# Patient Record
Sex: Female | Born: 1950 | Race: White | Hispanic: No | Smoking: Current every day smoker
Health system: Southern US, Community
[De-identification: ages and names within clinical notes are randomized; demographics above are authoritative.]

## PROBLEM LIST (undated history)

## (undated) DIAGNOSIS — M1990 Unspecified osteoarthritis, unspecified site: Secondary | ICD-10-CM

## (undated) DIAGNOSIS — F988 Other specified behavioral and emotional disorders with onset usually occurring in childhood and adolescence: Secondary | ICD-10-CM

## (undated) DIAGNOSIS — N183 Chronic kidney disease, stage 3 unspecified: Secondary | ICD-10-CM

## (undated) DIAGNOSIS — I10 Essential (primary) hypertension: Secondary | ICD-10-CM

## (undated) DIAGNOSIS — F32A Depression, unspecified: Secondary | ICD-10-CM

## (undated) DIAGNOSIS — R0600 Dyspnea, unspecified: Secondary | ICD-10-CM

## (undated) DIAGNOSIS — M810 Age-related osteoporosis without current pathological fracture: Secondary | ICD-10-CM

## (undated) DIAGNOSIS — IMO0002 Reserved for concepts with insufficient information to code with codable children: Secondary | ICD-10-CM

## (undated) DIAGNOSIS — F329 Major depressive disorder, single episode, unspecified: Secondary | ICD-10-CM

## (undated) DIAGNOSIS — R7303 Prediabetes: Secondary | ICD-10-CM

## (undated) DIAGNOSIS — T7840XA Allergy, unspecified, initial encounter: Secondary | ICD-10-CM

## (undated) DIAGNOSIS — J449 Chronic obstructive pulmonary disease, unspecified: Secondary | ICD-10-CM

## (undated) DIAGNOSIS — E785 Hyperlipidemia, unspecified: Secondary | ICD-10-CM

## (undated) DIAGNOSIS — Z974 Presence of external hearing-aid: Secondary | ICD-10-CM

## (undated) HISTORY — DX: Depression, unspecified: F32.A

## (undated) HISTORY — DX: Chronic obstructive pulmonary disease, unspecified: J44.9

## (undated) HISTORY — DX: Major depressive disorder, single episode, unspecified: F32.9

## (undated) HISTORY — PX: JOINT REPLACEMENT: SHX530

## (undated) HISTORY — DX: Allergy, unspecified, initial encounter: T78.40XA

## (undated) HISTORY — DX: Unspecified osteoarthritis, unspecified site: M19.90

## (undated) HISTORY — DX: Age-related osteoporosis without current pathological fracture: M81.0

## (undated) HISTORY — DX: Hyperlipidemia, unspecified: E78.5

## (undated) HISTORY — PX: MULTIPLE TOOTH EXTRACTIONS: SHX2053

## (undated) HISTORY — DX: Reserved for concepts with insufficient information to code with codable children: IMO0002

---

## 2001-03-10 ENCOUNTER — Emergency Department (HOSPITAL_COMMUNITY): Admission: EM | Admit: 2001-03-10 | Discharge: 2001-03-11 | Payer: Self-pay

## 2001-03-10 ENCOUNTER — Encounter: Payer: Self-pay | Admitting: Emergency Medicine

## 2002-10-11 ENCOUNTER — Encounter: Payer: Self-pay | Admitting: Emergency Medicine

## 2002-10-11 ENCOUNTER — Emergency Department (HOSPITAL_COMMUNITY): Admission: EM | Admit: 2002-10-11 | Discharge: 2002-10-12 | Payer: Self-pay | Admitting: Emergency Medicine

## 2003-11-04 ENCOUNTER — Emergency Department (HOSPITAL_COMMUNITY): Admission: EM | Admit: 2003-11-04 | Discharge: 2003-11-04 | Payer: Self-pay | Admitting: Emergency Medicine

## 2004-09-30 ENCOUNTER — Emergency Department (HOSPITAL_COMMUNITY): Admission: EM | Admit: 2004-09-30 | Discharge: 2004-09-30 | Payer: Self-pay | Admitting: Emergency Medicine

## 2006-04-26 ENCOUNTER — Ambulatory Visit (HOSPITAL_COMMUNITY): Admission: RE | Admit: 2006-04-26 | Discharge: 2006-04-26 | Payer: Self-pay | Admitting: Gastroenterology

## 2011-02-19 ENCOUNTER — Encounter (HOSPITAL_COMMUNITY)
Admission: RE | Admit: 2011-02-19 | Discharge: 2011-02-19 | Disposition: A | Discharge status: Home / Self Care | Payer: BC Managed Care – PPO | Source: Ambulatory Visit | Attending: Orthopedic Surgery | Admitting: Orthopedic Surgery

## 2011-02-19 ENCOUNTER — Ambulatory Visit (HOSPITAL_COMMUNITY)
Admission: RE | Admit: 2011-02-19 | Discharge: 2011-02-19 | Disposition: A | Discharge status: Home / Self Care | Payer: BC Managed Care – PPO | Source: Ambulatory Visit | Attending: Orthopedic Surgery | Admitting: Orthopedic Surgery

## 2011-02-19 ENCOUNTER — Other Ambulatory Visit (HOSPITAL_COMMUNITY): Payer: Self-pay | Admitting: Orthopedic Surgery

## 2011-02-19 DIAGNOSIS — M25569 Pain in unspecified knee: Secondary | ICD-10-CM | POA: Insufficient documentation

## 2011-02-19 DIAGNOSIS — Z01812 Encounter for preprocedural laboratory examination: Secondary | ICD-10-CM | POA: Insufficient documentation

## 2011-02-19 DIAGNOSIS — Z01818 Encounter for other preprocedural examination: Secondary | ICD-10-CM | POA: Insufficient documentation

## 2011-02-19 DIAGNOSIS — M25561 Pain in right knee: Secondary | ICD-10-CM

## 2011-02-19 DIAGNOSIS — Z01810 Encounter for preprocedural cardiovascular examination: Secondary | ICD-10-CM | POA: Insufficient documentation

## 2011-02-19 LAB — CBC
HCT: 40 % (ref 36.0–46.0)
MCH: 31.8 pg (ref 26.0–34.0)
MCV: 90.9 fL (ref 78.0–100.0)
RBC: 4.4 MIL/uL (ref 3.87–5.11)
WBC: 10.6 10*3/uL — ABNORMAL HIGH (ref 4.0–10.5)

## 2011-02-19 LAB — URINALYSIS, ROUTINE W REFLEX MICROSCOPIC
Bilirubin Urine: NEGATIVE
Glucose, UA: NEGATIVE mg/dL
Hgb urine dipstick: NEGATIVE
Ketones, ur: NEGATIVE mg/dL
Nitrite: NEGATIVE
Protein, ur: NEGATIVE mg/dL
Specific Gravity, Urine: 1.026 (ref 1.005–1.030)
Urobilinogen, UA: 0.2 mg/dL (ref 0.0–1.0)
pH: 5.5 (ref 5.0–8.0)

## 2011-02-19 LAB — BASIC METABOLIC PANEL
CO2: 25 mEq/L (ref 19–32)
Chloride: 102 mEq/L (ref 96–112)
Glucose, Bld: 81 mg/dL (ref 70–99)
Potassium: 4.1 mEq/L (ref 3.5–5.1)
Sodium: 139 mEq/L (ref 135–145)

## 2011-02-19 LAB — URINE MICROSCOPIC-ADD ON

## 2011-02-19 LAB — DIFFERENTIAL
Lymphocytes Relative: 24 % (ref 12–46)
Lymphs Abs: 2.6 10*3/uL (ref 0.7–4.0)
Monocytes Relative: 6 % (ref 3–12)
Neutrophils Relative %: 61 % (ref 43–77)

## 2011-02-19 LAB — SURGICAL PCR SCREEN
MRSA, PCR: NEGATIVE
Staphylococcus aureus: POSITIVE — AB

## 2011-02-19 LAB — APTT: aPTT: 32 seconds (ref 24–37)

## 2011-02-20 LAB — TYPE AND SCREEN
ABO/RH(D): O POS
Antibody Screen: NEGATIVE
Unit division: 0

## 2011-02-23 ENCOUNTER — Inpatient Hospital Stay (HOSPITAL_COMMUNITY)
Admission: RE | Admit: 2011-02-23 | Discharge: 2011-02-27 | DRG: 209 | Disposition: A | Discharge status: Home Health | Payer: BC Managed Care – PPO | Source: Ambulatory Visit | Attending: Orthopedic Surgery | Admitting: Orthopedic Surgery

## 2011-02-23 ENCOUNTER — Inpatient Hospital Stay (HOSPITAL_COMMUNITY): Payer: BC Managed Care – PPO

## 2011-02-23 DIAGNOSIS — Z01818 Encounter for other preprocedural examination: Secondary | ICD-10-CM

## 2011-02-23 DIAGNOSIS — E785 Hyperlipidemia, unspecified: Secondary | ICD-10-CM | POA: Diagnosis present

## 2011-02-23 DIAGNOSIS — I10 Essential (primary) hypertension: Secondary | ICD-10-CM | POA: Diagnosis present

## 2011-02-23 DIAGNOSIS — M1710 Unilateral primary osteoarthritis, unspecified knee: Principal | ICD-10-CM | POA: Diagnosis present

## 2011-02-23 DIAGNOSIS — R112 Nausea with vomiting, unspecified: Secondary | ICD-10-CM | POA: Diagnosis not present

## 2011-02-23 DIAGNOSIS — Z01812 Encounter for preprocedural laboratory examination: Secondary | ICD-10-CM

## 2011-02-23 DIAGNOSIS — F17200 Nicotine dependence, unspecified, uncomplicated: Secondary | ICD-10-CM | POA: Diagnosis present

## 2011-02-23 DIAGNOSIS — Z01810 Encounter for preprocedural cardiovascular examination: Secondary | ICD-10-CM

## 2011-02-23 DIAGNOSIS — E876 Hypokalemia: Secondary | ICD-10-CM | POA: Diagnosis not present

## 2011-02-24 LAB — CBC
HCT: 28.3 % — ABNORMAL LOW (ref 36.0–46.0)
Hemoglobin: 9.7 g/dL — ABNORMAL LOW (ref 12.0–15.0)
MCH: 31.1 pg (ref 26.0–34.0)
MCHC: 34.3 g/dL (ref 30.0–36.0)
MCV: 90.7 fL (ref 78.0–100.0)
Platelets: 187 K/uL (ref 150–400)
RBC: 3.12 MIL/uL — ABNORMAL LOW (ref 3.87–5.11)
RDW: 13.4 % (ref 11.5–15.5)
WBC: 7.1 K/uL (ref 4.0–10.5)

## 2011-02-24 LAB — BASIC METABOLIC PANEL WITH GFR
BUN: 15 mg/dL (ref 6–23)
CO2: 30 meq/L (ref 19–32)
Calcium: 7.7 mg/dL — ABNORMAL LOW (ref 8.4–10.5)
Chloride: 101 meq/L (ref 96–112)
Creatinine, Ser: 0.82 mg/dL (ref 0.50–1.10)
GFR calc Af Amer: >60 mL/min
GFR calc non Af Amer: >60 mL/min
Glucose, Bld: 127 mg/dL — ABNORMAL HIGH (ref 70–99)
Potassium: 3.3 meq/L — ABNORMAL LOW (ref 3.5–5.1)
Sodium: 139 meq/L (ref 135–145)

## 2011-02-24 LAB — PROTIME-INR
INR: 1.11 (ref 0.00–1.49)
Prothrombin Time: 14.5 s (ref 11.6–15.2)

## 2011-02-25 LAB — BASIC METABOLIC PANEL
BUN: 9 mg/dL (ref 6–23)
CO2: 32 mEq/L (ref 19–32)
Chloride: 98 mEq/L (ref 96–112)
Creatinine, Ser: 0.67 mg/dL (ref 0.50–1.10)

## 2011-02-25 LAB — CBC
HCT: 27.4 % — ABNORMAL LOW (ref 36.0–46.0)
Hemoglobin: 9.5 g/dL — ABNORMAL LOW (ref 12.0–15.0)
MCH: 31 pg (ref 26.0–34.0)
MCHC: 34.7 g/dL (ref 30.0–36.0)

## 2011-02-27 LAB — PROTIME-INR: Prothrombin Time: 23.3 seconds — ABNORMAL HIGH (ref 11.6–15.2)

## 2011-02-27 LAB — BASIC METABOLIC PANEL
GFR calc Af Amer: >60 mL/min (ref >60)
GFR calc non Af Amer: >60 mL/min (ref >60)
Potassium: 4.6 mEq/L (ref 3.5–5.1)
Sodium: 140 mEq/L (ref 135–145)

## 2011-02-27 LAB — HEMOGLOBIN AND HEMATOCRIT, BLOOD: HCT: 26.5 % — ABNORMAL LOW (ref 36.0–46.0)

## 2011-03-09 NOTE — Discharge Summary (Signed)
  NAMECAYLYN, TEDESCHI NO.:  [id number]  MEDICAL RECORD NO.:  [id number]  LOCATION:  5036                         FACILITY:  [facility]  PHYSICIAN:  Almedia Balls. Ranell Patrick, M.D. DATE OF BIRTH:  Jun 27, 1951  DATE OF ADMISSION:  02/23/2011 DATE OF DISCHARGE:  02/27/2011                              DISCHARGE SUMMARY   ADMISSION DIAGNOSIS:  Right knee end-stage osteoarthritis.  DISCHARGE DIAGNOSIS:  Right knee end-stage osteoarthritis.  BRIEF HISTORY:  The patient is a 60 year old female with worsening right knee pain secondary to end-stage osteoarthritis.  The patient has elected to have a total knee arthroplasty, decreased pain and increased function of that right knee.  PROCEDURE:  The patient had a right total knee arthroplasty by Dr. Malon Kindle on February 23, 2011.  Assistant was [employer], PA-C. General anesthesia was used.  No complications.  HOSPITAL COURSE:  The patient was admitted on February 23, 2011, for the above-stated procedure which she tolerated well.  After adequate time in postanesthesia care unit, she was transferred up to 5000 on postop day 1.  The patient complained of only minimal pain in that right knee, but she did have some moderate nausea.  She did have nausea that lasted for about 2 days, questionable reaction to some of the narcotics versus anesthesia, but on postop day 3 and 4, she was able to work quite well with physical therapy, was moving about quite easily with only minimal- to-no pain in that right knee.  Overall, she was doing great, labs were within acceptable limits, and her wound was healing well.  Overall, her hospital stay was uneventful.  DISCHARGE PLAN:  The patient will be discharged home on February 27, 2011.  CONDITION:  Stable.  DIET:  Regular.  The patient does have allergy to CODEINE.  DISCHARGE MEDICATIONS: 1. Celebrex 200 mg daily. 2. Prozac 20 mg daily. 3. Hydrochlorothiazide 12.5 mg daily. 4. Robaxin 500 mg p.o.  q.6 h. 5. Crestor 20 mg at bedtime. 6. Tramadol 50 mg p.o. b.i.d. p.r.n. 7. Coumadin per pharmacy protocol. 8. Percocet 5/325 one to two tablets q.4-6 h. p.r.n. pain. 9. Trazodone 50 mg at bedtime.  FOLLOWUP:  The patient will follow back up with Dr. Malon Kindle in 2 weeks.     Thomas B. Dixon, P.A.   ______________________________ Almedia Balls. Ranell Patrick, M.D.    TBD/MEDQ  D:  02/27/2011  T:  02/27/2011  Job:  [id number]  Electronically Signed by Standley Dakins P.A. on 03/08/2011 09:03:19 AM Electronically Signed by Malon Kindle  on 03/09/2011 12:08:37 AM

## 2011-03-09 NOTE — Op Note (Signed)
NAMEMARIEL, [name] NO.:  [id number]  MEDICAL RECORD NO.:  [id number]  LOCATION:  5036                         FACILITY:  [facility]  PHYSICIAN:  Almedia Balls. Ranell Patrick, M.D. DATE OF BIRTH:  August 17, 1951  DATE OF PROCEDURE:  02/23/2011 DATE OF DISCHARGE:                              OPERATIVE REPORT   PREOPERATIVE DIAGNOSIS:  Right knee end-stage osteoarthritis.  POSTOPERATIVE DIAGNOSIS:  Right knee end-stage osteoarthritis.  PROCEDURE PERFORMED:  Right total knee replacement using DePuy sigma rotating platform prosthesis.  SURGEON:  Almedia Balls. Ranell Patrick, MD  ASSISTANT:  Donnie Coffin. Dixon, PA-C  ANESTHESIA:  General anesthesia was used plus femoral block.  ESTIMATED BLOOD LOSS:  Minimal.  FLUID REPLACEMENT:  1200 mL crystalloid.  URINE OUTPUT:  50 mL.  INSTRUMENT COUNT:  Correct.  COMPLICATIONS:  None.  Preoperative antibiotics were given.  INDICATIONS:  The patient is a 60 year old female with worsening rightknee pain secondary to knee arthritis.  The patient has had progressive osteoarthritis including medial joint space loss.  The patient has failed conservative management and desires operative treatment to restore function and eliminate pain.  Informed consent obtained.  DESCRIPTION OF PROCEDURE:  After an adequate level of anesthesia was achieved, the patient positioned in the supine position.  The right leg was correctly identified and a nonsterile tourniquet placed in the right proximal thigh.  Right leg was then sterilely prepped and draped in the usual sterile manner.  We exsanguinated the limb using Esmarch bandage and elevated the tourniquet to 300 mmHg.  The midline incision was created with the knee in flexion.  Medial parapatellar arthrotomy was developed and applied with fresh knife.  Patella everted.  The lateral patellofemoral ligaments were divided.  We then entered the distal femur using a step-cut drill.  We then placed a intramedullary  distal femoral resection guide set on 5 degrees right with 10-mm resection.  We resected with an oscillating saw and then sized the femur to size 2 anterior down and then placed our formalin cutting block for anterior- posterior and chamfer cuts.  Once those were done, we resected ACL, PCL and meniscal tissue and this subluxed the tibia anteriorly.  We then cut the tibia 90 degrees perpendicular long axis of tibia with minimal posterior slope and this posterior cruciate substituting prosthesis.  We resected 2-mm off the affected medial side.  Next, we went and checked our gaps which was symmetric at 10-mm.  Posterior bone was resected with 1-inch curved osteotomes.  We next went ahead and finished our tibial preparation with a modular drill and keel punch for a 2.5 tibia and then the femur was prepared with the box cutting guide resecting the box for the posterior cruciate substituting prosthesis.  We then inserted the 2 femur right impacting that position and placed a 12.5 trial and reduced the knee.  We were happy with our soft tissue balance.  We then resurfaced the patella going from a size 22 down to a size 15 patella and then used the 32 patellar button and drilled our lug holes for that and I then placed the 32 patellar trial.  We then arranged the knee and had nice full range of  motion with full extension and normal patellar tracking with no-touch technique.  We then removed all trial components, pulse irrigated the entire knee and then cemented the components into place with DePuy high viscosity cement.  The knee was placed in extension with a trial 12.5 polyethylene while the cement hardened and once the cement was hard, we removed the excess cement with Cornish curved osteotome thoroughly pulse irrigated the knee and then placed a real 12.5 polyethylene insert into position.  This was a size 2 matching the femoral size to reduce the knee and again happy with balanced  soft tissue to get full extension, stable in flexion and extension.  We then irrigated the knee one last time and closed the knee with the knee in slight flexion with #1 Vicryl suture for the parapatellar arthrotomy followed by 2-0 Vicryl subcutaneous closure and 4-0 Monocryl for skin. Steri-Strips applied followed by sterile dressing.  The patient tolerated the surgery well.     Almedia Balls. Ranell Patrick, M.D.     SRN/MEDQ  D:  02/23/2011  T:  02/24/2011  Job:  [id number]  Electronically Signed by Malon Kindle  on 03/09/2011 12:08:34 AM

## 2011-03-09 NOTE — H&P (Signed)
  NAME:  Sheryl [name], Sheryl [name] NO.:  [id number]  MEDICAL RECORD NO.:  [id number]  LOCATION:                                 FACILITY:  PHYSICIAN:  Almedia Balls. Ranell Patrick, M.D. DATE OF BIRTH:  07-05-1951  DATE OF ADMISSION: DATE OF DISCHARGE:                             HISTORY & PHYSICAL   CHIEF COMPLAINT:  Right knee pain.  HISTORY OF PRESENT ILLNESS:  The patient is a 59 year old female with worsening right knee pain secondary to end-stage osteoarthritis.  The patient is elected to have right total knee arthroplasty to decrease pain and increase function.  PAST MEDICAL HISTORY:  Hypertension, bronchitis, and hyperlipidemia.  FAMILY MEDICAL HISTORY:  Negative.  SOCIAL HISTORY:  The patient does not smoke or use alcohol.  The patient is of Dr. Merla Riches.  DRUG ALLERGIES:  None.  CURRENT MEDICATIONS:  Simvastatin, Ultram, and Prozac.  REVIEW OF SYSTEMS:  Pain on range of motion, ambulation with the right knee.  PHYSICAL EXAMINATION:  VITAL SIGNS:  Pulse 70, respirations 16, blood pressure 132/80. GENERAL:  The patient is healthy-appearing 60 year old female, in no acute distress.  Pleasant mood and affect.  Oriented x3. HEAD AND NECK:  Shows cranial nerves II-XII grossly intact.  She has full range of motion in the cervical spine without any tenderness or problems. CHEST:  Active breath sounds bilaterally.  No wheezes, rhonchi, or rales. HEART:  Shows regular rate and rhythm.  No murmur. ABDOMEN:  Nontender, nondistended with active bowel sounds. EXTREMITIES:  Shows moderate tenderness of the right knee, especially medial joint line with mild effusion.  Mildly antalgic gait. Neurovascularly, she is intact.  Distally, no pedal edema or rashes.  X-rays show end-stage osteoarthritis of the right knee.  IMPRESSION:  End-stage osteoarthritis, right knee.  PLAN OF ACTION:  Right total knee arthroplasty by Dr. Malon Kindle.     Thomas B. Dixon,  P.A.   ______________________________ Almedia Balls. Ranell Patrick, M.D.    TBD/MEDQ  D:  02/01/2011  T:  02/02/2011  Job:  [id number]  Electronically Signed by Standley Dakins P.A. on 02/13/2011 08:32:38 AM Electronically Signed by Malon Kindle  on 03/09/2011 12:08:30 AM

## 2011-06-07 ENCOUNTER — Emergency Department (HOSPITAL_COMMUNITY)
Admission: EM | Admit: 2011-06-07 | Discharge: 2011-06-08 | Disposition: A | Discharge status: Home / Self Care | Payer: BC Managed Care – PPO | Attending: Emergency Medicine | Admitting: Emergency Medicine

## 2011-06-07 DIAGNOSIS — T1500XA Foreign body in cornea, unspecified eye, initial encounter: Secondary | ICD-10-CM | POA: Insufficient documentation

## 2011-06-07 DIAGNOSIS — H5710 Ocular pain, unspecified eye: Secondary | ICD-10-CM | POA: Insufficient documentation

## 2011-06-07 DIAGNOSIS — T1590XA Foreign body on external eye, part unspecified, unspecified eye, initial encounter: Secondary | ICD-10-CM | POA: Insufficient documentation

## 2011-08-21 HISTORY — PX: REPLACEMENT TOTAL KNEE: SUR1224

## 2011-08-23 ENCOUNTER — Ambulatory Visit (INDEPENDENT_AMBULATORY_CARE_PROVIDER_SITE_OTHER): Payer: BC Managed Care – PPO

## 2011-08-23 DIAGNOSIS — I10 Essential (primary) hypertension: Secondary | ICD-10-CM

## 2011-08-23 DIAGNOSIS — E785 Hyperlipidemia, unspecified: Secondary | ICD-10-CM

## 2011-08-23 DIAGNOSIS — N390 Urinary tract infection, site not specified: Secondary | ICD-10-CM

## 2011-08-28 ENCOUNTER — Ambulatory Visit (INDEPENDENT_AMBULATORY_CARE_PROVIDER_SITE_OTHER): Payer: BC Managed Care – PPO

## 2011-08-28 DIAGNOSIS — R7401 Elevation of levels of liver transaminase levels: Secondary | ICD-10-CM

## 2011-08-28 DIAGNOSIS — Z139 Encounter for screening, unspecified: Secondary | ICD-10-CM

## 2011-08-28 DIAGNOSIS — R300 Dysuria: Secondary | ICD-10-CM

## 2011-10-31 ENCOUNTER — Other Ambulatory Visit: Payer: Self-pay | Admitting: Internal Medicine

## 2011-11-29 ENCOUNTER — Other Ambulatory Visit: Payer: Self-pay | Admitting: Physician Assistant

## 2012-01-16 ENCOUNTER — Other Ambulatory Visit: Payer: Self-pay | Admitting: Internal Medicine

## 2012-03-07 ENCOUNTER — Other Ambulatory Visit: Payer: Self-pay | Admitting: Family Medicine

## 2012-04-24 ENCOUNTER — Other Ambulatory Visit: Payer: Self-pay | Admitting: Internal Medicine

## 2012-04-24 NOTE — Telephone Encounter (Signed)
Chart pulled to PA pool at nurse station [phone]

## 2012-04-24 NOTE — Telephone Encounter (Signed)
Rx authorized.  Please advise patient that she needs OV for additional refills.

## 2012-04-24 NOTE — Telephone Encounter (Signed)
Please pull chart.

## 2012-04-25 NOTE — Telephone Encounter (Signed)
[facility] notifying patient rx sent in and to RTC for more.

## 2012-04-29 NOTE — Progress Notes (Signed)
Received notice from [employer] that pt's prior auth for pt's Strattera Rx was approved through 04/28/13. Faxed approval notice to [facility]

## 2012-06-04 ENCOUNTER — Ambulatory Visit: Payer: BC Managed Care – PPO

## 2012-06-04 ENCOUNTER — Ambulatory Visit (INDEPENDENT_AMBULATORY_CARE_PROVIDER_SITE_OTHER): Payer: BC Managed Care – PPO | Admitting: Internal Medicine

## 2012-06-04 ENCOUNTER — Other Ambulatory Visit: Payer: Self-pay | Admitting: Family Medicine

## 2012-06-04 ENCOUNTER — Encounter: Payer: Self-pay | Admitting: Internal Medicine

## 2012-06-04 VITALS — BP 110/78 | HR 78 | Temp 98.0°F | Resp 16 | Ht 61.0 in | Wt 139.0 lb

## 2012-06-04 DIAGNOSIS — M25511 Pain in right shoulder: Secondary | ICD-10-CM

## 2012-06-04 DIAGNOSIS — G4700 Insomnia, unspecified: Secondary | ICD-10-CM

## 2012-06-04 DIAGNOSIS — I10 Essential (primary) hypertension: Secondary | ICD-10-CM

## 2012-06-04 DIAGNOSIS — J309 Allergic rhinitis, unspecified: Secondary | ICD-10-CM | POA: Insufficient documentation

## 2012-06-04 DIAGNOSIS — R05 Cough: Secondary | ICD-10-CM

## 2012-06-04 DIAGNOSIS — F32A Depression, unspecified: Secondary | ICD-10-CM | POA: Insufficient documentation

## 2012-06-04 DIAGNOSIS — M79601 Pain in right arm: Secondary | ICD-10-CM

## 2012-06-04 DIAGNOSIS — R059 Cough, unspecified: Secondary | ICD-10-CM

## 2012-06-04 DIAGNOSIS — L259 Unspecified contact dermatitis, unspecified cause: Secondary | ICD-10-CM

## 2012-06-04 DIAGNOSIS — Z202 Contact with and (suspected) exposure to infections with a predominantly sexual mode of transmission: Secondary | ICD-10-CM

## 2012-06-04 DIAGNOSIS — E785 Hyperlipidemia, unspecified: Secondary | ICD-10-CM

## 2012-06-04 DIAGNOSIS — F329 Major depressive disorder, single episode, unspecified: Secondary | ICD-10-CM | POA: Insufficient documentation

## 2012-06-04 DIAGNOSIS — F17200 Nicotine dependence, unspecified, uncomplicated: Secondary | ICD-10-CM

## 2012-06-04 DIAGNOSIS — M79609 Pain in unspecified limb: Secondary | ICD-10-CM

## 2012-06-04 DIAGNOSIS — L309 Dermatitis, unspecified: Secondary | ICD-10-CM

## 2012-06-04 DIAGNOSIS — F988 Other specified behavioral and emotional disorders with onset usually occurring in childhood and adolescence: Secondary | ICD-10-CM | POA: Insufficient documentation

## 2012-06-04 DIAGNOSIS — Z23 Encounter for immunization: Secondary | ICD-10-CM

## 2012-06-04 DIAGNOSIS — F3289 Other specified depressive episodes: Secondary | ICD-10-CM

## 2012-06-04 LAB — CBC WITH DIFFERENTIAL/PLATELET
Basophils Relative: 1 % (ref 0–1)
Eosinophils Absolute: 0.6 10*3/uL (ref 0.0–0.7)
Eosinophils Relative: 11 % — ABNORMAL HIGH (ref 0–5)
Lymphs Abs: 1.7 10*3/uL (ref 0.7–4.0)
MCH: 30.9 pg (ref 26.0–34.0)
MCHC: 33.3 g/dL (ref 30.0–36.0)
MCV: 92.6 fL (ref 78.0–100.0)
Monocytes Relative: 4 % (ref 3–12)
Neutrophils Relative %: 54 % (ref 43–77)
Platelets: 323 10*3/uL (ref 150–400)
RBC: 4.18 MIL/uL (ref 3.87–5.11)

## 2012-06-04 LAB — LIPID PANEL
Cholesterol: 166 mg/dL (ref 0–200)
HDL: 44 mg/dL (ref >39)
LDL Cholesterol: 100 mg/dL — ABNORMAL HIGH (ref 0–99)
Total CHOL/HDL Ratio: 3.8 Ratio
Triglycerides: 108 mg/dL (ref <150)
VLDL: 22 mg/dL (ref 0–40)

## 2012-06-04 LAB — COMPREHENSIVE METABOLIC PANEL
Alkaline Phosphatase: 135 U/L — ABNORMAL HIGH (ref 39–117)
CO2: 28 mEq/L (ref 19–32)
Creat: 1.02 mg/dL (ref 0.50–1.10)
Glucose, Bld: 92 mg/dL (ref 70–99)
Sodium: 141 mEq/L (ref 135–145)
Total Bilirubin: 0.4 mg/dL (ref 0.3–1.2)

## 2012-06-04 MED ORDER — CLOBETASOL PROPIONATE 0.05 % EX CREA: TOPICAL_CREAM | Freq: Two times a day (BID) | CUTANEOUS | Status: DC

## 2012-06-04 MED ORDER — LOSARTAN POTASSIUM-HCTZ 100-12.5 MG PO TABS: 1.0000 | ORAL_TABLET | Freq: Every day | ORAL | Status: DC

## 2012-06-04 MED ORDER — MELOXICAM 15 MG PO TABS: 15.0000 mg | ORAL_TABLET | Freq: Every day | ORAL | Status: DC

## 2012-06-04 MED ORDER — TRAZODONE HCL 50 MG PO TABS
50.0000 mg | ORAL_TABLET | Freq: Every day | ORAL | Status: DC
Start: 2012-06-04 — End: 2013-07-19

## 2012-06-04 MED ORDER — ATOMOXETINE HCL 100 MG PO CAPS: 100.0000 mg | ORAL_CAPSULE | Freq: Every day | ORAL | Status: DC

## 2012-06-04 MED ORDER — ATORVASTATIN CALCIUM 40 MG PO TABS: 40.0000 mg | ORAL_TABLET | Freq: Every day | ORAL | Status: DC

## 2012-06-04 MED ORDER — FLUOXETINE HCL 40 MG PO CAPS: 40.0000 mg | ORAL_CAPSULE | Freq: Every day | ORAL | Status: DC

## 2012-06-04 NOTE — Patient Instructions (Addendum)
[facility]---google for glucosamine Valerian root for sleep Exercises for shoulder Naphcon or vasocon for eyes

## 2012-06-04 NOTE — Progress Notes (Deleted)
  Subjective:    Patient ID: Sheryl [name], female    DOB: 22-Apr-1951, 61 y.o.   MRN: [id number]  HPI    Review of Systems     Objective:   Physical Exam        Assessment & Plan:

## 2012-06-04 NOTE — Progress Notes (Signed)
Subjective:    Patient ID: Sheryl [name], female    DOB: 03/21/1951, 61 y.o.   MRN: [id number]  CC: 61 yo WF presents to office for refills on meds.  HPI The pt. Has several complaints today in addition to needing refills.   She would like to quit smoking and has a chronic cough.  She has cut down from 1 ppd to 1/2 ppd and purchased an electronic cigarette.  She has family members who quit smoking and are encouraging her to do the same.  She says that she "would like to be done with it."  In the past she tried Chantix.  It caused her to have nausea and weird dreams.  She says the dreams are tolerable, but the nausea was because she was not drinking enough water.  She requests a pap smear and STI testing.  She was here in the spring and tested positive for chlamydia.  She finished treatment, but since has had new sexual partners and does not use protection.  She does not have any symptoms that are concerning, but would like testing to confirm. Her last pap smear was in 2008.  She has never had abnormal pap smears.  She is postmenopausal.  She c/o chronic R arm pain that has not resolved over the past 1 yr.  She describes the pain as "sore," worse with movement (especially when stretching her R arm across her chest,) and when laying on her R side.  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    Pt. Stated that overall she is doing well.  She has had a few crying spells over the past couple of days since running out of her Prozac last week.  However, she expects to return to her normal mood after restarting the medicine.  She is enjoying her new partner, he is fun and treats her well.    PMHX:   Patient Active Problem List  Diagnosis  . HTN (hypertension)  . Hyperlipidemia  . Depression  . ADD (attention deficit disorder)  . AR (allergic rhinitis)   SxHx: R knee sx w/in past yr.  Review of Systems Positives: General: Cold feet//trouble staying  asleep HEENT: Dry mouth, dry eyes, seasonal allergies w/ congestion  Respiratory: Chronic cough MSK: Arthritis GI: Occasional stress incontinence     Objective:   Physical Exam General: 61 yo WF oriented and cooperative Vitals:  Filed Vitals:   06/04/12 1046  BP: 110/78  Pulse: 78  Temp: 98 F (36.7 C)  Resp: 16  HEENT: nontraumatic, EOMIT, erythema in ear canal, nasal turbinates pink, anterior chain LN, thyroid without nodules, trachea midline Heart: RRR/no m Lungs: Hyperresonant lung sounds, no wheezing Abdomen: full contour, BS present, nontender/soft to palpation MSK: normal bulk and tone.  R UE: Full ROM throughout w/pain at insertion of SITS's m. on shoulder ADDuction and end point during passive ABduction, tender to palpation at insertion of SITS  M., strength 5/5  Neuro: alert, oriented, CN II-XII grossly IT Skin: several 2x2cm areas of excoriated eczema on R LE Breast:No masses Pelvic: Ext. Genitalia-normal to inspection w/o lesions, Vagina-pink, moist, Cervix: pink, no lesions, OS friable,  Bimanual Exam-uterus small/NT, no adnexal masses or tenderness   IMAGES: CXR R Shoulder XR  [facility] reading (PRIMARY) by  Dr. 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 w/Chronic Cough  10) Exposure to STD   Plan:  She will schedule mammogram and consider DEXA scan 1)*D/C Celebrex and started Mobic due to expense  Meds ordered this encounter  Medications  . DISCONTD: FLUoxetine (PROZAC) 40 MG capsule    Sig: Take 40 mg by mouth daily.  . celecoxib (CELEBREX) 200 MG capsule    Sig: Take 200 mg by mouth 2 (two) times daily.  Marland Kitchen DISCONTD: clobetasol cream (TEMOVATE) 0.05 %    Sig: Apply  topically 2 (two) times daily.    Dispense:  30 g    Refill:  0  . traZODone (DESYREL) 50 MG tablet    Sig: Take 1 tablet (50 mg total) by mouth at bedtime.    Dispense:  90 tablet    Refill:  3  . losartan-hydrochlorothiazide (HYZAAR) 100-12.5 MG per tablet    Sig: Take 1 tablet by mouth daily.    Dispense:  90 tablet    Refill:  3  . FLUoxetine (PROZAC) 40 MG capsule    Sig: Take 1 capsule (40 mg total) by mouth daily.    Dispense:  90 capsule    Refill:  3  . clobetasol cream (TEMOVATE) 0.05 %    Sig: Apply topically 2 (two) times daily.    Dispense:  30 g    Refill:  5  . atorvastatin (LIPITOR) 40 MG tablet    Sig: Take 1 tablet (40 mg total) by mouth daily.    Dispense:  90 tablet    Refill:  1  . atomoxetine (STRATTERA) 100 MG capsule    Sig: Take 1 capsule (100 mg total) by mouth daily.    Dispense:  30 capsule    Refill:  11    PT IS OUT OF REFILLS  . meloxicam (MOBIC) 15 MG tablet    Sig: Take 1 tablet (15 mg total) by mouth daily.    Dispense:  90 tablet    Refill:  3   2) Labs: CBC, CMP, Lipid panel, TSH 3) Pap smear w/ HPV, Chlamydia, and GC testing 4) CXR 5) R shoulder XR 6) R shoulder exercises and counseling regarding herbal supplementation for jt.Discussed tumeric and cherry juice extract 7) Flu vaccine 8) DISCUSSED SMOKING CESSATION OPTIONS -will reinforce with letter Office visit 50 minutes  Addendum 10/18-labs Results for orders placed in visit on 06/04/12  CBC WITH DIFFERENTIAL      Component Value Range   WBC 5.8  4.0 - 10.5 K/uL   RBC 4.18  3.87 - 5.11 MIL/uL   Hemoglobin 12.9  12.0 - 15.0 g/dL   HCT 78.2  95.6 - 21.3 %   MCV 92.6  78.0 - 100.0 fL   MCH 30.9  26.0 - 34.0 pg   MCHC 33.3  30.0 - 36.0 g/dL   RDW 08.6  57.8 - 46.9 %   Platelets 323  150 - 400 K/uL   Neutrophils Relative 54  43 - 77 %   Neutro Abs 3.2  1.7 - 7.7 K/uL   Lymphocytes Relative 30  12 - 46 %   Lymphs Abs 1.7  0.7 - 4.0 K/uL   Monocytes Relative 4  3 - 12 %    Monocytes Absolute 0.3  0.1 - 1.0 K/uL   Eosinophils Relative 11 (*) 0 - 5 %   Eosinophils Absolute 0.6  0.0 - 0.7 K/uL   Basophils Relative 1  0 - 1 %   Basophils Absolute 0.1  0.0 - 0.1 K/uL   Smear Review Criteria for review not met    COMPREHENSIVE METABOLIC PANEL      Component Value Range   Sodium 141  135 - 145 mEq/L   Potassium 4.6  3.5 - 5.3 mEq/L   Chloride 108  96 - 112 mEq/L   CO2 28  19 - 32 mEq/L   Glucose, Bld 92  70 - 99 mg/dL   BUN 17  6 - 23 mg/dL   Creat 1.61  0.96 - 0.45 mg/dL   Total Bilirubin 0.4  0.3 - 1.2 mg/dL   Alkaline Phosphatase 135 (*) 39 - 117 U/L   AST 26  0 - 37 U/L   ALT 31  0 - 35 U/L   Total Protein 6.9  6.0 - 8.3 g/dL   Albumin 4.0  3.5 - 5.2 g/dL   Calcium 9.4  8.4 - 40.9 mg/dL  LIPID PANEL      Component Value Range   Cholesterol 166  0 - 200 mg/dL   Triglycerides 811  <914 mg/dL   HDL 44  >78 mg/dL   Total CHOL/HDL Ratio 3.8     VLDL 22  0 - 40 mg/dL   LDL Cholesterol 295 (*) 0 - 99 mg/dL  TSH      Component Value Range   TSH 0.731  0.350 - 4.500 uIU/mL  PAP IG, CT-NG NAA, HPV HIGH-RISK      Component Value Range   HPV DNA High Risk       Specimen adequacy:       FINAL DIAGNOSIS:       COMMENTS:       RECOMMENDATIONS:       Cytotechnologist:       QC Cytotechnologist:       Pathologist:       Chlamydia Probe Amp NEGATIVE     GC Probe Amp NEGATIVE     Awaiting rest of PAP/She needs Pap smear with HPV testing every 5 years in her age group

## 2012-06-06 ENCOUNTER — Encounter: Payer: Self-pay | Admitting: Internal Medicine

## 2012-06-06 DIAGNOSIS — F17200 Nicotine dependence, unspecified, uncomplicated: Secondary | ICD-10-CM | POA: Insufficient documentation

## 2012-06-06 LAB — PAP IG, CT-NG NAA, HPV HIGH-RISK
Chlamydia Probe Amp: NEGATIVE
GC Probe Amp: NEGATIVE

## 2012-06-16 ENCOUNTER — Encounter: Payer: Self-pay | Admitting: Internal Medicine

## 2012-12-31 ENCOUNTER — Ambulatory Visit (INDEPENDENT_AMBULATORY_CARE_PROVIDER_SITE_OTHER): Payer: BC Managed Care – PPO | Admitting: Family Medicine

## 2012-12-31 ENCOUNTER — Ambulatory Visit: Payer: BC Managed Care – PPO

## 2012-12-31 DIAGNOSIS — R031 Nonspecific low blood-pressure reading: Secondary | ICD-10-CM

## 2012-12-31 DIAGNOSIS — M1990 Unspecified osteoarthritis, unspecified site: Secondary | ICD-10-CM

## 2012-12-31 DIAGNOSIS — IMO0002 Reserved for concepts with insufficient information to code with codable children: Secondary | ICD-10-CM

## 2012-12-31 DIAGNOSIS — I10 Essential (primary) hypertension: Secondary | ICD-10-CM

## 2012-12-31 DIAGNOSIS — M79609 Pain in unspecified limb: Secondary | ICD-10-CM

## 2012-12-31 MED ORDER — TRAMADOL HCL 50 MG PO TABS: 50.0000 mg | ORAL_TABLET | Freq: Three times a day (TID) | ORAL | Status: DC | PRN

## 2012-12-31 NOTE — Progress Notes (Signed)
Subjective: Patient slipped or tripped in her yard last night and fell landing on her left shoulder somehow. She does not seem to know exactly how she hit, but she had acute pain in the left upper arm just below the shoulder. She continues to have pain especially in the posterior aspect of the arm in the deltoid region.  Patient is concerned about her low blood pressure today. We discussed the medication she is on.  She also complains of generalized arthritis. She says the meloxicam   Objective: Arm is tender below the shoulder. Shoulder has good range of motion when moved passively, but when she actively tries to move it hurts. Grip is good. Pulses good. Neurovascular intact.  [facility] reading (PRIMARY) by  Dr. Alwyn Ren Normal upper arm and shoulder.    Assessment: Strain left shoulder Low blood pressure and osteoarthritis  Plan: Anti-inflammatory medication, ice, time Take Tylenol for the pain in addition to the meloxicam Cut her blood pressure pill in half.

## 2012-12-31 NOTE — Patient Instructions (Signed)
Continue the meloxicam. You are on the maximum dose of that.  Take Tylenol 500 mg 2 tablets twice daily for your arthritis pain in addition to the meloxicam  Use ice on the shoulder.  Cut blood pressure pills in half and take one half daily  Drink plenty of fluids  Monitor your blood pressure, if it continues to stay low or if it starts going to high come back in for recheck.

## 2013-02-07 ENCOUNTER — Other Ambulatory Visit: Payer: Self-pay | Admitting: Internal Medicine

## 2013-05-06 NOTE — Progress Notes (Signed)
PA approved for Straterra through 05/06/14. Notified pharmacy.

## 2013-05-24 ENCOUNTER — Other Ambulatory Visit: Payer: Self-pay | Admitting: Internal Medicine

## 2013-06-10 ENCOUNTER — Other Ambulatory Visit: Payer: Self-pay | Admitting: Internal Medicine

## 2013-07-04 ENCOUNTER — Other Ambulatory Visit: Payer: Self-pay | Admitting: Internal Medicine

## 2013-07-19 ENCOUNTER — Ambulatory Visit (INDEPENDENT_AMBULATORY_CARE_PROVIDER_SITE_OTHER): Payer: BC Managed Care – PPO | Admitting: Internal Medicine

## 2013-07-19 VITALS — BP 122/70 | HR 92 | Temp 98.3°F | Resp 18 | Ht 61.0 in | Wt 139.0 lb

## 2013-07-19 DIAGNOSIS — F329 Major depressive disorder, single episode, unspecified: Secondary | ICD-10-CM

## 2013-07-19 DIAGNOSIS — F988 Other specified behavioral and emotional disorders with onset usually occurring in childhood and adolescence: Secondary | ICD-10-CM

## 2013-07-19 DIAGNOSIS — M25562 Pain in left knee: Secondary | ICD-10-CM

## 2013-07-19 DIAGNOSIS — E785 Hyperlipidemia, unspecified: Secondary | ICD-10-CM

## 2013-07-19 DIAGNOSIS — G4700 Insomnia, unspecified: Secondary | ICD-10-CM

## 2013-07-19 DIAGNOSIS — I10 Essential (primary) hypertension: Secondary | ICD-10-CM

## 2013-07-19 DIAGNOSIS — F17200 Nicotine dependence, unspecified, uncomplicated: Secondary | ICD-10-CM

## 2013-07-19 DIAGNOSIS — Z23 Encounter for immunization: Secondary | ICD-10-CM

## 2013-07-19 DIAGNOSIS — F32A Depression, unspecified: Secondary | ICD-10-CM

## 2013-07-19 LAB — POCT CBC
Hemoglobin: 13.2 g/dL (ref 12.2–16.2)
MCH, POC: 30.5 pg (ref 27–31.2)
MCV: 98.2 fL — AB (ref 80–97)
MID (cbc): 1 — AB (ref 0–0.9)
Platelet Count, POC: 267 10*3/uL (ref 142–424)
RBC: 4.33 M/uL (ref 4.04–5.48)
WBC: 9.3 10*3/uL (ref 4.6–10.2)

## 2013-07-19 LAB — COMPREHENSIVE METABOLIC PANEL
BUN: 14 mg/dL (ref 6–23)
CO2: 27 mEq/L (ref 19–32)
Calcium: 9.6 mg/dL (ref 8.4–10.5)
Chloride: 103 mEq/L (ref 96–112)
Creat: 1.04 mg/dL (ref 0.50–1.10)
Glucose, Bld: 92 mg/dL (ref 70–99)

## 2013-07-19 LAB — LIPID PANEL
Cholesterol: 180 mg/dL (ref 0–200)
HDL: 56 mg/dL (ref >39)
Total CHOL/HDL Ratio: 3.2 Ratio
Triglycerides: 97 mg/dL (ref <150)

## 2013-07-19 MED ORDER — LOSARTAN POTASSIUM-HCTZ 100-12.5 MG PO TABS: 1.0000 | ORAL_TABLET | Freq: Every day | ORAL | Status: DC

## 2013-07-19 MED ORDER — ZOSTER VACCINE LIVE 19400 UNT/0.65ML ~~LOC~~ SOLR: 0.6500 mL | Freq: Once | SUBCUTANEOUS | Status: DC

## 2013-07-19 MED ORDER — ATOMOXETINE HCL 100 MG PO CAPS: ORAL_CAPSULE | ORAL | Status: DC

## 2013-07-19 MED ORDER — MELOXICAM 15 MG PO TABS: ORAL_TABLET | ORAL | Status: DC

## 2013-07-19 MED ORDER — ATORVASTATIN CALCIUM 40 MG PO TABS: ORAL_TABLET | ORAL | Status: DC

## 2013-07-19 MED ORDER — FLUOXETINE HCL 40 MG PO CAPS: 40.0000 mg | ORAL_CAPSULE | Freq: Every day | ORAL | Status: DC

## 2013-07-19 MED ORDER — TRAZODONE HCL 50 MG PO TABS: 50.0000 mg | ORAL_TABLET | Freq: Every day | ORAL | Status: DC

## 2013-07-19 NOTE — Progress Notes (Addendum)
Subjective:    Patient ID: Sheryl [name], female    DOB: 1950-09-12, 62 y.o.   MRN: [id number]  This chart was scribed for Ellamae Sia, MD by Greggory Stallion, Medical Scribe. This patient's care was started at 10:31 AM.  HPI HPI Comments: Sheryl [name] is a 62 y.o. female who presents to the office complaining of a fall that occurred yesterday. She states she tripped over concrete blocks and landed on her left knee. Pt has sudden onset, constant left knee pain and an abrasion over her left knee. Pain radiates into her left thigh. Bearing weight and straightening her knee worsen the pain.   Pt states she also needs refills on her medications and her cholesterol checked. She also needs the flu and pneumonia vaccinations. Pt states she is still smoking about 12 cigarettes daily.   Patient Active Problem List   Diagnosis Date Noted  . Nicotine addiction---at 12 cigs a day//has quit before /chantix no help//would like to quit 06/06/2012  . HTN (hypertension) 06/04/2012  . Hyperlipidemia---no wt loss 06/04/2012  . Depression---stable on meds 06/04/2012  . ADD (attention deficit disorder)---stable on strattera 06/04/2012  . AR (allergic rhinitis) 06/04/2012   Past Medical History  Diagnosis Date  . Allergy   . Arthritis   . Osteoporosis    Past Surgical History  Procedure Laterality Date  . Joint replacement R Knee     No Known Allergies Prior to Admission medications   Medication Sig Start Date End Date Taking? Authorizing Provider  atorvastatin (LIPITOR) 40 MG tablet TAKE 1 TABLET EVERY DAY 02/07/13  Yes Godfrey Pick, PA-C  celecoxib (CELEBREX) 200 MG capsule Take 200 mg by mouth 2 (two) times daily.   Yes Historical Provider, MD  clobetasol cream (TEMOVATE) 0.05 % Apply topically 2 (two) times daily. 06/04/12  Yes Tonye Pearson, MD  FLUoxetine (PROZAC) 40 MG capsule Take 1 capsule (40 mg total) by mouth daily. PATIENT NEEDS OFFICE VISIT FOR ADDITIONAL REFILLS  05/24/13  Yes Tonye Pearson, MD  losartan-hydrochlorothiazide [facility]) 100-12.5 MG per tablet Take 1 tablet by mouth daily. PATIENT NEEDS OFFICE VISIT FOR ADDITIONAL REFILLS 06/10/13  Yes Godfrey Pick, PA-C  meloxicam (MOBIC) 15 MG tablet TAKE 1 TABLET EVERY DAY 07/04/13  Yes Godfrey Pick, PA-C  STRATTERA 100 MG capsule TAKE ONE CAPSULE EVERY DAY 07/04/13  Yes Eleanore Delia Chimes, PA-C  traMADol (ULTRAM) 50 MG tablet Take 1 tablet (50 mg total) by mouth every 8 (eight) hours as needed for pain. 12/31/12  Yes Peyton Najjar, MD  traZODone (DESYREL) 50 MG tablet Take 1 tablet (50 mg total) by mouth at bedtime. 06/04/12  Yes Tonye Pearson, MD   History   Social History  . Marital Status: Divorced    Spouse Name: N/A    Number of Children: N/A  . Years of Education: N/A   Occupational History  . Not on file.   Social History Main Topics  . Smoking status: Current Every Day Smoker  . Smokeless tobacco: Not on file  . Alcohol Use: No  . Drug Use: No  . Sexual Activity: Yes   Other Topics Concern  . Not on file   Social History Narrative  . No narrative on file    Review of Systems  Constitutional: Negative for fever, activity change, appetite change, fatigue and unexpected weight change.  Eyes: Negative for visual disturbance.  Respiratory: Negative for shortness of breath.   Cardiovascular: Negative for chest pain,  palpitations and leg swelling.  Gastrointestinal: Negative for abdominal pain.  Musculoskeletal: Positive for arthralgias and myalgias.  Skin: Positive for wound.  Neurological: Negative for headaches.  Psychiatric/Behavioral: Negative for behavioral problems, sleep disturbance and dysphoric mood.       Objective:   Physical Exam  Vitals reviewed. Constitutional: She is oriented to person, place, and time. She appears well-developed and well-nourished. No distress.  HENT:  Head: Normocephalic and atraumatic.  Eyes: EOM are normal. Pupils are equal,  round, and reactive to light.  Neck: Neck supple. No thyromegaly present.  Cardiovascular: Normal rate, regular rhythm and normal heart sounds.   No murmur heard. Pulmonary/Chest: Effort normal and breath sounds normal. No respiratory distress. She has no wheezes. She has no rales.  Musculoskeletal: Normal range of motion. She exhibits no edema.  Left knee mildly swollen without effusion. Tender on medial collateral ligament with palpation and stress. Tender in upper gastroc without a defect. Tender in distal posterior thigh. Abrasion over lateral patella which moves freely without pain. Drawer negative.   Lymphadenopathy:    She has no cervical adenopathy.  Neurological: She is alert and oriented to person, place, and time.  Skin: Skin is warm and dry.  Psychiatric: She has a normal mood and affect. Her behavior is normal.    Filed Vitals:   07/19/13 1000  BP: 122/70  Pulse: 92  Temp: 98.3 F (36.8 C)  TempSrc: Oral  Resp: 18  Height: 5\' 1"  (1.549 m)  Weight: 139 lb (63.05 kg)  SpO2: 98%       Assessment & Plan:  10:43 AM-Discussed treatment plan which includes Tylenol and ice with pt at bedside and pt agreed to plan. Will also do blood work.    I have completed the patient encounter in its entirety as documented by the scribe, with editing by me where necessary. Kendall Arnell P. Merla Riches, M.D. Hyperlipidemia - Plan: POCT CBC, Comprehensive metabolic panel, Lipid panel  HTN (hypertension) - Plan: POCT CBC, Comprehensive metabolic panel, Lipid panel  Nicotine addiction--5 min disc grad cessation with subst gum  Depression-stable  ADD (attention deficit disorder)-stable  Insomnia - Plan: traZODone (DESYREL) 50 MG tablet  Knee pain abrasion/MCL startin/gastroc str   ice/stretch/slow Incr ambul  Meds ordered this encounter  Medications  . atorvastatin (LIPITOR) 40 MG tablet    Sig: TAKE 1 TABLET EVERY DAY    Dispense:  90 tablet    Refill:  3  . FLUoxetine (PROZAC) 40 MG  capsule    Sig: Take 1 capsule (40 mg total) by mouth daily.    Dispense:  90 capsule    Refill:  3  . losartan-hydrochlorothiazide (HYZAAR) 100-12.5 MG per tablet    Sig: Take 1 tablet by mouth daily.    Dispense:  90 tablet    Refill:  3  . meloxicam (MOBIC) 15 MG tablet    Sig: TAKE 1 TABLET EVERY DAY    Dispense:  90 tablet    Refill:  3  . atomoxetine (STRATTERA) 100 MG capsule    Sig: TAKE ONE CAPSULE EVERY DAY    Dispense:  90 capsule    Refill:  3  . traZODone (DESYREL) 50 MG tablet    Sig: Take 1 tablet (50 mg total) by mouth at bedtime.    Dispense:  90 tablet    Refill:  3  . zoster vaccine live, PF, (ZOSTAVAX) 78295 UNT/0.65ML injection    Sig: Inject 19,400 Units into the skin once. Administer at pharmacy  Dispense:  1 each    Refill:  0   Routine labs

## 2013-07-24 ENCOUNTER — Encounter: Payer: Self-pay | Admitting: Internal Medicine

## 2013-08-11 ENCOUNTER — Ambulatory Visit: Payer: BC Managed Care – PPO

## 2013-08-11 ENCOUNTER — Ambulatory Visit (INDEPENDENT_AMBULATORY_CARE_PROVIDER_SITE_OTHER): Payer: BC Managed Care – PPO | Admitting: Family Medicine

## 2013-08-11 VITALS — BP 110/66 | HR 94 | Temp 98.0°F | Resp 18 | Ht 61.0 in | Wt 138.0 lb

## 2013-08-11 DIAGNOSIS — J029 Acute pharyngitis, unspecified: Secondary | ICD-10-CM

## 2013-08-11 DIAGNOSIS — J019 Acute sinusitis, unspecified: Secondary | ICD-10-CM

## 2013-08-11 DIAGNOSIS — R059 Cough, unspecified: Secondary | ICD-10-CM

## 2013-08-11 DIAGNOSIS — R52 Pain, unspecified: Secondary | ICD-10-CM

## 2013-08-11 DIAGNOSIS — R05 Cough: Secondary | ICD-10-CM

## 2013-08-11 LAB — POCT RAPID STREP A (OFFICE): Rapid Strep A Screen: NEGATIVE

## 2013-08-11 LAB — POCT INFLUENZA A/B
Influenza A, POC: NEGATIVE
Influenza B, POC: NEGATIVE

## 2013-08-11 MED ORDER — HYDROCODONE-HOMATROPINE 5-1.5 MG/5ML PO SYRP: 5.0000 mL | ORAL_SOLUTION | Freq: Every evening | ORAL | Status: DC | PRN

## 2013-08-11 MED ORDER — BENZONATATE 100 MG PO CAPS: 200.0000 mg | ORAL_CAPSULE | Freq: Two times a day (BID) | ORAL | Status: DC | PRN

## 2013-08-11 MED ORDER — AMOXICILLIN-POT CLAVULANATE 875-125 MG PO TABS: 1.0000 | ORAL_TABLET | Freq: Two times a day (BID) | ORAL | Status: DC

## 2013-08-11 MED ORDER — FLUTICASONE PROPIONATE 50 MCG/ACT NA SUSP: 2.0000 | Freq: Every day | NASAL | Status: DC

## 2013-08-11 NOTE — Patient Instructions (Signed)

## 2013-08-11 NOTE — Progress Notes (Signed)
Chief Complaint:  Chief Complaint  Patient presents with  . Sore Throat    x2 days   . right eye was matted    x2 days   . Plantar Fasciitis    HPI: Sheryl [name] is a 62 y.o. female who is here for a sore throat that she has been having since yesterday. She is  having trouble with losing her voice. It hurts to swallow. Pt states that her right ear feels like it is stopped up. She woke up this morning and her right eye was closed shut until she washed her face. Nasal congestion and productive cough. She took Dayquil with some relief. She has had the flu vaccine She is a smoker  Past Medical History  Diagnosis Date  . Allergy   . Arthritis   . Osteoporosis    Past Surgical History  Procedure Laterality Date  . Joint replacement     History   Social History  . Marital Status: Divorced    Spouse Name: N/A    Number of Children: N/A  . Years of Education: N/A   Social History Main Topics  . Smoking status: Current Every Day Smoker  . Smokeless tobacco: None  . Alcohol Use: No  . Drug Use: No  . Sexual Activity: Yes   Other Topics Concern  . None   Social History Narrative  . None   History reviewed. No pertinent family history. No Known Allergies Prior to Admission medications   Medication Sig Start Date End Date Taking? Authorizing Provider  atomoxetine (STRATTERA) 100 MG capsule TAKE ONE CAPSULE EVERY DAY 07/19/13  Yes Tonye Pearson, MD  atorvastatin (LIPITOR) 40 MG tablet TAKE 1 TABLET EVERY DAY 07/19/13  Yes Tonye Pearson, MD  clobetasol cream (TEMOVATE) 0.05 % Apply topically 2 (two) times daily. 06/04/12  Yes Tonye Pearson, MD  FLUoxetine (PROZAC) 40 MG capsule Take 1 capsule (40 mg total) by mouth daily. 07/19/13  Yes Tonye Pearson, MD  losartan-hydrochlorothiazide (HYZAAR) 100-12.5 MG per tablet Take 1 tablet by mouth daily. 07/19/13  Yes Tonye Pearson, MD  meloxicam (MOBIC) 15 MG tablet TAKE 1 TABLET EVERY DAY 07/19/13   Yes Tonye Pearson, MD  traZODone (DESYREL) 50 MG tablet Take 1 tablet (50 mg total) by mouth at bedtime. 07/19/13  Yes Tonye Pearson, MD  zoster vaccine live, PF, (ZOSTAVAX) [id number] UNT/0.65ML injection Inject 19,400 Units into the skin once. Administer at pharmacy 07/19/13   Tonye Pearson, MD     ROS: The patient denies fevers, chills, night sweats, unintentional weight loss, chest pain, palpitations, wheezing, dyspnea on exertion, nausea, vomiting, abdominal pain, dysuria, hematuria, melena, numbness, weakness, or tingling.   All other systems have been reviewed and were otherwise negative with the exception of those mentioned in the HPI and as above.    PHYSICAL EXAM: Filed Vitals:   08/11/13 1659  BP: 110/66  Pulse: 94  Temp: 98 F (36.7 C)  Resp: 18   Filed Vitals:   08/11/13 1659  Height: 5\' 1"  (1.549 m)  Weight: 138 lb (62.596 kg)   Body mass index is 26.09 kg/(m^2).  General: Alert, no acute distress HEENT:  Normocephalic, atraumatic, oropharynx patent. EOMI, PERRLA, TM nl , + sinus tenderness, erythematous throat Cardiovascular:  Regular rate and rhythm, no rubs murmurs or gallops.  No Carotid bruits, radial pulse intact. No pedal edema.  Respiratory: Clear to auscultation bilaterally.  No wheezes, rales, or rhonchi.  No cyanosis, no use of accessory musculature GI: No organomegaly, abdomen is soft and non-tender, positive bowel sounds.  No masses. Skin: No rashes. Neurologic: Facial musculature symmetric. Psychiatric: Patient is appropriate throughout our interaction. Lymphatic: No cervical lymphadenopathy Musculoskeletal: Gait intact.   LABS: Results for orders placed in visit on 08/11/13  POCT INFLUENZA A/B      Result Value Range   Influenza A, POC Negative     Influenza B, POC Negative    POCT RAPID STREP A (OFFICE)      Result Value Range   Rapid Strep A Screen Negative  Negative     EKG/XRAY:   Primary read interpreted by Dr. Conley Rolls at  [facility]   ASSESSMENT/PLAN: Encounter Diagnoses  Name Primary?  . Acute pharyngitis Yes  . Body aches   . Cough   . Acute sinusitis     Rx Augmentin, flonase and also tessaolon perles. Try sxs treatment first and if no response then will  Gross sideeffects, risk and benefits, and alternatives of medications d/w patient. Patient is aware that all medications have potential sideeffects and we are unable to predict every sideeffect or drug-drug interaction that may occur.  Hamilton Capri PHUONG, DO 08/11/2013 6:59 PM

## 2013-08-14 ENCOUNTER — Other Ambulatory Visit: Payer: Self-pay | Admitting: Internal Medicine

## 2013-08-27 ENCOUNTER — Ambulatory Visit: Payer: BC Managed Care – PPO

## 2013-08-27 ENCOUNTER — Ambulatory Visit (INDEPENDENT_AMBULATORY_CARE_PROVIDER_SITE_OTHER): Payer: BC Managed Care – PPO | Admitting: Family Medicine

## 2013-08-27 VITALS — BP 120/62 | HR 89 | Temp 97.5°F | Resp 16 | Ht 61.0 in | Wt 139.4 lb

## 2013-08-27 DIAGNOSIS — R05 Cough: Secondary | ICD-10-CM

## 2013-08-27 DIAGNOSIS — R059 Cough, unspecified: Secondary | ICD-10-CM

## 2013-08-27 DIAGNOSIS — J019 Acute sinusitis, unspecified: Secondary | ICD-10-CM

## 2013-08-27 DIAGNOSIS — R062 Wheezing: Secondary | ICD-10-CM

## 2013-08-27 DIAGNOSIS — F17200 Nicotine dependence, unspecified, uncomplicated: Secondary | ICD-10-CM

## 2013-08-27 DIAGNOSIS — Z720 Tobacco use: Secondary | ICD-10-CM

## 2013-08-27 LAB — BASIC METABOLIC PANEL
BUN: 20 mg/dL (ref 6–23)
CHLORIDE: 101 meq/L (ref 96–112)
CO2: 25 mEq/L (ref 19–32)
Calcium: 9.1 mg/dL (ref 8.4–10.5)
Creat: 1.01 mg/dL (ref 0.50–1.10)
Glucose, Bld: 63 mg/dL — ABNORMAL LOW (ref 70–99)
Potassium: 4.5 mEq/L (ref 3.5–5.3)
Sodium: 139 mEq/L (ref 135–145)

## 2013-08-27 LAB — POCT CBC
Granulocyte percent: 68 %G (ref 37–80)
HCT, POC: 39.1 % (ref 37.7–47.9)
Hemoglobin: 12.1 g/dL — AB (ref 12.2–16.2)
Lymph, poc: 2.6 (ref 0.6–3.4)
MCH, POC: 30.3 pg (ref 27–31.2)
MCHC: 30.9 g/dL — AB (ref 31.8–35.4)
MCV: 98 fL — AB (ref 80–97)
MID (cbc): 0.8 (ref 0–0.9)
MPV: 8.2 fL (ref 0–99.8)
POC Granulocyte: 7.3 — AB (ref 2–6.9)
POC LYMPH PERCENT: 24.2 %L (ref 10–50)
POC MID %: 7.8 %M (ref 0–12)
Platelet Count, POC: 349 10*3/uL (ref 142–424)
RBC: 3.99 M/uL — AB (ref 4.04–5.48)
RDW, POC: 13.2 %
WBC: 10.8 10*3/uL — AB (ref 4.6–10.2)

## 2013-08-27 MED ORDER — DOXYCYCLINE HYCLATE 100 MG PO CAPS: 100.0000 mg | ORAL_CAPSULE | Freq: Two times a day (BID) | ORAL | Status: DC

## 2013-08-27 MED ORDER — FLUTICASONE PROPIONATE 50 MCG/ACT NA SUSP: 2.0000 | Freq: Every day | NASAL | Status: DC

## 2013-08-27 MED ORDER — ALBUTEROL SULFATE HFA 108 (90 BASE) MCG/ACT IN AERS: 2.0000 | INHALATION_SPRAY | Freq: Four times a day (QID) | RESPIRATORY_TRACT | Status: DC | PRN

## 2013-08-27 MED ORDER — HYDROCODONE-HOMATROPINE 5-1.5 MG/5ML PO SYRP: 5.0000 mL | ORAL_SOLUTION | Freq: Three times a day (TID) | ORAL | Status: DC | PRN

## 2013-08-27 MED ORDER — PREDNISONE 20 MG PO TABS: ORAL_TABLET | ORAL | Status: DC

## 2013-08-27 NOTE — Patient Instructions (Signed)
Use the prednisone and doxycycline as directed.   Use the albuterol inhaler as needed for cough and wheezing.  You can also use the cough syrup as needed; remember it will make you sleepy so do not use it when you need to drive.    Let me know if you are not getting better soon- Sooner if worse.

## 2013-08-27 NOTE — Progress Notes (Signed)
[facility] [address], [place] [address] [phone]  Date:  08/27/2013   Name:  Sheryl [name]   DOB:  02-09-1951   MRN:  [id number]  PCP:  Leandrew Koyanagi, MD    Chief Complaint: Cough   History of Present Illness:  Sheryl [name] is a 63 y.o. very pleasant female patient who presents with the following:  She is here today to follow-up recent illness She was seen on 12/23 with ST and cough- she was tx with agumentin,hycodan, flonase and tessalon.  She did get better for awhile but was never 100%.   A few days ago she started sneezing.  Then last night she noted return of her cough- she is coughing up green material.   She does have some ST- not as bad as before.  Ears feels congested.   She has laryngitis.  She notes sinus pressure.  She has not noted a fever.  No chills or aches.   No GI symptoms.    She is a smoker.  She is trying to cut back and is now down to < 1/2 PPD  Patient Active Problem List   Diagnosis Date Noted  . Nicotine addiction 06/06/2012  . HTN (hypertension) 06/04/2012  . Hyperlipidemia 06/04/2012  . Depression 06/04/2012  . ADD (attention deficit disorder) 06/04/2012  . AR (allergic rhinitis) 06/04/2012    Past Medical History  Diagnosis Date  . Allergy   . Arthritis   . Osteoporosis     Past Surgical History  Procedure Laterality Date  . Joint replacement      History  Substance Use Topics  . Smoking status: Current Every Day Smoker  . Smokeless tobacco: Not on file  . Alcohol Use: No    No family history on file.  No Known Allergies  Medication list has been reviewed and updated.  Current Outpatient Prescriptions on File Prior to Visit  Medication Sig Dispense Refill  . atomoxetine (STRATTERA) 100 MG capsule TAKE ONE CAPSULE EVERY DAY  90 capsule  3  . atorvastatin (LIPITOR) 40 MG tablet TAKE 1 TABLET EVERY DAY  90 tablet  3  . clobetasol cream (TEMOVATE) 0.05 % Apply topically 2 (two) times  daily.  30 g  5  . FLUoxetine (PROZAC) 40 MG capsule Take 1 capsule (40 mg total) by mouth daily.  90 capsule  3  . HYDROcodone-homatropine (HYCODAN) 5-1.5 MG/5ML syrup Take 5 mLs by mouth at bedtime as needed for cough.  120 mL  0  . losartan-hydrochlorothiazide (HYZAAR) 100-12.5 MG per tablet Take 1 tablet by mouth daily.  90 tablet  3  . meloxicam (MOBIC) 15 MG tablet TAKE 1 TABLET EVERY DAY  90 tablet  3  . traZODone (DESYREL) 50 MG tablet TAKE 1 TABLET AT BEDTIME  90 tablet  3  . zoster vaccine live, PF, (ZOSTAVAX) [id number] UNT/0.65ML injection Inject 19,400 Units into the skin once. Administer at pharmacy  1 each  0  . traZODone (DESYREL) 50 MG tablet Take 1 tablet (50 mg total) by mouth at bedtime.  90 tablet  3   No current facility-administered medications on file prior to visit.    Review of Systems:  As per HPI- otherwise negative.   Physical Examination: Filed Vitals:   08/27/13 1418  BP: 120/62  Pulse: 89  Temp: 97.5 F (36.4 C)  Resp: 16   Filed Vitals:   08/27/13 1418  Height: 5\' 1"  (1.549 m)  Weight: 139  lb 6.4 oz (63.231 kg)   Body mass index is 26.35 kg/(m^2). Ideal Body Weight: Weight in (lb) to have BMI = 25: 132  GEN: WDWN, NAD, Non-toxic, A & O x 3 HEENT: Atraumatic, Normocephalic. Neck supple. No masses, No LAD.  Bilateral TM wnl, oropharynx normal.  PEERL,EOMI.   Ears and Nose: No external deformity. CV: RRR, No M/G/R. No JVD. No thrill. No extra heart sounds. PULM: CTA B, no crackles, rhonchi. No retractions. No resp. distress. No accessory muscle use. Mild expiratory wheezing.  ABD: S, NT, ND, +BS. No rebound. No HSM. EXTR: No c/c/e NEURO Normal gait.  PSYCH: Normally interactive. Conversant. Not depressed or anxious appearing.  Calm demeanor.   [facility] reading (PRIMARY) by  Dr. Lorelei Pont. CXR: negative  CHEST 2 VIEW  COMPARISON: 06/04/2012  FINDINGS: The heart size and mediastinal contours are within normal limits. Both lungs are clear. The  visualized skeletal structures are unremarkable.  IMPRESSION: No active cardiopulmonary disease.   Results for orders placed in visit on 08/27/13  POCT CBC      Result Value Range   WBC 10.8 (*) 4.6 - 10.2 K/uL   Lymph, poc 2.6  0.6 - 3.4   POC LYMPH PERCENT 24.2  10 - 50 %L   MID (cbc) 0.8  0 - 0.9   POC MID % 7.8  0 - 12 %M   POC Granulocyte 7.3 (*) 2 - 6.9   Granulocyte percent 68.0  37 - 80 %G   RBC 3.99 (*) 4.04 - 5.48 M/uL   Hemoglobin 12.1 (*) 12.2 - 16.2 g/dL   HCT, POC 39.1  37.7 - 47.9 %   MCV 98.0 (*) 80 - 97 fL   MCH, POC 30.3  27 - 31.2 pg   MCHC 30.9 (*) 31.8 - 35.4 g/dL   RDW, POC 13.2     Platelet Count, POC 349  142 - 424 K/uL   MPV 8.2  0 - 99.8 fL    Assessment and Plan: Cough - Plan: POCT CBC, DG Chest 2 View, predniSONE (DELTASONE) 20 MG tablet, Basic metabolic panel, HYDROcodone-homatropine (HYCODAN) 5-1.5 MG/5ML syrup  Tobacco abuse  Wheezing - Plan: albuterol (PROVENTIL HFA;VENTOLIN HFA) 108 (90 BASE) MCG/ACT inhaler, doxycycline (VIBRAMYCIN) 100 MG capsule  Acute sinusitis - Plan: fluticasone (FLONASE) 50 MCG/ACT nasal spray  encouraged her to stop smoking.  Treat current cough and bronchospasm with prednisone, albuterol, flonase, doxycyline.  Refilled her hycodan syrup as well  Signed Lamar Blinks, MD

## 2013-08-28 ENCOUNTER — Telehealth: Payer: Self-pay | Admitting: Family Medicine

## 2013-08-28 ENCOUNTER — Encounter: Payer: Self-pay | Admitting: Family Medicine

## 2013-08-28 NOTE — Telephone Encounter (Signed)
Called to check on her today- she is doing well.  Reminded her to avoid all NSAID products including her mobic and OTCs while she is on prednisone

## 2013-10-21 ENCOUNTER — Ambulatory Visit (INDEPENDENT_AMBULATORY_CARE_PROVIDER_SITE_OTHER): Payer: BC Managed Care – PPO | Admitting: Family Medicine

## 2013-10-21 VITALS — BP 100/70 | HR 105 | Temp 98.1°F | Resp 16 | Ht 60.5 in | Wt 139.0 lb

## 2013-10-21 DIAGNOSIS — Z720 Tobacco use: Secondary | ICD-10-CM

## 2013-10-21 DIAGNOSIS — R05 Cough: Secondary | ICD-10-CM

## 2013-10-21 DIAGNOSIS — R062 Wheezing: Secondary | ICD-10-CM

## 2013-10-21 DIAGNOSIS — J9801 Acute bronchospasm: Secondary | ICD-10-CM

## 2013-10-21 DIAGNOSIS — R059 Cough, unspecified: Secondary | ICD-10-CM

## 2013-10-21 DIAGNOSIS — F17200 Nicotine dependence, unspecified, uncomplicated: Secondary | ICD-10-CM

## 2013-10-21 DIAGNOSIS — J0100 Acute maxillary sinusitis, unspecified: Secondary | ICD-10-CM

## 2013-10-21 MED ORDER — ALBUTEROL SULFATE HFA 108 (90 BASE) MCG/ACT IN AERS: 1.0000 | INHALATION_SPRAY | Freq: Four times a day (QID) | RESPIRATORY_TRACT | Status: DC | PRN

## 2013-10-21 MED ORDER — AMOXICILLIN-POT CLAVULANATE 875-125 MG PO TABS: 1.0000 | ORAL_TABLET | Freq: Two times a day (BID) | ORAL | Status: DC

## 2013-10-21 NOTE — Progress Notes (Signed)
Subjective:    Patient ID: Sheryl [name], female    DOB: [birth year], 63 y.o.   MRN: [id number]  HPI Sheryl [name] is a 63 y.o. female PCP: Leandrew Koyanagi, MD  Last seen 08/27/13 by Dr. Lorelei Pont - Treated sinusitis, cough, bronchospasm with prednisone, albuterol, flonase, doxycyline.  Refilled her hycodan syrup as well that visit.   Today - c/o - feels like ears never unstopped since last ov.  Tried netipot - on R yesterday - more pain than usual, and drew some tears.  Yellow and green d/c then from R side. Ears still feels stopped up more on R than L.  No fever.  Some cough, but more nasal d/c for past 2-3 weeks. Not using flonase ns recently - once last week.  Used more initially - not a lot - didn't know what it was for. Has been albuterol up to twice per day, but not everyday. Ran out last week. Feels pnd triggering cough.  Breathing ok today- baseline. Sore to cough. Short of breath with mvmt - no recent change. Teeth hurt on upper R side. Blowing clear d/c for a few weeks until yesterday.   Smoker - 5-6 per day recently, prior 1ppd since 63 yo - about 35 pack year hx.   Feels like prednisone caused sores on face.    Going to [employer] this weekend.    Hx of tinnitus for years - no recent changes.     Patient Active Problem List   Diagnosis Date Noted  . Nicotine addiction 06/06/2012  . HTN (hypertension) 06/04/2012  . Hyperlipidemia 06/04/2012  . Depression 06/04/2012  . ADD (attention deficit disorder) 06/04/2012  . AR (allergic rhinitis) 06/04/2012   Past Medical History  Diagnosis Date  . Allergy   . Arthritis   . Osteoporosis    Past Surgical History  Procedure Laterality Date  . Joint replacement     No Known Allergies Prior to Admission medications   Medication Sig Start Date End Date Taking? Authorizing Provider  albuterol (PROVENTIL HFA;VENTOLIN HFA) 108 (90 BASE) MCG/ACT inhaler Inhale 2 puffs into the lungs every 6 (six) hours as needed for  wheezing or shortness of breath. 08/27/13  Yes Gay Filler Copland, MD  atomoxetine (STRATTERA) 100 MG capsule TAKE ONE CAPSULE EVERY DAY 07/19/13  Yes Leandrew Koyanagi, MD  atorvastatin (LIPITOR) 40 MG tablet TAKE 1 TABLET EVERY DAY 07/19/13  Yes Leandrew Koyanagi, MD  clobetasol cream (TEMOVATE) 0.05 % Apply topically 2 (two) times daily. 06/04/12  Yes Leandrew Koyanagi, MD  FLUoxetine (PROZAC) 40 MG capsule Take 1 capsule (40 mg total) by mouth daily. 07/19/13  Yes Leandrew Koyanagi, MD  fluticasone (FLONASE) 50 MCG/ACT nasal spray Place 2 sprays into both nostrils daily. 08/27/13  Yes Gay Filler Copland, MD  HYDROcodone-homatropine (HYCODAN) 5-1.5 MG/5ML syrup Take 5 mLs by mouth every 8 (eight) hours as needed for cough. 08/27/13  Yes Gay Filler Copland, MD  losartan-hydrochlorothiazide (HYZAAR) 100-12.5 MG per tablet Take 1 tablet by mouth daily. 07/19/13  Yes Leandrew Koyanagi, MD  meloxicam (MOBIC) 15 MG tablet TAKE 1 TABLET EVERY DAY 07/19/13  Yes Leandrew Koyanagi, MD  traZODone (DESYREL) 50 MG tablet TAKE 1 TABLET AT BEDTIME 08/14/13  Yes Chelle S Jeffery, PA-C  doxycycline (VIBRAMYCIN) 100 MG capsule Take 1 capsule (100 mg total) by mouth 2 (two) times daily. 08/27/13   Gay Filler Copland, MD  predniSONE (DELTASONE) 20 MG tablet Take 2 pills a  day for 3 days, then 1 pill a day for 3 days 08/27/13   Darreld Mclean, MD   History   Social History  . Marital Status: Divorced    Spouse Name: N/A    Number of Children: N/A  . Years of Education: N/A   Occupational History  . Not on file.   Social History Main Topics  . Smoking status: Current Every Day Smoker  . Smokeless tobacco: Not on file  . Alcohol Use: No  . Drug Use: No  . Sexual Activity: Yes   Other Topics Concern  . Not on file   Social History Narrative  . No narrative on file       Review of Systems  Constitutional: Negative for fever and chills.  HENT: Positive for sinus pressure. Negative for nosebleeds and  sore throat.   Respiratory: Positive for cough and shortness of breath (as above.).   Neurological: Positive for headaches.  other as above.      Objective:   Physical Exam  Vitals reviewed. Constitutional: She is oriented to person, place, and time. She appears well-developed and well-nourished. No distress.  HENT:  Head: Normocephalic and atraumatic.  Right Ear: Hearing, external ear and ear canal normal. Tympanic membrane is not erythematous and not retracted. A middle ear effusion (min clear fluid behind L tm., min cerumen in R canal o/w clear. ) is present.  Left Ear: Hearing, tympanic membrane, external ear and ear canal normal. Tympanic membrane is not erythematous and not retracted.  Nose: Right sinus exhibits maxillary sinus tenderness. Right sinus exhibits no frontal sinus tenderness. Left sinus exhibits no maxillary sinus tenderness and no frontal sinus tenderness.  Mouth/Throat: Oropharynx is clear and moist. No oropharyngeal exudate.  Eyes: Conjunctivae and EOM are normal. Pupils are equal, round, and reactive to light.  Cardiovascular: Normal rate, regular rhythm, normal heart sounds and intact distal pulses.   No murmur heard. Pulmonary/Chest: Effort normal and breath sounds normal. No respiratory distress. She has no wheezes. She has no rhonchi.  Lungs clear.   Neurological: She is alert and oriented to person, place, and time.  Skin: Skin is warm and dry. No rash noted.  Psychiatric: She has a normal mood and affect. Her behavior is normal.   Filed Vitals:   10/21/13 1246  BP: 100/70  Pulse: 105  Temp: 98.1 F (36.7 C)  Resp: 16      Assessment & Plan:   [name] is a 63 y.o. female Tobacco abuse, Cough,  Hx of Bronchospasm - significant tobacco history, suspect possible COPD. Phone number given to smoking cessation clinic and advised quitting which she is amenable to. Refilled albuterol if need but if frequent use to return for recheck. Also recommended  follow up with PCP in next month to discuss further.   Sinusitis, acute maxillary - Plan: amoxicillin-clavulanate (AUGMENTIN) 875-125 MG per tablet. Suspect component of chronic sinusitis with secondary acute right maxillary as secondary sickening and tooth pain. Start antibiotic, continue saline nasal spray and recommended restart Flonase. rtc precautions given.    Meds ordered this encounter  Medications  . amoxicillin-clavulanate (AUGMENTIN) 875-125 MG per tablet    Sig: Take 1 tablet by mouth 2 (two) times daily.    Dispense:  20 tablet    Refill:  0  . albuterol (PROVENTIL HFA;VENTOLIN HFA) 108 (90 BASE) MCG/ACT inhaler    Sig: Inhale 1-2 puffs into the lungs every 6 (six) hours as needed for wheezing or shortness of  breath.    Dispense:  1 Inhaler    Refill:  0   Patient Instructions  Start the antibiotic as discussed, saline nasal spray throughout the day and restart flonase to help with pressure in ear. albuterol if needed for wheezing, but return to discuss this further as you may have some signs of COPD.  If ears not improved in next few weeks, or worsening symptoms sooner - return here or emergency room.  Return to the clinic or go to the nearest emergency room if any of your symptoms worsen or new symptoms occur. [facility] offers smoking cessation clinics. Registration is required. To register call [phone] or register online at [email or web address]  Sinusitis Sinusitis is redness, soreness, and swelling (inflammation) of the paranasal sinuses. Paranasal sinuses are air pockets within the bones of your face (beneath the eyes, the middle of the forehead, or above the eyes). In healthy paranasal sinuses, mucus is able to drain out, and air is able to circulate through them by way of your nose. However, when your paranasal sinuses are inflamed, mucus and air can become trapped. This can allow bacteria and other germs to grow and cause infection. Sinusitis can develop quickly and  last only a short time (acute) or continue over a long period (chronic). Sinusitis that lasts for more than 12 weeks is considered chronic.  CAUSES  Causes of sinusitis include:  Allergies.  Structural abnormalities, such as displacement of the cartilage that separates your nostrils (deviated septum), which can decrease the air flow through your nose and sinuses and affect sinus drainage.  Functional abnormalities, such as when the small hairs (cilia) that line your sinuses and help remove mucus do not work properly or are not present. SYMPTOMS  Symptoms of acute and chronic sinusitis are the same. The primary symptoms are pain and pressure around the affected sinuses. Other symptoms include:  Upper toothache.  Earache.  Headache.  Bad breath.  Decreased sense of smell and taste.  A cough, which worsens when you are lying flat.  Fatigue.  Fever.  Thick drainage from your nose, which often is green and may contain pus (purulent).  Swelling and warmth over the affected sinuses. DIAGNOSIS  Your caregiver will perform a physical exam. During the exam, your caregiver may:  Look in your nose for signs of abnormal growths in your nostrils (nasal polyps).  Tap over the affected sinus to check for signs of infection.  View the inside of your sinuses (endoscopy) with a special imaging device with a light attached (endoscope), which is inserted into your sinuses. If your caregiver suspects that you have chronic sinusitis, one or more of the following tests may be recommended:  Allergy tests.  Nasal culture A sample of mucus is taken from your nose and sent to a lab and screened for bacteria.  Nasal cytology A sample of mucus is taken from your nose and examined by your caregiver to determine if your sinusitis is related to an allergy. TREATMENT  Most cases of acute sinusitis are related to a viral infection and will resolve on their own within 10 days. Sometimes medicines are  prescribed to help relieve symptoms (pain medicine, decongestants, nasal steroid sprays, or saline sprays).  However, for sinusitis related to a bacterial infection, your caregiver will prescribe antibiotic medicines. These are medicines that will help kill the bacteria causing the infection.  Rarely, sinusitis is caused by a fungal infection. In theses cases, your caregiver will prescribe antifungal medicine. For some cases of  chronic sinusitis, surgery is needed. Generally, these are cases in which sinusitis recurs more than 3 times per year, despite other treatments. HOME CARE INSTRUCTIONS   Drink plenty of water. Water helps thin the mucus so your sinuses can drain more easily.  Use a humidifier.  Inhale steam 3 to 4 times a day (for example, sit in the bathroom with the shower running).  Apply a warm, moist washcloth to your face 3 to 4 times a day, or as directed by your caregiver.  Use saline nasal sprays to help moisten and clean your sinuses.  Take over-the-counter or prescription medicines for pain, discomfort, or fever only as directed by your caregiver. SEEK IMMEDIATE MEDICAL CARE IF:  You have increasing pain or severe headaches.  You have nausea, vomiting, or drowsiness.  You have swelling around your face.  You have vision problems.  You have a stiff neck.  You have difficulty breathing. MAKE SURE YOU:   Understand these instructions.  Will watch your condition.  Will get help right away if you are not doing well or get worse. Document Released: 08/06/2005 Document Revised: 10/29/2011 Document Reviewed: 08/21/2011 [facility] Patient Information 2014 [place], [place]. Cough, Adult  A cough is a reflex that helps clear your throat and airways. It can help heal the body or may be a reaction to an irritated airway. A cough may only last 2 or 3 weeks (acute) or may last more than 8 weeks (chronic).  CAUSES Acute cough:  Viral or bacterial infections. Chronic  cough:  Infections.  Allergies.  Asthma.  Post-nasal drip.  Smoking.  Heartburn or acid reflux.  Some medicines.  Chronic lung problems (COPD).  Cancer. SYMPTOMS   Cough.  Fever.  Chest pain.  Increased breathing rate.  High-pitched whistling sound when breathing (wheezing).  Colored mucus that you cough up (sputum). TREATMENT   A bacterial cough may be treated with antibiotic medicine.  A viral cough must run its course and will not respond to antibiotics.  Your caregiver may recommend other treatments if you have a chronic cough. HOME CARE INSTRUCTIONS   Only take over-the-counter or prescription medicines for pain, discomfort, or fever as directed by your caregiver. Use cough suppressants only as directed by your caregiver.  Use a cold steam vaporizer or humidifier in your bedroom or home to help loosen secretions.  Sleep in a semi-upright position if your cough is worse at night.  Rest as needed.  Stop smoking if you smoke. SEEK IMMEDIATE MEDICAL CARE IF:   You have pus in your sputum.  Your cough starts to worsen.  You cannot control your cough with suppressants and are losing sleep.  You begin coughing up blood.  You have difficulty breathing.  You develop pain which is getting worse or is uncontrolled with medicine.  You have a fever. MAKE SURE YOU:   Understand these instructions.  Will watch your condition.  Will get help right away if you are not doing well or get worse. Document Released: 02/02/2011 Document Revised: 10/29/2011 Document Reviewed: 02/02/2011 [facility] Patient Information 2014 [place].

## 2013-10-21 NOTE — Patient Instructions (Addendum)
Start the antibiotic as discussed, saline nasal spray throughout the day and restart flonase to help with pressure in ear. albuterol if needed for wheezing, but return to discuss this further as you may have some signs of COPD.  If ears not improved in next few weeks, or worsening symptoms sooner - return here or emergency room.  Return to the clinic or go to the nearest emergency room if any of your symptoms worsen or new symptoms occur. [facility] offers smoking cessation clinics. Registration is required. To register call [phone] or register online at [email or web address]  Sinusitis Sinusitis is redness, soreness, and swelling (inflammation) of the paranasal sinuses. Paranasal sinuses are air pockets within the bones of your face (beneath the eyes, the middle of the forehead, or above the eyes). In healthy paranasal sinuses, mucus is able to drain out, and air is able to circulate through them by way of your nose. However, when your paranasal sinuses are inflamed, mucus and air can become trapped. This can allow bacteria and other germs to grow and cause infection. Sinusitis can develop quickly and last only a short time (acute) or continue over a long period (chronic). Sinusitis that lasts for more than 12 weeks is considered chronic.  CAUSES  Causes of sinusitis include:  Allergies.  Structural abnormalities, such as displacement of the cartilage that separates your nostrils (deviated septum), which can decrease the air flow through your nose and sinuses and affect sinus drainage.  Functional abnormalities, such as when the small hairs (cilia) that line your sinuses and help remove mucus do not work properly or are not present. SYMPTOMS  Symptoms of acute and chronic sinusitis are the same. The primary symptoms are pain and pressure around the affected sinuses. Other symptoms include:  Upper toothache.  Earache.  Headache.  Bad breath.  Decreased sense of smell and taste.  A cough,  which worsens when you are lying flat.  Fatigue.  Fever.  Thick drainage from your nose, which often is green and may contain pus (purulent).  Swelling and warmth over the affected sinuses. DIAGNOSIS  Your caregiver will perform a physical exam. During the exam, your caregiver may:  Look in your nose for signs of abnormal growths in your nostrils (nasal polyps).  Tap over the affected sinus to check for signs of infection.  View the inside of your sinuses (endoscopy) with a special imaging device with a light attached (endoscope), which is inserted into your sinuses. If your caregiver suspects that you have chronic sinusitis, one or more of the following tests may be recommended:  Allergy tests.  Nasal culture A sample of mucus is taken from your nose and sent to a lab and screened for bacteria.  Nasal cytology A sample of mucus is taken from your nose and examined by your caregiver to determine if your sinusitis is related to an allergy. TREATMENT  Most cases of acute sinusitis are related to a viral infection and will resolve on their own within 10 days. Sometimes medicines are prescribed to help relieve symptoms (pain medicine, decongestants, nasal steroid sprays, or saline sprays).  However, for sinusitis related to a bacterial infection, your caregiver will prescribe antibiotic medicines. These are medicines that will help kill the bacteria causing the infection.  Rarely, sinusitis is caused by a fungal infection. In theses cases, your caregiver will prescribe antifungal medicine. For some cases of chronic sinusitis, surgery is needed. Generally, these are cases in which sinusitis recurs more than 3 times per year, despite  other treatments. HOME CARE INSTRUCTIONS   Drink plenty of water. Water helps thin the mucus so your sinuses can drain more easily.  Use a humidifier.  Inhale steam 3 to 4 times a day (for example, sit in the bathroom with the shower running).  Apply a  warm, moist washcloth to your face 3 to 4 times a day, or as directed by your caregiver.  Use saline nasal sprays to help moisten and clean your sinuses.  Take over-the-counter or prescription medicines for pain, discomfort, or fever only as directed by your caregiver. SEEK IMMEDIATE MEDICAL CARE IF:  You have increasing pain or severe headaches.  You have nausea, vomiting, or drowsiness.  You have swelling around your face.  You have vision problems.  You have a stiff neck.  You have difficulty breathing. MAKE SURE YOU:   Understand these instructions.  Will watch your condition.  Will get help right away if you are not doing well or get worse. Document Released: 08/06/2005 Document Revised: 10/29/2011 Document Reviewed: 08/21/2011 [facility] Patient Information 2014 [place], [place]. Cough, Adult  A cough is a reflex that helps clear your throat and airways. It can help heal the body or may be a reaction to an irritated airway. A cough may only last 2 or 3 weeks (acute) or may last more than 8 weeks (chronic).  CAUSES Acute cough:  Viral or bacterial infections. Chronic cough:  Infections.  Allergies.  Asthma.  Post-nasal drip.  Smoking.  Heartburn or acid reflux.  Some medicines.  Chronic lung problems (COPD).  Cancer. SYMPTOMS   Cough.  Fever.  Chest pain.  Increased breathing rate.  High-pitched whistling sound when breathing (wheezing).  Colored mucus that you cough up (sputum). TREATMENT   A bacterial cough may be treated with antibiotic medicine.  A viral cough must run its course and will not respond to antibiotics.  Your caregiver may recommend other treatments if you have a chronic cough. HOME CARE INSTRUCTIONS   Only take over-the-counter or prescription medicines for pain, discomfort, or fever as directed by your caregiver. Use cough suppressants only as directed by your caregiver.  Use a cold steam vaporizer or humidifier in your  bedroom or home to help loosen secretions.  Sleep in a semi-upright position if your cough is worse at night.  Rest as needed.  Stop smoking if you smoke. SEEK IMMEDIATE MEDICAL CARE IF:   You have pus in your sputum.  Your cough starts to worsen.  You cannot control your cough with suppressants and are losing sleep.  You begin coughing up blood.  You have difficulty breathing.  You develop pain which is getting worse or is uncontrolled with medicine.  You have a fever. MAKE SURE YOU:   Understand these instructions.  Will watch your condition.  Will get help right away if you are not doing well or get worse. Document Released: 02/02/2011 Document Revised: 10/29/2011 Document Reviewed: 02/02/2011 [facility] Patient Information 2014 [place].

## 2013-10-25 ENCOUNTER — Other Ambulatory Visit: Payer: Self-pay | Admitting: Physician Assistant

## 2013-11-11 ENCOUNTER — Other Ambulatory Visit: Payer: Self-pay | Admitting: Physician Assistant

## 2013-12-25 ENCOUNTER — Ambulatory Visit (INDEPENDENT_AMBULATORY_CARE_PROVIDER_SITE_OTHER): Payer: BC Managed Care – PPO | Admitting: Family Medicine

## 2013-12-25 VITALS — BP 122/80 | HR 82 | Temp 97.3°F | Resp 18 | Ht 60.0 in | Wt 137.8 lb

## 2013-12-25 DIAGNOSIS — H6980 Other specified disorders of Eustachian tube, unspecified ear: Secondary | ICD-10-CM

## 2013-12-25 DIAGNOSIS — J309 Allergic rhinitis, unspecified: Secondary | ICD-10-CM

## 2013-12-25 DIAGNOSIS — J0100 Acute maxillary sinusitis, unspecified: Secondary | ICD-10-CM

## 2013-12-25 DIAGNOSIS — R42 Dizziness and giddiness: Secondary | ICD-10-CM

## 2013-12-25 DIAGNOSIS — R062 Wheezing: Secondary | ICD-10-CM

## 2013-12-25 LAB — COMPREHENSIVE METABOLIC PANEL
ALT: 39 U/L — ABNORMAL HIGH (ref 0–35)
AST: 31 U/L (ref 0–37)
Albumin: 4.1 g/dL (ref 3.5–5.2)
Alkaline Phosphatase: 148 U/L — ABNORMAL HIGH (ref 39–117)
BUN: 24 mg/dL — ABNORMAL HIGH (ref 6–23)
CALCIUM: 9.6 mg/dL (ref 8.4–10.5)
CO2: 26 mEq/L (ref 19–32)
Chloride: 101 mEq/L (ref 96–112)
Creat: 1.26 mg/dL — ABNORMAL HIGH (ref 0.50–1.10)
GLUCOSE: 76 mg/dL (ref 70–99)
POTASSIUM: 4.4 meq/L (ref 3.5–5.3)
SODIUM: 136 meq/L (ref 135–145)
TOTAL PROTEIN: 7.4 g/dL (ref 6.0–8.3)
Total Bilirubin: 0.4 mg/dL (ref 0.2–1.2)

## 2013-12-25 LAB — POCT CBC
Granulocyte percent: 60.8 %G (ref 37–80)
HCT, POC: 39.5 % (ref 37.7–47.9)
HEMOGLOBIN: 12.6 g/dL (ref 12.2–16.2)
LYMPH, POC: 2.5 (ref 0.6–3.4)
MCH, POC: 30.7 pg (ref 27–31.2)
MCHC: 31.9 g/dL (ref 31.8–35.4)
MCV: 96 fL (ref 80–97)
MID (cbc): 0.7 (ref 0–0.9)
MPV: 8.1 fL (ref 0–99.8)
POC GRANULOCYTE: 4.9 (ref 2–6.9)
POC LYMPH PERCENT: 31 %L (ref 10–50)
POC MID %: 8.2 %M (ref 0–12)
Platelet Count, POC: 349 10*3/uL (ref 142–424)
RBC: 4.11 M/uL (ref 4.04–5.48)
RDW, POC: 13.2 %
WBC: 8 10*3/uL (ref 4.6–10.2)

## 2013-12-25 MED ORDER — MECLIZINE HCL 25 MG PO TABS: 12.5000 mg | ORAL_TABLET | Freq: Three times a day (TID) | ORAL | Status: DC | PRN

## 2013-12-25 MED ORDER — AMOXICILLIN-POT CLAVULANATE 875-125 MG PO TABS: 1.0000 | ORAL_TABLET | Freq: Two times a day (BID) | ORAL | Status: DC

## 2013-12-25 MED ORDER — ALBUTEROL SULFATE HFA 108 (90 BASE) MCG/ACT IN AERS: 1.0000 | INHALATION_SPRAY | Freq: Four times a day (QID) | RESPIRATORY_TRACT | Status: DC | PRN

## 2013-12-25 NOTE — Progress Notes (Addendum)
Patient ID: Sheryl [name], female   DOB: [birth year], 63 y.o.   MRN: [id number]   This chart was scribed for Sheryl Honour, MD by Marcha Dutton, ED Scribe. This patient was seen in room 5 and the patient's care was started at 12:54 PM.  Subjective:    Patient ID: Sheryl [name], female    DOB: [birth year], 63 y.o.   MRN: [id number]  12/25/2013  Dizziness and Medication Refill   HPI Pt seen 10/21/13 by Dr. Carlota Raspberry for sinus infection and was prescribed Augmentin and an albuterol inhaler.  HPI Comments: Sheryl [name] is a 63 y.o. female who presents to the [facility] complaining of vertigo that began yesterday. Pt reports a h/o vertigo but years ago. She states yesterday she felt like she was "floating" while she walked. She states she took a pill for heart burn akin to zantac or Pepcid and initially thought that might be the source of her symptoms. She reports some HA, a ringing in her ears, hearing loss (though prior to onset), mild nausea with turning her head and rolling in bed, sinus pressure, and wheezing. Pt denies falling, syncope, diarrhea, CP, palpitations, rhinorrhea,   Pt reports her tinnitus is severe. She describes it as a constant "roaring." She also reports fluid behind her ears since December. She states it feels like something is in her right ear. She states she is a current smoker. Pt reports the inhaler has helped her with her wheezing since beginning to use it after her last visit.    Review of Systems  Constitutional: Negative for fever and chills.  HENT: Positive for congestion, ear pain, hearing loss, sinus pressure and tinnitus. Negative for postnasal drip, rhinorrhea and sore throat.   Eyes: Negative for photophobia and visual disturbance.  Respiratory: Positive for wheezing. Negative for apnea, cough and shortness of breath.   Cardiovascular: Negative for chest pain, palpitations and leg swelling.  Gastrointestinal: Positive for nausea. Negative  for vomiting, abdominal pain and diarrhea.  Endocrine: Negative for cold intolerance, heat intolerance, polydipsia, polyphagia and polyuria.  Genitourinary: Negative for dysuria and hematuria.  Musculoskeletal: Negative for arthralgias, back pain and myalgias.  Skin: Negative for rash.  Neurological: Positive for dizziness and headaches. Negative for tremors, seizures, syncope, facial asymmetry, speech difficulty, weakness, light-headedness and numbness.     Past Medical History  Diagnosis Date  . Allergy   . Arthritis   . Osteoporosis     No Known Allergies Current Outpatient Prescriptions  Medication Sig Dispense Refill  . albuterol (PROVENTIL HFA;VENTOLIN HFA) 108 (90 BASE) MCG/ACT inhaler Inhale 1-2 puffs into the lungs every 6 (six) hours as needed for wheezing or shortness of breath. 1 Inhaler 1  . clobetasol cream (TEMOVATE) 0.05 % Apply topically 2 (two) times daily. 30 g 5  . fluticasone (FLONASE) 50 MCG/ACT nasal spray Place 2 sprays into both nostrils daily. 16 g 6  . meloxicam (MOBIC) 15 MG tablet TAKE 1 TABLET EVERY DAY 90 tablet 3  . atomoxetine (STRATTERA) 100 MG capsule TAKE ONE CAPSULE EVERY DAY 90 capsule 3  . FLUoxetine (PROZAC) 40 MG capsule Take 1 capsule (40 mg total) by mouth daily. 90 capsule 3  . FLUoxetine (PROZAC) 40 MG capsule TAKE ONE CAPSULE EVERY DAY 90 capsule 2  . losartan-hydrochlorothiazide (HYZAAR) 100-12.5 MG per tablet Take 1 tablet by mouth daily. 90 tablet 3  . meclizine (ANTIVERT) 25 MG tablet Take 0.5-1 tablets (12.5-25 mg total) by mouth 3 (three) times daily as  needed for dizziness or nausea. 30 tablet 0  . methylPREDNIsolone (MEDROL DOSPACK) 4 MG tablet follow package directions 21 tablet 0  . traZODone (DESYREL) 50 MG tablet TAKE 1 TABLET AT BEDTIME 90 tablet 3   No current facility-administered medications for this visit.       Objective:     Triage Vitals: BP 122/80  Pulse 82  Temp(Src) 97.3 F (36.3 C) (Oral)  Resp 18  Ht 5'  (1.524 m)  Wt 137 lb 12.8 oz (62.506 kg)  BMI 26.91 kg/m2  SpO2 100%   Physical Exam  Constitutional: She is oriented to person, place, and time. She appears well-developed and well-nourished. No distress.  HENT:  Head: Normocephalic and atraumatic.  Right Ear: External ear normal. Tympanic membrane is retracted.  Left Ear: External ear normal. Tympanic membrane is retracted.  Nose: Nose normal.  Mouth/Throat: Oropharynx is clear and moist. Mucous membranes are dry. No oropharyngeal exudate.  Eyes: Conjunctivae and EOM are normal. Pupils are equal, round, and reactive to light. Right eye exhibits no discharge. Left eye exhibits no discharge. No scleral icterus.  Neck: Normal range of motion. Neck supple. No tracheal deviation present. No thyromegaly present.  Cardiovascular: Normal rate, regular rhythm and intact distal pulses.  Exam reveals no gallop and no friction rub.   No murmur heard. Pulmonary/Chest: Effort normal and breath sounds normal. No stridor. No respiratory distress. She has no wheezes. She has no rales.  Abdominal: Soft. Bowel sounds are normal. She exhibits no distension. There is no tenderness. There is no rebound and no guarding.  Musculoskeletal: Normal range of motion. She exhibits no edema or tenderness.  Lymphadenopathy:    She has no cervical adenopathy.  Neurological: She is alert and oriented to person, place, and time. She has normal strength and normal reflexes. No cranial nerve deficit (no facial droop, extraocular movements intact, no slurred speech) or sensory deficit. She exhibits normal muscle tone. She displays a negative Romberg sign. She displays no seizure activity. Coordination normal.  Skin: Skin is warm and dry. No rash noted. She is not diaphoretic.  Psychiatric: She has a normal mood and affect. Her behavior is normal. Judgment and thought content normal.  Nursing note and vitals reviewed.   Results for orders placed or performed in visit on  12/25/13  Comprehensive metabolic panel  Result Value Ref Range   Sodium 136 135 - 145 mEq/L   Potassium 4.4 3.5 - 5.3 mEq/L   Chloride 101 96 - 112 mEq/L   CO2 26 19 - 32 mEq/L   Glucose, Bld 76 70 - 99 mg/dL   BUN 24 (H) 6 - 23 mg/dL   Creat 1.26 (H) 0.50 - 1.10 mg/dL   Total Bilirubin 0.4 0.2 - 1.2 mg/dL   Alkaline Phosphatase 148 (H) 39 - 117 U/L   AST 31 0 - 37 U/L   ALT 39 (H) 0 - 35 U/L   Total Protein 7.4 6.0 - 8.3 g/dL   Albumin 4.1 3.5 - 5.2 g/dL   Calcium 9.6 8.4 - 10.5 mg/dL  POCT CBC  Result Value Ref Range   WBC 8.0 4.6 - 10.2 K/uL   Lymph, poc 2.5 0.6 - 3.4   POC LYMPH PERCENT 31.0 10 - 50 %L   MID (cbc) 0.7 0 - 0.9   POC MID % 8.2 0 - 12 %M   POC Granulocyte 4.9 2 - 6.9   Granulocyte percent 60.8 37 - 80 %G   RBC 4.11 4.04 -  5.48 M/uL   Hemoglobin 12.6 12.2 - 16.2 g/dL   HCT, POC 39.5 37.7 - 47.9 %   MCV 96.0 80 - 97 fL   MCH, POC 30.7 27 - 31.2 pg   MCHC 31.9 31.8 - 35.4 g/dL   RDW, POC 13.2 %   Platelet Count, POC 349 142 - 424 K/uL   MPV 8.1 0 - 99.8 fL       Assessment & Plan:   1. Vertigo   2. Eustachian tube dysfunction   3. Allergic rhinitis   4. Acute maxillary sinusitis   5. Wheezing     1. Vertigo/labrynthitis: :New. Rx for Meclizine provided; RTC for acute worsening or lack of improvement. 2.  Acute maxillary sinusitis: New. Rx for Augmentin provided.  Recommend Claritin daily and Flonase. 3.  Allergic Rhinitis: uncontrolled; start Claritin and flonase. 4.  Bronchospasm: persistent; refill of albuterol provided.   Meds ordered this encounter  Medications  . meclizine (ANTIVERT) 25 MG tablet    Sig: Take 0.5-1 tablets (12.5-25 mg total) by mouth 3 (three) times daily as needed for dizziness or nausea.    Dispense:  30 tablet    Refill:  0  . DISCONTD: amoxicillin-clavulanate (AUGMENTIN) 875-125 MG per tablet    Sig: Take 1 tablet by mouth 2 (two) times daily.    Dispense:  20 tablet    Refill:  0  . albuterol (PROVENTIL  HFA;VENTOLIN HFA) 108 (90 BASE) MCG/ACT inhaler    Sig: Inhale 1-2 puffs into the lungs every 6 (six) hours as needed for wheezing or shortness of breath.    Dispense:  1 Inhaler    Refill:  1    No Follow-up on file.    I personally performed the services described in this documentation, which was scribed in my presence.  The recorded information has been reviewed and is accurate.  Reginia Forts, M.D.  [facility] [address] [place], [place]  [address] [phone] phone [phone] fax

## 2013-12-25 NOTE — Patient Instructions (Addendum)
1. START FLONASE 2 SPRAYS INTO EACH NOSTRIL DAILY. 2.  START NETTI POT ONCE DAILY. 3.  CONTINUE ZYRTEC 10MG  DAILY.  Vertigo Vertigo means you feel like you or your surroundings are moving when they are not. Vertigo can be dangerous if it occurs when you are at work, driving, or performing difficult activities.  CAUSES  Vertigo occurs when there is a conflict of signals sent to your brain from the visual and sensory systems in your body. There are many different causes of vertigo, including:  Infections, especially in the inner ear.  A bad reaction to a drug or misuse of alcohol and medicines.  Withdrawal from drugs or alcohol.  Rapidly changing positions, such as lying down or rolling over in bed.  A migraine headache.  Decreased blood flow to the brain.  Increased pressure in the brain from a head injury, infection, tumor, or bleeding. SYMPTOMS  You may feel as though the world is spinning around or you are falling to the ground. Because your balance is upset, vertigo can cause nausea and vomiting. You may have involuntary eye movements (nystagmus). DIAGNOSIS  Vertigo is usually diagnosed by physical exam. If the cause of your vertigo is unknown, your caregiver may perform imaging tests, such as an MRI scan (magnetic resonance imaging). TREATMENT  Most cases of vertigo resolve on their own, without treatment. Depending on the cause, your caregiver may prescribe certain medicines. If your vertigo is related to body position issues, your caregiver may recommend movements or procedures to correct the problem. In rare cases, if your vertigo is caused by certain inner ear problems, you may need surgery. HOME CARE INSTRUCTIONS   Follow your caregiver's instructions.  Avoid driving.  Avoid operating heavy machinery.  Avoid performing any tasks that would be dangerous to you or others during a vertigo episode.  Tell your caregiver if you notice that certain medicines seem to be causing  your vertigo. Some of the medicines used to treat vertigo episodes can actually make them worse in some people. SEEK IMMEDIATE MEDICAL CARE IF:   Your medicines do not relieve your vertigo or are making it worse.  You develop problems with talking, walking, weakness, or using your arms, hands, or legs.  You develop severe headaches.  Your nausea or vomiting continues or gets worse.  You develop visual changes.  A family member notices behavioral changes.  Your condition gets worse. MAKE SURE YOU:  Understand these instructions.  Will watch your condition.  Will get help right away if you are not doing well or get worse. Document Released: 05/16/2005 Document Revised: 10/29/2011 Document Reviewed: 02/22/2011 [facility] Patient Information 2014 [place].

## 2013-12-30 ENCOUNTER — Other Ambulatory Visit: Payer: Self-pay | Admitting: Internal Medicine

## 2014-01-03 ENCOUNTER — Ambulatory Visit (INDEPENDENT_AMBULATORY_CARE_PROVIDER_SITE_OTHER): Payer: BC Managed Care – PPO | Admitting: Internal Medicine

## 2014-01-03 VITALS — BP 118/76 | HR 99 | Temp 98.0°F | Resp 16 | Ht 60.0 in | Wt 137.0 lb

## 2014-01-03 DIAGNOSIS — M1990 Unspecified osteoarthritis, unspecified site: Secondary | ICD-10-CM

## 2014-01-03 DIAGNOSIS — I10 Essential (primary) hypertension: Secondary | ICD-10-CM

## 2014-01-03 DIAGNOSIS — F32A Depression, unspecified: Secondary | ICD-10-CM

## 2014-01-03 DIAGNOSIS — R799 Abnormal finding of blood chemistry, unspecified: Secondary | ICD-10-CM

## 2014-01-03 DIAGNOSIS — R945 Abnormal results of liver function studies: Secondary | ICD-10-CM

## 2014-01-03 DIAGNOSIS — R7989 Other specified abnormal findings of blood chemistry: Secondary | ICD-10-CM

## 2014-01-03 DIAGNOSIS — E785 Hyperlipidemia, unspecified: Secondary | ICD-10-CM

## 2014-01-03 DIAGNOSIS — J309 Allergic rhinitis, unspecified: Secondary | ICD-10-CM

## 2014-01-03 DIAGNOSIS — F17200 Nicotine dependence, unspecified, uncomplicated: Secondary | ICD-10-CM

## 2014-01-03 DIAGNOSIS — M129 Arthropathy, unspecified: Secondary | ICD-10-CM

## 2014-01-03 DIAGNOSIS — F988 Other specified behavioral and emotional disorders with onset usually occurring in childhood and adolescence: Secondary | ICD-10-CM

## 2014-01-03 DIAGNOSIS — F329 Major depressive disorder, single episode, unspecified: Secondary | ICD-10-CM

## 2014-01-03 LAB — RHEUMATOID FACTOR: Rhuematoid fact SerPl-aCnc: <10 IU/mL (ref <=14)

## 2014-01-03 LAB — COMPREHENSIVE METABOLIC PANEL
ALT: 78 U/L — ABNORMAL HIGH (ref 0–35)
AST: 52 U/L — ABNORMAL HIGH (ref 0–37)
Albumin: 4.2 g/dL (ref 3.5–5.2)
Alkaline Phosphatase: 157 U/L — ABNORMAL HIGH (ref 39–117)
BUN: 25 mg/dL — AB (ref 6–23)
CO2: 26 mEq/L (ref 19–32)
Calcium: 9.6 mg/dL (ref 8.4–10.5)
Chloride: 103 mEq/L (ref 96–112)
Creat: 1.05 mg/dL (ref 0.50–1.10)
GLUCOSE: 100 mg/dL — AB (ref 70–99)
Potassium: 4 mEq/L (ref 3.5–5.3)
Sodium: 138 mEq/L (ref 135–145)
Total Bilirubin: 0.3 mg/dL (ref 0.2–1.2)
Total Protein: 7.6 g/dL (ref 6.0–8.3)

## 2014-01-03 LAB — POCT URINALYSIS DIPSTICK
Bilirubin, UA: NEGATIVE
Blood, UA: NEGATIVE
Glucose, UA: NEGATIVE
Ketones, UA: NEGATIVE
LEUKOCYTES UA: NEGATIVE
NITRITE UA: NEGATIVE
PROTEIN UA: NEGATIVE
Spec Grav, UA: 1.015
UROBILINOGEN UA: 0.2
pH, UA: 6

## 2014-01-03 LAB — HEPATITIS B SURFACE ANTIGEN: Hepatitis B Surface Ag: NEGATIVE

## 2014-01-03 LAB — POCT UA - MICROSCOPIC ONLY
Bacteria, U Microscopic: NEGATIVE
Casts, Ur, LPF, POC: NEGATIVE
Crystals, Ur, HPF, POC: NEGATIVE
Mucus, UA: NEGATIVE
Yeast, UA: NEGATIVE

## 2014-01-03 LAB — HEPATITIS C ANTIBODY: HCV AB: NEGATIVE

## 2014-01-03 LAB — HEPATITIS B SURFACE ANTIBODY, QUANTITATIVE: Hepatitis B-Post: 0.5 m[IU]/mL

## 2014-01-03 LAB — POCT SEDIMENTATION RATE: POCT SED RATE: 99 mm/h — AB (ref 0–22)

## 2014-01-03 MED ORDER — ATORVASTATIN CALCIUM 40 MG PO TABS: ORAL_TABLET | ORAL | Status: DC

## 2014-01-03 MED ORDER — TRAZODONE HCL 50 MG PO TABS: ORAL_TABLET | ORAL | Status: DC

## 2014-01-03 MED ORDER — FLUOXETINE HCL 40 MG PO CAPS: 40.0000 mg | ORAL_CAPSULE | Freq: Every day | ORAL | Status: DC

## 2014-01-03 MED ORDER — ATOMOXETINE HCL 100 MG PO CAPS: ORAL_CAPSULE | ORAL | Status: DC

## 2014-01-03 MED ORDER — LOSARTAN POTASSIUM-HCTZ 100-12.5 MG PO TABS: 1.0000 | ORAL_TABLET | Freq: Every day | ORAL | Status: DC

## 2014-01-03 NOTE — Progress Notes (Addendum)
Subjective:   This chart was scribed for Tami Lin, MD by Forrestine Him, [facility] Scribe. This patient was seen in room 4 and the patient's care was started 10:57 AM.    Patient ID: Sheryl [name], female    DOB: Dec 19, 1950, 63 y.o.   MRN: [id number]  HPI  HPI Comments: Sheryl [name] is a 63 y.o. female who presents to [facility] for a Strattera 100 mg medication refill today. She is currently taking Prozac, Strattera, Mobic, and Trazodone as prescribed and directed. No complaints or difficulties at this time. Patient Active Problem List   Diagnosis Date Noted  . Nicotine addiction 06/06/2012  . HTN (hypertension) 06/04/2012  . Hyperlipidemia 06/04/2012  . Depression 06/04/2012  . ADD (attention deficit disorder) 06/04/2012  . AR (allergic rhinitis) 06/04/2012     Pt was recently diagnosed with Vertigo last week and was treated with Antivert. During last visit labs were drawn and she was told her "kidneys were not doing right" and that she needed to drink more water. She attributes this to using an OTC weight loss product called Alli. She also admits to drinking a large amount of coffee lately and much less water.  States she suspected a bladder infection about 1 week ago which was resolved with OTC cranberry capsules.  She reports intermittent indigestion. She is not taking anything OTC for relief, and states it resolves on its own.  At this time she denies any abdominal pain or worsening increased joint swelling.     Past Surgical History  Procedure Laterality Date  . Joint replacement    . Replacement total knee  2013    Review of Systems  Constitutional: Negative for fever and chills.  HENT: Negative for congestion.   Eyes: Negative for redness.  Respiratory: Negative for cough.   Gastrointestinal: Negative for abdominal pain.  Musculoskeletal: Positive for joint swelling.  Skin: Negative for rash.    Psychiatric/Behavioral: Negative for confusion.    Objective:  Physical Exam  Nursing note and vitals reviewed. Constitutional: She is oriented to person, place, and time. She appears well-developed and well-nourished. No distress.  HENT:  Head: Normocephalic and atraumatic.  Eyes:  Normal appearance  Neck: Normal range of motion.  Pulmonary/Chest: Effort normal.  Musculoskeletal: Normal range of motion.  Neurological: She is alert and oriented to person, place, and time.  Psychiatric: She has a normal mood and affect. Her behavior is normal.   Reviewed outside labs from May 8-creatinine 1.26 BUN 24 Alkaline phosphatase 148/AST 39   Triage Vitals: BP 118/76  Pulse 99  Temp(Src) 98 F (36.7 C) (Oral)  Resp 16  Ht 5' (1.524 m)  Wt 137 lb (62.143 kg)  BMI 26.76 kg/m2  SpO2 96%   Assessment & Plan:  Creatinine elevation - Plan: POCT UA - Microscopic Only, POCT urinalysis dipstick  Abnormal LFTs - Plan: Hepatitis C antibody, Hepatitis B surface antigen, Hepatitis B surface antibody, Comprehensive metabolic panel  Arthritis - Plan: Rheumatoid factor, POCT SEDIMENTATION RATE  Nicotine addiction  Hyperlipidemia  HTN (hypertension)  Depression  AR (allergic rhinitis)  ADD (attention deficit disorder)  Meds ordered this encounter  Medications  . traZODone (DESYREL) 50 MG tablet    Sig: TAKE 1 TABLET AT BEDTIME    Dispense:  90 tablet    Refill:  3  . losartan-hydrochlorothiazide (HYZAAR) 100-12.5 MG per tablet    Sig: Take 1 tablet by mouth daily.    Dispense:  90  tablet    Refill:  3  . FLUoxetine (PROZAC) 40 MG capsule    Sig: Take 1 capsule (40 mg total) by mouth daily.    Dispense:  90 capsule    Refill:  3  . atorvastatin (LIPITOR) 40 MG tablet    Sig: TAKE 1 TABLET EVERY DAY    Dispense:  90 tablet    Refill:  3  . atomoxetine (STRATTERA) 100 MG capsule    Sig: TAKE ONE CAPSULE EVERY DAY    Dispense:  90 capsule    Refill:  3  stop meloxicam for  now  I personally performed the services described in this documentation, which was scribed in my presence. The recorded information has been reviewed and is accurate.   Addendum: Lab results Creatinine normal at 1.05 but both AST and ALT are slightly increased along with alkaline phosphatase 157 S. body negative. Hep B surface antigen negative Sedimentation rate 99  Rheumatoid factor normal  Will schedule right upper quadrant ultrasound next

## 2014-01-07 ENCOUNTER — Ambulatory Visit (INDEPENDENT_AMBULATORY_CARE_PROVIDER_SITE_OTHER): Payer: BC Managed Care – PPO | Admitting: Family Medicine

## 2014-01-07 VITALS — BP 120/64 | HR 100 | Temp 97.6°F | Resp 20 | Ht 60.25 in | Wt 138.4 lb

## 2014-01-07 DIAGNOSIS — R700 Elevated erythrocyte sedimentation rate: Secondary | ICD-10-CM

## 2014-01-07 DIAGNOSIS — R350 Frequency of micturition: Secondary | ICD-10-CM

## 2014-01-07 DIAGNOSIS — M791 Myalgia, unspecified site: Secondary | ICD-10-CM

## 2014-01-07 DIAGNOSIS — IMO0001 Reserved for inherently not codable concepts without codable children: Secondary | ICD-10-CM

## 2014-01-07 LAB — POCT URINALYSIS DIPSTICK
Bilirubin, UA: NEGATIVE
Glucose, UA: NEGATIVE
Ketones, UA: NEGATIVE
Leukocytes, UA: NEGATIVE
NITRITE UA: NEGATIVE
PH UA: 6
PROTEIN UA: NEGATIVE
RBC UA: NEGATIVE
UROBILINOGEN UA: 0.2

## 2014-01-07 LAB — POCT UA - MICROSCOPIC ONLY
Bacteria, U Microscopic: NEGATIVE
Casts, Ur, LPF, POC: NEGATIVE
Crystals, Ur, HPF, POC: NEGATIVE
Mucus, UA: NEGATIVE
RBC, urine, microscopic: NEGATIVE
WBC, Ur, HPF, POC: NEGATIVE
Yeast, UA: NEGATIVE

## 2014-01-07 MED ORDER — METHYLPREDNISOLONE (PAK) 4 MG PO TABS: ORAL_TABLET | ORAL | Status: DC

## 2014-01-07 NOTE — Progress Notes (Signed)
Subjective:    Patient ID: Sheryl [name], female    DOB: [birth year], 63 y.o.   MRN: [id number] This chart was scribed for Robyn Haber, MD by Anastasia Pall, ED Scribe. This patient was seen in room 13 and the patient's care was started at 8:55 PM.  Chief Complaint  Patient presents with   Urinary Frequency    x 2 weeks-also having pain in the lower abd-(comes and goes)   HPI Sheryl [name] is a 63 y.o. female Pt presents with urinary frequency and intermittent, lower abdominal pain, onset 2 weeks ago, with associated generalized body aches. She reports she doesn't feel well at all, has been missing work, but never misses work. She denies fever, and any other associated symptoms.   She reports being seen here 4 days ago by Dr. Laney Pastor. She had been on Augmentin for vertigo, Augmentin. She states at that visit Dr. Laney Pastor told her she no longer had a bladder infection. She states he took her off of Meloxicam and ordered an [place]. She made the appointment, but is unable to see them until next week.   PCP - Leandrew Koyanagi, MD  Patient Active Problem List   Diagnosis Date Noted   Nicotine addiction 06/06/2012   HTN (hypertension) 06/04/2012   Hyperlipidemia 06/04/2012   Depression 06/04/2012   ADD (attention deficit disorder) 06/04/2012   AR (allergic rhinitis) 06/04/2012   Prior to Admission medications   Medication Sig Start Date End Date Taking? Authorizing Provider  albuterol (PROVENTIL HFA;VENTOLIN HFA) 108 (90 BASE) MCG/ACT inhaler Inhale 1-2 puffs into the lungs every 6 (six) hours as needed for wheezing or shortness of breath. 12/25/13  Yes [name], MD  atomoxetine (STRATTERA) 100 MG capsule TAKE ONE CAPSULE EVERY DAY 01/03/14  Yes Leandrew Koyanagi, MD  atorvastatin (LIPITOR) 40 MG tablet TAKE 1 TABLET EVERY DAY 01/03/14  Yes Leandrew Koyanagi, MD  clobetasol cream (TEMOVATE) 0.05 % Apply topically 2 (two) times daily. 06/04/12  Yes Leandrew Koyanagi, MD    FLUoxetine (PROZAC) 40 MG capsule Take 1 capsule (40 mg total) by mouth daily. 01/03/14  Yes Leandrew Koyanagi, MD  fluticasone (FLONASE) 50 MCG/ACT nasal spray Place 2 sprays into both nostrils daily. 08/27/13  Yes Gay Filler Copland, MD  losartan-hydrochlorothiazide (HYZAAR) 100-12.5 MG per tablet Take 1 tablet by mouth daily. 01/03/14  Yes Leandrew Koyanagi, MD  meloxicam (MOBIC) 15 MG tablet TAKE 1 TABLET EVERY DAY 07/19/13  Yes Leandrew Koyanagi, MD  traZODone (DESYREL) 50 MG tablet TAKE 1 TABLET AT BEDTIME 01/03/14  Yes Leandrew Koyanagi, MD  amoxicillin-clavulanate (AUGMENTIN) 875-125 MG per tablet Take 1 tablet by mouth 2 (two) times daily. 12/25/13   [name], MD  meclizine (ANTIVERT) 25 MG tablet Take 0.5-1 tablets (12.5-25 mg total) by mouth 3 (three) times daily as needed for dizziness or nausea. 12/25/13   [name], MD   Review of Systems  Constitutional: Negative for fever.  Gastrointestinal: Positive for abdominal pain (lower).  Genitourinary: Positive for frequency.  Musculoskeletal: Positive for myalgias (generalized body aches).      Objective:   Physical Exam BP 120/64   Pulse 100   Temp(Src) 97.6 F (36.4 C) (Oral)   Resp 20   Ht 5' 0.25" (1.53 m)   Wt 138 lb 6.4 oz (62.778 kg)   BMI 26.82 kg/m2   SpO2 97%  Nursing note and vitals reviewed. Constitutional: He is oriented to person, place, and time. He  appears well-developed and well-nourished. No distress.  HENT:  Head: Normocephalic and atraumatic.  Eyes: EOM are normal.  Neck: Neck supple.  Cardiovascular: Normal rate.   Pulmonary/Chest: Effort normal. No respiratory distress.  Musculoskeletal: Normal range of motion.  Neurological: He is alert and oriented to person, place, and time.  Skin: Skin is warm and dry.  Psychiatric: He has a normal mood and affect. His behavior is normal.  Results for orders placed in visit on 01/07/14  POCT URINALYSIS DIPSTICK      Result Value Ref Range   Color, UA light  yellow     Clarity, UA clear     Glucose, UA neg     Bilirubin, UA neg     Ketones, UA neg     Spec Grav, UA <=1.005     Blood, UA neg     pH, UA 6.0     Protein, UA neg     Urobilinogen, UA 0.2     Nitrite, UA neg     Leukocytes, UA Negative    POCT UA - MICROSCOPIC ONLY      Result Value Ref Range   WBC, Ur, HPF, POC neg     RBC, urine, microscopic neg     Bacteria, U Microscopic neg     Mucus, UA neg     Epithelial cells, urine per micros 0-1     Crystals, Ur, HPF, POC neg     Casts, Ur, LPF, POC neg     Yeast, UA neg         Assessment & Plan:   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 She has a rather abrupt onset 3 weeks ago of diffuse muscle aches and fatigue. Although she has had a headache or visual disturbance, her joints are sore and she has a sedimentation rate of 99.  Have asked her to come back within the week. She also does have a gallbladder study done because of her elevated LFTs. I started her on Medrol.

## 2014-01-07 NOTE — Patient Instructions (Signed)
Polymyalgia Rheumatica Polymyalgia rheumatica (also called PMR or polymyalgia) is a rheumatologic (arthritic) condition that causes pain and morning stiffness in your neck, shoulders, and hips. It is an inflammatory condition. In some people, inflammation of certain structures in the shoulder, hips, or other joints can be seen on special testing. It does not cause joint destruction, as occurs in other arthritic conditions. It usually occurs after 64 years of age, and is more common as you age. It can be confused with several other diseases, but it is usually easily treated. People with PMR often have, or can develop, a more severe rheumatologic condition called giant cell arteritis (also called CGA or temporal arteritis).  CAUSES  The exact cause of PMR is not known.   There are genetic factors involved.  Viruses have been suspected in the cause of PMR. This has not been proven. SYMPTOMS   Aching, pain, and morning stiffness your neck, both shoulders, or both hips.  Symptoms usually start slowly and build gradually.  Morning stiffness usually lasts at least 30 minutes.  Swelling and tenderness in other joints of the arms, hands, legs, and feet may occur.  Swelling and inflammation in the wrists can cause nerve inflammation at the wrist (carpal tunnel syndrome).  You may also have low grade fever, fatigue, weakness, decreased appetite and weight loss. DIAGNOSIS   Your caregiver may suspect that you have PMR based on your description of your symptoms and on your exam.  Your caregiver will examine you to be sure you do not have diseases that can be confused with PMR. These diseases include rheumatoid arthritis, fibromyalgia, or thyroid disease.  Your caregiver should check for signs of giant cell arteritis. This can cause serious complications such as blindness.  Lab tests can help confirm that you have PMR and not other diseases, but are sometimes inconclusive.  X-rays cannot show PMR.  However, it can identify other diseases like rheumatoid arthritis. Your caregiver may have you see a specialist in arthritis and inflammatory diseases (rheumatologist). TREATMENT  The goal of treatment is relief of symptoms. Treatment does not shorten the course of the illness or prevent complications. With proper treatment, you usually feel better almost right away.   The initial treatment of PMR is usually a cortisone (steroid) medication. Your caregiver will help determine a starting dose. The dose is gradually reduced every few weeks to months. Treatment usually lasts one to three years.  Other stronger medications are rarely needed. They will only be prescribed if your symptoms do not get better on cortisone medication alone, or if they recur as the dose is reduced.  Cortisone medication can have different side effects. With the doses of cortisone needed for PMR, the side effects can affect bones and joints, blood sugar control in diabetes, and mood changes. Discuss this with your caregiver.  Your caregiver will evaluate you regularly during your treatment. They will do this in order to assess progress and to check for complications of the illness or treatment.  Physical therapy is sometimes useful. This is especially true if your joints are still stiff after other symptoms have improved. HOME CARE INSTRUCTIONS   Follow your caregiver's instructions. Do not change your dose of cortisone medication on your own.  Keep your appointments for follow-up lab tests and caregiver visits. Your lab tests need to be monitored. You must get checked periodically for giant cell arteritis.  Follow your caregiver's guidance regarding physical activity (usually no restrictions are needed) or physical therapy.  Your caregiver  may have instructions to prevent or check for side effects from cortisone medication (including bone density testing or treatment). Follow their instructions carefully. SEEK MEDICAL  CARE IF:   You develop any side effects from treatment. Side effects can include:  Elevated blood pressure.  High blood sugar (or worsening of diabetes, if you are diabetic).  Difficulty fighting off infections.  Weight gain.  Weakness of the bones (osteoporosis).  Your aches, pains, morning stiffness, or other symptoms get worse with time. This is especially true after your dose of cortisone is reduced.  You develop new joint symptoms (pain, swelling, etc.) SEEK IMMEDIATE MEDICAL CARE IF:   You develop a severe headache.  You start vomiting.  You have problems with your vision.  You have an oral temperature above 102 F (38.9 C), not controlled by medicine. Document Released: 09/13/2004 Document Revised: 07/23/2012 Document Reviewed: 12/27/2008 [facility] Patient Information 2014 [place], [place].

## 2014-01-12 ENCOUNTER — Ambulatory Visit
Admission: RE | Admit: 2014-01-12 | Discharge: 2014-01-12 | Disposition: A | Discharge status: Home / Self Care | Payer: BC Managed Care – PPO | Source: Ambulatory Visit | Attending: Internal Medicine | Admitting: Internal Medicine

## 2014-01-12 DIAGNOSIS — R945 Abnormal results of liver function studies: Secondary | ICD-10-CM

## 2014-01-12 DIAGNOSIS — R7989 Other specified abnormal findings of blood chemistry: Secondary | ICD-10-CM

## 2014-01-18 ENCOUNTER — Other Ambulatory Visit: Payer: BC Managed Care – PPO

## 2014-03-04 ENCOUNTER — Ambulatory Visit (INDEPENDENT_AMBULATORY_CARE_PROVIDER_SITE_OTHER): Payer: BC Managed Care – PPO | Admitting: Emergency Medicine

## 2014-03-04 VITALS — BP 106/78 | HR 93 | Temp 98.1°F | Resp 18 | Ht 60.5 in | Wt 135.6 lb

## 2014-03-04 DIAGNOSIS — M129 Arthropathy, unspecified: Secondary | ICD-10-CM

## 2014-03-04 DIAGNOSIS — M1990 Unspecified osteoarthritis, unspecified site: Secondary | ICD-10-CM

## 2014-03-04 NOTE — Progress Notes (Signed)
[facility] [address], [place] [address] [phone]  Date:  03/04/2014   Name:  Sheryl [name]   DOB:  1951-03-06   MRN:  [id number]  PCP:  Leandrew Koyanagi, MD    Chief Complaint: Hand Pain and Foot Pain   History of Present Illness:  Sheryl [name] is a 63 y.o. very pleasant female patient who presents with the following:  Taking lipitor for HLD and is concerned about her hands and feet and joint pain.  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  She has pain in her feet particularly in the morning.  She said she was here in April with "polyarthralgia rheumatica" although there is no mention of this in her previous visit records.  No improvement with over the counter medications or other home remedies. Denies other complaint or health concern today.   Patient Active Problem List   Diagnosis Date Noted  . Nicotine addiction 06/06/2012  . HTN (hypertension) 06/04/2012  . Hyperlipidemia 06/04/2012  . Depression 06/04/2012  . ADD (attention deficit disorder) 06/04/2012  . AR (allergic rhinitis) 06/04/2012    Past Medical History  Diagnosis Date  . Allergy   . Arthritis   . Osteoporosis     Past Surgical History  Procedure Laterality Date  . Joint replacement    . Replacement total knee  2013    History  Substance Use Topics  . Smoking status: Current Every Day Smoker  . Smokeless tobacco: Not on file  . Alcohol Use: No    History reviewed. No pertinent family history.  No Known Allergies  Medication list has been reviewed and updated.  Current Outpatient Prescriptions on File Prior to Visit  Medication Sig Dispense Refill  . albuterol (PROVENTIL HFA;VENTOLIN HFA) 108 (90 BASE) MCG/ACT inhaler Inhale 1-2 puffs into the lungs every 6 (six) hours as needed for wheezing or shortness of breath.  1 Inhaler  1  . atomoxetine (STRATTERA) 100 MG capsule TAKE ONE CAPSULE EVERY DAY  90 capsule  3  . atorvastatin  (LIPITOR) 40 MG tablet TAKE 1 TABLET EVERY DAY  90 tablet  3  . clobetasol cream (TEMOVATE) 0.05 % Apply topically 2 (two) times daily.  30 g  5  . FLUoxetine (PROZAC) 40 MG capsule Take 1 capsule (40 mg total) by mouth daily.  90 capsule  3  . fluticasone (FLONASE) 50 MCG/ACT nasal spray Place 2 sprays into both nostrils daily.  16 g  6  . losartan-hydrochlorothiazide (HYZAAR) 100-12.5 MG per tablet Take 1 tablet by mouth daily.  90 tablet  3  . meloxicam (MOBIC) 15 MG tablet TAKE 1 TABLET EVERY DAY  90 tablet  3  . traZODone (DESYREL) 50 MG tablet TAKE 1 TABLET AT BEDTIME  90 tablet  3  . meclizine (ANTIVERT) 25 MG tablet Take 0.5-1 tablets (12.5-25 mg total) by mouth 3 (three) times daily as needed for dizziness or nausea.  30 tablet  0  . methylPREDNIsolone (MEDROL DOSPACK) 4 MG tablet follow package directions  21 tablet  0   No current facility-administered medications on file prior to visit.    Review of Systems:  As per HPI, otherwise negative.    Physical Examination: Filed Vitals:   03/04/14 1638  BP: 106/78  Pulse: 93  Temp: 98.1 F (36.7 C)  Resp: 18   Filed Vitals:   03/04/14 1638  Height: 5' 0.5" (1.537 m)  Weight: 135 lb 9.6 oz (  61.508 kg)   Body mass index is 26.04 kg/(m^2). Ideal Body Weight: Weight in (lb) to have BMI = 25: 129.9   GEN: WDWN, NAD, Non-toxic, Alert & Oriented x 3 HEENT: Atraumatic, Normocephalic.  Ears and Nose: No external deformity. EXTR: No clubbing/cyanosis/edema.  No joint abnormalities hands. NEURO: Normal gait.  PSYCH: Normally interactive. Conversant. Not depressed or anxious appearing.  Calm demeanor.    Assessment and Plan: Potential adverse reaction to Lipitor Stop lipitor Follow up in 3 months for repeat evaluation   Signed,  Ellison Carwin, MD

## 2014-03-08 ENCOUNTER — Other Ambulatory Visit: Payer: Self-pay | Admitting: Internal Medicine

## 2014-05-25 ENCOUNTER — Telehealth: Payer: Self-pay

## 2014-05-25 NOTE — Telephone Encounter (Signed)
Patient wants to know if Dr. Laney Pastor will allow her to go back on ritalin- pt states she has taken this in the past but Dr. Laney Pastor changed her to strattera due to BP issues. Patient states prior Josem Kaufmann must be completed before pt can pick up her strattera medication. CB [phone]

## 2014-05-26 NOTE — Telephone Encounter (Signed)
HTN = no stimulants! So go ahead with prior Clinical biochemist

## 2014-05-27 NOTE — Telephone Encounter (Signed)
PA done on phone and received approval from 05/06/14 - 05/27/15, case #[id number]. Notified pharm and pt.

## 2014-07-16 ENCOUNTER — Ambulatory Visit (INDEPENDENT_AMBULATORY_CARE_PROVIDER_SITE_OTHER): Payer: BC Managed Care – PPO

## 2014-07-16 ENCOUNTER — Ambulatory Visit (INDEPENDENT_AMBULATORY_CARE_PROVIDER_SITE_OTHER): Payer: BC Managed Care – PPO | Admitting: Internal Medicine

## 2014-07-16 VITALS — BP 120/68 | HR 91 | Temp 97.5°F | Resp 16 | Ht 61.5 in | Wt 133.8 lb

## 2014-07-16 DIAGNOSIS — M545 Low back pain, unspecified: Secondary | ICD-10-CM | POA: Insufficient documentation

## 2014-07-16 DIAGNOSIS — J302 Other seasonal allergic rhinitis: Secondary | ICD-10-CM

## 2014-07-16 DIAGNOSIS — F329 Major depressive disorder, single episode, unspecified: Secondary | ICD-10-CM

## 2014-07-16 DIAGNOSIS — M79641 Pain in right hand: Secondary | ICD-10-CM | POA: Insufficient documentation

## 2014-07-16 DIAGNOSIS — R700 Elevated erythrocyte sedimentation rate: Secondary | ICD-10-CM

## 2014-07-16 DIAGNOSIS — F32A Depression, unspecified: Secondary | ICD-10-CM

## 2014-07-16 DIAGNOSIS — F17210 Nicotine dependence, cigarettes, uncomplicated: Secondary | ICD-10-CM

## 2014-07-16 DIAGNOSIS — M79642 Pain in left hand: Secondary | ICD-10-CM

## 2014-07-16 DIAGNOSIS — F988 Other specified behavioral and emotional disorders with onset usually occurring in childhood and adolescence: Secondary | ICD-10-CM

## 2014-07-16 DIAGNOSIS — R748 Abnormal levels of other serum enzymes: Secondary | ICD-10-CM

## 2014-07-16 DIAGNOSIS — F909 Attention-deficit hyperactivity disorder, unspecified type: Secondary | ICD-10-CM

## 2014-07-16 DIAGNOSIS — E785 Hyperlipidemia, unspecified: Secondary | ICD-10-CM

## 2014-07-16 DIAGNOSIS — I10 Essential (primary) hypertension: Secondary | ICD-10-CM

## 2014-07-16 DIAGNOSIS — M25519 Pain in unspecified shoulder: Secondary | ICD-10-CM

## 2014-07-16 DIAGNOSIS — R062 Wheezing: Secondary | ICD-10-CM

## 2014-07-16 LAB — LIPID PANEL
CHOL/HDL RATIO: 4.5 ratio
Cholesterol: 224 mg/dL — ABNORMAL HIGH (ref 0–200)
HDL: 50 mg/dL (ref >39)
LDL CALC: 121 mg/dL — AB (ref 0–99)
Triglycerides: 263 mg/dL — ABNORMAL HIGH (ref <150)
VLDL: 53 mg/dL — AB (ref 0–40)

## 2014-07-16 LAB — POCT CBC
Granulocyte percent: 54.7 %G (ref 37–80)
HCT, POC: 38.2 % (ref 37.7–47.9)
Hemoglobin: 12.5 g/dL (ref 12.2–16.2)
LYMPH, POC: 2.6 (ref 0.6–3.4)
MCH, POC: 31.2 pg (ref 27–31.2)
MCHC: 32.8 g/dL (ref 31.8–35.4)
MCV: 95 fL (ref 80–97)
MID (cbc): 0.6 (ref 0–0.9)
MPV: 7.2 fL (ref 0–99.8)
POC GRANULOCYTE: 3.9 (ref 2–6.9)
POC LYMPH %: 36.7 % (ref 10–50)
POC MID %: 8.6 %M (ref 0–12)
Platelet Count, POC: 325 10*3/uL (ref 142–424)
RBC: 4.02 M/uL — AB (ref 4.04–5.48)
RDW, POC: 12.8 %
WBC: 7.1 10*3/uL (ref 4.6–10.2)

## 2014-07-16 LAB — COMPREHENSIVE METABOLIC PANEL
ALK PHOS: 123 U/L — AB (ref 39–117)
ALT: 44 U/L — ABNORMAL HIGH (ref 0–35)
AST: 36 U/L (ref 0–37)
Albumin: 4 g/dL (ref 3.5–5.2)
BILIRUBIN TOTAL: 0.4 mg/dL (ref 0.2–1.2)
BUN: 23 mg/dL (ref 6–23)
CO2: 27 mEq/L (ref 19–32)
CREATININE: 1.23 mg/dL — AB (ref 0.50–1.10)
Calcium: 9.6 mg/dL (ref 8.4–10.5)
Chloride: 103 mEq/L (ref 96–112)
Glucose, Bld: 100 mg/dL — ABNORMAL HIGH (ref 70–99)
Potassium: 4.3 mEq/L (ref 3.5–5.3)
Sodium: 138 mEq/L (ref 135–145)
TOTAL PROTEIN: 7.1 g/dL (ref 6.0–8.3)

## 2014-07-16 LAB — TSH: TSH: 0.797 u[IU]/mL (ref 0.350–4.500)

## 2014-07-16 LAB — POCT SEDIMENTATION RATE: POCT SED RATE: 69 mm/hr — AB (ref 0–22)

## 2014-07-16 MED ORDER — LOSARTAN POTASSIUM-HCTZ 100-12.5 MG PO TABS: 1.0000 | ORAL_TABLET | Freq: Every day | ORAL | Status: DC

## 2014-07-16 MED ORDER — MELOXICAM 15 MG PO TABS: ORAL_TABLET | ORAL | Status: DC

## 2014-07-16 MED ORDER — ALBUTEROL SULFATE HFA 108 (90 BASE) MCG/ACT IN AERS: 1.0000 | INHALATION_SPRAY | Freq: Four times a day (QID) | RESPIRATORY_TRACT | Status: DC | PRN

## 2014-07-16 MED ORDER — PREDNISONE 20 MG PO TABS: ORAL_TABLET | ORAL | Status: DC

## 2014-07-16 MED ORDER — TRAZODONE HCL 50 MG PO TABS: ORAL_TABLET | ORAL | Status: DC

## 2014-07-16 MED ORDER — FLUOXETINE HCL 40 MG PO CAPS: 40.0000 mg | ORAL_CAPSULE | Freq: Every day | ORAL | Status: DC

## 2014-07-16 MED ORDER — FLUTICASONE PROPIONATE 50 MCG/ACT NA SUSP: 2.0000 | Freq: Every day | NASAL | Status: DC

## 2014-07-16 MED ORDER — MECLIZINE HCL 25 MG PO TABS: 12.5000 mg | ORAL_TABLET | Freq: Three times a day (TID) | ORAL | Status: DC | PRN

## 2014-07-16 MED ORDER — ATOMOXETINE HCL 100 MG PO CAPS: ORAL_CAPSULE | ORAL | Status: DC

## 2014-07-16 MED ORDER — CYCLOBENZAPRINE HCL 10 MG PO TABS: 10.0000 mg | ORAL_TABLET | Freq: Three times a day (TID) | ORAL | Status: DC | PRN

## 2014-07-16 NOTE — Addendum Note (Signed)
Addended by: Leandrew Koyanagi on: 07/16/2014 10:13 PM   Modules accepted: Level of Service

## 2014-07-16 NOTE — Progress Notes (Addendum)
Subjective:    Patient ID: Sheryl [name], female    DOB: 12-Jun-1951, 63 y.o.   MRN: [id number] This chart was scribed for Tami Lin, MD by Girtha Hake, ED Scribe. The patient was seen in Room 9. The patient's care was started at 11:53 AM.   HPI HPI Comments: Sheryl [name] is a 63 y.o. female who presents today complaining of chronic, constant lower back pain beginning one year ago. Intermittent without permanent neurological features. Intensification recently= Patient reports that the pain has recently begun radiating down her left leg. She states that the pain was recently exacerbated by a long car trip to [place].  For the past several days she has been limited almost no activity.  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 There is a prior remote history of osteoporosis.  She also is late in returning for follow-up of her routine medical problems and refilling of her medications Patient Active Problem List   Diagnosis Date Noted  . Lumbago 07/16/2014  . Bilateral hand pain 07/16/2014  . Nicotine addiction----she is not yet ready to quit and has made little progress with cessation efforts  06/06/2012  . HTN (hypertension) 06/04/2012  . Hyperlipidemia----stopped her statin several months ago because of her muscle or joint pain  06/04/2012  . Depression--- stable recently  06/04/2012  . ADD (attention deficit disorder)----continues with Strattera  06/04/2012  . AR (allergic rhinitis) 06/04/2012  Her abnormal liver functions in May 2015 were screened with no evidence of hepatitis//ultrasound of the right upper quadrant was ordered but never completed    Review of Systems  Constitutional: Negative for  fever and unexpected weight change.  Respiratory: Negative for shortness of breath.        No recent problems with wheezing or need for albuterol Continues to smoke  Cardiovascular: Negative for chest pain, palpitations and leg swelling.  Gastrointestinal: Negative for abdominal pain.  Genitourinary: Negative for urgency, frequency and difficulty urinating.  Musculoskeletal: Positive for myalgias, back pain, joint swelling, arthralgias, neck pain and neck stiffness.  Skin: Negative for rash.  Neurological: Negative for tremors, weakness and headaches.   her attention deficit problems are controlled by Strattera for the most part Status post knee replacement in 2013 and is stable    Objective:   Physical Exam  Constitutional: She is oriented to person, place, and time. She appears well-developed and well-nourished. No distress.  Eyes: EOM are normal. Pupils are equal, round, and reactive to light.  Neck: No thyromegaly present.  Cardiovascular: Normal rate, regular rhythm, normal heart sounds and intact distal pulses.   No murmur heard. Pulmonary/Chest: Effort normal and breath sounds normal.  Musculoskeletal: She exhibits no edema.  Tender all over the lumber area. Pain worse with flexion and rotation. Straight leg raise negative bilaterally. DTR'ss symmetrical. No distal sensory or motor losses.   Tender along the paraspinous muscles from neck to lower back generally. Tender in both trapezii. Neck has adequate ROM. Upper extremity reflexes are normal.  Swelling of the DIP's in both hands with painful grip bilaterally.   Neurological: She is alert and oriented to person, place, and time. No cranial nerve deficit.  Nursing note and vitals reviewed.   [facility] reading (PRIMARY) by  Dr.Anel Purohit=bony prolif and degen chges at L4-5 abd aortic calcification///demineraliz  Results for orders placed or performed in visit on 07/16/14  Comprehensive metabolic panel  Result Value Ref Range    Sodium 138 135 - 145 mEq/L   Potassium 4.3 3.5 - 5.3 mEq/L   Chloride 103 96 - 112 mEq/L   CO2 27 19 - 32 mEq/L   Glucose, Bld 100 (H) 70 - 99 mg/dL   BUN 23 6 - 23 mg/dL   Creat 1.23 (H) 0.50 - 1.10 mg/dL   Total Bilirubin 0.4 0.2 - 1.2 mg/dL   Alkaline Phosphatase 123 (H) 39 - 117 U/L   AST 36 0 - 37 U/L   ALT 44 (H) 0 - 35 U/L   Total Protein 7.1 6.0 - 8.3 g/dL   Albumin 4.0 3.5 - 5.2 g/dL   Calcium 9.6 8.4 - 10.5 mg/dL  Lipid panel  Result Value Ref Range   Cholesterol 224 (H) 0 - 200 mg/dL   Triglycerides 263 (H) <150 mg/dL   HDL 50 >39 mg/dL   Total CHOL/HDL Ratio 4.5 Ratio   VLDL 53 (H) 0 - 40 mg/dL   LDL Cholesterol 121 (H) 0 - 99 mg/dL  TSH  Result Value Ref Range   TSH 0.797 0.350 - 4.500 uIU/mL  POCT CBC  Result Value Ref Range   WBC 7.1 4.6 - 10.2 K/uL   Lymph, poc 2.6 0.6 - 3.4   POC LYMPH PERCENT 36.7 10 - 50 %L   MID (cbc) 0.6 0 - 0.9   POC MID % 8.6 0 - 12 %M   POC Granulocyte 3.9 2 - 6.9   Granulocyte percent 54.7 37 - 80 %G   RBC 4.02 (A) 4.04 - 5.48 M/uL   Hemoglobin 12.5 12.2 - 16.2 g/dL   HCT, POC 38.2 37.7 - 47.9 %   MCV 95.0 80 - 97 fL   MCH, POC 31.2 27 - 31.2 pg   MCHC 32.8 31.8 - 35.4 g/dL   RDW, POC 12.8 %   Platelet Count, POC 325 142 - 424 K/uL   MPV 7.2 0 - 99.8 fL  POCT SEDIMENTATION RATE  Result Value Ref Range   POCT SED RATE 69 (A) 0 - 22 mm/hr         Assessment & Plan:   I have completed the patient encounter in its entirety as documented by the scribe, with editing by me where necessary. Sheryl [name] P. Laney Pastor, M.D.  Low back pain -chronic with exacerbation/xrays with degen changes Pain in joint, shoulder region  Bilateral hand pain Elevated sed rate  Elevated alkaline phosphatase level   She needs full rheumatological evaluation-her rheumatoid factor was negative in the spring of 2015 when sedimentation rate  was 99//the elevated alkaline phosphatase could be a bony origin and possibly represent Paget's  disease  Hyperlipidemia -at some point we will restart medication but not until her joint problems are cleared  Essential hypertension -no change in medication  Depression - no change of medication  Other seasonal allergic rhinitis-no changes  ADD (attention deficit disorder)-continue Strattera  Wheezing -no recent episodes//related to smoking  Cigarette nicotine dependence -needs cessation  Meds ordered this encounter  Medications  . atomoxetine (STRATTERA) 100 MG capsule    Sig: TAKE ONE CAPSULE EVERY DAY    Dispense:  90 capsule    Refill:  3  . meloxicam (MOBIC) 15 MG tablet    Sig: TAKE 1 TABLET EVERY DAY    Dispense:  90  tablet    Refill:  3  . traZODone (DESYREL) 50 MG tablet    Sig: TAKE 1 TABLET AT BEDTIME    Dispense:  90 tablet    Refill:  3  . FLUoxetine (PROZAC) 40 MG capsule    Sig: Take 1 capsule (40 mg total) by mouth daily.    Dispense:  90 capsule    Refill:  3  . fluticasone (FLONASE) 50 MCG/ACT nasal spray    Sig: Place 2 sprays into both nostrils daily.    Dispense:  16 g    Refill:  6  . albuterol (PROVENTIL HFA;VENTOLIN HFA) 108 (90 BASE) MCG/ACT inhaler    Sig: Inhale 1-2 puffs into the lungs every 6 (six) hours as needed for wheezing or shortness of breath.    Dispense:  1 Inhaler    Refill:  3  . losartan-hydrochlorothiazide (HYZAAR) 100-12.5 MG per tablet    Sig: Take 1 tablet by mouth daily.    Dispense:  90 tablet    Refill:  3  . meclizine (ANTIVERT) 25 MG tablet    Sig: Take 0.5-1 tablets (12.5-25 mg total) by mouth 3 (three) times daily as needed for dizziness or nausea.    Dispense:  30 tablet    Refill:  2  . predniSONE (DELTASONE) 20 MG tablet    Sig: 3/3/3/3/2/2/2/2/1/1/1/1 single daily dose for 12 days    Dispense:  24 tablet    Refill:  0  . cyclobenzaprine (FLEXERIL) 10 MG tablet    Sig: Take 1 tablet (10 mg total) by mouth 3 (three) times daily as needed for muscle spasms. Especially at bedtime    Dispense:  30 tablet     Refill:  0   Will need DEXA scan Will need mammogram Old chart/paper chart-needs reviewing for last colonoscopy, tetanus  ultrasound of the right upper quadrant 5/15 was wnl--abn lfts continue

## 2014-07-21 ENCOUNTER — Other Ambulatory Visit: Payer: Self-pay | Admitting: *Deleted

## 2014-07-21 DIAGNOSIS — L309 Dermatitis, unspecified: Secondary | ICD-10-CM

## 2014-07-21 MED ORDER — CLOBETASOL PROPIONATE 0.05 % EX CREA: TOPICAL_CREAM | Freq: Two times a day (BID) | CUTANEOUS | Status: DC

## 2014-07-24 ENCOUNTER — Encounter: Payer: Self-pay | Admitting: Internal Medicine

## 2014-08-01 ENCOUNTER — Other Ambulatory Visit: Payer: Self-pay | Admitting: Internal Medicine

## 2014-08-05 ENCOUNTER — Ambulatory Visit (INDEPENDENT_AMBULATORY_CARE_PROVIDER_SITE_OTHER): Payer: BC Managed Care – PPO | Admitting: Physician Assistant

## 2014-08-05 VITALS — BP 112/68 | HR 96 | Temp 97.8°F | Resp 18 | Ht 60.0 in | Wt 135.6 lb

## 2014-08-05 DIAGNOSIS — M5442 Lumbago with sciatica, left side: Secondary | ICD-10-CM

## 2014-08-05 MED ORDER — PREDNISONE 20 MG PO TABS: ORAL_TABLET | ORAL | Status: DC

## 2014-08-05 MED ORDER — CYCLOBENZAPRINE HCL 10 MG PO TABS: 10.0000 mg | ORAL_TABLET | Freq: Three times a day (TID) | ORAL | Status: DC | PRN

## 2014-08-05 NOTE — Patient Instructions (Signed)
Sciatica Sciatica is pain, weakness, numbness, or tingling along the path of the sciatic nerve. The nerve starts in the lower back and runs down the back of each leg. The nerve controls the muscles in the lower leg and in the back of the knee, while also providing sensation to the back of the thigh, lower leg, and the sole of your foot. Sciatica is a symptom of another medical condition. For instance, nerve damage or certain conditions, such as a herniated disk or bone spur on the spine, pinch or put pressure on the sciatic nerve. This causes the pain, weakness, or other sensations normally associated with sciatica. Generally, sciatica only affects one side of the body. CAUSES   Herniated or slipped disc.  Degenerative disk disease.  A pain disorder involving the narrow muscle in the buttocks (piriformis syndrome).  Pelvic injury or fracture.  Pregnancy.  Tumor (rare). SYMPTOMS  Symptoms can vary from mild to very severe. The symptoms usually travel from the low back to the buttocks and down the back of the leg. Symptoms can include:  Mild tingling or dull aches in the lower back, leg, or hip.  Numbness in the back of the calf or sole of the foot.  Burning sensations in the lower back, leg, or hip.  Sharp pains in the lower back, leg, or hip.  Leg weakness.  Severe back pain inhibiting movement. These symptoms may get worse with coughing, sneezing, laughing, or prolonged sitting or standing. Also, being overweight may worsen symptoms. DIAGNOSIS  Your caregiver will perform a physical exam to look for common symptoms of sciatica. He or she may ask you to do certain movements or activities that would trigger sciatic nerve pain. Other tests may be performed to find the cause of the sciatica. These may include:  Blood tests.  X-rays.  Imaging tests, such as an MRI or CT scan. TREATMENT  Treatment is directed at the cause of the sciatic pain. Sometimes, treatment is not necessary  and the pain and discomfort goes away on its own. If treatment is needed, your caregiver may suggest:  Over-the-counter medicines to relieve pain.  Prescription medicines, such as anti-inflammatory medicine, muscle relaxants, or narcotics.  Applying heat or ice to the painful area.  Steroid injections to lessen pain, irritation, and inflammation around the nerve.  Reducing activity during periods of pain.  Exercising and stretching to strengthen your abdomen and improve flexibility of your spine. Your caregiver may suggest losing weight if the extra weight makes the back pain worse.  Physical therapy.  Surgery to eliminate what is pressing or pinching the nerve, such as a bone spur or part of a herniated disk. HOME CARE INSTRUCTIONS   Only take over-the-counter or prescription medicines for pain or discomfort as directed by your caregiver.  Apply ice to the affected area for 20 minutes, 3-4 times a day for the first 48-72 hours. Then try heat in the same way.  Exercise, stretch, or perform your usual activities if these do not aggravate your pain.  Attend physical therapy sessions as directed by your caregiver.  Keep all follow-up appointments as directed by your caregiver.  Do not wear high heels or shoes that do not provide proper support.  Check your mattress to see if it is too soft. A firm mattress may lessen your pain and discomfort. SEEK IMMEDIATE MEDICAL CARE IF:   You lose control of your bowel or bladder (incontinence).  You have increasing weakness in the lower back, pelvis, buttocks,   or legs.  You have redness or swelling of your back.  You have a burning sensation when you urinate.  You have pain that gets worse when you lie down or awakens you at night.  Your pain is worse than you have experienced in the past.  Your pain is lasting longer than 4 weeks.  You are suddenly losing weight without reason. MAKE SURE YOU:  Understand these  instructions.  Will watch your condition.  Will get help right away if you are not doing well or get worse. Document Released: 07/31/2001 Document Revised: 02/05/2012 Document Reviewed: 12/16/2011 [employer] Patient Information 2015 [employer]. This information is not intended to replace advice given to you by your health care provider. Make sure you discuss any questions you have with your health care provider.  

## 2014-08-05 NOTE — Progress Notes (Signed)
IDENTIFYING INFORMATION  Puja Caffey / DOB: Jun 23, 1951 / MRN: [id number]  The patient has HTN (hypertension); Hyperlipidemia; Depression; ADD (attention deficit disorder); AR (allergic rhinitis); Nicotine addiction; Lumbago; and Bilateral hand pain on her problem list.  SUBJECTIVE  CC: Sciatica   HPI: Ms. Sheryl [name] is a 63 y.o. y.o. female presenting for continuing back pain originally diagnosed on 11/27/25015 by Dr. Sonia Baller.  She was given a steroid dose pack and muscle relaxor.  This helped her symptoms greatly to the point that she had almost no symptoms.  She was also evaluated by orthro roughly a week or so later and was given no therapy at that time, as her symptoms had seemed to resolve at that time. She then decided to dig holes in her yard to plant some trees and her symptoms reemerged.  Her symptoms have come back now and are so bad that she "wants to cut her leg off."  She has follow up with ortho on January 6th.    She  has a past medical history of Allergy; Arthritis; and Osteoporosis.    SHe has a current medication list which includes the following prescription(s): albuterol, atomoxetine, clobetasol cream, fluoxetine, fluoxetine, fluticasone, losartan-hydrochlorothiazide, meclizine, meloxicam, prednisone, trazodone, and cyclobenzaprine.  Ms. Medal has No Known Allergies. She  reports that she has been smoking.  She does not have any smokeless tobacco history on file. She reports that she does not drink alcohol or use illicit drugs. She  reports that she currently engages in sexual activity.  The patient  has past surgical history that includes Joint replacement and Replacement total knee (2013).  Her family history is not on file.  Review of Systems  Constitutional: Negative.   Musculoskeletal: Positive for myalgias and back pain. Negative for joint pain, falls and neck pain.  Skin: Negative for itching and rash.  Neurological: Negative for dizziness.    Endo/Heme/Allergies: Negative for polydipsia.    OBJECTIVE  Blood pressure 112/68, pulse 96, temperature 97.8 F (36.6 C), temperature source Oral, resp. rate 18, height 5' (1.524 m), weight 135 lb 9.6 oz (61.508 kg), SpO2 97 %. The patient's body mass index is 26.48 kg/(m^2).  Physical Exam  Constitutional: She is oriented to person, place, and time. She appears well-developed and well-nourished. No distress.  Neck: Normal range of motion.  Musculoskeletal:       Legs: Neurological: She is alert and oriented to person, place, and time. She has normal reflexes.  Reflex Scores:      Patellar reflexes are 2+ on the right side and 2+ on the left side.      Achilles reflexes are 2+ on the right side and 2+ on the left side. Skin: Skin is warm and dry. She is not diaphoretic.  Psychiatric: She has a normal mood and affect.    No results found for this or any previous visit (from the past 24 hour(s)).  ASSESSMENT & PLAN  Merly was seen today for sciatica.  Diagnoses and associated orders for this visit:  Left-sided low back pain with left-sided sciatica - predniSONE (DELTASONE) 20 MG tablet; Take 3 PO QAM x3days, 2 PO QAM x3days, 1 PO QAM x3days - cyclobenzaprine (FLEXERIL) 10 MG tablet; Take 1 tablet (10 mg total) by mouth 3 (three) times daily as needed for muscle spasms. Especially at bedtime -     Patient amenable to not doing strenuous activity until her back fully recovers.     The patient was instructed to to call or  comeback to clinic as needed, or should symptoms warrant.  Philis Fendt, MHS, PA-C [facility] 08/05/2014 6:20 PM

## 2014-09-22 ENCOUNTER — Ambulatory Visit (INDEPENDENT_AMBULATORY_CARE_PROVIDER_SITE_OTHER): Payer: BC Managed Care – PPO

## 2014-09-22 ENCOUNTER — Ambulatory Visit (INDEPENDENT_AMBULATORY_CARE_PROVIDER_SITE_OTHER): Payer: BC Managed Care – PPO | Admitting: Family Medicine

## 2014-09-22 VITALS — BP 118/72 | HR 87 | Temp 98.3°F | Resp 16 | Ht 61.0 in | Wt 135.2 lb

## 2014-09-22 DIAGNOSIS — L03032 Cellulitis of left toe: Secondary | ICD-10-CM

## 2014-09-22 DIAGNOSIS — Z23 Encounter for immunization: Secondary | ICD-10-CM

## 2014-09-22 DIAGNOSIS — S91302A Unspecified open wound, left foot, initial encounter: Secondary | ICD-10-CM

## 2014-09-22 LAB — POCT CBC
Granulocyte percent: 55.2 %G (ref 37–80)
HCT, POC: 41.1 % (ref 37.7–47.9)
Hemoglobin: 13.1 g/dL (ref 12.2–16.2)
Lymph, poc: 3.1 (ref 0.6–3.4)
MCH, POC: 31.2 pg (ref 27–31.2)
MCHC: 31.9 g/dL (ref 31.8–35.4)
MCV: 97.7 fL — AB (ref 80–97)
MID (cbc): 0.8 (ref 0–0.9)
MPV: 6.9 fL (ref 0–99.8)
POC Granulocyte: 4.7 (ref 2–6.9)
POC LYMPH PERCENT: 35.9 %L (ref 10–50)
POC MID %: 8.9 %M (ref 0–12)
Platelet Count, POC: 353 10*3/uL (ref 142–424)
RBC: 4.21 M/uL (ref 4.04–5.48)
RDW, POC: 14.6 %
WBC: 8.5 10*3/uL (ref 4.6–10.2)

## 2014-09-22 LAB — POCT GLYCOSYLATED HEMOGLOBIN (HGB A1C): Hemoglobin A1C: 5.1

## 2014-09-22 MED ORDER — MUPIROCIN 2 % EX OINT: TOPICAL_OINTMENT | CUTANEOUS | Status: DC

## 2014-09-22 MED ORDER — DOXYCYCLINE HYCLATE 100 MG PO TABS: 100.0000 mg | ORAL_TABLET | Freq: Two times a day (BID) | ORAL | Status: DC

## 2014-09-22 NOTE — Patient Instructions (Signed)

## 2014-09-22 NOTE — Progress Notes (Signed)
Chief Complaint:  Chief Complaint  Patient presents with  . Toe Pain    left 4th toe x 2 months   . Hand Pain    Has been having trouble with wound healing on both thumbs, index finger and right middle     HPI: Sheryl [name] is a 64 y.o. female who is here for  Right foot pain, pain on her 4th toe,, she has had it for 2 months , there was  scab x 2 months then she peeled the scab off, she is afraid it may be infected No fevers or chills. She is a smoker. No diabetes that she knows of.   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   Past Medical History  Diagnosis Date  . Allergy   . Arthritis   . Osteoporosis    Past Surgical History  Procedure Laterality Date  . Joint replacement    . Replacement total knee  2013   History   Social History  . Marital Status: Divorced    Spouse Name: N/A    Number of Children: N/A  . Years of Education: N/A   Social History Main Topics  . Smoking status: Current Every Day Smoker  . Smokeless tobacco: None  . Alcohol Use: No  . Drug Use: No  . Sexual Activity: Yes   Other Topics Concern  . None   Social History Narrative   History reviewed. No pertinent family history. No Known Allergies Prior to Admission medications   Medication Sig Start Date End Date Taking? Authorizing Provider  albuterol (PROVENTIL HFA;VENTOLIN HFA) 108 (90 BASE) MCG/ACT inhaler Inhale 1-2 puffs into the lungs every 6 (six) hours as needed for wheezing or shortness of breath. 07/16/14  Yes Leandrew Koyanagi, MD  atomoxetine (STRATTERA) 100 MG capsule TAKE ONE CAPSULE EVERY DAY 07/16/14  Yes Leandrew Koyanagi, MD  clobetasol cream (TEMOVATE) 0.05 % Apply topically 2 (two) times daily. 07/21/14  Yes Chelle S Jeffery, PA-C  FLUoxetine (PROZAC) 40 MG capsule TAKE ONE CAPSULE EVERY DAY 03/08/14  Yes Robyn Haber, MD  FLUoxetine (PROZAC) 40 MG  capsule Take 1 capsule (40 mg total) by mouth daily. 07/16/14  Yes Leandrew Koyanagi, MD  fluticasone (FLONASE) 50 MCG/ACT nasal spray Place 2 sprays into both nostrils daily. 07/16/14  Yes Leandrew Koyanagi, MD  losartan-hydrochlorothiazide (HYZAAR) 100-12.5 MG per tablet Take 1 tablet by mouth daily. 07/16/14  Yes Leandrew Koyanagi, MD  meclizine (ANTIVERT) 25 MG tablet Take 0.5-1 tablets (12.5-25 mg total) by mouth 3 (three) times daily as needed for dizziness or nausea. 07/16/14  Yes Leandrew Koyanagi, MD  meloxicam (MOBIC) 15 MG tablet TAKE 1 TABLET EVERY DAY 07/16/14  Yes Leandrew Koyanagi, MD  traZODone (DESYREL) 50 MG tablet TAKE 1 TABLET AT BEDTIME 07/16/14  Yes Leandrew Koyanagi, MD  cyclobenzaprine (FLEXERIL) 10 MG tablet Take 1 tablet (10 mg total) by mouth 3 (three) times daily as needed for muscle spasms. Especially at bedtime Patient not taking: Reported on 09/22/2014 08/05/14   Kathlen Brunswick, PA-C  predniSONE (DELTASONE) 20 MG tablet Take 3 PO QAM x3days, 2 PO QAM x3days, 1 PO QAM x3days Patient not taking: Reported on 09/22/2014 08/05/14   Kathlen Brunswick, PA-C     ROS: The patient denies fevers, chills, night sweats, unintentional weight loss, chest pain,  palpitations, wheezing, dyspnea on exertion, nausea, vomiting, abdominal pain, dysuria, hematuria, melena, numbness, weakness, or tingling.   All other systems have been reviewed and were otherwise negative with the exception of those mentioned in the HPI and as above.    PHYSICAL EXAM: Filed Vitals:   09/22/14 1842  BP: 118/72  Pulse: 87  Temp: 98.3 F (36.8 C)  Resp: 16   Filed Vitals:   09/22/14 1842  Height: 5\' 1"  (1.549 m)  Weight: 135 lb 3.2 oz (61.326 kg)   Body mass index is 25.56 kg/(m^2).  General: Alert, no acute distress HEENT:  Normocephalic, atraumatic, oropharynx patent. EOMI, PERRLA Cardiovascular:  Regular rate and rhythm, no rubs murmurs or gallops.  No Carotid bruits, radial pulse  intact. No pedal edema.  Respiratory: Clear to auscultation bilaterally.  No wheezes, rales, or rhonchi.  No cyanosis, no use of accessory musculature GI: No organomegaly, abdomen is soft and non-tender, positive bowel sounds.  No masses. Skin:+ wound 4th toe right foot, + erythema of forefoot Neurologic: Facial musculature symmetric. Psychiatric: Patient is appropriate throughout our interaction. Lymphatic: No cervical lymphadenopathy Musculoskeletal: Gait intact. Good fott cap refill, good DP + wound + erythema of skin up to mid foot 2/2 DTRS ankle, full ROM, 5/5 strength  LABS: Results for orders placed or performed in visit on 09/22/14  POCT CBC  Result Value Ref Range   WBC 8.5 4.6 - 10.2 K/uL   Lymph, poc 3.1 0.6 - 3.4   POC LYMPH PERCENT 35.9 10 - 50 %L   MID (cbc) 0.8 0 - 0.9   POC MID % 8.9 0 - 12 %M   POC Granulocyte 4.7 2 - 6.9   Granulocyte percent 55.2 37 - 80 %G   RBC 4.21 4.04 - 5.48 M/uL   Hemoglobin 13.1 12.2 - 16.2 g/dL   HCT, POC 41.1 37.7 - 47.9 %   MCV 97.7 (A) 80 - 97 fL   MCH, POC 31.2 27 - 31.2 pg   MCHC 31.9 31.8 - 35.4 g/dL   RDW, POC 14.6 %   Platelet Count, POC 353 142 - 424 K/uL   MPV 6.9 0 - 99.8 fL  POCT glycosylated hemoglobin (Hb A1C)  Result Value Ref Range   Hemoglobin A1C 5.1      EKG/XRAY:   Primary read interpreted by Dr. Marin Comment at [facility]. Neg for fx or dislocation, no osteomyelitis   ASSESSMENT/PLAN: Encounter Diagnoses  Name Primary?  . Cellulitis of toe of left foot Yes  . Wound, open, foot with complication, left, initial encounter    RX bactroban and also doxycyline Wound care as directed, dry nonadhesive dressing F/u prn   Gross sideeffects, risk and benefits, and alternatives of medications d/w patient. Patient is aware that all medications have potential sideeffects and we are unable to predict every sideeffect or drug-drug interaction that may occur.  Kartier Bennison, [place], DO 09/22/2014 8:04 PM

## 2014-09-23 LAB — COMPREHENSIVE METABOLIC PANEL
AST: 30 U/L (ref 0–37)
Albumin: 4 g/dL (ref 3.5–5.2)
Alkaline Phosphatase: 109 U/L (ref 39–117)
BUN: 26 mg/dL — ABNORMAL HIGH (ref 6–23)
CO2: 27 mEq/L (ref 19–32)
Creat: 1.08 mg/dL (ref 0.50–1.10)
Potassium: 4 mEq/L (ref 3.5–5.3)
Sodium: 137 mEq/L (ref 135–145)
Total Bilirubin: 0.4 mg/dL (ref 0.2–1.2)
Total Protein: 7 g/dL (ref 6.0–8.3)

## 2014-09-23 LAB — COMPREHENSIVE METABOLIC PANEL WITH GFR
ALT: 33 U/L (ref 0–35)
Calcium: 9.6 mg/dL (ref 8.4–10.5)
Chloride: 102 meq/L (ref 96–112)
Glucose, Bld: 95 mg/dL (ref 70–99)

## 2014-10-14 ENCOUNTER — Ambulatory Visit (INDEPENDENT_AMBULATORY_CARE_PROVIDER_SITE_OTHER): Payer: BC Managed Care – PPO | Admitting: Family Medicine

## 2014-10-14 VITALS — BP 100/68 | HR 89 | Temp 97.4°F | Resp 16 | Ht 61.0 in | Wt 133.0 lb

## 2014-10-14 DIAGNOSIS — Z720 Tobacco use: Secondary | ICD-10-CM

## 2014-10-14 DIAGNOSIS — L97522 Non-pressure chronic ulcer of other part of left foot with fat layer exposed: Secondary | ICD-10-CM

## 2014-10-14 DIAGNOSIS — M5442 Lumbago with sciatica, left side: Secondary | ICD-10-CM

## 2014-10-14 DIAGNOSIS — F17200 Nicotine dependence, unspecified, uncomplicated: Secondary | ICD-10-CM

## 2014-10-14 DIAGNOSIS — T8130XD Disruption of wound, unspecified, subsequent encounter: Secondary | ICD-10-CM

## 2014-10-14 MED ORDER — HYDROCODONE-ACETAMINOPHEN 5-325 MG PO TABS: 1.0000 | ORAL_TABLET | Freq: Three times a day (TID) | ORAL | Status: DC | PRN

## 2014-10-14 MED ORDER — CYCLOBENZAPRINE HCL 10 MG PO TABS: 10.0000 mg | ORAL_TABLET | Freq: Every evening | ORAL | Status: DC | PRN

## 2014-10-14 NOTE — Progress Notes (Signed)
Chief Complaint:  Chief Complaint  Patient presents with  . Follow-up    Left 3rd toe not better/ feels worse  . Dry cracked hands    Not healing    HPI: Sheryl [name] is a 64 y.o. female who is here for  Left 3rd toe pain since 3 days after pulling off some skin around the ulcer , she is here because she is in moderate to sever pain and cannot sleep. She was trying to clean it and when felt something and pulled a piece of "skin string" from the wound. She started having pain then. She has a corn that  She has also been clipping off on her own, it is on the same toe as her ulcer. She was rpeviously treated with abx and has been using the bactroban.   Denies any fevers chill or worsening toe pain prior to pulling off skin. Toe ulcer was actually healing well before she pulled off the skin Labs and xrays from last visit were reassuring that she did not have osteomyelitis.    Past Medical History  Diagnosis Date  . Allergy   . Arthritis   . Osteoporosis    Past Surgical History  Procedure Laterality Date  . Joint replacement    . Replacement total knee  2013   History   Social History  . Marital Status: Divorced    Spouse Name: N/A  . Number of Children: N/A  . Years of Education: N/A   Social History Main Topics  . Smoking status: Current Every Day Smoker  . Smokeless tobacco: Not on file  . Alcohol Use: No  . Drug Use: No  . Sexual Activity: Yes   Other Topics Concern  . None   Social History Narrative   No family history on file. No Known Allergies Prior to Admission medications   Medication Sig Start Date End Date Taking? Authorizing Provider  albuterol (PROVENTIL HFA;VENTOLIN HFA) 108 (90 BASE) MCG/ACT inhaler Inhale 1-2 puffs into the lungs every 6 (six) hours as needed for wheezing or shortness of breath. 07/16/14  Yes Leandrew Koyanagi, MD  atomoxetine (STRATTERA) 100 MG capsule TAKE ONE CAPSULE EVERY DAY 07/16/14  Yes Leandrew Koyanagi, MD    clobetasol cream (TEMOVATE) 0.05 % Apply topically 2 (two) times daily. 07/21/14  Yes Chelle S Jeffery, PA-C  doxycycline (VIBRA-TABS) 100 MG tablet Take 1 tablet (100 mg total) by mouth 2 (two) times daily. 09/22/14  Yes Elaura Calix P Shannel Zahm, DO  FLUoxetine (PROZAC) 40 MG capsule TAKE ONE CAPSULE EVERY DAY 03/08/14  Yes Robyn Haber, MD  FLUoxetine (PROZAC) 40 MG capsule Take 1 capsule (40 mg total) by mouth daily. 07/16/14  Yes Leandrew Koyanagi, MD  fluticasone (FLONASE) 50 MCG/ACT nasal spray Place 2 sprays into both nostrils daily. 07/16/14  Yes Leandrew Koyanagi, MD  losartan-hydrochlorothiazide (HYZAAR) 100-12.5 MG per tablet Take 1 tablet by mouth daily. 07/16/14  Yes Leandrew Koyanagi, MD  meclizine (ANTIVERT) 25 MG tablet Take 0.5-1 tablets (12.5-25 mg total) by mouth 3 (three) times daily as needed for dizziness or nausea. 07/16/14  Yes Leandrew Koyanagi, MD  meloxicam (MOBIC) 15 MG tablet TAKE 1 TABLET EVERY DAY 07/16/14  Yes Leandrew Koyanagi, MD  mupirocin ointment [facility]) 2 % Use as directed twice daily 09/22/14  Yes Timiya Howells P Alysson Geist, DO  traZODone (DESYREL) 50 MG tablet TAKE 1 TABLET AT BEDTIME 07/16/14  Yes Leandrew Koyanagi, MD  cyclobenzaprine (FLEXERIL) 10 MG  tablet Take 1 tablet (10 mg total) by mouth at bedtime as needed for muscle spasms. Especially at bedtime 10/14/14   Kelena Garrow P Troyce Gieske, DO  HYDROcodone-acetaminophen (NORCO) 5-325 MG per tablet Take 1 tablet by mouth every 8 (eight) hours as needed for moderate pain. 10/14/14   Hendrick Pavich P Hani Patnode, DO     ROS: The patient denies fevers, chills, night sweats, unintentional weight loss, chest pain, palpitations, wheezing, dyspnea on exertion, nausea, vomiting, abdominal pain, dysuria, hematuria, melena, numbness, weakness, or tingling.  All other systems have been reviewed and were otherwise negative with the exception of those mentioned in the HPI and as above.    PHYSICAL EXAM: Filed Vitals:   10/14/14 1128  BP: 100/68  Pulse: 89  Temp: 97.4 F  (36.3 C)  Resp: 16   Filed Vitals:   10/14/14 1128  Height: 5\' 1"  (1.549 m)  Weight: 133 lb (60.328 kg)   Body mass index is 25.14 kg/(m^2).  General: Alert, no acute distress HEENT:  Normocephalic, atraumatic, oropharynx patent. EOMI, PERRLA Cardiovascular:  Regular rate and rhythm, no rubs murmurs or gallops.  Radial pulse intact. No pedal edema.  Respiratory: Clear to auscultation bilaterally.  No wheezes, rales, or rhonchi.  No cyanosis, no use of accessory musculature GI: No organomegaly, abdomen is soft and non-tender, positive bowel sounds.  No masses. Skin: + 1.5 by 1 cm ulcer on dorsum of left 3rd toe, healing well, she has some yellow slough/necrosis about 15% but no e/o infection. Looks like she pulled the hyperkeratotic  Tissue and skin off the wound. There is a small corn in the posterior aspect of the toe. Good granulation tissue underneath slough Neurologic: Facial musculature symmetric. Psychiatric: Patient is appropriate throughout our interaction. Lymphatic: No cervical lymphadenopathy Musculoskeletal: Gait intact. + good DP, good cap refill  LABS: Results for orders placed or performed in visit on 09/22/14  Comprehensive metabolic panel  Result Value Ref Range   Sodium 137 135 - 145 mEq/L   Potassium 4.0 3.5 - 5.3 mEq/L   Chloride 102 96 - 112 mEq/L   CO2 27 19 - 32 mEq/L   Glucose, Bld 95 70 - 99 mg/dL   BUN 26 (H) 6 - 23 mg/dL   Creat 1.08 0.50 - 1.10 mg/dL   Total Bilirubin 0.4 0.2 - 1.2 mg/dL   Alkaline Phosphatase 109 39 - 117 U/L   AST 30 0 - 37 U/L   ALT 33 0 - 35 U/L   Total Protein 7.0 6.0 - 8.3 g/dL   Albumin 4.0 3.5 - 5.2 g/dL   Calcium 9.6 8.4 - 10.5 mg/dL  POCT CBC  Result Value Ref Range   WBC 8.5 4.6 - 10.2 K/uL   Lymph, poc 3.1 0.6 - 3.4   POC LYMPH PERCENT 35.9 10 - 50 %L   MID (cbc) 0.8 0 - 0.9   POC MID % 8.9 0 - 12 %M   POC Granulocyte 4.7 2 - 6.9   Granulocyte percent 55.2 37 - 80 %G   RBC 4.21 4.04 - 5.48 M/uL   Hemoglobin  13.1 12.2 - 16.2 g/dL   HCT, POC 41.1 37.7 - 47.9 %   MCV 97.7 (A) 80 - 97 fL   MCH, POC 31.2 27 - 31.2 pg   MCHC 31.9 31.8 - 35.4 g/dL   RDW, POC 14.6 %   Platelet Count, POC 353 142 - 424 K/uL   MPV 6.9 0 - 99.8 fL  POCT glycosylated hemoglobin (Hb A1C)  Result Value Ref Range   Hemoglobin A1C 5.1      EKG/XRAY:   Primary read interpreted by Dr. Marin Comment at [facility].   ASSESSMENT/PLAN: Encounter Diagnoses  Name Primary?  . Toe ulcer, left, with fat layer exposed Yes  . Disruption of wound, subsequent encounter   . Left-sided low back pain with left-sided sciatica   . Tobacco use disorder    64 y/o female with PMH of osteoporosis and also left 3rd toe wound/ulcer  x 3 weeks, here for woresning pain of toe after she peeled off some loose hypertrophic skin along borders of wound.  She was told to stop bactroban Her wound was cleaned and was debrided with a small currette Thin layer of silvidene was applied and dry nonadhesive dressing given Corn was thinned down and trimmed  with 11 scalpel. Wound care as directed Rx norco for pain prn Fu if worsenign sxs.  D/w patient poor wound healing due to tobacco abuse but she is not ready to quit.  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 Patient is aware that all medications have potential sideeffects and we are unable to predict every sideeffect or drug-drug interaction that may occur.  Bryen Hinderman, [place], DO 10/15/2014 1:42 PM

## 2014-10-18 ENCOUNTER — Telehealth: Payer: Self-pay

## 2014-10-18 NOTE — Telephone Encounter (Signed)
Pt dropped off handicap form to be completed   Best phone [phone]

## 2014-10-19 ENCOUNTER — Encounter: Payer: Self-pay | Admitting: Internal Medicine

## 2014-10-19 NOTE — Telephone Encounter (Signed)
Form placed in Dr. Ninfa Meeker box.

## 2014-10-31 ENCOUNTER — Other Ambulatory Visit: Payer: Self-pay | Admitting: Family Medicine

## 2014-11-02 ENCOUNTER — Ambulatory Visit (INDEPENDENT_AMBULATORY_CARE_PROVIDER_SITE_OTHER): Payer: BC Managed Care – PPO

## 2014-11-02 ENCOUNTER — Ambulatory Visit (INDEPENDENT_AMBULATORY_CARE_PROVIDER_SITE_OTHER): Payer: BC Managed Care – PPO | Admitting: Physician Assistant

## 2014-11-02 VITALS — BP 124/76 | HR 101 | Temp 97.7°F | Resp 20 | Ht 60.5 in | Wt 137.0 lb

## 2014-11-02 DIAGNOSIS — R0981 Nasal congestion: Secondary | ICD-10-CM

## 2014-11-02 DIAGNOSIS — J441 Chronic obstructive pulmonary disease with (acute) exacerbation: Secondary | ICD-10-CM | POA: Diagnosis not present

## 2014-11-02 DIAGNOSIS — Z720 Tobacco use: Secondary | ICD-10-CM | POA: Diagnosis not present

## 2014-11-02 DIAGNOSIS — L97529 Non-pressure chronic ulcer of other part of left foot with unspecified severity: Secondary | ICD-10-CM

## 2014-11-02 DIAGNOSIS — D649 Anemia, unspecified: Secondary | ICD-10-CM

## 2014-11-02 DIAGNOSIS — R05 Cough: Secondary | ICD-10-CM

## 2014-11-02 DIAGNOSIS — R058 Other specified cough: Secondary | ICD-10-CM

## 2014-11-02 DIAGNOSIS — R42 Dizziness and giddiness: Secondary | ICD-10-CM

## 2014-11-02 LAB — POCT CBC
Granulocyte percent: 71.6 %G (ref 37–80)
HEMATOCRIT: 36 % — AB (ref 37.7–47.9)
Hemoglobin: 11.2 g/dL — AB (ref 12.2–16.2)
Lymph, poc: 2.5 (ref 0.6–3.4)
MCH, POC: 30.3 pg (ref 27–31.2)
MCHC: 31.1 g/dL — AB (ref 31.8–35.4)
MCV: 97.3 fL — AB (ref 80–97)
MID (cbc): 1.2 — AB (ref 0–0.9)
MPV: 6.5 fL (ref 0–99.8)
PLATELET COUNT, POC: 366 10*3/uL (ref 142–424)
POC GRANULOCYTE: 9.2 — AB (ref 2–6.9)
POC LYMPH PERCENT: 19.4 %L (ref 10–50)
POC MID %: 9 %M (ref 0–12)
RBC: 3.69 M/uL — AB (ref 4.04–5.48)
RDW, POC: 14.2 %
WBC: 12.8 10*3/uL — AB (ref 4.6–10.2)

## 2014-11-02 LAB — GLUCOSE, POCT (MANUAL RESULT ENTRY): POC Glucose: 92 mg/dl (ref 70–99)

## 2014-11-02 MED ORDER — ALBUTEROL SULFATE (2.5 MG/3ML) 0.083% IN NEBU
2.5000 mg | INHALATION_SOLUTION | Freq: Once | RESPIRATORY_TRACT | Status: AC
Start: 2014-11-02 — End: 2014-11-02
  Administered 2014-11-02: 2.5 mg via RESPIRATORY_TRACT

## 2014-11-02 MED ORDER — NICOTINE 7 MG/24HR TD PT24: 7.0000 mg | MEDICATED_PATCH | Freq: Every day | TRANSDERMAL | Status: DC

## 2014-11-02 MED ORDER — NICOTINE 14 MG/24HR TD PT24: 14.0000 mg | MEDICATED_PATCH | Freq: Every day | TRANSDERMAL | Status: DC

## 2014-11-02 MED ORDER — DOXYCYCLINE HYCLATE 100 MG PO CAPS: 100.0000 mg | ORAL_CAPSULE | Freq: Two times a day (BID) | ORAL | Status: AC

## 2014-11-02 MED ORDER — PREDNISONE 20 MG PO TABS: ORAL_TABLET | ORAL | Status: DC

## 2014-11-02 MED ORDER — IPRATROPIUM BROMIDE 0.02 % IN SOLN
0.5000 mg | Freq: Once | RESPIRATORY_TRACT | Status: AC
  Administered 2014-11-02: 0.5 mg via RESPIRATORY_TRACT

## 2014-11-02 MED ORDER — IPRATROPIUM BROMIDE 0.03 % NA SOLN: 2.0000 | Freq: Two times a day (BID) | NASAL | Status: DC

## 2014-11-02 MED ORDER — HYDROCOD POLST-CHLORPHEN POLST 10-8 MG/5ML PO LQCR
5.0000 mL | Freq: Two times a day (BID) | ORAL | Status: DC | PRN
Start: 2014-11-02 — End: 2014-11-16

## 2014-11-02 NOTE — Progress Notes (Addendum)
Subjective:    Patient ID: Sheryl [name], female    DOB: [birth year], 64 y.o.   MRN: [id number]  HPI  This is a 64 year old female with PMH tobacco abuse who is presenting with 7 days of cough and sore throat. Cough is productive of green mucous. She has increased SOB. States right now walking 30 feet would cause SOB - prior she could walk a mile without getting SOB. States she has a cough at baseline that mostly bothers her in the morning. She has albuterol at that home that she does not use as baseline. She has been using 1-2 times a day since being sick. she is not on a maintenance inhaler. States she has not gotten good sleep the past 4 nights d/t coughing. States she is coughing "all night nonstop". Has tried delsym and tessalon with some help. She has amoxicillin 500 mg at home that she had left over from a dental procedure. She has been taking it BID x 6 days and not helping. States when she got to clinic today she started to "feel weird" and like "she was floating". She has a history of vertigo and she states this is what it feels like for her. She has had 8 oz water since that symptom started and she is feeling better. She denies lightheadedness, palpitations or CP. Pt has cut down from 1 ppd of cigarettes to 0.5 ppd over the past 1 year. She desires to quit completely. She does not like the diagnosis of COPD and the amount of coughing over the past 4 days has scared her. She is going to try to quit with nicoderm patches - she has on one right now.  Pt has had 2 months of left 3rd toe ulcer. It was slow to heal but has started to scab. She is no longer having pain.  Review of Systems  Constitutional: Negative for fever and chills.  HENT: Positive for congestion and sore throat. Negative for ear pain and sinus pressure.   Eyes: Negative for redness.  Respiratory: Positive for cough and shortness of breath. Negative for wheezing.   Cardiovascular: Negative for chest pain and palpitations.    Gastrointestinal: Negative for nausea, vomiting, abdominal pain and diarrhea.  Skin: Negative for rash.  Allergic/Immunologic: Positive for environmental allergies.  Neurological: Positive for dizziness. Negative for light-headedness.  Hematological: Negative for adenopathy.  Psychiatric/Behavioral: Positive for sleep disturbance and agitation.   Patient Active Problem List   Diagnosis Date Noted  . COPD exacerbation 11/02/2014  . Lumbago 07/16/2014  . Bilateral hand pain 07/16/2014  . Nicotine addiction 06/06/2012  . HTN (hypertension) 06/04/2012  . Hyperlipidemia 06/04/2012  . Depression 06/04/2012  . ADD (attention deficit disorder) 06/04/2012  . AR (allergic rhinitis) 06/04/2012   Prior to Admission medications   Medication Sig Start Date End Date Taking? Authorizing Provider  albuterol (PROVENTIL HFA;VENTOLIN HFA) 108 (90 BASE) MCG/ACT inhaler Inhale 1-2 puffs into the lungs every 6 (six) hours as needed for wheezing or shortness of breath. 07/16/14  Yes Leandrew Koyanagi, MD  atomoxetine (STRATTERA) 100 MG capsule TAKE ONE CAPSULE EVERY DAY 07/16/14  Yes Leandrew Koyanagi, MD  clobetasol cream (TEMOVATE) 0.05 % Apply topically 2 (two) times daily. 07/21/14  Yes Chelle S Jeffery, PA-C  cyclobenzaprine (FLEXERIL) 10 MG tablet Take 1 tablet (10 mg total) by mouth at bedtime as needed for muscle spasms. Especially at bedtime 10/14/14  Yes Thao P Le, DO  FLUoxetine (PROZAC) 40 MG capsule Take 1  capsule (40 mg total) by mouth daily. 07/16/14  Yes Leandrew Koyanagi, MD  fluticasone (FLONASE) 50 MCG/ACT nasal spray Place 2 sprays into both nostrils daily. 07/16/14  Yes Leandrew Koyanagi, MD  losartan-hydrochlorothiazide (HYZAAR) 100-12.5 MG per tablet Take 1 tablet by mouth daily. 07/16/14  Yes Leandrew Koyanagi, MD  meclizine (ANTIVERT) 25 MG tablet Take 0.5-1 tablets (12.5-25 mg total) by mouth 3 (three) times daily as needed for dizziness or nausea. 07/16/14  Yes Leandrew Koyanagi,  MD  meloxicam (MOBIC) 15 MG tablet TAKE 1 TABLET EVERY DAY 07/16/14  Yes Leandrew Koyanagi, MD  traZODone (DESYREL) 50 MG tablet TAKE 1 TABLET AT BEDTIME 07/16/14  Yes Leandrew Koyanagi, MD                                                    No Known Allergies  Patient's social and family history were reviewed.     Objective:   Physical Exam  Constitutional: She is oriented to person, place, and time. She appears well-developed and well-nourished. No distress.  HENT:  Head: Normocephalic and atraumatic.  Right Ear: Hearing, external ear and ear canal normal. Tympanic membrane is erythematous. Tympanic membrane is not retracted and not bulging.  Left Ear: Hearing and external ear normal. Tympanic membrane is erythematous. Tympanic membrane is not retracted and not bulging.  Nose: Nose normal. Right sinus exhibits no maxillary sinus tenderness and no frontal sinus tenderness. Left sinus exhibits no maxillary sinus tenderness and no frontal sinus tenderness.  Mouth/Throat: Uvula is midline and mucous membranes are normal. Posterior oropharyngeal erythema present. No oropharyngeal exudate or posterior oropharyngeal edema.  Eyes: Conjunctivae and lids are normal. Right eye exhibits no discharge. Left eye exhibits no discharge. No scleral icterus.  Cardiovascular: Normal rate, regular rhythm, normal heart sounds, intact distal pulses and normal pulses.   No murmur heard. Pulmonary/Chest: Effort normal. No respiratory distress.  Scattered wheezes and rhonchi throughout. No rales. Breath sounds and symptoms improved after duoneb treatment  Musculoskeletal: Normal range of motion.  Lymphadenopathy:       Head (right side): No submental, no submandibular and no tonsillar adenopathy present.       Head (left side): No submental, no submandibular and no tonsillar adenopathy present.    She has no cervical adenopathy.  Neurological: She is alert and oriented to person, place, and time.  Skin:  Skin is warm, dry and intact. No lesion and no rash noted.  Psychiatric: She has a normal mood and affect. Her speech is normal and behavior is normal. Thought content normal.  BP 124/76 mmHg  Pulse 101  Temp(Src) 97.7 F (36.5 C) (Oral)  Resp 20  Ht 5' 0.5" (1.537 m)  Wt 137 lb (62.143 kg)  BMI 26.31 kg/m2  SpO2 96%  [facility] reading (PRIMARY) by  Dr. Brigitte Pulse: blunting of left costovertebral angle and right lung base. Coarsening of interstitium.   EKG: NSR, prominent R in V1, suggestive of pulmonary disease  Results for orders placed or performed in visit on 11/02/14  POCT CBC  Result Value Ref Range   WBC 12.8 (A) 4.6 - 10.2 K/uL   Lymph, poc 2.5 0.6 - 3.4   POC LYMPH PERCENT 19.4 10 - 50 %L   MID (cbc) 1.2 (A) 0 - 0.9   POC MID % 9.0 0 -  12 %M   POC Granulocyte 9.2 (A) 2 - 6.9   Granulocyte percent 71.6 37 - 80 %G   RBC 3.69 (A) 4.04 - 5.48 M/uL   Hemoglobin 11.2 (A) 12.2 - 16.2 g/dL   HCT, POC 36.0 (A) 37.7 - 47.9 %   MCV 97.3 (A) 80 - 97 fL   MCH, POC 30.3 27 - 31.2 pg   MCHC 31.1 (A) 31.8 - 35.4 g/dL   RDW, POC 14.2 %   Platelet Count, POC 366 142 - 424 K/uL   MPV 6.5 0 - 99.8 fL  POCT glucose (manual entry)  Result Value Ref Range   POC Glucose 92 70 - 99 mg/dl       Assessment & Plan:  1. Dizziness 2. COPD exacerbation 3. Nasal congestion 4. Tobacco abuse 5. Toe ulcer WBC slightly elevated with left shift. Pt appears well in office today. Feeling of "floating outside body" resolved with 8 oz water. Will treat COPD exacerbation with prednisone and doxy. tussionex and atrovent for symptom control. Discussed at length the need for her to quit smoking. She very much wants to quit. She will taper down on nicoderm patches - 14mg /day for 1-2 weeks, then 7 mg/day for 1-2 weeks then stop. She will return in 1-2 weeks to see me or Dr. Laney Pastor to follow up on smoking cessation. Follow up on anemia at that time. Toe ulcer is healing very nicely. She understands that prior poor  healing was likely d/t tobacco use.  - POCT glucose (manual entry) - EKG 12-Lead - DG Chest 2 View; Future - POCT CBC - albuterol (PROVENTIL) (2.5 MG/3ML) 0.083% nebulizer solution 2.5 mg; Take 3 mLs (2.5 mg total) by nebulization once. - ipratropium (ATROVENT) nebulizer solution 0.5 mg; Take 2.5 mLs (0.5 mg total) by nebulization once. - doxycycline (VIBRAMYCIN) 100 MG capsule; Take 1 capsule (100 mg total) by mouth 2 (two) times daily.  Dispense: 14 capsule; Refill: 0 - chlorpheniramine-HYDROcodone (TUSSIONEX PENNKINETIC ER) 10-8 MG/5ML LQCR; Take 5 mLs by mouth every 12 (twelve) hours as needed for cough (cough).  Dispense: 80 mL; Refill: 0 - predniSONE (DELTASONE) 20 MG tablet; Take 2 tabs (40 mg) po x 5 days then stop  Dispense: 10 tablet; Refill: 0 - ipratropium (ATROVENT) 0.03 % nasal spray; Place 2 sprays into both nostrils 2 (two) times daily.  Dispense: 30 mL; Refill: 0 - nicotine (NICODERM CQ - DOSED IN MG/24 HOURS) 14 mg/24hr patch; Place 1 patch (14 mg total) onto the skin daily.  Dispense: 28 patch; Refill: 0 - nicotine (NICODERM CQ - DOSED IN MG/24 HR) 7 mg/24hr patch; Place 1 patch (7 mg total) onto the skin daily.  Dispense: 28 patch; Refill: 0   Benjaman Pott. Drenda Freeze, MHS [facility]  11/02/2014

## 2014-11-02 NOTE — Patient Instructions (Signed)
Take antibiotic until finished. Take the cough syrup at night for sleep. Use the nasal spray twice a day. Take prednisone until finished. Drink plenty of water and get plenty of rest. Take meclizine for dizziness.  Chronic Obstructive Pulmonary Disease Chronic obstructive pulmonary disease (COPD) is a common lung condition in which airflow from the lungs is limited. COPD is a general term that can be used to describe many different lung problems that limit airflow, including both chronic bronchitis and emphysema. If you have COPD, your lung function will probably never return to normal, but there are measures you can take to improve lung function and make yourself feel better.  CAUSES   Smoking (common).   Exposure to secondhand smoke.   Genetic problems.  Chronic inflammatory lung diseases or recurrent infections. SYMPTOMS   Shortness of breath, especially with physical activity.   Deep, persistent (chronic) cough with a large amount of thick mucus.   Wheezing.   Rapid breaths (tachypnea).   Gray or bluish discoloration (cyanosis) of the skin, especially in fingers, toes, or lips.   Fatigue.   Weight loss.   Frequent infections or episodes when breathing symptoms become much worse (exacerbations).   Chest tightness. DIAGNOSIS  Your health care provider will take a medical history and perform a physical examination to make the initial diagnosis. Additional tests for COPD may include:   Lung (pulmonary) function tests.  Chest X-ray.  CT scan.  Blood tests. TREATMENT  Treatment available to help you feel better when you have COPD includes:   Inhaler and nebulizer medicines. These help manage the symptoms of COPD and make your breathing more comfortable.  Supplemental oxygen. Supplemental oxygen is only helpful if you have a low oxygen level in your blood.   Exercise and physical activity. These are beneficial for nearly all people with COPD. Some  people may also benefit from a pulmonary rehabilitation program. HOME CARE INSTRUCTIONS   Take all medicines (inhaled or pills) as directed by your health care provider.  Avoid over-the-counter medicines or cough syrups that dry up your airway (such as antihistamines) and slow down the elimination of secretions unless instructed otherwise by your health care provider.   If you are a smoker, the most important thing that you can do is stop smoking. Continuing to smoke will cause further lung damage and breathing trouble. Ask your health care provider for help with quitting smoking. He or she can direct you to community resources or hospitals that provide support.  Avoid exposure to irritants such as smoke, chemicals, and fumes that aggravate your breathing.  Use oxygen therapy and pulmonary rehabilitation if directed by your health care provider. If you require home oxygen therapy, ask your health care provider whether you should purchase a pulse oximeter to measure your oxygen level at home.   Avoid contact with individuals who have a contagious illness.  Avoid extreme temperature and humidity changes.  Eat healthy foods. Eating smaller, more frequent meals and resting before meals may help you maintain your strength.  Stay active, but balance activity with periods of rest. Exercise and physical activity will help you maintain your ability to do things you want to do.  Preventing infection and hospitalization is very important when you have COPD. Make sure to receive all the vaccines your health care provider recommends, especially the pneumococcal and influenza vaccines. Ask your health care provider whether you need a pneumonia vaccine.  Learn and use relaxation techniques to manage stress.  Learn and use controlled  breathing techniques as directed by your health care provider. Controlled breathing techniques include:   Pursed lip breathing. Start by breathing in (inhaling) through  your nose for 1 second. Then, purse your lips as if you were going to whistle and breathe out (exhale) through the pursed lips for 2 seconds.   Diaphragmatic breathing. Start by putting one hand on your abdomen just above your waist. Inhale slowly through your nose. The hand on your abdomen should move out. Then purse your lips and exhale slowly. You should be able to feel the hand on your abdomen moving in as you exhale.   Learn and use controlled coughing to clear mucus from your lungs. Controlled coughing is a series of short, progressive coughs. The steps of controlled coughing are:  1. Lean your head slightly forward.  2. Breathe in deeply using diaphragmatic breathing.  3. Try to hold your breath for 3 seconds.  4. Keep your mouth slightly open while coughing twice.  5. Spit any mucus out into a tissue.  6. Rest and repeat the steps once or twice as needed. SEEK MEDICAL CARE IF:   You are coughing up more mucus than usual.   There is a change in the color or thickness of your mucus.   Your breathing is more labored than usual.   Your breathing is faster than usual.  SEEK IMMEDIATE MEDICAL CARE IF:   You have shortness of breath while you are resting.   You have shortness of breath that prevents you from:  Being able to talk.   Performing your usual physical activities.   You have chest pain lasting longer than 5 minutes.   Your skin color is more cyanotic than usual.  You measure low oxygen saturations for longer than 5 minutes with a pulse oximeter. MAKE SURE YOU:   Understand these instructions.  Will watch your condition.  Will get help right away if you are not doing well or get worse. Document Released: 05/16/2005 Document Revised: 12/21/2013 Document Reviewed: 04/02/2013 [employer] Patient Information 2015 [place], [place]. This information is not intended to replace advice given to you by your health care provider. Make sure you discuss any  questions you have with your health care provider.

## 2014-11-16 ENCOUNTER — Ambulatory Visit (INDEPENDENT_AMBULATORY_CARE_PROVIDER_SITE_OTHER): Payer: BC Managed Care – PPO | Admitting: Physician Assistant

## 2014-11-16 VITALS — BP 132/78 | HR 103 | Temp 97.8°F | Resp 17 | Ht 60.25 in | Wt 136.2 lb

## 2014-11-16 DIAGNOSIS — J449 Chronic obstructive pulmonary disease, unspecified: Secondary | ICD-10-CM

## 2014-11-16 DIAGNOSIS — D649 Anemia, unspecified: Secondary | ICD-10-CM

## 2014-11-16 DIAGNOSIS — J441 Chronic obstructive pulmonary disease with (acute) exacerbation: Secondary | ICD-10-CM | POA: Diagnosis not present

## 2014-11-16 DIAGNOSIS — R062 Wheezing: Secondary | ICD-10-CM | POA: Diagnosis not present

## 2014-11-16 DIAGNOSIS — Z09 Encounter for follow-up examination after completed treatment for conditions other than malignant neoplasm: Secondary | ICD-10-CM

## 2014-11-16 LAB — POCT CBC
GRANULOCYTE PERCENT: 57.2 % (ref 37–80)
HEMATOCRIT: 34.9 % — AB (ref 37.7–47.9)
HEMOGLOBIN: 11.4 g/dL — AB (ref 12.2–16.2)
Lymph, poc: 2.3 (ref 0.6–3.4)
MCH, POC: 31.2 pg (ref 27–31.2)
MCHC: 32.5 g/dL (ref 31.8–35.4)
MCV: 96.1 fL (ref 80–97)
MID (CBC): 1.1 — AB (ref 0–0.9)
MPV: 6.6 fL (ref 0–99.8)
PLATELET COUNT, POC: 364 10*3/uL (ref 142–424)
POC Granulocyte: 4.5 (ref 2–6.9)
POC LYMPH %: 29.2 % (ref 10–50)
POC MID %: 13.6 % — AB (ref 0–12)
RBC: 3.64 M/uL — AB (ref 4.04–5.48)
RDW, POC: 13.7 %
WBC: 7.9 10*3/uL (ref 4.6–10.2)

## 2014-11-16 LAB — FOLATE

## 2014-11-16 LAB — IRON AND TIBC
%SAT: 19 % — ABNORMAL LOW (ref 20–55)
IRON: 59 ug/dL (ref 42–145)
TIBC: 313 ug/dL (ref 250–470)
UIBC: 254 ug/dL (ref 125–400)

## 2014-11-16 LAB — FERRITIN: Ferritin: 74 ng/mL (ref 10–291)

## 2014-11-16 LAB — VITAMIN B12: Vitamin B-12: 1176 pg/mL — ABNORMAL HIGH (ref 211–911)

## 2014-11-16 MED ORDER — ALBUTEROL SULFATE HFA 108 (90 BASE) MCG/ACT IN AERS: 1.0000 | INHALATION_SPRAY | Freq: Four times a day (QID) | RESPIRATORY_TRACT | Status: DC | PRN

## 2014-11-16 NOTE — Progress Notes (Signed)
11/16/2014 at 3:30 PM  Sheryl [name] / DOB: 25-Jan-1951 / MRN: [id number]  The patient has HTN (hypertension); Hyperlipidemia; Depression; ADD (attention deficit disorder); AR (allergic rhinitis); Nicotine addiction; Lumbago; Bilateral hand pain; and COPD exacerbation on her problem list.  SUBJECTIVE  Chief complaint: Follow-up and Medication Refill   History of present illness: Sheryl [name] is 64 y.o. well appearing female presenting for a follow up of a COPD exacerbation that occurred roughly two weeks ago.  She was also diagnosed with COPD at that time.  She was given a course of Doxy and prednisone.  Roughly two days after starting her medicine her SOB, dizziness, chills, DOE improved and now she is symptoms free.  She has also stopped smoking, and has not needed the patches, but will use them if she gets a particularly difficult craving.  Her third left toe is improving, and says that this started as a blister 2/2 a pair of boots.  She has since stopped wearing these shoes and is now "90%" improved.    She  has a past medical history of Allergy; Arthritis; and Osteoporosis.    She has a current medication list which includes the following prescription(s): albuterol, atomoxetine, clobetasol cream, fluoxetine, fluticasone, ipratropium, losartan-hydrochlorothiazide, meclizine, meloxicam, and trazodone.  Sheryl [name] has No Known Allergies. She  reports that she has been smoking Cigarettes.  She has a 24 pack-year smoking history. She does not have any smokeless tobacco history on file. She reports that she does not drink alcohol or use illicit drugs. She  reports that she currently engages in sexual activity. The patient  has past surgical history that includes Joint replacement and Replacement total knee (2013).  Her family history is not on file.  Review of Systems  Constitutional: Negative for fever and chills.  Respiratory: Negative for cough, shortness of breath and wheezing.     Cardiovascular: Negative for chest pain and palpitations.  Gastrointestinal: Negative.   Genitourinary: Negative.   Musculoskeletal: Negative.   Neurological: Negative for dizziness and headaches.  Psychiatric/Behavioral: Negative for hallucinations.    OBJECTIVE  Her  height is 5' 0.25" (1.53 m) and weight is 136 lb 3.2 oz (61.78 kg). Her oral temperature is 97.8 F (36.6 C). Her blood pressure is 132/78 and her pulse is 103. Her respiration is 17 and oxygen saturation is 99%.  The patient's body mass index is 26.39 kg/(m^2).  Physical Exam  Constitutional: She is oriented to person, place, and time. She appears well-developed and well-nourished. No distress.  Cardiovascular: Normal rate and regular rhythm.   Respiratory: Effort normal and breath sounds normal. No respiratory distress. She has no wheezes. She has no rales. She exhibits no tenderness.  GI: Soft. Bowel sounds are normal.  Musculoskeletal: Normal range of motion.  Neurological: She is alert and oriented to person, place, and time. No cranial nerve deficit.  Skin: Skin is warm and dry. She is not diaphoretic.  Psychiatric: She has a normal mood and affect.    Results for orders placed or performed in visit on 11/16/14 (from the past 24 hour(s))  POCT CBC     Status: Abnormal   Collection Time: 11/16/14  2:46 PM  Result Value Ref Range   WBC 7.9 4.6 - 10.2 K/uL   Lymph, poc 2.3 0.6 - 3.4   POC LYMPH PERCENT 29.2 10 - 50 %L   MID (cbc) 1.1 (A) 0 - 0.9   POC MID % 13.6 (A) 0 - 12 %M   POC  Granulocyte 4.5 2 - 6.9   Granulocyte percent 57.2 37 - 80 %G   RBC 3.64 (A) 4.04 - 5.48 M/uL   Hemoglobin 11.4 (A) 12.2 - 16.2 g/dL   HCT, POC 34.9 (A) 37.7 - 47.9 %   MCV 96.1 80 - 97 fL   MCH, POC 31.2 27 - 31.2 pg   MCHC 32.5 31.8 - 35.4 g/dL   RDW, POC 13.7 %   Platelet Count, POC 364 142 - 424 K/uL   MPV 6.6 0 - 99.8 fL   Office Spirometry Results: FEV1: 1.65 liters FVC: 2.32 liters FEV1/FVC: 71.1 % FVC  %  Predicted: 3.34 liters FEV % Predicted: 2.49 liters FeF 25-75: 1.04 liters FeF 25-75 % Predicted: 2.05   ASSESSMENT & PLAN  Skyelar was seen today for follow-up and medication refill.  Diagnoses and all orders for this visit:  Follow up  COPD exacerbation:  This episode started roughly two weeks ago and has seemed to resolve.  CBC and PE reassuring.  Patient has quit smoking and has a plan per HPI for cravings. Orders: -     POCT CBC  COPD, mild: Prescribed rescue inhaler.   Orders: -     Spirometry with Graph; Standing -     Spirometry with Graph  Anemia, unspecified anemia type: Will devise a plan as directed by labs.   Orders: -     Vitamin B12 -     Folate -     Ferritin -     Iron and TIBC    The patient was advised to call or come back to clinic if she does not see an improvement in symptoms, or worsens with the above plan.   Philis Fendt, MHS, PA-C [facility] 11/16/2014 3:30 PM

## 2014-12-03 ENCOUNTER — Ambulatory Visit (INDEPENDENT_AMBULATORY_CARE_PROVIDER_SITE_OTHER): Payer: BC Managed Care – PPO | Admitting: Internal Medicine

## 2014-12-03 VITALS — BP 118/78 | HR 102 | Temp 97.3°F | Resp 22

## 2014-12-03 DIAGNOSIS — S0190XA Unspecified open wound of unspecified part of head, initial encounter: Secondary | ICD-10-CM | POA: Diagnosis not present

## 2014-12-03 NOTE — Progress Notes (Signed)
Procedure:  Consent obtained.  Local anesthesia with 1% lido with epi - wound was cleaned and hair was debrided out of the wound.  The wound was 4cm long and involved the galea.  The area was draped in a strile fashion. Attempt was made to repair the galea but it was brittle and did not hold the #5 attempts made.  Multiple simple subcutaneous sutures with 7-0 Vicryl placed for wound closure and steri strips were placed for more strength of the wound.  Wound care d/w pt.  How to decrease chances of scarring was discussed with patient.

## 2014-12-03 NOTE — Progress Notes (Signed)
   Subjective:    Patient ID: Sheryl [name], female    DOB: 1951-02-13, 64 y.o.   MRN: [id number]  HPI she was walking her dog who is blind and tripped over him falling and striking the door handle on her right temple. There is no loss of consciousness. This was just a few minutes ago. She has no vision difficulty, headache, nausea, dizziness. There was a lot of bleeding.  Regular patient of ours Patient Active Problem List   Diagnosis Date Noted  . COPD exacerbation 11/02/2014  . Lumbago 07/16/2014  . Bilateral hand pain 07/16/2014  . Nicotine addiction 06/06/2012  . HTN (hypertension) 06/04/2012  . Hyperlipidemia 06/04/2012  . Depression 06/04/2012  . ADD (attention deficit disorder) 06/04/2012  . AR (allergic rhinitis) 06/04/2012   Continues to smoke and unwilling to quit  Prior to Admission medications   Medication Sig Start Date End Date Taking? Authorizing Provider  albuterol (PROVENTIL HFA;VENTOLIN HFA) 108 (90 BASE) MCG/ACT inhaler Inhale 1-2 puffs into the lungs every 6 (six) hours as needed for wheezing or shortness of breath. 11/16/14   Tereasa Coop, PA-C  atomoxetine (STRATTERA) 100 MG capsule TAKE ONE CAPSULE EVERY DAY 07/16/14   Leandrew Koyanagi, MD  clobetasol cream (TEMOVATE) 0.05 % Apply topically 2 (two) times daily. 07/21/14   Chelle S Jeffery, PA-C  FLUoxetine (PROZAC) 40 MG capsule Take 1 capsule (40 mg total) by mouth daily. 07/16/14   Leandrew Koyanagi, MD  fluticasone (FLONASE) 50 MCG/ACT nasal spray Place 2 sprays into both nostrils daily. 07/16/14   Leandrew Koyanagi, MD  ipratropium (ATROVENT) 0.03 % nasal spray Place 2 sprays into both nostrils 2 (two) times daily. 11/02/14   Ezekiel Slocumb, PA-C  losartan-hydrochlorothiazide (HYZAAR) 100-12.5 MG per tablet Take 1 tablet by mouth daily. 07/16/14   Leandrew Koyanagi, MD  meclizine (ANTIVERT) 25 MG tablet Take 0.5-1 tablets (12.5-25 mg total) by mouth 3 (three) times daily as needed for dizziness or  nausea. 07/16/14   Leandrew Koyanagi, MD  meloxicam (MOBIC) 15 MG tablet TAKE 1 TABLET EVERY DAY 07/16/14   Leandrew Koyanagi, MD  traZODone (DESYREL) 50 MG tablet TAKE 1 TABLET AT BEDTIME 07/16/14   Leandrew Koyanagi, MD       Review of Systems Noncontributory    Objective:   Physical Exam BP 118/78 mmHg  Pulse 102  Temp(Src) 97.3 F (36.3 C)  Resp 22  SpO2 99% No acute distress/ alert and oriented/ PERRLA and EOMs conjugate Nares and oropharynx clear Neck supple Heart regular Cranial nerves II through XII intact No sensory or motor losses Gait normal Romberg negative  The wound is a 4-5 cm laceration over the right temple that is diagonal with an open area of the galea about 1.5 cm diameter No skull defect palpable      Assessment & Plan:  Wound, open, head, initial encounter  intact neurological  Procedure by PA-C Weber for wound closure and she will follow-up in one week for wound  recheck if necessary

## 2014-12-09 ENCOUNTER — Ambulatory Visit (INDEPENDENT_AMBULATORY_CARE_PROVIDER_SITE_OTHER): Payer: BC Managed Care – PPO | Admitting: Family Medicine

## 2014-12-09 VITALS — BP 120/68 | HR 96 | Temp 98.1°F | Resp 18 | Ht 60.5 in | Wt 137.0 lb

## 2014-12-09 DIAGNOSIS — R062 Wheezing: Secondary | ICD-10-CM | POA: Diagnosis not present

## 2014-12-09 DIAGNOSIS — R059 Cough, unspecified: Secondary | ICD-10-CM

## 2014-12-09 DIAGNOSIS — R05 Cough: Secondary | ICD-10-CM

## 2014-12-09 DIAGNOSIS — J449 Chronic obstructive pulmonary disease, unspecified: Secondary | ICD-10-CM

## 2014-12-09 DIAGNOSIS — J441 Chronic obstructive pulmonary disease with (acute) exacerbation: Secondary | ICD-10-CM

## 2014-12-09 HISTORY — DX: Chronic obstructive pulmonary disease, unspecified: J44.9

## 2014-12-09 MED ORDER — DOXYCYCLINE HYCLATE 100 MG PO CAPS: 100.0000 mg | ORAL_CAPSULE | Freq: Two times a day (BID) | ORAL | Status: DC

## 2014-12-09 MED ORDER — TIOTROPIUM BROMIDE MONOHYDRATE 18 MCG IN CAPS: 18.0000 ug | ORAL_CAPSULE | Freq: Every day | RESPIRATORY_TRACT | Status: DC

## 2014-12-09 MED ORDER — IPRATROPIUM BROMIDE 0.02 % IN SOLN
0.5000 mg | Freq: Once | RESPIRATORY_TRACT | Status: AC
Start: 2014-12-09 — End: 2014-12-09
  Administered 2014-12-09: 0.5 mg via RESPIRATORY_TRACT

## 2014-12-09 MED ORDER — PREDNISONE 20 MG PO TABS: ORAL_TABLET | ORAL | Status: DC

## 2014-12-09 MED ORDER — ALBUTEROL SULFATE (2.5 MG/3ML) 0.083% IN NEBU
2.5000 mg | INHALATION_SOLUTION | Freq: Once | RESPIRATORY_TRACT | Status: AC
  Administered 2014-12-09: 2.5 mg via RESPIRATORY_TRACT

## 2014-12-09 NOTE — Patient Instructions (Addendum)
You likely have COPD- this is a chronic lung disease caused by smoking. Quitting smoking is the best thing you can do to stop your COPD from worsening and hopefully improve your lungs  Start on the spiriva once a day- this will improve your breathing.  As your lungs continue to recover we can hopefully stop using this.   We are going to treat you for this illness with  Prednisone (steroid) Doxycycline (antibiotic) spiriva inhaler (special COPD inhaler) Use the spirivia once a day for your COPD, and the albuterol as needed

## 2014-12-09 NOTE — Progress Notes (Signed)
[facility] [address], [place] [address] [phone]  Date:  12/09/2014   Name:  Anetha Slagel   DOB:  December 16, 1950   MRN:  [id number]  PCP:  Leandrew Koyanagi, MD    Chief Complaint: Shortness of Breath; Nasal Congestion; and Cough   History of Present Illness:  Coutney Wildermuth is a 64 y.o. very pleasant female patient who presents with the following:  History of COPD- she had smoked since her teens but quit a month ago after illness.  Here today with recurrent illness for about one week.  She notes cough and congestion, coughing up green mucus.  She is also blowing material out of her nose. She has not noted a fever.     She was seen on 3/15 with COPD exacerbation (pt states "I had pneumonia") and was treated with albuterol, doxycycline and predsnione.   She did get well in between, but her sx seem to have come back over the last week.  She also feels that the pollen makes her worse- she has more sx when outdoors.    She is not regularly using any inhaler at home  She is taking zyrtec and rhinocort for her allergies She does not have glaucoma  Patient Active Problem List   Diagnosis Date Noted  . COPD exacerbation 11/02/2014  . Lumbago 07/16/2014  . Bilateral hand pain 07/16/2014  . Nicotine addiction 06/06/2012  . HTN (hypertension) 06/04/2012  . Hyperlipidemia 06/04/2012  . Depression 06/04/2012  . ADD (attention deficit disorder) 06/04/2012  . AR (allergic rhinitis) 06/04/2012    Past Medical History  Diagnosis Date  . Allergy   . Arthritis   . Osteoporosis     Past Surgical History  Procedure Laterality Date  . Joint replacement    . Replacement total knee  2013    History  Substance Use Topics  . Smoking status: Former Smoker -- 0.50 packs/day for 48 years    Types: Cigarettes    Quit date: 11/04/2014  . Smokeless tobacco: Not on file  . Alcohol Use: No    No family history on file.  No Known Allergies  Medication  list has been reviewed and updated.  Current Outpatient Prescriptions on File Prior to Visit  Medication Sig Dispense Refill  . albuterol (PROVENTIL HFA;VENTOLIN HFA) 108 (90 BASE) MCG/ACT inhaler Inhale 1-2 puffs into the lungs every 6 (six) hours as needed for wheezing or shortness of breath. 1 Inhaler 3  . atomoxetine (STRATTERA) 100 MG capsule TAKE ONE CAPSULE EVERY DAY 90 capsule 3  . clobetasol cream (TEMOVATE) 0.05 % Apply topically 2 (two) times daily. 30 g 5  . FLUoxetine (PROZAC) 40 MG capsule Take 1 capsule (40 mg total) by mouth daily. 90 capsule 3  . fluticasone (FLONASE) 50 MCG/ACT nasal spray Place 2 sprays into both nostrils daily. 16 g 6  . ipratropium (ATROVENT) 0.03 % nasal spray Place 2 sprays into both nostrils 2 (two) times daily. 30 mL 0  . losartan-hydrochlorothiazide (HYZAAR) 100-12.5 MG per tablet Take 1 tablet by mouth daily. 90 tablet 3  . meclizine (ANTIVERT) 25 MG tablet Take 0.5-1 tablets (12.5-25 mg total) by mouth 3 (three) times daily as needed for dizziness or nausea. 30 tablet 2  . meloxicam (MOBIC) 15 MG tablet TAKE 1 TABLET EVERY DAY 90 tablet 3  . traZODone (DESYREL) 50 MG tablet TAKE 1 TABLET AT BEDTIME 90 tablet 3   No current facility-administered medications on file prior to  visit.    Review of Systems:  As per HPI- otherwise negative.   Physical Examination: Filed Vitals:   12/09/14 1219  BP: 120/68  Pulse: 100  Temp: 98.1 F (36.7 C)  Resp: 18   Filed Vitals:   12/09/14 1219  Height: 5' 0.5" (1.537 m)  Weight: 137 lb (62.143 kg)   Body mass index is 26.31 kg/(m^2). Ideal Body Weight: Weight in (lb) to have BMI = 25: 129.9  GEN: WDWN, NAD, Non-toxic, A & O x 3, mild overweight, looks well.  Some cough HEENT: Atraumatic, Normocephalic. Neck supple. No masses, No LAD.  Bilateral TM wnl, oropharynx normal.  PEERL,EOMI.   Ears and Nose: No external deformity. CV: RRR, No M/G/R. No JVD. No thrill. No extra heart sounds. PULM: CTA B,  no wheezes, crackles, rhonchi. No retractions. No resp. distress. No accessory muscle use. ABD: S, NT, ND, +BS. No rebound. No HSM. EXTR: No c/c/e NEURO Normal gait.  PSYCH: Normally interactive. Conversant. Not depressed or anxious appearing.  Calm demeanor.   Given duoneb and it did help with her wheezing and air movement Assessment and Plan: COPD exacerbation - Plan: albuterol (PROVENTIL) (2.5 MG/3ML) 0.083% nebulizer solution 2.5 mg, ipratropium (ATROVENT) nebulizer solution 0.5 mg, tiotropium (SPIRIVA HANDIHALER) 18 MCG inhalation capsule, doxycycline (VIBRAMYCIN) 100 MG capsule, predniSONE (DELTASONE) 20 MG tablet  Cough  Wheezing  Former long-term smoker, recently quit.  Here today with another COPD exacerbation,.  She felt better after duoneb Will treat with prednisone, doxy, and add spiriva.  Encouraged her to use her albuterol more regularly for a few days She will follow-up if not continuing to improve  Signed Lamar Blinks, MD

## 2015-01-21 ENCOUNTER — Encounter: Payer: Self-pay | Admitting: *Deleted

## 2015-04-02 ENCOUNTER — Ambulatory Visit (INDEPENDENT_AMBULATORY_CARE_PROVIDER_SITE_OTHER): Payer: BC Managed Care – PPO | Admitting: Family Medicine

## 2015-04-02 VITALS — BP 126/78 | HR 102 | Temp 97.7°F | Resp 16 | Ht 60.0 in | Wt 138.8 lb

## 2015-04-02 DIAGNOSIS — N3000 Acute cystitis without hematuria: Secondary | ICD-10-CM

## 2015-04-02 DIAGNOSIS — S46912A Strain of unspecified muscle, fascia and tendon at shoulder and upper arm level, left arm, initial encounter: Secondary | ICD-10-CM | POA: Diagnosis not present

## 2015-04-02 DIAGNOSIS — E785 Hyperlipidemia, unspecified: Secondary | ICD-10-CM

## 2015-04-02 DIAGNOSIS — R300 Dysuria: Secondary | ICD-10-CM

## 2015-04-02 LAB — POCT UA - MICROSCOPIC ONLY
Bacteria, U Microscopic: NEGATIVE
Casts, Ur, LPF, POC: NEGATIVE
Crystals, Ur, HPF, POC: NEGATIVE
Mucus, UA: NEGATIVE
RBC, urine, microscopic: NEGATIVE
Yeast, UA: NEGATIVE

## 2015-04-02 LAB — POCT URINALYSIS DIPSTICK
Bilirubin, UA: NEGATIVE
Blood, UA: NEGATIVE
GLUCOSE UA: NEGATIVE
Ketones, UA: NEGATIVE
NITRITE UA: NEGATIVE
PH UA: 6.5
PROTEIN UA: NEGATIVE
Spec Grav, UA: 1.015
Urobilinogen, UA: 0.2

## 2015-04-02 MED ORDER — DICLOFENAC SODIUM 75 MG PO TBEC: 75.0000 mg | DELAYED_RELEASE_TABLET | Freq: Two times a day (BID) | ORAL | Status: DC

## 2015-04-02 MED ORDER — ROSUVASTATIN CALCIUM 10 MG PO TABS: ORAL_TABLET | ORAL | Status: DC

## 2015-04-02 MED ORDER — NITROFURANTOIN MONOHYD MACRO 100 MG PO CAPS: 100.0000 mg | ORAL_CAPSULE | Freq: Two times a day (BID) | ORAL | Status: DC

## 2015-04-02 NOTE — Patient Instructions (Addendum)
Drink lots of fluids  Take the nitrofurantoin one pill twice daily for bladder infection  Take the diclofenac one twice daily for the upper arm pain. While you are on diclofenac do not take any of the locks a cam or any ibuprofen or Aleve. After you complete the course of diclofenac you can go back to the meloxicam.  In addition to the above you can take acetaminophen (Tylenol) 500 mg 2 tablets 3 times daily if needed for additional arthritis pain relief. Maximum daily dose of acetaminophen is 3000 mg.  Taking ibuprofen and meloxicam can harm your kidneys so I would discourage that.  Try taking the Crestor 5 mg 1 weekly for about a month, then 1 twice weekly. Although it is still brand name only I believe, is going to send come out in a generic understand.  After being on the Crestor for 4-6 months return for lab recheck

## 2015-04-02 NOTE — Progress Notes (Signed)
Subjective:  Patient ID: Sheryl [name], female    DOB: 1951-06-27  Age: 64 y.o. MRN: [id number]  Patient is here for several things. She has dysuria for the last week. It's getting worse. She has frequency. No fever. No flank pain. She has been hurting in her right upper arm for the last month or 2 since she had a fall. She bruised her knee and her shoulder at that time. She wonders why it is continued to hurt. The meloxicam that she has been on long-term helps her some but not real well. She doesn't like Tylenol. She's been taking some ibuprofen along with the meloxicam. She also is concerned because her cholesterol runs high. She asked about what other options were for that.   Objective:   Marland Kitchen Pleasant lady alert and oriented. Tender in her right upper arm muscles. Not really in the shoulder. Some pain on abduction of the shoulder. She has good range of motion of her neck. Good range of motion of the shoulder. Good grip her hand. Chest clear. Heart regular without murmurs. No CVA tenderness. Abdomen soft without mass or tenderness. Results for orders placed or performed in visit on 04/02/15  POCT UA - Microscopic Only  Result Value Ref Range   WBC, Ur, HPF, POC 8-10    RBC, urine, microscopic neg    Bacteria, U Microscopic neg    Mucus, UA neg    Epithelial cells, urine per micros 0-4    Crystals, Ur, HPF, POC neg    Casts, Ur, LPF, POC neg    Yeast, UA neg   POCT urinalysis dipstick  Result Value Ref Range   Color, UA yellow    Clarity, UA clear    Glucose, UA neg    Bilirubin, UA neg    Ketones, UA neg    Spec Grav, UA 1.015    Blood, UA neg    pH, UA 6.5    Protein, UA neg    Urobilinogen, UA 0.2    Nitrite, UA neg    Leukocytes, UA large (3+) (A) Negative    Assessment & Plan:   Assessment:  Right shoulder pain and strain Urinary tract infection/cystitis Hyperlipidemia   Plan:  Check urinalysis. It shows infection so we did a urine culture. Discussed with her  gradually taking more Crestor in seeing if she can tolerate it from 1 a week gradually upward. Talk about risk of NSAID agents. Advise acetaminophen in addition to an inside. We will try some diclofenac for couple of weeks and then back to the meloxicam.  Patient Instructions  Drink lots of fluids  Take the nitrofurantoin one pill twice daily for bladder infection  Take the diclofenac one twice daily for the upper arm pain. While you are on diclofenac do not take any of the locks a cam or any ibuprofen or Aleve. After you complete the course of diclofenac you can go back to the meloxicam.  In addition to the above you can take acetaminophen (Tylenol) 500 mg 2 tablets 3 times daily if needed for additional arthritis pain relief. Maximum daily dose of acetaminophen is 3000 mg.  Taking ibuprofen and meloxicam can harm your kidneys so I would discourage that.  Try taking the Crestor 5 mg 1 weekly for about a month, then 1 twice weekly. Although it is still brand name only I believe, is going to send come out in a generic understand.  After being on the Crestor for 4-6 months return for lab recheck  HOPPER,DAVID, MD 04/02/2015

## 2015-04-05 LAB — URINE CULTURE: Colony Count: 75000

## 2015-04-08 ENCOUNTER — Telehealth: Payer: Self-pay

## 2015-04-08 NOTE — Telephone Encounter (Signed)
Pt stated that this morning she had diarrhea. She is currently taking nitrofurantoin, macrocrystal-monohydrate, (MACROBID) and diclofenac (VOLTAREN). She doesn't like them and wants to be on different meds.  Please advise [phone]

## 2015-04-08 NOTE — Telephone Encounter (Signed)
Pt was seen by Dr. Linna Darner on 8/13 and given nitrofurantoin and macrobid and diclofenac.  She stated that she has finished the nitrofurantoin and macrobid.shes diclofenac and she stated that she has been having diarreha since starting on these medications.  She cant get the diarrhea to stop unless she takes the immodium and she is having stomach cramps.  She denies any blood in her stools.  She stated that she has been afraid to eat due to the diarrhea.  Patient requested that this be sent to Dr. Laney Pastor as he has been seeing her for 20 + years.  Please advise. Thank you.

## 2015-04-09 NOTE — Telephone Encounter (Signed)
If no fever then we will call in Bentyl 20 qid for 5 days Then if not well come in to test [facility] to eat soft food-small amounts Also should take probiotics for 2 weeks

## 2015-04-12 NOTE — Telephone Encounter (Signed)
Pt is starting to feel better. She does not need the Bentyl anymore. Advised pt to try a probiotic for a couple weeks to help with gas. It was the "big pill" that caused her diarrhea.

## 2015-04-29 ENCOUNTER — Ambulatory Visit (INDEPENDENT_AMBULATORY_CARE_PROVIDER_SITE_OTHER): Payer: BC Managed Care – PPO | Admitting: Family Medicine

## 2015-04-29 VITALS — BP 116/74 | HR 70 | Temp 98.3°F | Resp 16 | Ht 61.75 in | Wt 138.4 lb

## 2015-04-29 DIAGNOSIS — Z780 Asymptomatic menopausal state: Secondary | ICD-10-CM

## 2015-04-29 DIAGNOSIS — N3000 Acute cystitis without hematuria: Secondary | ICD-10-CM

## 2015-04-29 DIAGNOSIS — N898 Other specified noninflammatory disorders of vagina: Secondary | ICD-10-CM

## 2015-04-29 DIAGNOSIS — R300 Dysuria: Secondary | ICD-10-CM

## 2015-04-29 LAB — POCT URINALYSIS DIPSTICK
Bilirubin, UA: NEGATIVE
GLUCOSE UA: NEGATIVE
KETONES UA: 15
Nitrite, UA: NEGATIVE
PROTEIN UA: 100
Spec Grav, UA: 1.02
UROBILINOGEN UA: 0.2
pH, UA: 5.5

## 2015-04-29 LAB — POCT UA - MICROSCOPIC ONLY
Casts, Ur, LPF, POC: NEGATIVE
Crystals, Ur, HPF, POC: NEGATIVE
Mucus, UA: NEGATIVE
Yeast, UA: NEGATIVE

## 2015-04-29 MED ORDER — ESTRADIOL 10 MCG VA TABS: ORAL_TABLET | VAGINAL | Status: DC

## 2015-04-29 MED ORDER — CEPHALEXIN 500 MG PO CAPS: 500.0000 mg | ORAL_CAPSULE | Freq: Two times a day (BID) | ORAL | Status: DC

## 2015-04-29 NOTE — Progress Notes (Addendum)
[facility] [address], [place] [address] [phone]  Date:  04/29/2015   Name:  Sheryl [name]   DOB:  1951/04/26   MRN:  [id number]  PCP:  Leandrew Koyanagi, MD    Chief Complaint: Urinary Tract Infection   History of Present Illness:  Sheryl [name] is a 64 y.o. very pleasant female patient who presents with the following:  Here today with suspect UTI She has noted sx of urinary frequency, dysuria, no hematuria She has noted sx for 3 or 4 days She had sx of the same about 3 weeks ago- treated with macrobid but this caused GI symptoms No fever, back pain, belly pain or vomiting  She notes that she recently became sexually active again after a several year hiatus as she has a new partner.  They do not have intercourse but do otherwise practice sexual activity.  She notes vaginal dryness that can make this uncomfortable, and wonders if this may be contributing to her UTIs as well  She also thinks that she was told she had osteopenia or porosis in the past- last dexa several years ago.  Would like to update this study  No history of cancers or heart problems.  She does have an intact uterus  She also quit smoking (does "sneak" an occasional cig)  Patient Active Problem List   Diagnosis Date Noted  . COPD (chronic obstructive pulmonary disease) 12/09/2014  . COPD exacerbation 11/02/2014  . Lumbago 07/16/2014  . Bilateral hand pain 07/16/2014  . Nicotine addiction 06/06/2012  . HTN (hypertension) 06/04/2012  . Hyperlipidemia 06/04/2012  . Depression 06/04/2012  . ADD (attention deficit disorder) 06/04/2012  . AR (allergic rhinitis) 06/04/2012    Past Medical History  Diagnosis Date  . Allergy   . Arthritis   . Osteoporosis   . COPD (chronic obstructive pulmonary disease) 12/09/2014    Past Surgical History  Procedure Laterality Date  . Joint replacement    . Replacement total knee  2013    Social History  Substance Use Topics  .  Smoking status: Former Smoker -- 0.50 packs/day for 48 years    Types: Cigarettes    Quit date: 11/04/2014  . Smokeless tobacco: None  . Alcohol Use: No    History reviewed. No pertinent family history.  No Known Allergies  Medication list has been reviewed and updated.  Current Outpatient Prescriptions on File Prior to Visit  Medication Sig Dispense Refill  . albuterol (PROVENTIL HFA;VENTOLIN HFA) 108 (90 BASE) MCG/ACT inhaler Inhale 1-2 puffs into the lungs every 6 (six) hours as needed for wheezing or shortness of breath. 1 Inhaler 3  . atomoxetine (STRATTERA) 100 MG capsule TAKE ONE CAPSULE EVERY DAY 90 capsule 3  . clobetasol cream (TEMOVATE) 0.05 % Apply topically 2 (two) times daily. 30 g 5  . diclofenac (VOLTAREN) 75 MG EC tablet Take 1 tablet (75 mg total) by mouth 2 (two) times daily. 30 tablet 0  . FLUoxetine (PROZAC) 40 MG capsule Take 1 capsule (40 mg total) by mouth daily. 90 capsule 3  . fluticasone (FLONASE) 50 MCG/ACT nasal spray Place 2 sprays into both nostrils daily. 16 g 6  . ipratropium (ATROVENT) 0.03 % nasal spray Place 2 sprays into both nostrils 2 (two) times daily. 30 mL 0  . losartan-hydrochlorothiazide (HYZAAR) 100-12.5 MG per tablet Take 1 tablet by mouth daily. 90 tablet 3  . meclizine (ANTIVERT) 25 MG tablet Take 0.5-1 tablets (12.5-25 mg total) by mouth  3 (three) times daily as needed for dizziness or nausea. 30 tablet 2  . meloxicam (MOBIC) 15 MG tablet TAKE 1 TABLET EVERY DAY 90 tablet 3  . rosuvastatin (CRESTOR) 10 MG tablet Take as directed for cholesterol 30 tablet 3  . tiotropium (SPIRIVA HANDIHALER) 18 MCG inhalation capsule Place 1 capsule (18 mcg total) into inhaler and inhale daily. 30 capsule 12  . traZODone (DESYREL) 50 MG tablet TAKE 1 TABLET AT BEDTIME 90 tablet 3  . nitrofurantoin, macrocrystal-monohydrate, (MACROBID) 100 MG capsule Take 1 capsule (100 mg total) by mouth 2 (two) times daily. (Patient not taking: Reported on 04/29/2015) 10  capsule 0   No current facility-administered medications on file prior to visit.    Review of Systems:  As per HPI- otherwise negative.   Physical Examination: Filed Vitals:   04/29/15 1348  BP: 116/74  Pulse: 102  Temp: 98.3 F (36.8 C)  Resp: 16   Filed Vitals:   04/29/15 1348  Height: 5' 1.75" (1.568 m)  Weight: 138 lb 6.4 oz (62.778 kg)   Body mass index is 25.53 kg/(m^2). Ideal Body Weight: Weight in (lb) to have BMI = 25: 135.3  GEN: WDWN, NAD, Non-toxic, A & O x 3, looks well HEENT: Atraumatic, Normocephalic. Neck supple. No masses, No LAD. Ears and Nose: No external deformity. CV: RRR, No M/G/R. No JVD. No thrill. No extra heart sounds. PULM: CTA B, no wheezes, crackles, rhonchi. No retractions. No resp. distress. No accessory muscle use. ABD: S, NT, ND. No rebound. No HSM.  No CVA tenderness EXTR: No c/c/e NEURO Normal gait.  PSYCH: Normally interactive. Conversant. Not depressed or anxious appearing.  Calm demeanor.  Gu: vaginal / vulvar dryness and atrophy is noted. No skin lesion or discharge noted  Results for orders placed or performed in visit on 04/29/15  POCT UA - Microscopic Only  Result Value Ref Range   WBC, Ur, HPF, POC TNTC    RBC, urine, microscopic 10-12    Bacteria, U Microscopic moderate    Mucus, UA neg    Epithelial cells, urine per micros 0-4    Crystals, Ur, HPF, POC neg    Casts, Ur, LPF, POC neg    Yeast, UA neg   POCT urinalysis dipstick  Result Value Ref Range   Color, UA yellow    Clarity, UA clear    Glucose, UA neg    Bilirubin, UA neg    Ketones, UA 15    Spec Grav, UA 1.020    Blood, UA moderate    pH, UA 5.5    Protein, UA 100    Urobilinogen, UA 0.2    Nitrite, UA neg    Leukocytes, UA moderate (2+) (A) Negative     Assessment and Plan: Burning with urination - Plan: POCT UA - Microscopic Only, POCT urinalysis dipstick, Urine culture, cephALEXin (KEFLEX) 500 MG capsule  Vaginal dryness - Plan: Estradiol 10  MCG TABS vaginal tablet  Post-menopausal - Plan: DG Bone Density  Suspect recurrent UTI Treat with keflex while we await culture Discussed vaginal dryness- she is interested in using a topical estrogen therapy.  Discussed possible risk of certain cancers and CV risk. Rx vagifem vaginal tablet to use twice a week; place vaginally with applicator.  Due to limited and controlled dose progesterone not needed according to uptodate.   Will refer for a dexa scan   Signed Lamar Blinks, MD   Called her on 9/12- her UTI is resistant to the abx  we choose for her.  Stop keflex and change to cipro for 3 days.  Called into her pharm.  She is partially better

## 2015-04-29 NOTE — Patient Instructions (Addendum)
I will be in touch with your urine culture asap  We are also setting up a bone density scan for you  Use the keflex for one week for UTI while we await culture- let me know if you are not improved in the next couple of days  Try the estrogen vaginal tablet twice a week as needed- let me know if any trouble with this medication

## 2015-05-02 LAB — URINE CULTURE

## 2015-05-02 MED ORDER — CIPROFLOXACIN HCL 250 MG PO TABS: 250.0000 mg | ORAL_TABLET | Freq: Two times a day (BID) | ORAL | Status: DC

## 2015-05-02 NOTE — Addendum Note (Signed)
Addended by: Lamar Blinks C on: 05/02/2015 08:52 AM   Modules accepted: Orders, Medications

## 2015-06-01 ENCOUNTER — Ambulatory Visit (INDEPENDENT_AMBULATORY_CARE_PROVIDER_SITE_OTHER): Payer: BC Managed Care – PPO | Admitting: Family Medicine

## 2015-06-01 VITALS — BP 114/74 | HR 102 | Temp 98.2°F | Resp 18 | Ht 61.75 in | Wt 140.1 lb

## 2015-06-01 DIAGNOSIS — Z23 Encounter for immunization: Secondary | ICD-10-CM

## 2015-06-01 DIAGNOSIS — A499 Bacterial infection, unspecified: Secondary | ICD-10-CM | POA: Diagnosis not present

## 2015-06-01 DIAGNOSIS — N390 Urinary tract infection, site not specified: Secondary | ICD-10-CM | POA: Diagnosis not present

## 2015-06-01 DIAGNOSIS — F17200 Nicotine dependence, unspecified, uncomplicated: Secondary | ICD-10-CM | POA: Diagnosis not present

## 2015-06-01 DIAGNOSIS — R309 Painful micturition, unspecified: Secondary | ICD-10-CM | POA: Diagnosis not present

## 2015-06-01 DIAGNOSIS — Z720 Tobacco use: Secondary | ICD-10-CM | POA: Diagnosis not present

## 2015-06-01 DIAGNOSIS — R300 Dysuria: Secondary | ICD-10-CM

## 2015-06-01 LAB — POCT URINALYSIS DIP (MANUAL ENTRY)
Bilirubin, UA: NEGATIVE
Glucose, UA: NEGATIVE
Ketones, POC UA: NEGATIVE
Nitrite, UA: NEGATIVE
Protein Ur, POC: 30 — AB
Spec Grav, UA: 1.015
Urobilinogen, UA: 0.2
pH, UA: 6

## 2015-06-01 LAB — POC MICROSCOPIC URINALYSIS (UMFC): Mucus: ABSENT

## 2015-06-01 MED ORDER — NICOTINE 14 MG/24HR TD PT24: 14.0000 mg | MEDICATED_PATCH | Freq: Every day | TRANSDERMAL | Status: DC

## 2015-06-01 MED ORDER — CEPHALEXIN 500 MG PO CAPS: 500.0000 mg | ORAL_CAPSULE | Freq: Two times a day (BID) | ORAL | Status: DC

## 2015-06-01 NOTE — Patient Instructions (Signed)
RESOURCE GUIDE ° °Chronic Pain Problems: °Contact [facility]  [phone] °Patients need to be referred by their primary care doctor. ° °Insufficient Money for Medicine: °Contact United Way:  call (888) 892-1162 ° °No Primary Care Doctor: °- Call [facility]  [phone] - can help you locate a primary care doctor that  accepts your insurance, provides certain services, etc. °- Physician Referral Service- 1-800-533-3463 ° °Agencies that provide inexpensive medical care: °- [facility]  [phone] °- [facility]  [phone] °- [facility]  [phone] °- [facility]  832-4777 °- [facility]  [phone] °- [facility]  272-1050 ° °Medicaid-accepting [place] Providers: °- [facility]- [address] ° [phone], Mon-Fri 9am-7pm, Sat 9am-1pm °- [facility]- [address], [address] ° [phone] °- [facility]- [address], [address] ° [phone] °- [facility]- [address] [place] [address] ° [phone] °- Veita [facility]- [address], [phone] ° Only accepts [place] Access Medicaid patients after they have their name  applied to their card ° °Self Pay (no insurance) in [place]: °- Sickle Cell Patients - [facility] ° [address], [phone] °- [facility]- [address] ° [phone] °      -     [facility]- [address] °      -     [facility]- see information above (Speak to Pam H if you do not have insurance) °      -  [facility] [place]- [address],  [phone] °      -  [facility]- [address] [place] [address], [phone] °      -  Dr Osei-Bonsu-  [address], [place], [phone] °      -  [facility] - [address], [phone] °      -  [facility]- [address] [place] [address], [phone], also  [address], [phone] °      -     [facility]- [address], [phone], 1st & 3rd Saturday °        every month, 10am-1pm ° -     [facility] °  [address], [place]. °  Phone:  [phone], Fax:  [phone]. Hours of Operation:  9 am - 6 pm, M-F. ° -     [facility] °  [address], [place] °  Phone: [phone], Fax: [phone]. Hours of Operation:  8:30 am - 5:30 pm, M-F. ° °[facility] °[address] °, [place] [address] °(336) 832-4777 ° °[facility] °[address] °[place], [place] [address] °(336) [phone] ° °1) Find a Doctor and Pay Out of Pocket °Although you won't have to find out who is covered by your insurance plan, it is a good idea to ask around and get recommendations. You will then need to call the office and see if the doctor you have chosen will accept you as a new patient and what types of options they offer for patients who are self-pay. Some doctors offer discounts or will set up payment plans for their patients who do not have insurance, but   you will need to ask so you aren't surprised when you get to your appointment. ° °2) Contact Your Local Health Department °Not all health departments have doctors that can see patients for sick visits, but many do, so it is worth a call to see if yours does. If you don't know where your local health department is, you can check in your phone book. The CDC also has a tool to help you locate your state's health department, and many state websites also have listings of all of their local health departments. ° °3) Find a Walk-in Clinic °If your illness is not likely to be very severe or complicated, you may want to try a walk in clinic. These are popping up all over the country in pharmacies, drugstores, and shopping centers. They're usually staffed by nurse practitioners or physician assistants that have been trained  to treat common illnesses and complaints. They're usually fairly quick and inexpensive. However, if you have serious medical issues or chronic medical problems, these are probably not your best option ° °STD Testing °- [facility] [place], [facility], [address], [place], phone [phone] or [phone].  Monday - Friday, call for an appointment. °- [facility] [place], [facility], [address], [place], phone [phone] or [phone].  Monday - Friday, call for an appointment. ° °Abuse/Neglect: °- [place] Child Abuse Hotline (336) 641-3795 °- [place] Child Abuse Hotline 800-378-5315 (After Hours) ° °Emergency Shelter:  [facility] (336) 271-5985 ° °Maternity Homes: °- [facility] (336) 275-9566 °- [facility] (704) 372-4663 ° °MRSA Hotline #:   832-7006 ° °Dental Assistance °If unable to pay or uninsured, contact:  [place] [facility] to become qualified for the adult dental clinic. ° °Patients with Medicaid: [facility] °[address], [phone] °[address], [phone] ° °If unable to pay, or uninsured, contact [place] [facility] ([phone] in [place], 842-7733 in [place]) to become qualified for the adult dental clinic ° °[facility] °[address] °[place], [place] [address] °(336) 272-4177 °www.[facility].com ° °Other Low-Cost Community Dental Services: °- [facility]- [address], [place], [place], [address], [phone], Ext. 123, 2nd and 4th Thursday of the month at 6:30am.  10 clients each day by appointment, can sometimes see walk-in patients if someone does not show for an appointment. °- [facility]- [address], [place], [place], [address], [phone] °- [facility]- [address], [place], [place], [address], [phone] °- [place]  [facility]- 342-8273 °- [facility]- 703-3100 °- [facility]- 570-6415 ° °     Behavioral Health Resources in the Community ° °Intensive Outpatient Programs: °[facility]      °[address] °[place], [place] °336-878-6098 °Both a day and evening program °      °[facility]     °[address]        °[place], [place] [address] °336-832-9800        ° °[facility] °[address] °[place] °336-882-2125 ° °[facility] °ACCESS LINE: 1-800-853-5163 or 336-641-4981 °[address] °[place], [place] [address] °Http://www.[email or web address].[email or web address]/adult.htm ° ° °Substance Abuse Resources: °- [facility]  336-882-2125 °- [facility] 336-784-9470 °- [facility] 336-285-9073 °- [facility] 336-845-3988 °-   Residential & Outpatient Substance Abuse Program  800-659-3381 ° °Psychological Services: °- [facility]  832-9600 °- [facility]  378-7881 °- [facility], [address], [place], ACCESS LINE: 1-800-853-5163 or 336-641-4981, Http://www.[facility].com/services/adult.htm ° °Mobile Crisis Teams:         °                               °[facility]         °Mobile Crisis Care Unit °1-877-626-1772       °      °Assertive °[facility] °[address] [place] °336-834-9664 °                                        °Interventionist °Sharon DeEsch °[address] °[employer] [place] °336-554-5454 ° °Self-Help/Support Groups: °[facility] Variety of support groups °373-1402 (call for more info) ° °Narcotics Anonymous (NA) °[facility] °[address] °[place] - 2 meetings at this location ° °Residential Treatment Programs:  °[facility]      °[address]        °[place]       °866-801-8205        ° °[facility] °[address] °[place], [place]  [address] °704-293-8524 ° °[facility]  °[address] °[place], [place] [address] °336-845-3988 °Admissions: 8am-3pm M-F ° °[facility]     °[address]        °[place], [place] [address]       °336-841-1104        ° °[facility] °[address] °[place], [place] °336-379-7146 ° °[facility] °[address] °[place], [place] °336-285-9073 ° °[facility] - Intensive Outpatient      °[address]     °[place], [place]       °852-3033        ° °[facility]     °[address] °[place], [place] °877-615-2722 or 336-784-9470 ° °[facility] ([facility]), Medicaid °[address] °[place], [place] °336-227-7417 ° °[facility]                                               °[address] °[place] °800-659-3381 ° °[place] [facility]: °[facility]- 1-888-581-9988              ° °General Therapy                                                °Julie Brannon, PhD        °[address]                                       °[place],  [address]         °336-349-5553   °Insurance ° °[facility]   °[address] °[place], [place] [address] °336-349-4454 ° °[facility] °[address] [place], [place] [address] °336-342-8316 °Insurance/Medicaid/sponsorship through [facility] ° °[facility]                                              °[address]                                        °[place], [place] [address]    °Therapy/tele-psych/case         °336-342-8316        °  °[facility] °[address]  ° [place], [place]  [address]  °Adolescent/group home/case management °336-349-2233  °                                         °Julia Brannon PhD       °General therapy       °Insurance   °336-951-0000        ° °Dr. Arfeen, Insurance, M-F °336- 349-4544 ° °[facility] [place]  [facility]  [employer] [facility] [facility] °[address]                 [address]         [address]  °[place]                                               [place] °Phone:  [phone]                                  Phone:  [phone]                   Phone:  342-8140 ° °[facility], 342-8316 °- [place] [facility] - [facility]- 1-888-581-9988 °      -     [facility] in [place], [address], °            336-349-4454, Insurance ° °[place] Child Abuse Hotline °(336) 342-1394 or (336) 342-3537 (After Hours) ° ° °

## 2015-06-01 NOTE — Progress Notes (Signed)
Chief Complaint:  Chief Complaint  Patient presents with  . Urinary Tract Infection    C/O tinge on blood noticed when wiping this morning & hurst when urinating since yesterday  . Flu Vaccine    HPI: Sheryl [name] is a 64 y.o. female who reports to [facility] today complaining of urianry frequencym hematuria and also dysruia. She has had it frequently, her BF is insertinghis finger inside her during sex since he ahs ED and cannot actually have sex with her int he traditional sense. She states his fingers are clean . She She denies any f/c/n/v/back pain. This started yesterday  She would like a flu vaccine as well  She quit smoking and now due to her grandosn being difficult and not doing well, she ahs gone back to smoking. She owuld like some nicotine patch . Doe snot want chantix  Past Medical History  Diagnosis Date  . Allergy   . Arthritis   . Osteoporosis   . COPD (chronic obstructive pulmonary disease) ([place]) 12/09/2014   Past Surgical History  Procedure Laterality Date  . Joint replacement    . Replacement total knee  2013   Social History   Social History  . Marital Status: Divorced    Spouse Name: N/A  . Number of Children: N/A  . Years of Education: N/A   Social History Main Topics  . Smoking status: Former Smoker -- 0.50 packs/day for 48 years    Types: Cigarettes    Quit date: 11/04/2014  . Smokeless tobacco: None  . Alcohol Use: No  . Drug Use: No  . Sexual Activity: Yes   Other Topics Concern  . None   Social History Narrative   No family history on file. Allergies  Allergen Reactions  . Macrobid [Nitrofurantoin Monohyd Macro]     GI upset   Prior to Admission medications   Medication Sig Start Date End Date Taking? Authorizing Provider  albuterol (PROVENTIL HFA;VENTOLIN HFA) 108 (90 BASE) MCG/ACT inhaler Inhale 1-2 puffs into the lungs every 6 (six) hours as needed for wheezing or shortness of breath. 11/16/14  Yes Tereasa Coop, PA-C    atomoxetine (STRATTERA) 100 MG capsule TAKE ONE CAPSULE EVERY DAY 07/16/14  Yes Leandrew Koyanagi, MD  clobetasol cream (TEMOVATE) 0.05 % Apply topically 2 (two) times daily. 07/21/14  Yes Harrison Mons, PA-C  Estradiol 10 MCG TABS vaginal tablet Use 2x/ weekly for vaginal atrophy 04/29/15  Yes Gay Filler Copland, MD  FLUoxetine (PROZAC) 40 MG capsule Take 1 capsule (40 mg total) by mouth daily. 07/16/14  Yes Leandrew Koyanagi, MD  fluticasone (FLONASE) 50 MCG/ACT nasal spray Place 2 sprays into both nostrils daily. 07/16/14  Yes Leandrew Koyanagi, MD  ipratropium (ATROVENT) 0.03 % nasal spray Place 2 sprays into both nostrils 2 (two) times daily. 11/02/14  Yes Bennett Scrape V, PA-C  losartan-hydrochlorothiazide (HYZAAR) 100-12.5 MG per tablet Take 1 tablet by mouth daily. 07/16/14  Yes Leandrew Koyanagi, MD  meclizine (ANTIVERT) 25 MG tablet Take 0.5-1 tablets (12.5-25 mg total) by mouth 3 (three) times daily as needed for dizziness or nausea. 07/16/14  Yes Leandrew Koyanagi, MD  meloxicam (MOBIC) 15 MG tablet TAKE 1 TABLET EVERY DAY 07/16/14  Yes Leandrew Koyanagi, MD  rosuvastatin (CRESTOR) 10 MG tablet Take as directed for cholesterol 04/02/15  Yes Posey Boyer, MD  tiotropium (SPIRIVA HANDIHALER) 18 MCG inhalation capsule Place 1 capsule (18 mcg total) into inhaler and inhale daily.  12/09/14  Yes Darreld Mclean, MD  traZODone (DESYREL) 50 MG tablet TAKE 1 TABLET AT BEDTIME 07/16/14  Yes Leandrew Koyanagi, MD  ciprofloxacin (CIPRO) 250 MG tablet Take 1 tablet (250 mg total) by mouth 2 (two) times daily. Patient not taking: Reported on 06/01/2015 05/02/15   Darreld Mclean, MD  diclofenac (VOLTAREN) 75 MG EC tablet Take 1 tablet (75 mg total) by mouth 2 (two) times daily. Patient not taking: Reported on 06/01/2015 04/02/15   Posey Boyer, MD  nicotine (NICODERM CQ - DOSED IN MG/24 HOURS) 14 mg/24hr patch Place 1 patch (14 mg total) onto the skin daily. 06/01/15   Lorriane Dehart P Earnestene Angello, DO     ROS:  The patient denies fevers, chills, night sweats, unintentional weight loss, chest pain, palpitations, wheezing, dyspnea on exertion, nausea, vomiting, abdominal pain,, melena, numbness, weakness, or tingling.   All other systems have been reviewed and were otherwise negative with the exception of those mentioned in the HPI and as above.    PHYSICAL EXAM: Filed Vitals:   06/01/15 1933  BP: 114/74  Pulse: 102  Temp: 98.2 F (36.8 C)  Resp: 18   Body mass index is 25.85 kg/(m^2).   General: Alert, no acute distress HEENT:  Normocephalic, atraumatic, oropharynx patent. EOMI, PERRLA Cardiovascular:  Regular rate and rhythm, no rubs murmurs or gallops.  No Carotid bruits, radial pulse intact. No pedal edema.  Respiratory: Clear to auscultation bilaterally.  No wheezes, rales, or rhonchi.  No cyanosis, no use of accessory musculature Abdominal: No organomegaly, abdomen is soft and non-tender, positive bowel sounds. No masses. Skin: No rashes. Neurologic: Facial musculature symmetric. Psychiatric: Patient acts appropriately throughout our interaction. Lymphatic: No cervical or submandibular lymphadenopathy Musculoskeletal: Gait intact. No edema, tenderness + cva tenderness   LABS: Results for orders placed or performed in visit on 06/01/15  POCT urinalysis dipstick  Result Value Ref Range   Color, UA yellow yellow   Clarity, UA hazy (A) clear   Glucose, UA negative negative   Bilirubin, UA negative negative   Ketones, POC UA negative negative   Spec Grav, UA 1.015    Blood, UA moderate (A) negative   pH, UA 6.0    Protein Ur, POC =30 (A) negative   Urobilinogen, UA 0.2    Nitrite, UA Negative Negative   Leukocytes, UA Trace (A) Negative  POCT Microscopic Urinalysis ([facility])  Result Value Ref Range   WBC,UR,HPF,POC Moderate (A) None WBC/hpf   RBC,UR,HPF,POC Moderate (A) None RBC/hpf   Bacteria Few (A) None   Mucus Absent Absent   Epithelial Cells, UR Per Microscopy Few (A)  None cells/hpf     EKG/XRAY:   Primary read interpreted by [facility]. Marin Comment at [facility].   ASSESSMENT/PLAN: Encounter Diagnoses  Name Primary?  . Painful urination   . Dysuria Yes  . UTI (urinary tract infection), bacterial   . Tobacco use disorder   . Flu vaccine need   . Tobacco abuse    Rx keflex, push fluids, try otc azo Rx Nicotine patches Urine cx pending  Gross sideeffects, risk and benefits, and alternatives of medications d/w patient. Patient is aware that all medications have potential sideeffects and we are unable to predict every sideeffect or drug-drug interaction that may occur.  Britteny Fiebelkorn DO  06/01/2015 8:22 PM

## 2015-06-04 LAB — URINE CULTURE: Colony Count: 15000

## 2015-06-14 ENCOUNTER — Telehealth: Payer: Self-pay

## 2015-06-14 NOTE — Telephone Encounter (Signed)
PA approved for Strattera through 06/13/16. Notified pharm. Pt can not take stimulants d/t HTN.

## 2015-06-22 ENCOUNTER — Other Ambulatory Visit: Payer: Self-pay | Admitting: Internal Medicine

## 2015-07-18 ENCOUNTER — Encounter: Payer: Self-pay | Admitting: Internal Medicine

## 2015-07-25 ENCOUNTER — Ambulatory Visit (INDEPENDENT_AMBULATORY_CARE_PROVIDER_SITE_OTHER): Payer: Medicare Other | Admitting: Emergency Medicine

## 2015-07-25 VITALS — BP 114/80 | HR 105 | Temp 97.8°F | Resp 18 | Ht 61.75 in | Wt 140.2 lb

## 2015-07-25 DIAGNOSIS — J0140 Acute pansinusitis, unspecified: Secondary | ICD-10-CM | POA: Diagnosis not present

## 2015-07-25 DIAGNOSIS — J209 Acute bronchitis, unspecified: Secondary | ICD-10-CM | POA: Diagnosis not present

## 2015-07-25 MED ORDER — AMOXICILLIN-POT CLAVULANATE 875-125 MG PO TABS: 1.0000 | ORAL_TABLET | Freq: Two times a day (BID) | ORAL | Status: DC

## 2015-07-25 MED ORDER — PSEUDOEPHEDRINE-GUAIFENESIN ER 60-600 MG PO TB12
1.0000 | ORAL_TABLET | Freq: Two times a day (BID) | ORAL | Status: DC
Start: 2015-07-25 — End: 2015-11-09

## 2015-07-25 MED ORDER — HYDROCOD POLST-CPM POLST ER 10-8 MG/5ML PO SUER: 5.0000 mL | Freq: Two times a day (BID) | ORAL | Status: DC

## 2015-07-25 NOTE — Patient Instructions (Signed)

## 2015-07-25 NOTE — Progress Notes (Signed)
Subjective:  Patient ID: Sheryl [name], female    DOB: 05/28/1951  Age: 64 y.o. 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 She has nasal congestion and postnasal drainage. Nasal discharge is brown in color. She has no fever or chills. She is rather fatigued at times. He has not has a cough productive of purulent sputum. No wheezing or shortness of breath was noted his nausea vomiting or stool change. No rash. No improvement with over-the-counter medication.  History Lue has a past medical history of Allergy; Arthritis; Osteoporosis; and COPD (chronic obstructive pulmonary disease) ([place]) (12/09/2014).   She has past surgical history that includes Joint replacement and Replacement total knee (2013).   Her  family history is not on file.  She   reports that she quit smoking about 8 months ago. Her smoking use included Cigarettes. She has a 24 pack-year smoking history. She does not have any smokeless tobacco history on file. She reports that she does not drink alcohol or use illicit drugs.  Outpatient Prescriptions Prior to Visit  Medication Sig Dispense Refill  . albuterol (PROVENTIL HFA;VENTOLIN HFA) 108 (90 BASE) MCG/ACT inhaler Inhale 1-2 puffs into the lungs every 6 (six) hours as needed for wheezing or shortness of breath. 1 Inhaler 3  . atomoxetine (STRATTERA) 100 MG capsule TAKE ONE CAPSULE EVERY DAY 90 capsule 3  . clobetasol cream (TEMOVATE) 0.05 % Apply topically 2 (two) times daily. 30 g 5  . Estradiol 10 MCG TABS vaginal tablet Use 2x/ weekly for vaginal atrophy 8 tablet 6  . FLUoxetine (PROZAC) 40 MG capsule Take 1 capsule (40 mg total) by mouth daily. 90 capsule 3  . fluticasone (FLONASE) 50 MCG/ACT nasal spray Place 2 sprays into both nostrils daily. 16 g 6  . ipratropium (ATROVENT) 0.03 % nasal spray Place 2 sprays into both nostrils 2 (two) times daily. 30 mL 0  . losartan-hydrochlorothiazide (HYZAAR) 100-12.5  MG tablet TAKE 1 TABLET BY MOUTH DAILY  "OFFICE VISIT NEEDED FOR REFILLS" 90 tablet 0  . meclizine (ANTIVERT) 25 MG tablet Take 0.5-1 tablets (12.5-25 mg total) by mouth 3 (three) times daily as needed for dizziness or nausea. 30 tablet 2  . meloxicam (MOBIC) 15 MG tablet TAKE 1 TABLET EVERY DAY 90 tablet 3  . nicotine (NICODERM CQ - DOSED IN MG/24 HOURS) 14 mg/24hr patch Place 1 patch (14 mg total) onto the skin daily. 28 patch 0  . rosuvastatin (CRESTOR) 10 MG tablet Take as directed for cholesterol 30 tablet 3  . tiotropium (SPIRIVA HANDIHALER) 18 MCG inhalation capsule Place 1 capsule (18 mcg total) into inhaler and inhale daily. 30 capsule 12  . traZODone (DESYREL) 50 MG tablet TAKE 1 TABLET AT BEDTIME 90 tablet 3  . cephALEXin (KEFLEX) 500 MG capsule Take 1 capsule (500 mg total) by mouth 2 (two) times daily. (Patient not taking: Reported on 07/25/2015) 10 capsule 0  . diclofenac (VOLTAREN) 75 MG EC tablet Take 1 tablet (75 mg total) by mouth 2 (two) times daily. (Patient not taking: Reported on 06/01/2015) 30 tablet 0   No facility-administered medications prior to visit.    Social History   Social History  . Marital Status: Divorced    Spouse Name: N/A  . Number of Children: N/A  . Years of Education: N/A   Social History Main Topics  . Smoking status: Former Smoker -- 0.50 packs/day for 48 years    Types: Cigarettes  Quit date: 11/04/2014  . Smokeless tobacco: None  . Alcohol Use: No  . Drug Use: No  . Sexual Activity: Yes   Other Topics Concern  . None   Social History Narrative     Review of Systems  Constitutional: Negative for fever, chills and appetite change.  HENT: Positive for congestion, postnasal drip, rhinorrhea, sinus pressure and sore throat. Negative for ear pain.   Eyes: Negative for pain and redness.  Respiratory: Positive for cough. Negative for shortness of breath and wheezing.   Cardiovascular: Negative for leg swelling.  Gastrointestinal:  Negative for nausea, vomiting, abdominal pain, diarrhea, constipation and blood in stool.  Endocrine: Negative for polyuria.  Genitourinary: Negative for dysuria, urgency, frequency and flank pain.  Musculoskeletal: Negative for gait problem.  Skin: Negative for rash.  Neurological: Negative for weakness and headaches.  Psychiatric/Behavioral: Negative for confusion and decreased concentration. The patient is not nervous/anxious.     Objective:  BP 114/80 mmHg  Pulse 105  Temp(Src) 97.8 F (36.6 C) (Oral)  Resp 18  Ht 5' 1.75" (1.568 m)  Wt 140 lb 3.2 oz (63.594 kg)  BMI 25.87 kg/m2  SpO2 95%  Physical Exam  Constitutional: She is oriented to person, place, and time. She appears well-developed and well-nourished. No distress.  HENT:  Head: Normocephalic and atraumatic.  Right Ear: External ear normal.  Left Ear: External ear normal.  Nose: Nose normal.  Eyes: Conjunctivae and EOM are normal. Pupils are equal, round, and reactive to light. No scleral icterus.  Neck: Normal range of motion. Neck supple. No tracheal deviation present.  Cardiovascular: Normal rate, regular rhythm and normal heart sounds.   Pulmonary/Chest: Effort normal. No respiratory distress. She has no wheezes. She has no rales.  Abdominal: She exhibits no mass. There is no tenderness. There is no rebound and no guarding.  Musculoskeletal: She exhibits no edema.  Lymphadenopathy:    She has no cervical adenopathy.  Neurological: She is alert and oriented to person, place, and time. Coordination normal.  Skin: Skin is warm and dry. No rash noted.  Psychiatric: She has a normal mood and affect. Her behavior is normal.      Assessment & Plan:   Lainee was seen today for sinusitis.  Diagnoses and all orders for this visit:  Acute bronchitis, unspecified organism  Acute pansinusitis, recurrence not specified  Other orders -     amoxicillin-clavulanate (AUGMENTIN) 875-125 MG tablet; Take 1 tablet by  mouth 2 (two) times daily. -     pseudoephedrine-guaifenesin (MUCINEX D) 60-600 MG 12 hr tablet; Take 1 tablet by mouth every 12 (twelve) hours. -     chlorpheniramine-HYDROcodone (TUSSIONEX PENNKINETIC ER) 10-8 MG/5ML SUER; Take 5 mLs by mouth 2 (two) times daily.  I am having Ms. Donna Christen start on amoxicillin-clavulanate, pseudoephedrine-guaifenesin, and chlorpheniramine-HYDROcodone. I am also having her maintain her atomoxetine, meloxicam, traZODone, FLUoxetine, fluticasone, meclizine, clobetasol cream, ipratropium, albuterol, tiotropium, diclofenac, rosuvastatin, Estradiol, nicotine, cephALEXin, and losartan-hydrochlorothiazide.  Meds ordered this encounter  Medications  . amoxicillin-clavulanate (AUGMENTIN) 875-125 MG tablet    Sig: Take 1 tablet by mouth 2 (two) times daily.    Dispense:  20 tablet    Refill:  0  . pseudoephedrine-guaifenesin (MUCINEX D) 60-600 MG 12 hr tablet    Sig: Take 1 tablet by mouth every 12 (twelve) hours.    Dispense:  18 tablet    Refill:  0  . chlorpheniramine-HYDROcodone (TUSSIONEX PENNKINETIC ER) 10-8 MG/5ML SUER    Sig: Take 5 mLs by  mouth 2 (two) times daily.    Dispense:  60 mL    Refill:  0    Appropriate red flag conditions were discussed with the patient as well as actions that should be taken.  Patient expressed his understanding.  Follow-up: Return if symptoms worsen or fail to improve.  Roselee Culver, MD

## 2015-08-06 ENCOUNTER — Other Ambulatory Visit: Payer: Self-pay | Admitting: Internal Medicine

## 2015-08-14 ENCOUNTER — Other Ambulatory Visit: Payer: Self-pay | Admitting: Internal Medicine

## 2015-08-24 ENCOUNTER — Ambulatory Visit (INDEPENDENT_AMBULATORY_CARE_PROVIDER_SITE_OTHER): Payer: Medicare Other

## 2015-08-24 ENCOUNTER — Ambulatory Visit (INDEPENDENT_AMBULATORY_CARE_PROVIDER_SITE_OTHER): Payer: Medicare Other | Admitting: Emergency Medicine

## 2015-08-24 VITALS — BP 122/72 | HR 92 | Temp 97.9°F | Resp 17 | Ht 60.5 in | Wt 139.0 lb

## 2015-08-24 DIAGNOSIS — M25512 Pain in left shoulder: Secondary | ICD-10-CM | POA: Diagnosis not present

## 2015-08-24 DIAGNOSIS — S46112A Strain of muscle, fascia and tendon of long head of biceps, left arm, initial encounter: Secondary | ICD-10-CM

## 2015-08-24 DIAGNOSIS — J209 Acute bronchitis, unspecified: Secondary | ICD-10-CM | POA: Diagnosis not present

## 2015-08-24 DIAGNOSIS — F17200 Nicotine dependence, unspecified, uncomplicated: Secondary | ICD-10-CM

## 2015-08-24 DIAGNOSIS — S46212A Strain of muscle, fascia and tendon of other parts of biceps, left arm, initial encounter: Secondary | ICD-10-CM

## 2015-08-24 DIAGNOSIS — R062 Wheezing: Secondary | ICD-10-CM

## 2015-08-24 LAB — POCT CBC
Granulocyte percent: 59.7 %G (ref 37–80)
HEMATOCRIT: 40.5 % (ref 37.7–47.9)
HEMOGLOBIN: 14.1 g/dL (ref 12.2–16.2)
LYMPH, POC: 2.5 (ref 0.6–3.4)
MCH: 31.5 pg — AB (ref 27–31.2)
MCHC: 34.9 g/dL (ref 31.8–35.4)
MCV: 90.3 fL (ref 80–97)
MID (cbc): 1.3 — AB (ref 0–0.9)
MPV: 6.9 fL (ref 0–99.8)
POC GRANULOCYTE: 5.5 (ref 2–6.9)
POC LYMPH PERCENT: 26.7 %L (ref 10–50)
POC MID %: 13.6 % — AB (ref 0–12)
Platelet Count, POC: 333 10*3/uL (ref 142–424)
RBC: 4.49 M/uL (ref 4.04–5.48)
RDW, POC: 13 %
WBC: 9.2 10*3/uL (ref 4.6–10.2)

## 2015-08-24 MED ORDER — ALBUTEROL SULFATE HFA 108 (90 BASE) MCG/ACT IN AERS: 1.0000 | INHALATION_SPRAY | Freq: Four times a day (QID) | RESPIRATORY_TRACT | Status: DC | PRN

## 2015-08-24 MED ORDER — BENZONATATE 100 MG PO CAPS: 100.0000 mg | ORAL_CAPSULE | Freq: Three times a day (TID) | ORAL | Status: DC | PRN

## 2015-08-24 MED ORDER — DOXYCYCLINE HYCLATE 100 MG PO TABS: 100.0000 mg | ORAL_TABLET | Freq: Two times a day (BID) | ORAL | Status: DC

## 2015-08-24 NOTE — Progress Notes (Addendum)
Patient ID: Sheryl [name], female   DOB: 06-29-1951, 64 y.o.   MRN: [id number]     By signing my name below, I, Zola Button, attest that this documentation has been prepared under the direction and in the presence of Arlyss Queen, MD.  Electronically Signed: Zola Button, Medical Scribe. 08/24/2015. 2:33 PM.   Chief Complaint:  Chief Complaint  Patient presents with  . Cough  . URI  . Nasal Congestion    HPI: Sheryl [name] is a 65 y.o. female with a history of COPD who reports to [facility] today complaining of gradual onset, productive cough that started about 6 weeks ago. Patient was seen here by Dr. Ouida Sills on 07/25/15 and was diagnosed with sinusitis. She notes that her symptoms had been improving, but worsened again recently. Patient reports having associated congestion and rhinorrhea with colored sputum. She does admit to smoking, but she is trying to quit. She is on Spiriva for COPD. She thinks her last CXR was about a year ago.  Patient works as a Brewing technologist. She reports having left arm pain which she believes is muscular. Patient states she has had this pain for a while, but this worsened yesterday after lifting some heavy pots. She has difficulty moving her left arm due to the pain.   Past Medical History  Diagnosis Date  . Allergy   . Arthritis   . Osteoporosis   . COPD (chronic obstructive pulmonary disease) ([place]) 12/09/2014   Past Surgical History  Procedure Laterality Date  . Joint replacement    . Replacement total knee  2013   Social History   Social History  . Marital Status: Divorced    Spouse Name: N/A  . Number of Children: N/A  . Years of Education: N/A   Social History Main Topics  . Smoking status: Former Smoker -- 0.50 packs/day for 49 years    Types: Cigarettes    Quit date: 11/04/2014  . Smokeless tobacco: None  . Alcohol Use: No  . Drug Use: No  . Sexual Activity: Yes   Other Topics Concern  . None   Social History Narrative   History reviewed. No  pertinent family history. Allergies  Allergen Reactions  . Macrobid [Nitrofurantoin Monohyd Macro]     GI upset   Prior to Admission medications   Medication Sig Start Date End Date Taking? Authorizing Provider  albuterol (PROVENTIL HFA;VENTOLIN HFA) 108 (90 BASE) MCG/ACT inhaler Inhale 1-2 puffs into the lungs every 6 (six) hours as needed for wheezing or shortness of breath. 11/16/14  Yes Tereasa Coop, PA-C  atomoxetine (STRATTERA) 100 MG capsule TAKE ONE CAPSULE EVERY DAY  "NO MORE REFILLS WITHOUT OFFICE VISIT" 08/16/15  Yes Leandrew Koyanagi, MD  cephALEXin (KEFLEX) 500 MG capsule Take 1 capsule (500 mg total) by mouth 2 (two) times daily. 06/01/15  Yes Thao P Le, DO  clobetasol cream (TEMOVATE) 0.05 % Apply topically 2 (two) times daily. 07/21/14  Yes Chelle Jeffery, PA-C  FLUoxetine (PROZAC) 40 MG capsule Take 1 capsule (40 mg total) by mouth daily. 07/16/14  Yes Leandrew Koyanagi, MD  fluticasone (FLONASE) 50 MCG/ACT nasal spray Place 2 sprays into both nostrils daily. 07/16/14  Yes Leandrew Koyanagi, MD  ipratropium (ATROVENT) 0.03 % nasal spray Place 2 sprays into both nostrils 2 (two) times daily. 11/02/14  Yes Bennett Scrape V, PA-C  losartan-hydrochlorothiazide (HYZAAR) 100-12.5 MG tablet TAKE 1 TABLET BY MOUTH DAILY  "OFFICE VISIT NEEDED FOR REFILLS" 06/22/15  Yes Leandrew Koyanagi, MD  meclizine (ANTIVERT) 25 MG tablet Take 0.5-1 tablets (12.5-25 mg total) by mouth 3 (three) times daily as needed for dizziness or nausea. 07/16/14  Yes Leandrew Koyanagi, MD  meloxicam (MOBIC) 15 MG tablet TAKE 1 TABLET EVERY DAY 07/16/14  Yes Leandrew Koyanagi, MD  pseudoephedrine-guaifenesin [facility] D) 60-600 MG 12 hr tablet Take 1 tablet by mouth every 12 (twelve) hours. 07/25/15 07/24/16 Yes Roselee Culver, MD  tiotropium (SPIRIVA HANDIHALER) 18 MCG inhalation capsule Place 1 capsule (18 mcg total) into inhaler and inhale daily. 12/09/14  Yes Gay Filler Copland, MD  traZODone (DESYREL) 50 MG  tablet TAKE 1 TABLET AT BEDTIME  "NO MORE REFILLS WITHOUT OFFICE VISIT" 08/07/15  Yes Leandrew Koyanagi, MD  chlorpheniramine-HYDROcodone [facility] ER) 10-8 MG/5ML SUER Take 5 mLs by mouth 2 (two) times daily. Patient not taking: Reported on 08/24/2015 07/25/15   Roselee Culver, MD  diclofenac (VOLTAREN) 75 MG EC tablet Take 1 tablet (75 mg total) by mouth 2 (two) times daily. Patient not taking: Reported on 06/01/2015 04/02/15   Posey Boyer, MD  Estradiol 10 MCG TABS vaginal tablet Use 2x/ weekly for vaginal atrophy Patient not taking: Reported on 08/24/2015 04/29/15   Gay Filler Copland, MD  nicotine (NICODERM CQ - DOSED IN MG/24 HOURS) 14 mg/24hr patch Place 1 patch (14 mg total) onto the skin daily. Patient not taking: Reported on 08/24/2015 06/01/15   Thao P Le, DO  rosuvastatin (CRESTOR) 10 MG tablet Take as directed for cholesterol Patient not taking: Reported on 08/24/2015 04/02/15   Posey Boyer, MD     ROS: The patient denies fevers, chills, night sweats, unintentional weight loss, chest pain, palpitations, wheezing, dyspnea on exertion, nausea, vomiting, abdominal pain, dysuria, hematuria, melena, numbness, weakness, or tingling.   All other systems have been reviewed and were otherwise negative with the exception of those mentioned in the HPI and as above.    PHYSICAL EXAM: Filed Vitals:   08/24/15 1408  BP: 122/72  Pulse: 92  Temp: 97.9 F (36.6 C)  Resp: 17   Body mass index is 26.69 kg/(m^2).   General: Alert, no acute distress HEENT:  Normocephalic, atraumatic, oropharynx patent. Nasal congestion. Throat normal. Eye: EOMI, [facility] Cardiovascular:  Regular rate and rhythm, no rubs murmurs or gallops.  No Carotid bruits, radial pulse intact. No pedal edema.  Respiratory: Bilateral breath sounds with occasional rhonchi. No wheezes. Abdominal: No organomegaly, abdomen is soft and non-tender, positive bowel sounds.  No masses. Musculoskeletal: She has what  appears to be a left mid bicep rupture with tenderness along the biceps. Skin: No rashes. Neurologic: Facial musculature symmetric. Psychiatric: Patient acts appropriately throughout our interaction. Lymphatic: No cervical or submandibular lymphadenopathy      LABS: Results for orders placed or performed in visit on 08/24/15  POCT CBC  Result Value Ref Range   WBC 9.2 4.6 - 10.2 K/uL   Lymph, poc 2.5 0.6 - 3.4   POC LYMPH PERCENT 26.7 10 - 50 %L   MID (cbc) 1.3 (A) 0 - 0.9   POC MID % 13.6 (A) 0 - 12 %M   POC Granulocyte 5.5 2 - 6.9   Granulocyte percent 59.7 37 - 80 %G   RBC 4.49 4.04 - 5.48 M/uL   Hemoglobin 14.1 12.2 - 16.2 g/dL   HCT, POC 40.5 37.7 - 47.9 %   MCV 90.3 80 - 97 fL   MCH, POC 31.5 (A) 27 - 31.2 pg   MCHC 34.9  31.8 - 35.4 g/dL   RDW, POC 13.0 %   Platelet Count, POC 333 142 - 424 K/uL   MPV 6.9 0 - 99.8 fL     EKG/XRAY:   Primary read interpreted by Dr. Everlene Farrier at [facility] pneumonia seen   ASSESSMENT/PLAN: White count normal chest x-ray without pneumonia we'll treat with doxycycline and Tessalon Perles.I personally performed the services described in this documentation, which was scribed in my presence. The recorded information has been reviewed and is accurate.referral made to Dr. Veverly Fells for evaluation of suspected left  Biceps rupture.I personally performed the services described in this documentation, which was scribed in my presence. The recorded information has been reviewed and is accurate.    Gross sideeffects, risk and benefits, and alternatives of medications d/w patient. Patient is aware that all medications have potential sideeffects and we are unable to predict every sideeffect or drug-drug interaction that may occur.  Arlyss Queen MD 08/24/2015 2:33 PM

## 2015-08-24 NOTE — Patient Instructions (Signed)
I have given you a cough medication and antibiotics for your bronchitis. I have made an appointment for your  left shoulder

## 2015-09-17 ENCOUNTER — Other Ambulatory Visit: Payer: Self-pay | Admitting: Internal Medicine

## 2015-09-25 ENCOUNTER — Other Ambulatory Visit: Payer: Self-pay | Admitting: Internal Medicine

## 2015-10-05 ENCOUNTER — Ambulatory Visit (INDEPENDENT_AMBULATORY_CARE_PROVIDER_SITE_OTHER): Payer: Medicare Other | Admitting: Family Medicine

## 2015-10-05 VITALS — BP 128/78 | HR 91 | Temp 98.1°F | Resp 18 | Ht 60.5 in | Wt 142.4 lb

## 2015-10-05 DIAGNOSIS — H66001 Acute suppurative otitis media without spontaneous rupture of ear drum, right ear: Secondary | ICD-10-CM

## 2015-10-05 DIAGNOSIS — H8111 Benign paroxysmal vertigo, right ear: Secondary | ICD-10-CM | POA: Diagnosis not present

## 2015-10-05 MED ORDER — AMOXICILLIN 875 MG PO TABS: 875.0000 mg | ORAL_TABLET | Freq: Two times a day (BID) | ORAL | Status: DC

## 2015-10-05 NOTE — Patient Instructions (Signed)
Great to meet you!  Finisih all of the antibiotics  Try the exercises, they can help vertigo a lot.   Vertigo Vertigo means you feel like you or your surroundings are moving when they are not. Vertigo can be dangerous if it occurs when you are at work, driving, or performing difficult activities.  CAUSES  Vertigo occurs when there is a conflict of signals sent to your brain from the visual and sensory systems in your body. There are many different causes of vertigo, including:  Infections, especially in the inner ear.  A bad reaction to a drug or misuse of alcohol and medicines.  Withdrawal from drugs or alcohol.  Rapidly changing positions, such as lying down or rolling over in bed.  A migraine headache.  Decreased blood flow to the brain.  Increased pressure in the brain from a head injury, infection, tumor, or bleeding. SYMPTOMS  You may feel as though the world is spinning around or you are falling to the ground. Because your balance is upset, vertigo can cause nausea and vomiting. You may have involuntary eye movements (nystagmus). DIAGNOSIS  Vertigo is usually diagnosed by physical exam. If the cause of your vertigo is unknown, your caregiver may perform imaging tests, such as an MRI scan (magnetic resonance imaging). TREATMENT  Most cases of vertigo resolve on their own, without treatment. Depending on the cause, your caregiver may prescribe certain medicines. If your vertigo is related to body position issues, your caregiver may recommend movements or procedures to correct the problem. In rare cases, if your vertigo is caused by certain inner ear problems, you may need surgery. HOME CARE INSTRUCTIONS   Follow your caregiver's instructions.  Avoid driving.  Avoid operating heavy machinery.  Avoid performing any tasks that would be dangerous to you or others during a vertigo episode.  Tell your caregiver if you notice that certain medicines seem to be causing your  vertigo. Some of the medicines used to treat vertigo episodes can actually make them worse in some people. SEEK IMMEDIATE MEDICAL CARE IF:   Your medicines do not relieve your vertigo or are making it worse.  You develop problems with talking, walking, weakness, or using your arms, hands, or legs.  You develop severe headaches.  Your nausea or vomiting continues or gets worse.  You develop visual changes.  A family member notices behavioral changes.  Your condition gets worse. MAKE SURE YOU:  Understand these instructions.  Will watch your condition.  Will get help right away if you are not doing well or get worse.   This information is not intended to replace advice given to you by your health care provider. Make sure you discuss any questions you have with your health care provider.   Document Released: 05/16/2005 Document Revised: 10/29/2011 Document Reviewed: 11/29/2014 Elsevier Interactive Patient Education [employer].

## 2015-10-05 NOTE — Progress Notes (Signed)
   HPI  Patient presents today here with dizzinesss and R ear pain  R ear pain X 2 weeks, Dizizness with room spinning sensation with Rightward head turn or up and down head aturn X 1-2 weeks off and on. Longstanding dizziness intermittently but worse over this time period  Denies fever, chills, dyspnea, cough, wekaness, numbness, or other concerns Started taking augmentin (she thinks) for 3 days and had improvement in Ear pain. But has not helped dizziness  No Hx of stroke   PMH: Smoking status noted ROS: Per HPI  Objective: BP 128/78 mmHg  Pulse 91  Temp(Src) 98.1 F (36.7 C) (Oral)  Resp 18  Ht 5' 0.5" (1.537 m)  Wt 142 lb 6.4 oz (64.592 kg)  BMI 27.34 kg/m2  SpO2 95% Gen: NAD, alert, cooperative with exam HEENT: NCAT, EOMI, PERRLA, R TMS with fluid/bubbles behiind TM with mild erthema, L TM WNL, nares clear, oropharynx clear CV: RRR, good S1/S2, no murmur Resp: CTABL, no wheezes, non-labored Abd: SNTND, BS present, no guarding or organomegaly Ext: No edema, warm Neuro: Alert and oriented, no [place], Strength 5/5 and sensation intact in All 4 extrems, CN 2-12 intact Reproduction of symptoms with epleys maneuver to R  Assessment and plan:  # Acute otitis media, BPPV likely exacerbated by effusion Finish Tx with amox, finish all pills discussed and taught Epley's maneuver- symptoms improved on R Handout given for Epley's RTC if worsening or not improving as expected   Meds ordered this encounter  Medications  . amoxicillin (AMOXIL) 875 MG tablet    Sig: Take 1 tablet (875 mg total) by mouth 2 (two) times daily.    Dispense:  10 tablet    Refill:  0    Laroy Apple, MD [facility] 10/05/2015, 11:30 AM

## 2015-10-17 ENCOUNTER — Other Ambulatory Visit: Payer: Self-pay | Admitting: Internal Medicine

## 2015-11-09 ENCOUNTER — Ambulatory Visit (INDEPENDENT_AMBULATORY_CARE_PROVIDER_SITE_OTHER): Payer: Medicare Other | Admitting: Internal Medicine

## 2015-11-09 ENCOUNTER — Encounter: Payer: Self-pay | Admitting: Internal Medicine

## 2015-11-09 VITALS — BP 119/81 | HR 102 | Temp 97.4°F | Resp 16 | Ht 60.5 in | Wt 141.0 lb

## 2015-11-09 DIAGNOSIS — J449 Chronic obstructive pulmonary disease, unspecified: Secondary | ICD-10-CM | POA: Diagnosis not present

## 2015-11-09 DIAGNOSIS — Z0000 Encounter for general adult medical examination without abnormal findings: Secondary | ICD-10-CM

## 2015-11-09 DIAGNOSIS — M7582 Other shoulder lesions, left shoulder: Secondary | ICD-10-CM | POA: Diagnosis not present

## 2015-11-09 DIAGNOSIS — E785 Hyperlipidemia, unspecified: Secondary | ICD-10-CM

## 2015-11-09 DIAGNOSIS — G8929 Other chronic pain: Secondary | ICD-10-CM

## 2015-11-09 DIAGNOSIS — R888 Abnormal findings in other body fluids and substances: Secondary | ICD-10-CM

## 2015-11-09 DIAGNOSIS — F329 Major depressive disorder, single episode, unspecified: Secondary | ICD-10-CM | POA: Diagnosis not present

## 2015-11-09 DIAGNOSIS — F909 Attention-deficit hyperactivity disorder, unspecified type: Secondary | ICD-10-CM | POA: Diagnosis not present

## 2015-11-09 DIAGNOSIS — J441 Chronic obstructive pulmonary disease with (acute) exacerbation: Secondary | ICD-10-CM

## 2015-11-09 DIAGNOSIS — IMO0002 Reserved for concepts with insufficient information to code with codable children: Secondary | ICD-10-CM

## 2015-11-09 DIAGNOSIS — B977 Papillomavirus as the cause of diseases classified elsewhere: Secondary | ICD-10-CM

## 2015-11-09 DIAGNOSIS — J209 Acute bronchitis, unspecified: Secondary | ICD-10-CM

## 2015-11-09 DIAGNOSIS — F988 Other specified behavioral and emotional disorders with onset usually occurring in childhood and adolescence: Secondary | ICD-10-CM

## 2015-11-09 DIAGNOSIS — M545 Low back pain, unspecified: Secondary | ICD-10-CM

## 2015-11-09 DIAGNOSIS — F17210 Nicotine dependence, cigarettes, uncomplicated: Secondary | ICD-10-CM

## 2015-11-09 DIAGNOSIS — N898 Other specified noninflammatory disorders of vagina: Secondary | ICD-10-CM | POA: Diagnosis not present

## 2015-11-09 DIAGNOSIS — Z720 Tobacco use: Secondary | ICD-10-CM

## 2015-11-09 DIAGNOSIS — R062 Wheezing: Secondary | ICD-10-CM

## 2015-11-09 DIAGNOSIS — S46912A Strain of unspecified muscle, fascia and tendon at shoulder and upper arm level, left arm, initial encounter: Secondary | ICD-10-CM

## 2015-11-09 DIAGNOSIS — F32A Depression, unspecified: Secondary | ICD-10-CM

## 2015-11-09 DIAGNOSIS — R87612 Low grade squamous intraepithelial lesion on cytologic smear of cervix (LGSIL): Secondary | ICD-10-CM

## 2015-11-09 DIAGNOSIS — L309 Dermatitis, unspecified: Secondary | ICD-10-CM

## 2015-11-09 DIAGNOSIS — Z124 Encounter for screening for malignant neoplasm of cervix: Secondary | ICD-10-CM

## 2015-11-09 DIAGNOSIS — R0981 Nasal congestion: Secondary | ICD-10-CM

## 2015-11-09 DIAGNOSIS — Z23 Encounter for immunization: Secondary | ICD-10-CM | POA: Diagnosis not present

## 2015-11-09 DIAGNOSIS — I10 Essential (primary) hypertension: Secondary | ICD-10-CM | POA: Diagnosis not present

## 2015-11-09 LAB — CBC WITH DIFFERENTIAL/PLATELET
BASOS PCT: 1 % (ref 0–1)
Basophils Absolute: 0.1 10*3/uL (ref 0.0–0.1)
EOS ABS: 0.8 10*3/uL — AB (ref 0.0–0.7)
Eosinophils Relative: 11 % — ABNORMAL HIGH (ref 0–5)
HCT: 39.1 % (ref 36.0–46.0)
HEMOGLOBIN: 13 g/dL (ref 12.0–15.0)
Lymphocytes Relative: 35 % (ref 12–46)
Lymphs Abs: 2.6 10*3/uL (ref 0.7–4.0)
MCH: 31.1 pg (ref 26.0–34.0)
MCHC: 33.2 g/dL (ref 30.0–36.0)
MCV: 93.5 fL (ref 78.0–100.0)
MONO ABS: 0.4 10*3/uL (ref 0.1–1.0)
MONOS PCT: 6 % (ref 3–12)
MPV: 10.8 fL (ref 8.6–12.4)
NEUTROS ABS: 3.4 10*3/uL (ref 1.7–7.7)
Neutrophils Relative %: 47 % (ref 43–77)
PLATELETS: 406 10*3/uL — AB (ref 150–400)
RBC: 4.18 MIL/uL (ref 3.87–5.11)
RDW: 13.6 % (ref 11.5–15.5)
WBC: 7.3 10*3/uL (ref 4.0–10.5)

## 2015-11-09 LAB — LIPID PANEL
Cholesterol: 203 mg/dL — ABNORMAL HIGH (ref 125–200)
HDL: 54 mg/dL (ref >=46)
LDL CALC: 118 mg/dL (ref <130)
TRIGLYCERIDES: 156 mg/dL — AB (ref <150)
Total CHOL/HDL Ratio: 3.8 Ratio (ref <=5.0)
VLDL: 31 mg/dL — AB (ref <30)

## 2015-11-09 LAB — COMPREHENSIVE METABOLIC PANEL
ALBUMIN: 4.4 g/dL (ref 3.6–5.1)
ALT: 30 U/L — ABNORMAL HIGH (ref 6–29)
AST: 28 U/L (ref 10–35)
Alkaline Phosphatase: 98 U/L (ref 33–130)
BUN: 19 mg/dL (ref 7–25)
CHLORIDE: 102 mmol/L (ref 98–110)
CO2: 25 mmol/L (ref 20–31)
CREATININE: 1.03 mg/dL — AB (ref 0.50–0.99)
Calcium: 9.8 mg/dL (ref 8.6–10.4)
GLUCOSE: 90 mg/dL (ref 65–99)
Potassium: 4.9 mmol/L (ref 3.5–5.3)
SODIUM: 139 mmol/L (ref 135–146)
Total Bilirubin: 0.4 mg/dL (ref 0.2–1.2)
Total Protein: 7.8 g/dL (ref 6.1–8.1)

## 2015-11-09 MED ORDER — ZOSTER VACCINE LIVE 19400 UNT/0.65ML ~~LOC~~ SOLR: 0.6500 mL | Freq: Once | SUBCUTANEOUS | Status: DC

## 2015-11-10 LAB — PAP IG AND HPV HIGH-RISK: HPV DNA High Risk: NOT DETECTED

## 2015-11-11 MED ORDER — CLOBETASOL PROPIONATE 0.05 % EX CREA: TOPICAL_CREAM | Freq: Two times a day (BID) | CUTANEOUS | Status: DC

## 2015-11-11 MED ORDER — FLUOXETINE HCL 40 MG PO CAPS: 40.0000 mg | ORAL_CAPSULE | Freq: Every day | ORAL | Status: DC

## 2015-11-11 MED ORDER — ATOMOXETINE HCL 100 MG PO CAPS: ORAL_CAPSULE | ORAL | Status: DC

## 2015-11-11 MED ORDER — IPRATROPIUM BROMIDE 0.03 % NA SOLN: 2.0000 | Freq: Two times a day (BID) | NASAL | Status: DC

## 2015-11-11 MED ORDER — TIOTROPIUM BROMIDE MONOHYDRATE 18 MCG IN CAPS: 18.0000 ug | ORAL_CAPSULE | Freq: Every day | RESPIRATORY_TRACT | Status: DC

## 2015-11-11 MED ORDER — ESTRADIOL 10 MCG VA TABS: ORAL_TABLET | VAGINAL | Status: DC

## 2015-11-11 MED ORDER — MELOXICAM 15 MG PO TABS: ORAL_TABLET | ORAL | Status: DC

## 2015-11-11 MED ORDER — FLUTICASONE PROPIONATE 50 MCG/ACT NA SUSP: 2.0000 | Freq: Every day | NASAL | Status: DC

## 2015-11-11 MED ORDER — DICLOFENAC SODIUM 75 MG PO TBEC: 75.0000 mg | DELAYED_RELEASE_TABLET | Freq: Two times a day (BID) | ORAL | Status: DC

## 2015-11-11 MED ORDER — TRAZODONE HCL 50 MG PO TABS: ORAL_TABLET | ORAL | Status: DC

## 2015-11-11 MED ORDER — ALBUTEROL SULFATE HFA 108 (90 BASE) MCG/ACT IN AERS: 1.0000 | INHALATION_SPRAY | Freq: Four times a day (QID) | RESPIRATORY_TRACT | Status: DC | PRN

## 2015-11-11 MED ORDER — ROSUVASTATIN CALCIUM 10 MG PO TABS: ORAL_TABLET | ORAL | Status: DC

## 2015-11-11 MED ORDER — LOSARTAN POTASSIUM-HCTZ 100-12.5 MG PO TABS: 1.0000 | ORAL_TABLET | Freq: Every day | ORAL | Status: DC

## 2015-11-11 MED ORDER — NICOTINE 14 MG/24HR TD PT24: 14.0000 mg | MEDICATED_PATCH | Freq: Every day | TRANSDERMAL | Status: DC

## 2015-11-11 NOTE — Progress Notes (Addendum)
Subjective:    Patient ID: Sheryl [name], female    DOB: Jan 22, 1951, 65 y.o.   MRN: [id number]  HPI annual exam/initial medicare exam/follow-up of problems  Patient Active Problem List   Diagnosis Date Noted  . COPD (chronic obstructive pulmonary disease) ([place]) 12/09/2014  . Lumbago 07/16/2014  . Nicotine addiction 06/06/2012  . HTN (hypertension) 06/04/2012  . Hyperlipidemia 06/04/2012  . Depression 06/04/2012  . ADD (attention deficit disorder) 06/04/2012  . AR (allergic rhinitis) 06/04/2012   Outpatient Encounter Prescriptions as of 11/09/2015  Medication Sig  . albuterol (PROVENTIL HFA;VENTOLIN HFA) 108 (90 Base) MCG/ACT inhaler Inhale 1-2 puffs into the lungs every 6 (six) hours as needed for wheezing or shortness of breath.  Marland Kitchen atomoxetine (STRATTERA) 100 MG capsule TAKE ONE CAPSULE EVERY DAY  "NO MORE REFILLS WITHOUT OFFICE VISIT"  . benzonatate (TESSALON) 100 MG capsule Take 1-2 capsules (100-200 mg total) by mouth 3 (three) times daily as needed for cough.  . clobetasol cream (TEMOVATE) 0.05 % Apply topically 2 (two) times daily.  . diclofenac (VOLTAREN) 75 MG EC tablet Take 1 tablet (75 mg total) by mouth 2 (two) times daily.  . Estradiol 10 MCG TABS vaginal tablet Use 2x/ weekly for vaginal atrophy  . FLUoxetine (PROZAC) 40 MG capsule TAKE 1 CAPSULE (40 MG TOTAL) BY MOUTH DAILY.  . fluticasone (FLONASE) 50 MCG/ACT nasal spray Place 2 sprays into both nostrils daily.  Marland Kitchen ipratropium (ATROVENT) 0.03 % nasal spray Place 2 sprays into both nostrils 2 (two) times daily.  Marland Kitchen losartan-hydrochlorothiazide (HYZAAR) 100-12.5 MG tablet TAKE 1 TABLET BY MOUTH DAILY "OFFICE VISIT NEEDED FOR REFILLS"  . meclizine (ANTIVERT) 25 MG tablet Take 0.5-1 tablets (12.5-25 mg total) by mouth 3 (three) times daily as needed for dizziness or nausea.  . meloxicam (MOBIC) 15 MG tablet TAKE 1 TABLET EVERY DAY  . rosuvastatin (CRESTOR) 10 MG tablet Take as directed for cholesterol  . tiotropium (SPIRIVA  HANDIHALER) 18 MCG inhalation capsule Place 1 capsule (18 mcg total) into inhaler and inhale daily.  . traZODone (DESYREL) 50 MG tablet TAKE 1 TABLET AT BEDTIME  "NO MORE REFILLS WITHOUT OFFICE VISIT"   No facility-administered encounter medications on file as of 11/09/2015.   She resumed her cholesterol medicine 2 months ago Health maintenance issues include the need for shingles vaccine, colonoscopy, DEXA scan, mammogram, Pneumovax, and Pap smear. Her last Pap smear was normal except for positive HPV and she failed follow-up  Depression and anxiety stable No risk for falls Active and interested in life Needs living will  Retired Pharmacist, hospital Review of Systems 14 point review of systems performed positive for occasional ear pain with tinnitus, some shortness of breath with activity and wheezing, several areas of joint pain including back in general muscles and neck stiffness, seasonal allergies, and sleep disturbance from over thinking. Unfortunately she continues to smoke as not been able to quit for very long periods of time. Recently stopped and restarted She also has noted some left arm pain extending from shoulder to elbow as she does her pottery work with a Education officer, community. She has had some recent dizziness thought related to ear pain on the right slowly improving treatment meclizine    Objective:   Physical Exam  Constitutional: She is oriented to person, place, and time. She appears well-developed and well-nourished. No distress.  HENT:  Head: Normocephalic.  Right Ear: External ear normal.  Left Ear: External ear normal.  Nose: Nose normal.  Mouth/Throat: Oropharynx is clear and moist.  TMs are clear. No definite hearing loss grossly. No cause for vertigo found.  Eyes: Conjunctivae and EOM are normal. Pupils are equal, round, and reactive to light.  Neck: Normal range of motion. Neck supple. No thyromegaly present.  Cardiovascular: Normal rate, regular rhythm, normal heart sounds  and intact distal pulses.   No murmur heard. Pulmonary/Chest: Effort normal and breath sounds normal. She has no wheezes.  Breast exam normal  Abdominal: Soft. Bowel sounds are normal. She exhibits no distension and no mass. There is no tenderness. There is no rebound.  Genitourinary:  Introitus clear. Os clear. Uterus midposition nontender. No adnexal masses or tenderness.  Musculoskeletal: Normal range of motion. She exhibits no edema or tenderness.  She has symptoms of left epicondylitis  Lymphadenopathy:    She has no cervical adenopathy.  Neurological: She is alert and oriented to person, place, and time. She has normal reflexes. No cranial nerve deficit. Coordination normal.  No delusions or grandiosity. Thought content clear  Skin: Skin is warm and dry.  Psychiatric: She has a normal mood and affect. Her behavior is normal. Judgment and thought content normal.  Nursing note and vitals reviewed.      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 - Plan: Pap IG and HPV (high risk) DNA detection --May need colposcopy  Wheezing - Plan: albuterol (PROVENTIL HFA;VENTOLIN HFA) 108 (90 Base) MCG/ACT inhaler  COPD exacerbation ([facility]) - Plan: tiotropium (SPIRIVA HANDIHALER) 18 MCG inhalation capsule///must quit smoking  Eczema - Plan: clobetasol cream (TEMOVATE) 0.05 %  Nasal congestion - Plan: ipratropium (ATROVENT) 0.03 % nasal spray  Tobacco abuse - Plan: nicotine  (NICODERM CQ - DOSED IN MG/24 HOURS) 14 mg/24hr patch  She should receive reminder about colonoscopy for this year  Vaginal dryness - Plan: Estradiol 10 MCG TABS vaginal tablet  Pneumovax 23/Zostavax  Left elbow pain-epicondylitis--physical therapy instructions given to allow her to continue as [place]//follow-up one month if not well  We discussed living will and she has given her copy to fill out discuss power of attorney with her lawyer and family  Meds ordered this encounter  Medications  . zoster vaccine live, PF, (ZOSTAVAX) 16109 UNT/0.65ML injection    Sig: Inject 19,400 Units into the skin once. Administer at pharmacy    Dispense:  1 each    Refill:  0  . albuterol (PROVENTIL HFA;VENTOLIN HFA) 108 (90 Base) MCG/ACT inhaler    Sig: Inhale 1-2 puffs into the lungs every 6 (six) hours as needed for wheezing or shortness of breath.    Dispense:  1 Inhaler    Refill:  3  . tiotropium (SPIRIVA HANDIHALER) 18 MCG inhalation capsule    Sig: Place 1 capsule (18 mcg total) into inhaler and inhale daily.    Dispense:  30 capsule    Refill:  12  . clobetasol cream (TEMOVATE) 0.05 %    Sig: Apply topically 2 (two) times daily.    Dispense:  30 g    Refill:  5  . rosuvastatin (CRESTOR) 10 MG tablet    Sig: Take as directed for cholesterol  Dispense:  30 tablet    Refill:  3  . ipratropium (ATROVENT) 0.03 % nasal spray    Sig: Place 2 sprays into both nostrils 2 (two) times daily.    Dispense:  30 mL    Refill:  0  . diclofenac (VOLTAREN) 75 MG EC tablet    Sig: Take 1 tablet (75 mg total) by mouth 2 (two) times daily.    Dispense:  30 tablet    Refill:  0  . nicotine (NICODERM CQ - DOSED IN MG/24 HOURS) 14 mg/24hr patch    Sig: Place 1 patch (14 mg total) onto the skin daily.    Dispense:  28 patch    Refill:  0  . Estradiol 10 MCG TABS vaginal tablet    Sig: Use 2x/ weekly for vaginal atrophy    Dispense:  8 tablet    Refill:  6  . traZODone (DESYREL) 50 MG tablet     Sig: TAKE 1 TABLET AT BEDTIME  "NO MORE REFILLS WITHOUT OFFICE VISIT"    Dispense:  90 tablet    Refill:  0  . meloxicam (MOBIC) 15 MG tablet    Sig: TAKE 1 TABLET EVERY DAY    Dispense:  90 tablet    Refill:  3  . fluticasone (FLONASE) 50 MCG/ACT nasal spray    Sig: Place 2 sprays into both nostrils daily.    Dispense:  16 g    Refill:  6  . FLUoxetine (PROZAC) 40 MG capsule    Sig: Take 1 capsule (40 mg total) by mouth daily.    Dispense:  90 capsule    Refill:  3  . atomoxetine (STRATTERA) 100 MG capsule    Sig: TAKE ONE CAPSULE EVERY DAY  "NO MORE REFILLS WITHOUT OFFICE VISIT"    Dispense:  90 capsule    Refill:  0  . losartan-hydrochlorothiazide (HYZAAR) 100-12.5 MG tablet    Sig: Take 1 tablet by mouth daily.    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

## 2015-11-12 ENCOUNTER — Telehealth: Payer: Self-pay | Admitting: *Deleted

## 2015-11-12 NOTE — Telephone Encounter (Signed)
Spoke with Sheryl [name] pharmacist , patient was told to take voltaren or mobic she may choose which one.  Per Sheryl [name]

## 2015-11-13 NOTE — Addendum Note (Signed)
Addended by: Leandrew Koyanagi on: 11/13/2015 11:54 AM   Modules accepted: Orders

## 2015-12-08 ENCOUNTER — Encounter: Payer: Self-pay | Admitting: Gynecology

## 2015-12-08 ENCOUNTER — Ambulatory Visit (INDEPENDENT_AMBULATORY_CARE_PROVIDER_SITE_OTHER): Payer: Medicare Other | Admitting: Gynecology

## 2015-12-08 VITALS — BP 124/80 | Ht 60.0 in | Wt 141.0 lb

## 2015-12-08 DIAGNOSIS — R896 Abnormal cytological findings in specimens from other organs, systems and tissues: Secondary | ICD-10-CM | POA: Diagnosis not present

## 2015-12-08 DIAGNOSIS — IMO0002 Reserved for concepts with insufficient information to code with codable children: Secondary | ICD-10-CM

## 2015-12-08 NOTE — Progress Notes (Signed)
   The patient is a 65 year old was referred to our practice as a courtesy of Dr. 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 Review of her record indicated that back in 2013 she had a negative Pap smear but positive for HPV. Patient stated many years ago she had some form of dysplasia and had biopsy and follow-up but no treatment had been given. Patient not sexually active currently on Vagifem 10 g twice a week for vaginal atrophy. Patient was counseled to undergo a detail colposcopic evaluation as follows:  The external genitalia was inspected colposcopically as well as the perineum and perirectal region and lesions were seen. Suspect was introduced into the vagina. A systematic inspection of the vagina demonstrated atrophic mucosa acetic acid was applied no lesions were noted on the fornix scattered small flat acetowhite areas were noted around the 12:00 area. The transformation sewn was not visualized due to the fact that she has a very stenotic os making it impossible to obtain an ECC.  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 As described above the 12:00 flat acetowhite areas were biopsied. ECC unable to be obtained. If the pathology report is benign we will need to schedule the patient for a LEEP procedure here in the office to better assess the cervical canal. Suture information was provided. Monsel solution was applied for hemostasis.

## 2015-12-08 NOTE — Patient Instructions (Signed)
Colposcopa - Cuidados posteriores (Colposcopy, Care After) Siga estas instrucciones durante las prximas semanas. Estas indicaciones le proporcionan informacin general acerca de cmo deber cuidarse despus del procedimiento. El mdico tambin podr darle instrucciones ms especficas. El tratamiento se ha planificado de acuerdo a las prcticas mdicas actuales, pero a veces se producen problemas. Comunquese con el mdico si tiene algn problema o tiene dudas despus del procedimiento. QU ESPERAR DESPUS DEL PROCEDIMIENTO  Despus del procedimiento, es tpico tener las siguientes sensaciones:  Clicos. Generalmente se calman en algunos minutos.  Dolor. [place].  Aturdimiento. Si esto le ocurre, recustese durante algunos minutos. Podr tener un sangrado leve o una secrecin oscura que debe detenerse en [place]. Durante este tiempo deber usar un apsito sanitario. [place] vaginales y el uso de tampones durante 3 das, o segn lo que le indique su mdico.  Tome slo medicamentos de venta libre o recetados, segn las indicaciones del mdico. No tome aspirina, ya que puede causar hemorragias.  Si utiliza pldoras anticonceptivas, contine tomndolas.  No todos los resultados estarn disponibles durante su visita. En este caso, tenga otra entrevista con su mdico para conocerlos. No suponga que es normal si no tiene noticias de su mdico o del establecimiento de salud. Es Building services engineer seguimiento de todos los [place].  Siga los consejos de su mdico con respecto a los [place], [place], visitas y [place] de control. SOLICITE ATENCIN MDICA SI:  Aparece una erupcin cutnea.  Tiene problemas con los medicamentos. SOLICITE ATENCIN MDICA DE INMEDIATO SI:  Tiene una hemorragia abundante o elimina cogulos.  Tiene fiebre.  Tiene flujo vaginal  anormal.  Tiene clicos que no se alivian luego de tomar analgsicos.  Se siente mareada, tiene vahdos o se desmaya.  Siente Research scientist (life sciences).   Esta informacin no tiene Marine scientist el consejo del mdico. Asegrese de hacerle al mdico cualquier pregunta que tenga.   Document Released: 05/27/2013 Elsevier Interactive Patient Education [employer].

## 2015-12-12 LAB — PATHOLOGY

## 2016-01-23 ENCOUNTER — Ambulatory Visit (INDEPENDENT_AMBULATORY_CARE_PROVIDER_SITE_OTHER): Payer: Medicare Other | Admitting: Family Medicine

## 2016-01-23 VITALS — BP 138/80 | HR 112 | Temp 98.2°F | Resp 16 | Ht 60.0 in | Wt 138.0 lb

## 2016-01-23 DIAGNOSIS — R05 Cough: Secondary | ICD-10-CM

## 2016-01-23 DIAGNOSIS — J0101 Acute recurrent maxillary sinusitis: Secondary | ICD-10-CM

## 2016-01-23 DIAGNOSIS — R059 Cough, unspecified: Secondary | ICD-10-CM

## 2016-01-23 DIAGNOSIS — J441 Chronic obstructive pulmonary disease with (acute) exacerbation: Secondary | ICD-10-CM

## 2016-01-23 MED ORDER — AMOXICILLIN-POT CLAVULANATE 875-125 MG PO TABS: 1.0000 | ORAL_TABLET | Freq: Two times a day (BID) | ORAL | Status: DC

## 2016-01-23 MED ORDER — FLUTICASONE PROPIONATE 50 MCG/ACT NA SUSP: 2.0000 | Freq: Every day | NASAL | Status: DC

## 2016-01-23 MED ORDER — HYDROCODONE-HOMATROPINE 5-1.5 MG/5ML PO SYRP: 5.0000 mL | ORAL_SOLUTION | Freq: Four times a day (QID) | ORAL | Status: DC | PRN

## 2016-01-23 NOTE — Progress Notes (Signed)
Subjective:    Patient ID: Sheryl [name], female    DOB: 09-24-1950, 65 y.o.   MRN: [id number]  01/23/2016  Cough; Sore Throat; and Sinusitis   HPI This 65 y.o. female presents for evaluation of sore throat, cough, vertigo.  Onset one week ago. Does not feel well.  No fever/chill/sweats.  Mild headache.  No ear pain.  +ST constant for week. Went to [employer]; over did it.  Hoarseness/laryngitis.  Trying to quit smoking.  No rhinorrhea; +nasal congestion; drainage is yellowish. +coughing; +Sputum production yellowish.  Harsh cough.  +SOB.  Able to walk normally; trying to drag cart with umbrella at beach was difficult.    Wheezing not sure.  Not using Spiriva.  Not using Atrovent nasal sprays.  Not using Flonase.  Taking allergy pill daily OTC.  Using Albuterol HFA twice this week.  No v/d.  No other medication.     Review of Systems  Constitutional: Negative for fever, chills, diaphoresis and fatigue.  HENT: Positive for congestion, sore throat and voice change. Negative for ear pain and rhinorrhea.   Eyes: Negative for visual disturbance.  Respiratory: Positive for cough and shortness of breath.   Cardiovascular: Negative for chest pain, palpitations and leg swelling.  Gastrointestinal: Negative for nausea, vomiting, abdominal pain, diarrhea and constipation.  Endocrine: Negative for cold intolerance, heat intolerance, polydipsia, polyphagia and polyuria.  Neurological: Positive for dizziness and headaches. Negative for tremors, seizures, syncope, facial asymmetry, speech difficulty, weakness, light-headedness and numbness.    Past Medical History  Diagnosis Date  . Allergy   . Arthritis   . Osteoporosis   . COPD (chronic obstructive pulmonary disease) ([place]) 12/09/2014   Past Surgical History  Procedure Laterality Date  . Joint replacement    . Replacement total knee  2013   Allergies  Allergen Reactions  . Macrobid [Nitrofurantoin Monohyd Macro]     GI upset    Social  History   Social History  . Marital Status: Divorced    Spouse Name: N/A  . Number of Children: N/A  . Years of Education: N/A   Occupational History  . Not on file.   Social History Main Topics  . Smoking status: Current Every Day Smoker -- 0.50 packs/day for 49 years    Types: Cigarettes    Start date: 12/08/2015  . Smokeless tobacco: Not on file  . Alcohol Use: No  . Drug Use: No  . Sexual Activity: No   Other Topics Concern  . Not on file   Social History Narrative   History reviewed. No pertinent family history.     Objective:    BP 138/80 mmHg  Pulse 112  Temp(Src) 98.2 F (36.8 C) (Oral)  Resp 16  Ht 5' (1.524 m)  Wt 138 lb (62.596 kg)  BMI 26.95 kg/m2  SpO2 98% Physical Exam  Constitutional: She is oriented to person, place, and time. She appears well-developed and well-nourished. No distress.  HENT:  Head: Normocephalic and atraumatic.  Right Ear: Tympanic membrane, external ear and ear canal normal.  Left Ear: Tympanic membrane, external ear and ear canal normal.  Nose: Nose normal.  Mouth/Throat: Uvula is midline and mucous membranes are normal. Posterior oropharyngeal erythema present.  Eyes: Conjunctivae and EOM are normal. Pupils are equal, round, and reactive to light.  Neck: Normal range of motion. Neck supple. Carotid bruit is not present. No thyromegaly present.  Cardiovascular: Normal rate, regular rhythm, normal heart sounds and intact distal pulses.  Exam reveals no  gallop and no friction rub.   No murmur heard. Pulmonary/Chest: Effort normal. She has wheezes. She has no rales.  Abdominal: Soft. Bowel sounds are normal. She exhibits no distension and no mass. There is no tenderness. There is no rebound and no guarding.  Lymphadenopathy:    She has no cervical adenopathy.  Neurological: She is alert and oriented to person, place, and time. No cranial nerve deficit.  Skin: Skin is warm and dry. No rash noted. She is not diaphoretic. No  erythema. No pallor.  Psychiatric: She has a normal mood and affect. Her behavior is normal.   Results for orders placed or performed in visit on 12/08/15  Pathology Report  Result Value Ref Range   Report         Assessment & Plan:   1. Acute recurrent maxillary sinusitis   2. Cough   3. COPD exacerbation ([place])     No orders of the defined types were placed in this encounter.   Meds ordered this encounter  Medications  . DISCONTD: amoxicillin-clavulanate (AUGMENTIN) 875-125 MG tablet    Sig: Take 1 tablet by mouth 2 (two) times daily.    Dispense:  20 tablet    Refill:  0  . DISCONTD: HYDROcodone-homatropine (HYCODAN) 5-1.5 MG/5ML syrup    Sig: Take 5 mLs by mouth every 6 (six) hours as needed for cough.    Dispense:  180 mL    Refill:  0  . fluticasone (FLONASE) 50 MCG/ACT nasal spray    Sig: Place 2 sprays into both nostrils daily.    Dispense:  16 g    Refill:  6    No Follow-up on file.    Horst Ostermiller Elayne Guerin, M.D. [facility] [address] [place], [place]  [address] [phone] phone [phone] fax

## 2016-01-23 NOTE — Patient Instructions (Addendum)
1.  Spiriva --- inhale daily for the next two weeks. 2.  Albuterol inhaler --- 2 puffs three times daily for one week and then as needed. 3.  Atrovent nasal spray ---- 2 sprays into each nostril twice dialy. 4.  Flonase --- 2 sprays into each nostril daily.       IF you received an x-ray today, you will receive an invoice from [facility]. Please contact  [facility] at [phone] with questions or concerns regarding your invoice.   IF you received labwork today, you will receive an invoice from [employer]. Please contact [facility] at [phone] with questions or concerns regarding your invoice.   Our billing staff will not be able to assist you with questions regarding bills from these companies.  You will be contacted with the lab results as soon as they are available. The fastest way to get your results is to activate your My Chart account. Instructions are located on the last page of this paperwork. If you have not heard from [place] regarding the results in 2 weeks, please contact this office.     Acute Bronchitis Bronchitis is inflammation of the airways that extend from the windpipe into the lungs (bronchi). The inflammation often causes mucus to develop. This leads to a cough, which is the most common symptom of bronchitis.  In acute bronchitis, the condition usually develops suddenly and goes away over time, usually in a couple weeks. Smoking, allergies, and asthma can make bronchitis worse. Repeated episodes of bronchitis may cause further lung problems.  CAUSES Acute bronchitis is most often caused by the same virus that causes a cold. The virus can spread from person to person (contagious) through coughing, sneezing, and touching contaminated objects. SIGNS AND SYMPTOMS   Cough.   Fever.   Coughing up mucus.   Body aches.   Chest congestion.   Chills.   Shortness of breath.   Sore throat.  DIAGNOSIS  Acute  bronchitis is usually diagnosed through a physical exam. Your health care provider will also ask you questions about your medical history. Tests, such as chest X-rays, are sometimes done to rule out other conditions.  TREATMENT  Acute bronchitis usually goes away in a couple weeks. Oftentimes, no medical treatment is necessary. Medicines are sometimes given for relief of fever or cough. Antibiotic medicines are usually not needed but may be prescribed in certain situations. In some cases, an inhaler may be recommended to help reduce shortness of breath and control the cough. A cool mist vaporizer may also be used to help thin bronchial secretions and make it easier to clear the chest.  HOME CARE INSTRUCTIONS  Get plenty of rest.   Drink enough fluids to keep your urine clear or pale yellow (unless you have a medical condition that requires fluid restriction). Increasing fluids may help thin your respiratory secretions (sputum) and reduce chest congestion, and it will prevent dehydration.   Take medicines only as directed by your health care provider.  If you were prescribed an antibiotic medicine, finish it all even if you start to feel better.  Avoid smoking and secondhand smoke. Exposure to cigarette smoke or irritating chemicals will make bronchitis worse. If you are a smoker, consider using nicotine gum or skin patches to help control withdrawal symptoms. Quitting smoking will help your lungs heal faster.   Reduce the chances of another bout of acute bronchitis by washing your hands frequently, avoiding people with cold symptoms, and trying not to touch your hands  to your mouth, nose, or eyes.   Keep all follow-up visits as directed by your health care provider.  SEEK MEDICAL CARE IF: Your symptoms do not improve after 1 week of treatment.  SEEK IMMEDIATE MEDICAL CARE IF:  You develop an increased fever or chills.   You have chest pain.   You have severe shortness of  breath.  You have bloody sputum.   You develop dehydration.  You faint or repeatedly feel like you are going to pass out.  You develop repeated vomiting.  You develop a severe headache. MAKE SURE YOU:   Understand these instructions.  Will watch your condition.  Will get help right away if you are not doing well or get worse.   This information is not intended to replace advice given to you by your health care provider. Make sure you discuss any questions you have with your health care provider.   Document Released: 09/13/2004 Document Revised: 08/27/2014 Document Reviewed: 01/27/2013 Elsevier Interactive Patient Education [employer].

## 2016-02-13 ENCOUNTER — Encounter: Payer: Self-pay | Admitting: Gynecology

## 2016-02-13 ENCOUNTER — Ambulatory Visit (INDEPENDENT_AMBULATORY_CARE_PROVIDER_SITE_OTHER): Payer: Medicare Other | Admitting: Gynecology

## 2016-02-13 VITALS — BP 120/82 | Ht 60.0 in | Wt 138.0 lb

## 2016-02-13 DIAGNOSIS — IMO0002 Reserved for concepts with insufficient information to code with codable children: Secondary | ICD-10-CM

## 2016-02-13 DIAGNOSIS — R896 Abnormal cytological findings in specimens from other organs, systems and tissues: Secondary | ICD-10-CM | POA: Diagnosis not present

## 2016-02-13 NOTE — Patient Instructions (Signed)
Conization of the Cervix, Care After Refer to this sheet in the next few weeks. These instructions provide you with information on caring for yourself after your procedure. Your health care provider may also give you more specific instructions. Your treatment has been planned according to current medical practices but problems sometimes occur. Call your health care provider if you have any problems or questions after your procedure. WHAT TO EXPECT AFTER THE PROCEDURE After your procedure, it is typical to have the following sensations:  If you had a general anesthetic, you may be groggy for 2-3 hours after the procedure.  You may have cramps (similar to menstrual cramps) for about 1 week.   You may have a bloody discharge or light to moderate bleeding for 1-2 weeks. The bleeding should not be heavy (for example, it should not soak 1 pad in less than 1 hour).  You may have a black vaginal discharge that looks similar to coffee grounds. This is from the paste that was applied to the cervix to control bleeding. This is normal. Recovery may take up to 3 weeks.  HOME CARE INSTRUCTIONS   Arrange for someone to drive you home after the procedure.  Only take medicines as directed by your health care provider. Do not take aspirin. It can cause bleeding.   Take showers for the first week. Do not take baths, swim, or use hot tubs until your health care provider says it is okay.   Do not douche, use tampons, or have sexual intercourse until your health care provider says it is okay.   Avoid strenuous activities, exercises, and heavy lifting for at least 7-14 days.  You may resume your normal diet unless your health care provider advises you differently.    If you are constipated, you may:  Take a mild laxative as directed by your health care provider.   Add fruit and bran to your diet.   Make sure to drink enough fluids to keep your urine clear or pale yellow.  Keep follow-up  appointments with your health care provider. SEEK MEDICAL CARE IF:   You develop a rash.   You are dizzy or lightheaded.   You feel nauseous.   You develop a bad smelling vaginal discharge. SEEK IMMEDIATE MEDICAL CARE IF:   You have blood clots or bleeding that is heavier than a normal menstrual period (for example, soaking a pad in less than 1 hour) or you develop bright red bleeding.   You have a fever over 101F (38.3C) or persistent symptoms for more than 2-3 days.   You have a fever over 101F (38.3C) and your symptoms suddenly get worse.  You have increasing cramps.   You faint.   You have pain when urinating.  You have bloody urine.   You start vomiting.   Your pain is not relieved with your medicine.   Your have severe or worsening pain. MAKE SURE YOU:  Understand these instructions.  Will watch your condition.  Will get help right away if you are not doing well or get worse.   This information is not intended to replace advice given to you by your health care provider. Make sure you discuss any questions you have with your health care provider.   Document Released: 08/06/2005 Document Revised: 08/11/2013 Document Reviewed: 01/30/2013 Elsevier Interactive Patient Education 2016 Elsevier Inc. Loop Electrosurgical Excision Procedure Loop electrosurgical excision procedure (LEEP) is the removal of a portion of the lower part of the uterus (cervix). The procedure is   done when there are significantly abnormal cervical cell changes. Abnormal cell changes of the cervix can lead to cancer if left in place and untreated.  The LEEP procedure itself typically only takes a few minutes. Often, it may be done in your caregiver's office. The procedure is considered safe for those who wish to get pregnant or are trying to get pregnant. Only under rare circumstances should this procedure be done if you are pregnant. LET YOUR CAREGIVER KNOW ABOUT:  Whether you are  pregnant or late for your last menstrual period.  Allergies to foods or medicines.  All the medicines you are taking includingherbs, eyedrops, and over-the-counter medicines, and creams.  Use of steroids (by mouth or creams).  Previous problems with anesthetics or numbing medicine.  Previous gynecological surgery.  History of blood clots or bleeding problems.  Any recent or current vaginal infections (herpes, sexually transmitted infections).  Other health problems. RISKS AND COMPLICATIONS  Bleeding.  Infection.  Injury to the vagina, bladder, or rectum.  Very rare obstruction of the cervical opening that causes problems during menstruation (cervical stenosis). BEFORE THE PROCEDURE  Do not take aspirin or blood thinners (anticoagulants) for 1 week before the procedure, or as told by your caregiver.  Eat a light meal before the procedure.  Ask your caregiver about changing or stopping your regular medicines.  You may be given a pain reliever 1 or 2 hours before the procedure. PROCEDURE   A tool (speculum) is placed in the vagina. This allows your caregiver to see the cervix.  An iodine stain is applied to the cervix to find the area of abnormal cells to be removed.  Medicine is injected to numb the cervix (local anesthetic).   Electricity is passed through a thin wire loop which is then used to remove (cauterize) a small segment of the affected cervix.  Light electrocautery is used to seal any small blood vessels and prevent bleeding.  A paste may be applied to the cauterized area of the cervix to help prevent bleeding.  The tissue sample is sent to the lab. It is examined under the microscope. AFTER THE PROCEDURE  Have someone drive you home.  You may have slight to moderate cramping.  You may notice a black vaginal discharge from the paste used on the cervix to prevent bleeding. This is normal.  Watch for excessive bleeding. This requires immediate medical  care.  Ask when your test results will be ready. Make sure you get your test results.   This information is not intended to replace advice given to you by your health care provider. Make sure you discuss any questions you have with your health care provider.   Document Released: 10/27/2002 Document Revised: 10/29/2011 Document Reviewed: 01/16/2011 Elsevier Interactive Patient Education [employer].

## 2016-02-13 NOTE — Progress Notes (Signed)
Patient is a 65 year old who presented to the office today for further discussion of her abnormal Pap smear. She was seen the office on April 20 as a courtesy of Dr. 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 Review of her record indicated that back in 2013 she had a negative Pap smear but positive for HPV. Patient stated many years ago she had some form of dysplasia and had biopsy and follow-up but no treatment had been given. Patient not sexually active currently on Vagifem 10 g twice a week for vaginal atrophy. Patient was counseled to undergo a detail colposcopic evaluation as follows:  The external genitalia was inspected colposcopically as well as the perineum and perirectal region and lesions were seen. Suspect was introduced into the vagina. A systematic inspection of the vagina demonstrated atrophic mucosa acetic acid was applied no lesions were noted on the fornix scattered small flat acetowhite areas were noted around the 12:00 area. The transformation sewn was not visualized due to the fact that she has a very stenotic os making it impossible to obtain an ECC.  Physical Exam  Genitourinary:     Pathology report demonstrated the following: Report    Comments: FINAL DIAGNOSIS:  A. Cervix- Biopsy, 12 o'clock:     Low grade squamous intraepithelial lesion (LSIL), mild dysplasia and HPV infection,  CIN I.     See comment.  COMMENT:  The above findings correlate with the patient's previous pap test ([id number], [employer]).   Sheryl North, MD, [place]         Patient was counseled for LEEP cervical conization due to the fact that we could not sample the endocervical canal.  LEEP (Leep electrosurgical excision procedure)    Patient Name:Sheryl [name]  Record [id number]  Indication For Surgery: LGSIL stenotic cervical loss inability to perform ECC  Surgeon: Terrance Mass  Anesthesia: 2% lidocaine total 10 cc paracervical block   Procedure:  LEEP (Loop electrosurgical excision procedure) Description of Operation:  After the patient was verbally counseled the patient was placed in the low lithotomy position.  A coated speculum was inserted into the vagina and colposcopic examination was performed with 4% acidic acid with findings noted above.  The cervix was then painted with Lugol's solution to delineate the margins of the lesion and transformation zone.  Approximately 10 cc's of 2% xylocaine with epinephrine was infiltrated deep near the outer margin of the transformation zone circumferentially at 12, 3, 6, and 9 o'clock positions.  The [facility] Electrosurgical Generator was then turned on after the patient was grounded with pad electrode on her thigh and jewelry removed.  The settings on the generator were Blend 1 current 70 watts cut and 70 watts on the coagulation mode.  A size 20 x 12 loop electrode was utilized to exercise the atypical transformation zone.  The tip of the electrode was placed 3 mm from the edge of the lesion at 3, 6, 9 and 12 o'clock position.  The electrode was moved slowly over the lesion was within the loop limits.  A vaginal wall retractor was used.  The loop was then repositioned and the finger switch on the hand held piece was activated ( or footpedal depressed).  A slight pressure on the shaft was applied and the loop was extended into the tissue up to its crossbar to a depth of 6 mm, then with steady, slow motion across and underneath the endocervical button  was excised.  The loop electrode was replaced with ball electrode set at 50 watts and the base of the crater was fulgurated circumferentially.  Monsell's paint was then applied for additional hemostasis.  A suture was placed at 12 o'clock position of cervical biopsy specimen for orientation, and placed in formation fixative for pathology evaluation.  Patient tolerated the  procedure well with minimal blood loss and without any complications.  After the procedure patient left office with stable vital signs and instructions sheet. Patient will return back in 3 weeks for postop visit.   [facility] HMD3:35 PMTD@

## 2016-02-14 ENCOUNTER — Ambulatory Visit (INDEPENDENT_AMBULATORY_CARE_PROVIDER_SITE_OTHER): Payer: Medicare Other

## 2016-02-14 ENCOUNTER — Ambulatory Visit (INDEPENDENT_AMBULATORY_CARE_PROVIDER_SITE_OTHER): Payer: Medicare Other | Admitting: Physician Assistant

## 2016-02-14 VITALS — BP 126/80 | HR 110 | Temp 98.1°F | Resp 18 | Ht 60.0 in | Wt 140.0 lb

## 2016-02-14 DIAGNOSIS — J441 Chronic obstructive pulmonary disease with (acute) exacerbation: Secondary | ICD-10-CM

## 2016-02-14 LAB — POCT CBC
GRANULOCYTE PERCENT: 72.3 % (ref 37–80)
HEMATOCRIT: 35.5 % — AB (ref 37.7–47.9)
HEMOGLOBIN: 12.4 g/dL (ref 12.2–16.2)
LYMPH, POC: 2.3 (ref 0.6–3.4)
MCH: 31.3 pg — AB (ref 27–31.2)
MCHC: 34.8 g/dL (ref 31.8–35.4)
MCV: 89.9 fL (ref 80–97)
MID (cbc): 1.6 — AB (ref 0–0.9)
MPV: 6.8 fL (ref 0–99.8)
POC GRANULOCYTE: 10.2 — AB (ref 2–6.9)
POC LYMPH PERCENT: 16.4 %L (ref 10–50)
POC MID %: 11.3 % (ref 0–12)
Platelet Count, POC: 354 10*3/uL (ref 142–424)
RBC: 3.95 M/uL — AB (ref 4.04–5.48)
RDW, POC: 13.4 %
WBC: 14.1 10*3/uL — AB (ref 4.6–10.2)

## 2016-02-14 LAB — POCT GLYCOSYLATED HEMOGLOBIN (HGB A1C): Hemoglobin A1C: 6

## 2016-02-14 MED ORDER — AZITHROMYCIN 250 MG PO TABS: ORAL_TABLET | ORAL | Status: DC

## 2016-02-14 MED ORDER — PREDNISONE 20 MG PO TABS: ORAL_TABLET | ORAL | Status: AC

## 2016-02-14 NOTE — Progress Notes (Signed)
02/14/2016 2:53 PM   DOB: 1951/04/24 / MRN: [id number]  SUBJECTIVE:  Sheryl [name] is a 65 y.o. female presenting for cough that has been ongoing for the last month but worse over the last ten days. She saw Dr. Tamala Julian a few weeks back and was tried on Abx therapy and this did not really help her symptoms. She feels she is getting worse.  Complains of some mild SOB and DOE, denies chest pain.   She is allergic to macrobid.   She  has a past medical history of Allergy; Arthritis; Osteoporosis; and COPD (chronic obstructive pulmonary disease) ([place]) (12/09/2014).    She  reports that she has been smoking Cigarettes.  She started smoking about 2 months ago. She has a 24.5 pack-year smoking history. She does not have any smokeless tobacco history on file. She reports that she does not drink alcohol or use illicit drugs. She  reports that she does not engage in sexual activity. The patient  has past surgical history that includes Joint replacement and Replacement total knee (2013).  Her family history is not on file.  Review of Systems  Constitutional: Negative for fever and chills.  Respiratory: Positive for cough and shortness of breath. Negative for hemoptysis, sputum production and wheezing.   Cardiovascular: Negative for chest pain.  Gastrointestinal: Negative for nausea.  Musculoskeletal: Negative for myalgias.  Skin: Negative for rash.  Neurological: Negative for dizziness.  Psychiatric/Behavioral: Negative for depression.    Problem list and medications reviewed and updated by myself where necessary, and exist elsewhere in the encounter.   OBJECTIVE:  BP 126/80 mmHg  Pulse 110  Temp(Src) 98.1 F (36.7 C) (Oral)  Resp 18  Ht 5' (1.524 m)  Wt 140 lb (63.504 kg)  BMI 27.34 kg/m2  SpO2 95%  Physical Exam  Constitutional: She is oriented to person, place, and time. She appears well-nourished. No distress.  Eyes: EOM are normal. Pupils are equal, round, and reactive to light.    Cardiovascular: Normal rate.   Pulmonary/Chest: Effort normal.  Abdominal: She exhibits no distension.  Musculoskeletal:       Right lower leg: She exhibits no swelling.       Left lower leg: She exhibits no swelling.  Neurological: She is alert and oriented to person, place, and time. No cranial nerve deficit. Gait normal.  Skin: Skin is dry. She is not diaphoretic.  Psychiatric: She has a normal mood and affect.  Vitals reviewed.   Lab Results  Component Value Date   HGBA1C 6.0 02/14/2016     Results for orders placed or performed in visit on 02/14/16 (from the past 72 hour(s))  POCT CBC     Status: Abnormal   Collection Time: 02/14/16  2:11 PM  Result Value Ref Range   WBC 14.1 (A) 4.6 - 10.2 K/uL   Lymph, poc 2.3 0.6 - 3.4   POC LYMPH PERCENT 16.4 10 - 50 %L   MID (cbc) 1.6 (A) 0 - 0.9   POC MID % 11.3 0 - 12 %M   POC Granulocyte 10.2 (A) 2 - 6.9   Granulocyte percent 72.3 37 - 80 %G   RBC 3.95 (A) 4.04 - 5.48 M/uL   Hemoglobin 12.4 12.2 - 16.2 g/dL   HCT, POC 35.5 (A) 37.7 - 47.9 %   MCV 89.9 80 - 97 fL   MCH, POC 31.3 (A) 27 - 31.2 pg   MCHC 34.8 31.8 - 35.4 g/dL   RDW, POC 13.4 %  Platelet Count, POC 354 142 - 424 K/uL   MPV 6.8 0 - 99.8 fL  POCT glycosylated hemoglobin (Hb A1C)     Status: None   Collection Time: 02/14/16  2:15 PM  Result Value Ref Range   Hemoglobin A1C 6.0     Dg Chest 2 View  02/14/2016  CLINICAL DATA:  COPD EXAM: CHEST  2 VIEW COMPARISON:  08/24/2015 FINDINGS: Cardiomediastinal silhouette is stable. No acute infiltrate or pleural effusion. No pulmonary edema. Bony thorax is unremarkable. IMPRESSION: No active cardiopulmonary disease. Electronically Signed   By: Lahoma Crocker M.D.   On: 02/14/2016 14:34    ASSESSMENT AND PLAN  Sheryl [name] was seen today for cough and bronchitis.  Diagnoses and all orders for this visit:  COPD with exacerbation [facility]): She has a mild leukocytosis however rads are clear. She is well appearing and lung sounds  are clear.  Advised patient to call tomorrow if not improving with pred and abx therapy.  -     POCT glycosylated hemoglobin (Hb A1C) -     POCT CBC -     DG Chest 2 View; Future -     azithromycin (ZITHROMAX) 250 MG tablet; Take two on day one and one daily there after. -     predniSONE (DELTASONE) 20 MG tablet; Take 3 in the morning for 3 days, then 2 in the morning for 3 days, and then 1 in the morning for 3 days.    The patient was advised to call or return to clinic if she does not see an improvement in symptoms or to seek the care of the closest emergency department if she worsens with the above plan.   Philis Fendt, MHS, PA-C [facility] 02/14/2016 2:53 PM

## 2016-02-14 NOTE — Patient Instructions (Addendum)
     IF you received an x-ray today, you will receive an invoice from [facility]. Please contact [facility] at [phone] with questions or concerns regarding your invoice.   IF you received labwork today, you will receive an invoice from [facility]. Please contact [facility] at [phone] with questions or concerns regarding your invoice.   Our billing staff will not be able to assist you with questions regarding bills from these companies.  You will be contacted with the lab results as soon as they are available. The fastest way to get your results is to activate your My Chart account. Instructions are located on the last page of this paperwork. If you have not heard from us regarding the results in 2 weeks, please contact this office.     We recommend that you schedule a mammogram for breast cancer screening. Typically, you do not need a referral to do this. Please contact a local imaging center to schedule your mammogram.  [facility] - [phone]  *ask for the Radiology Department [facility]) - [phone] or [phone]  [facility] - [phone] [facility] - [phone] [facility]  - [phone]  *ask for the Radiology Department [facility] - [phone]  *ask for the Radiology Department [facility] - [phone]  *ask for the Mammography Department [facility] - [phone]  

## 2016-02-20 ENCOUNTER — Other Ambulatory Visit: Payer: Self-pay | Admitting: Internal Medicine

## 2016-02-23 ENCOUNTER — Other Ambulatory Visit: Payer: Self-pay | Admitting: Gynecology

## 2016-02-23 ENCOUNTER — Ambulatory Visit (INDEPENDENT_AMBULATORY_CARE_PROVIDER_SITE_OTHER): Payer: Medicare Other | Admitting: Gynecology

## 2016-02-23 ENCOUNTER — Encounter: Payer: Self-pay | Admitting: Gynecology

## 2016-02-23 VITALS — BP 132/86

## 2016-02-23 DIAGNOSIS — IMO0001 Reserved for inherently not codable concepts without codable children: Secondary | ICD-10-CM

## 2016-02-23 DIAGNOSIS — N898 Other specified noninflammatory disorders of vagina: Secondary | ICD-10-CM

## 2016-02-23 DIAGNOSIS — R300 Dysuria: Secondary | ICD-10-CM

## 2016-02-23 DIAGNOSIS — N3000 Acute cystitis without hematuria: Secondary | ICD-10-CM

## 2016-02-23 DIAGNOSIS — R350 Frequency of micturition: Secondary | ICD-10-CM

## 2016-02-23 LAB — URINALYSIS W MICROSCOPIC + REFLEX CULTURE
BILIRUBIN URINE: NEGATIVE
CASTS: NONE SEEN [LPF]
Glucose, UA: NEGATIVE
NITRITE: NEGATIVE
PH: 6.5 (ref 5.0–8.0)
Protein, ur: NEGATIVE
SPECIFIC GRAVITY, URINE: 1.01 (ref 1.001–1.035)
Yeast: NONE SEEN [HPF]

## 2016-02-23 LAB — WET PREP FOR TRICH, YEAST, CLUE
Clue Cells Wet Prep HPF POC: NONE SEEN
Trich, Wet Prep: NONE SEEN
Yeast Wet Prep HPF POC: NONE SEEN

## 2016-02-23 MED ORDER — ESTRADIOL 10 MCG VA TABS: ORAL_TABLET | VAGINAL | Status: DC

## 2016-02-23 MED ORDER — CIPROFLOXACIN HCL 250 MG PO TABS: 250.0000 mg | ORAL_TABLET | Freq: Two times a day (BID) | ORAL | Status: DC

## 2016-02-23 MED ORDER — PHENAZOPYRIDINE HCL 97.2 MG PO TABS: 97.0000 mg | ORAL_TABLET | Freq: Three times a day (TID) | ORAL | Status: DC | PRN

## 2016-02-23 NOTE — Progress Notes (Signed)
   HPI: Patient is a 65 year old that presented to the office today complaining the past several days of dysuria and frequency and mild back discomfort. She denies any fever, chills, nausea, vomiting. No change in appetite. No change in weight. 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 She complains of slight vaginal discharge.    ROS: A ROS was performed and pertinent positives and negatives are included in the history.  GENERAL: No fevers or chills. HEENT: No change in vision, no earache, sore throat or sinus congestion. NECK: No pain or stiffness. CARDIOVASCULAR: No chest pain or pressure. No palpitations. PULMONARY: No shortness of breath, cough or wheeze. GASTROINTESTINAL: No abdominal pain, nausea, vomiting or diarrhea, melena or bright red blood per rectum. GENITOURINARY: No urinary frequency, urgency, hesitancy or dysuria. MUSCULOSKELETAL: No joint or muscle pain, no back pain, no recent trauma. DERMATOLOGIC: No rash, no itching, no lesions. ENDOCRINE: No polyuria, polydipsia, no heat or cold intolerance. No recent change in weight. HEMATOLOGICAL: No anemia or easy bruising or bleeding. NEUROLOGIC: No headache, seizures, numbness, tingling or weakness. PSYCHIATRIC: No depression, no loss of interest in normal activity or change in sleep pattern.   PE: Blood pressure 132/86 Gen. appearance well-developed on her female with the above-mentioned complaint Back: No CVA tenderness Abdomen: Soft some suprapubic tenderness but no rebound or guarding Pelvic: Bartholin urethra Skene was within normal limits atrophic changes Vagina: No lesions or discharge Cervix: Cervical bed healing from recent LEEP cervical conization Bimanual exam: Small anteverted uterus some suprapubic tenderness Adnexa: No palpable mass or tenderness Rectal exam: Not done  Urinalysis: 10-20 WBC, 0-2 RBC and many  bacteria  Wet prep many bacteria and white blood cells   Assessment Plan: Clinical evidence of urinary tract infection. Patient was instructed to increase her fluid intake. She is allergic to Macrobid so we'll place her on Cipro 250 mg twice a day for 3 days. For her bladder spasms she'll be placed on Pyridium 200 mg 3 times a day for 3 days. She was instructed also been using her Vagifem 10 g palate intravaginally twice a week. She is scheduled for a postop visit in 2 weeks.    Greater than 50% of time was spent in counseling and coordinating care of this patient.   Time of consultation: 15   Minutes.

## 2016-02-23 NOTE — Patient Instructions (Signed)
Urinary Tract Infection Urinary tract infections (UTIs) can develop anywhere along your urinary tract. Your urinary tract is your body's drainage system for removing wastes and extra water. Your urinary tract includes two kidneys, two ureters, a bladder, and a urethra. Your kidneys are a pair of bean-shaped organs. Each kidney is about the size of your fist. They are located below your ribs, one on each side of your spine. CAUSES Infections are caused by microbes, which are microscopic organisms, including fungi, viruses, and bacteria. These organisms are so small that they can only be seen through a microscope. Bacteria are the microbes that most commonly cause UTIs. SYMPTOMS  Symptoms of UTIs may vary by age and gender of the patient and by the location of the infection. Symptoms in young women typically include a frequent and intense urge to urinate and a painful, burning feeling in the bladder or urethra during urination. Older women and men are more likely to be tired, shaky, and weak and have muscle aches and abdominal pain. A fever may mean the infection is in your kidneys. Other symptoms of a kidney infection include pain in your back or sides below the ribs, nausea, and vomiting. DIAGNOSIS To diagnose a UTI, your caregiver will ask you about your symptoms. Your caregiver will also ask you to provide a urine sample. The urine sample will be tested for bacteria and white blood cells. White blood cells are made by your body to help fight infection. TREATMENT  Typically, UTIs can be treated with medication. Because most UTIs are caused by a bacterial infection, they usually can be treated with the use of antibiotics. The choice of antibiotic and length of treatment depend on your symptoms and the type of bacteria causing your infection. HOME CARE INSTRUCTIONS  If you were prescribed antibiotics, take them exactly as your caregiver instructs you. Finish the medication even if you feel better after  you have only taken some of the medication.  Drink enough water and fluids to keep your urine clear or pale yellow.  Avoid caffeine, tea, and carbonated beverages. They tend to irritate your bladder.  Empty your bladder often. Avoid holding urine for long periods of time.  Empty your bladder before and after sexual intercourse.  After a bowel movement, women should cleanse from front to back. Use each tissue only once. SEEK MEDICAL CARE IF:   You have back pain.  You develop a fever.  Your symptoms do not begin to resolve within 3 days. SEEK IMMEDIATE MEDICAL CARE IF:   You have severe back pain or lower abdominal pain.  You develop chills.  You have nausea or vomiting.  You have continued burning or discomfort with urination. MAKE SURE YOU:   Understand these instructions.  Will watch your condition.  Will get help right away if you are not doing well or get worse.   This information is not intended to replace advice given to you by your health care provider. Make sure you discuss any questions you have with your health care provider.   Document Released: 05/16/2005 Document Revised: 04/27/2015 Document Reviewed: 09/14/2011 Elsevier Interactive Patient Education 2016 [employer] Ciprofloxacin tablets What is this medicine? CIPROFLOXACIN (sip roe FLOX a sin) is a quinolone antibiotic. It is used to treat certain kinds of bacterial infections. It will not work for colds, flu, or other viral infections. This medicine may be used for other purposes; ask your health care provider or pharmacist if you have questions. What should I tell my  health care provider before I take this medicine? They need to know if you have any of these conditions: -bone problems -cerebral disease -history of low levels of potassium in the blood -joint problems -irregular heartbeat -kidney disease -myasthenia gravis -seizures -tendon problems -tingling of the fingers or toes, or other  nerve disorder -an unusual or allergic reaction to ciprofloxacin, other antibiotics or medicines, foods, dyes, or preservatives -pregnant or trying to get pregnant -breast-feeding How should I use this medicine? Take this medicine by mouth with a glass of water. Follow the directions on the prescription label. Take your medicine at regular intervals. Do not take your medicine more often than directed. Take all of your medicine as directed even if you think your are better. Do not skip doses or stop your medicine early. You can take this medicine with food or on an empty stomach. It can be taken with a meal that contains dairy or calcium, but do not take it alone with a dairy product, like milk or yogurt or calcium-fortified juice. A special MedGuide will be given to you by the pharmacist with each prescription and refill. Be sure to read this information carefully each time. Talk to your pediatrician regarding the use of this medicine in children. Special care may be needed. Overdosage: If you think you have taken too much of this medicine contact a poison control center or emergency room at once. NOTE: This medicine is only for you. Do not share this medicine with others. What if I miss a dose? If you miss a dose, take it as soon as you can. If it is almost time for your next dose, take only that dose. Do not take double or extra doses. What may interact with this medicine? Do not take this medicine with any of the following medications: -cisapride -droperidol -terfenadine -tizanidine This medicine may also interact with the following medications: -antacids -birth control pills -caffeine -cyclosporin -didanosine (ddI) buffered tablets or powder -medicines for diabetes -medicines for inflammation like ibuprofen, naproxen -methotrexate -multivitamins -omeprazole -phenytoin -probenecid -sucralfate -theophylline -warfarin This list may not describe all possible interactions. Give your  health care provider a list of all the medicines, herbs, non-prescription drugs, or dietary supplements you use. Also tell them if you smoke, drink alcohol, or use illegal drugs. Some items may interact with your medicine. What should I watch for while using this medicine? Tell your doctor or health care professional if your symptoms do not improve. Do not treat diarrhea with over the counter products. Contact your doctor if you have diarrhea that lasts more than 2 days or if it is severe and watery. You may get drowsy or dizzy. Do not drive, use machinery, or do anything that needs mental alertness until you know how this medicine affects you. Do not stand or sit up quickly, especially if you are an older patient. This reduces the risk of dizzy or fainting spells. This medicine can make you more sensitive to the sun. Keep out of the sun. If you cannot avoid being in the sun, wear protective clothing and use sunscreen. Do not use sun lamps or tanning beds/booths. Avoid antacids, aluminum, calcium, iron, magnesium, and zinc products for 6 hours before and 2 hours after taking a dose of this medicine. What side effects may I notice from receiving this medicine? Side effects that you should report to your doctor or health care professional as soon as possible: -allergic reactions like skin rash or hives, swelling of the face, lips, or  tongue -anxious -confusion -depressed mood -diarrhea -fast, irregular heartbeat -hallucination, loss of contact with reality -joint, muscle, or tendon pain or swelling -pain, tingling, numbness in the hands or feet -suicidal thoughts or other mood changes -sunburn -unusually weak or tired Side effects that usually do not require medical attention (report to your doctor or health care professional if they continue or are bothersome): -dry mouth -headache -nausea -trouble sleeping This list may not describe all possible side effects. Call your doctor for medical  advice about side effects. You may report side effects to FDA at 1-800-FDA-1088. Where should I keep my medicine? Keep out of the reach of children. Store at room temperature below 30 degrees C (86 degrees F). Keep container tightly closed. Throw away any unused medicine after the expiration date. NOTE: This sheet is a summary. It may not cover all possible information. If you have questions about this medicine, talk to your doctor, pharmacist, or health care provider.    2016, Elsevier/Gold Standard. (2015-03-17 12:57:02)

## 2016-02-26 LAB — URINE CULTURE

## 2016-02-28 ENCOUNTER — Telehealth: Payer: Self-pay | Admitting: *Deleted

## 2016-02-28 MED ORDER — LEVOFLOXACIN 250 MG PO TABS: 250.0000 mg | ORAL_TABLET | Freq: Every day | ORAL | Status: DC

## 2016-02-28 NOTE — Telephone Encounter (Signed)
Pt was prescribed Cipro 250 mg on OV 02/23/16, states she doesn't feel 100% better, c/o burning with urination only. Please advise

## 2016-02-28 NOTE — Telephone Encounter (Signed)
Tell her that I reviewed her urine culture and sensitivity. I know that she is allergic to Macrobid. The Cipro was sensitive to this organism. Also Levaquin. Please call in prescription for Levaquin 250 mg one by mouth daily for 3 days

## 2016-02-28 NOTE — Telephone Encounter (Signed)
Pt aware, Rx sent. 

## 2016-03-05 ENCOUNTER — Other Ambulatory Visit: Payer: Self-pay | Admitting: *Deleted

## 2016-03-05 ENCOUNTER — Encounter: Payer: Self-pay | Admitting: Gynecology

## 2016-03-05 ENCOUNTER — Ambulatory Visit (INDEPENDENT_AMBULATORY_CARE_PROVIDER_SITE_OTHER): Payer: Medicare Other | Admitting: Gynecology

## 2016-03-05 VITALS — BP 138/84

## 2016-03-05 DIAGNOSIS — N898 Other specified noninflammatory disorders of vagina: Secondary | ICD-10-CM

## 2016-03-05 DIAGNOSIS — R300 Dysuria: Secondary | ICD-10-CM | POA: Diagnosis not present

## 2016-03-05 DIAGNOSIS — Z09 Encounter for follow-up examination after completed treatment for conditions other than malignant neoplasm: Secondary | ICD-10-CM

## 2016-03-05 DIAGNOSIS — N3000 Acute cystitis without hematuria: Secondary | ICD-10-CM

## 2016-03-05 DIAGNOSIS — N390 Urinary tract infection, site not specified: Secondary | ICD-10-CM | POA: Diagnosis not present

## 2016-03-05 LAB — WET PREP FOR TRICH, YEAST, CLUE
Clue Cells Wet Prep HPF POC: NONE SEEN
Trich, Wet Prep: NONE SEEN
Yeast Wet Prep HPF POC: NONE SEEN

## 2016-03-05 LAB — URINALYSIS W MICROSCOPIC + REFLEX CULTURE
BILIRUBIN URINE: NEGATIVE
CRYSTALS: NONE SEEN [HPF]
Casts: NONE SEEN [LPF]
Glucose, UA: NEGATIVE
Nitrite: NEGATIVE
PH: 7 (ref 5.0–8.0)
PROTEIN: NEGATIVE
Specific Gravity, Urine: 1.015 (ref 1.001–1.035)
Yeast: NONE SEEN [HPF]

## 2016-03-05 MED ORDER — PHENAZOPYRIDINE HCL 200 MG PO TABS: ORAL_TABLET | ORAL | Status: DC

## 2016-03-05 MED ORDER — CEFUROXIME AXETIL 250 MG PO TABS: ORAL_TABLET | ORAL | Status: DC

## 2016-03-05 NOTE — Progress Notes (Signed)
   Patient is a 65 year old that in June of this year had a LEEP cervical conization as a result of low-grade squamous epithelial lesion. She is here for 3 week postop visit. She was seen the office on July 6 complaining of dysuria. She was started on Cipro 250 mg twice a day for 3 days along with Pyridium 200 mg 3 times a day for 3 days. She states that she feels like her vagina is burning when she urinates and is still has frequency as well. We reviewed her last culture and sensitivity and it was Escherichia coli.  Pathology report as follows:   Diagnosis 1. Cervix, LEEP, button - KOILOCYTIC ATYPIA, SEE COMMENT. 2. Cervix, LEEP, 12 o'clock - BENIGN ENDOCERVICAL MUCOSA. Microscopic Comment 1. 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 Some of the foci appear to involve the ectocervical margin of resection. Definite or diagnostic high grade squamous intraepithelial lesion is not appreciated and a p16 stain performed on multiple blocks is negative. Clinical correlation and follow-up is recommended.   Urinalysis: 6-10 WBC, 0-2 RBC and moderate bacteria  Wet prep negative  Exam: Abdomen: Soft nontender no rebound or guarding Pelvic: Bartholin urethra Skene was within normal limits Vagina: No lesions or discharge Cervix: Cervical bed 90% healed slightly friable silver nitrate applied Bimanual exam some suprapubic discomfort but no rebound or guarding and no adnexal masses Rectal exam: Not done  Assessment/plan: Patient status post LEEP cervical conization on June 26 as a result of low-grade squamous intraepithelial lesion with the above-mentioned pathology report reported. We'll follow-up with her Pap smear and HPV screen in 12 months. Because of her persistent urinary tract infection and findings today on urinalysis she will be  placed on Ceftin 250 mg twice a day for but for 10 days. Prescription for Pyridium 200 mg 3 times a day for 3 days was also provided. Patient to return back to the office in 3 weeks for final postop visit.

## 2016-03-05 NOTE — Addendum Note (Signed)
Addended by: Thurnell Garbe A on: 03/05/2016 03:56 PM   Modules accepted: Orders

## 2016-03-05 NOTE — Patient Instructions (Signed)
Phenazopyridine tablets  What is this medicine?  PHENAZOPYRIDINE (fen az oh PEER i deen) is a pain reliever. It is used to stop the pain, burning, or discomfort caused by infection or irritation of the urinary tract. This medicine is not an antibiotic. It will not cure a urinary tract infection.  This medicine may be used for other purposes; ask your health care provider or pharmacist if you have questions.  What should I tell my health care provider before I take this medicine?  They need to know if you have any of these conditions:  -glucose-6-phosphate dehydrogenase (G6PD) deficiency  -kidney disease  -an unusual or allergic reaction to phenazopyridine, other medicines, foods, dyes, or preservatives  -pregnant or trying to get pregnant  -breast-feeding  How should I use this medicine?  Take this medicine by mouth with a glass of water. Follow the directions on the prescription label. Take after meals. Take your doses at regular intervals. Do not take your medicine more often than directed. Do not skip doses or stop your medicine early even if you feel better. Do not stop taking except on your doctor's advice.  Talk to your pediatrician regarding the use of this medicine in children. Special care may be needed.  Overdosage: If you think you have taken too much of this medicine contact a poison control center or emergency room at once.  NOTE: This medicine is only for you. Do not share this medicine with others.  What if I miss a dose?  If you miss a dose, take it as soon as you can. If it is almost time for your next dose, take only that dose. Do not take double or extra doses.  What may interact with this medicine?  Interactions are not expected.  This list may not describe all possible interactions. Give your health care provider a list of all the medicines, herbs, non-prescription drugs, or dietary supplements you use. Also tell them if you smoke, drink alcohol, or use illegal drugs. Some items may interact  with your medicine.  What should I watch for while using this medicine?  Tell your doctor or health care professional if your symptoms do not improve or if they get worse.  This medicine colors body fluids red. This effect is harmless and will go away after you are done taking the medicine. It will change urine to an dark orange or red color. The red color may stain clothing. Soft contact lenses may become permanently stained. It is best not to wear soft contact lenses while taking this medicine.  If you are diabetic you may get a false positive result for sugar in your urine. Talk to your health care provider.  What side effects may I notice from receiving this medicine?  Side effects that you should report to your doctor or health care professional as soon as possible:  -allergic reactions like skin rash, itching or hives, swelling of the face, lips, or tongue  -blue or purple color of the skin  -difficulty breathing  -fever  -less urine  -unusual bleeding, bruising  -unusual tired, weak  -vomiting  -yellowing of the eyes or skin  Side effects that usually do not require medical attention (report to your doctor or health care professional if they continue or are bothersome):  -dark urine  -headache  -stomach upset  This list may not describe all possible side effects. Call your doctor for medical advice about side effects. You may report side effects to FDA at 1-800-FDA-1088.    expiration date. NOTE: This sheet is a summary. It may not cover all possible information. If you have questions about this medicine, talk to your doctor, pharmacist, or health care provider.    2016, Elsevier/Gold Standard. (2008-03-04 11:04:07) Cefuroxime tablets What is this medicine? CEFUROXIME (se fyoor OX eem)  is a cephalosporin antibiotic. It is used to treat certain kinds of bacterial infections. It will not work for colds, flu, or other viral infections. This medicine may be used for other purposes; ask your health care provider or pharmacist if you have questions. What should I tell my health care provider before I take this medicine? They need to know if you have any of these conditions: -bleeding problems -bowel disease, like colitis -kidney disease -liver disease -an unusual or allergic reaction to cefuroxime, other antibiotics or medicines, foods, dyes or preservatives -pregnant or trying to get pregnant -breast-feeding How should I use this medicine? Take this medicine by mouth with a full glass of water. Follow the directions on the prescription label. Do not crush or chew. This medicine works best if you take it with food. Take your medicine at regular intervals. Do not take your medicine more often than directed. Take all of your medicine as directed even if you think your are better. Do not skip doses or stop your medicine early. Talk to your pediatrician regarding the use of this medicine in children. Special care may be needed. While this drug may be prescribed for children as young as 30 months of age for selected conditions, precautions do apply. Overdosage: If you think you have taken too much of this medicine contact a poison control center or emergency room at once. NOTE: This medicine is only for you. Do not share this medicine with others. What if I miss a dose? If you miss a dose, take it as soon as you can. If it is almost time for your next dose, take only that dose. Do not take double or extra doses. What may interact with this medicine? This medicine may interact with the following medications: -antacids -birth control pills -certain medicines for infection like amikacin, gentamicin, tobramycin -diuretics -probenecid -warfarin This list may not describe all possible  interactions. Give your health care provider a list of all the medicines, herbs, non-prescription drugs, or dietary supplements you use. Also tell them if you smoke, drink alcohol, or use illegal drugs. Some items may interact with your medicine. What should I watch for while using this medicine? Tell your doctor or health care professional if your symptoms do not improve or if you get new symptoms. Do not treat diarrhea with over the counter products. Contact your doctor if you have diarrhea that lasts more than 2 days or if it is severe and watery. This medicine can interfere with some urine glucose tests. If you use such tests, talk with your health care professional. If you are being treated for a sexually transmitted disease, avoid sexual contact until you have finished your treatment. Your sexual partner may also need treatment. What side effects may I notice from receiving this medicine? Side effects that you should report to your doctor or health care professional as soon as possible: -allergic reactions like skin rash, itching or hives, swelling of the face, lips, or tongue -dark urine -difficulty breathing -fever -irregular heartbeat or chest pain -redness, blistering, peeling or loosening of the skin, including inside the mouth -seizures -unusual bleeding or bruising -unusually weak or tired -white patches or sores in the mouth Side  effects that usually do not require medical attention (report to your doctor or health care professional if they continue or are bothersome): -diarrhea -gas or heartburn -headache -nausea, vomiting -vaginal itching This list may not describe all possible side effects. Call your doctor for medical advice about side effects. You may report side effects to FDA at 1-800-FDA-1088. Where should I keep my medicine? Keep out of the reach of children. Store at room temperature between 15 and 30 degrees C (59 and 86 degrees F). Keep container tightly closed.  Protect from moisture. Throw away any unused medicine after the expiration date. NOTE: This sheet is a summary. It may not cover all possible information. If you have questions about this medicine, talk to your doctor, pharmacist, or health care provider.    2016, Elsevier/Gold Standard. (2013-06-22 09:43:18)

## 2016-03-08 LAB — URINE CULTURE

## 2016-03-09 ENCOUNTER — Other Ambulatory Visit: Payer: Self-pay | Admitting: Gynecology

## 2016-03-09 MED ORDER — AMPICILLIN 250 MG PO CAPS: 250.0000 mg | ORAL_CAPSULE | Freq: Two times a day (BID) | ORAL | Status: DC

## 2016-03-11 ENCOUNTER — Emergency Department (HOSPITAL_COMMUNITY)
Admission: EM | Admit: 2016-03-11 | Discharge: 2016-03-12 | Disposition: A | Discharge status: Home / Self Care | Payer: Medicare Other | Attending: Emergency Medicine | Admitting: Emergency Medicine

## 2016-03-11 ENCOUNTER — Encounter (HOSPITAL_COMMUNITY): Payer: Self-pay | Admitting: Emergency Medicine

## 2016-03-11 DIAGNOSIS — W1839XA Other fall on same level, initial encounter: Secondary | ICD-10-CM | POA: Insufficient documentation

## 2016-03-11 DIAGNOSIS — F17210 Nicotine dependence, cigarettes, uncomplicated: Secondary | ICD-10-CM | POA: Insufficient documentation

## 2016-03-11 DIAGNOSIS — S0993XA Unspecified injury of face, initial encounter: Secondary | ICD-10-CM | POA: Diagnosis present

## 2016-03-11 DIAGNOSIS — Z79899 Other long term (current) drug therapy: Secondary | ICD-10-CM | POA: Diagnosis not present

## 2016-03-11 DIAGNOSIS — J449 Chronic obstructive pulmonary disease, unspecified: Secondary | ICD-10-CM | POA: Insufficient documentation

## 2016-03-11 DIAGNOSIS — S0181XA Laceration without foreign body of other part of head, initial encounter: Secondary | ICD-10-CM | POA: Diagnosis not present

## 2016-03-11 DIAGNOSIS — Z792 Long term (current) use of antibiotics: Secondary | ICD-10-CM | POA: Insufficient documentation

## 2016-03-11 DIAGNOSIS — Y999 Unspecified external cause status: Secondary | ICD-10-CM | POA: Insufficient documentation

## 2016-03-11 DIAGNOSIS — Y929 Unspecified place or not applicable: Secondary | ICD-10-CM | POA: Diagnosis not present

## 2016-03-11 DIAGNOSIS — T148XXA Other injury of unspecified body region, initial encounter: Secondary | ICD-10-CM

## 2016-03-11 DIAGNOSIS — Y939 Activity, unspecified: Secondary | ICD-10-CM | POA: Diagnosis not present

## 2016-03-11 MED ORDER — LIDOCAINE-EPINEPHRINE 2 %-1:100000 IJ SOLN
1.7000 mL | Freq: Once | INTRAMUSCULAR | Status: AC
  Administered 2016-03-12: 1.7 mL
  Filled 2016-03-11: qty 1

## 2016-03-11 MED ORDER — OXYCODONE-ACETAMINOPHEN 5-325 MG PO TABS
1.0000 | ORAL_TABLET | ORAL | Status: DC | PRN
  Administered 2016-03-11: 1 via ORAL
  Filled 2016-03-11: qty 1

## 2016-03-11 NOTE — ED Triage Notes (Signed)
Pt states that her dog made her fall in the front yard making her hit her chin and her R forearm. Cuts noted to both. Alert and oriented. Denies blood thinners. Denies LOC.

## 2016-03-11 NOTE — ED Notes (Signed)
Pain medication given in Triage. Patient advised about side effects of medications and  to avoid driving for a minimum of 4 hours.  

## 2016-03-12 MED ORDER — BACITRACIN ZINC 500 UNIT/GM EX OINT
TOPICAL_OINTMENT | Freq: Two times a day (BID) | CUTANEOUS | Status: DC
  Administered 2016-03-12: 1 via TOPICAL
  Filled 2016-03-12: qty 0.9

## 2016-03-12 NOTE — ED Provider Notes (Addendum)
[facility] DEPT Provider Note   CSN: [id number] Arrival date & time: 03/11/16  2100  First Provider Contact:  First MD Initiated Contact with Patient 03/11/16 2319        History   Chief Complaint Chief Complaint  Patient presents with  . Fall    HPI Sheryl [name] is a 66 y.o. female.  Tripped in yard falling over her dog now with knee pain and laceration to chin Denies LOC    Fall  Associated symptoms include arthralgias. Pertinent negatives include no headaches or neck pain.    Past Medical History:  Diagnosis Date  . Allergy   . Arthritis   . COPD (chronic obstructive pulmonary disease) ([place]) 12/09/2014  . LGSIL (low grade squamous intraepithelial dysplasia)   . Osteoporosis     Patient Active Problem List   Diagnosis Date Noted  . 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 COPD (chronic obstructive pulmonary disease) ([place]) 12/09/2014  . COPD exacerbation ([place]) 11/02/2014  . Lumbago 07/16/2014  . Bilateral hand pain 07/16/2014  . Nicotine addiction 06/06/2012  . HTN (hypertension) 06/04/2012  . Hyperlipidemia 06/04/2012  . Depression 06/04/2012  . ADD (attention deficit disorder) 06/04/2012  . AR (allergic rhinitis) 06/04/2012    Past Surgical History:  Procedure Laterality Date  . JOINT REPLACEMENT    . REPLACEMENT TOTAL KNEE  2013    OB History    Gravida Para Term Preterm AB Living   2 2       2    SAB TAB Ectopic Multiple Live Births                   Home Medications    Prior to Admission medications   Medication Sig Start Date End Date Taking? Authorizing Provider  clobetasol cream (TEMOVATE) 0.05 % Apply topically 2 (two) times daily. 11/11/15  Yes Leandrew Koyanagi, MD  Estradiol 10 MCG TABS vaginal tablet Use 2x/ weekly for vaginal atrophy 02/23/16  Yes Terrance Mass, MD  FLUoxetine (PROZAC) 40 MG capsule Take 1 capsule (40 mg total) by mouth daily. 11/11/15  Yes Leandrew Koyanagi, MD  fluticasone  (FLONASE) 50 MCG/ACT nasal spray Place 2 sprays into both nostrils daily. 01/23/16  Yes [name], MD  ipratropium (ATROVENT) 0.03 % nasal spray Place 2 sprays into both nostrils 2 (two) times daily. 11/11/15  Yes Leandrew Koyanagi, MD  meloxicam (MOBIC) 15 MG tablet TAKE 1 TABLET EVERY DAY 11/11/15  Yes Leandrew Koyanagi, MD  Multiple Vitamin (MULTIVITAMIN) capsule Take 1 capsule by mouth daily.   Yes Historical Provider, MD  tiotropium (SPIRIVA HANDIHALER) 18 MCG inhalation capsule Place 1 capsule (18 mcg total) into inhaler and inhale daily. 11/11/15  Yes Leandrew Koyanagi, MD  traZODone (DESYREL) 50 MG tablet TAKE 1 TABLET AT BEDTIME "NO MORE REFILLS WITHOUT OFFICE VISIT" 02/22/16  Yes Leandrew Koyanagi, MD  albuterol (PROVENTIL HFA;VENTOLIN HFA) 108 (90 Base) MCG/ACT inhaler Inhale 1-2 puffs into the lungs every 6 (six) hours as needed for wheezing or shortness of breath. 03/22/16   Tereasa Coop, PA-C  atomoxetine (STRATTERA) 100 MG capsule TAKE ONE CAPSULE EVERY DAY 03/22/16   Tereasa Coop, PA-C  cefUROXime (CEFTIN) 250 MG tablet Take one tablet by mouth twice daily for 10 days with food Patient not taking: Reported on 03/22/2016 03/05/16   Terrance Mass, MD  losartan-hydrochlorothiazide (HYZAAR) 100-12.5 MG tablet TAKE 1 TABLET BY MOUTH DAILY. 03/22/16   Tereasa Coop, PA-C  rosuvastatin (CRESTOR) 10 MG tablet Take as directed for cholesterol 03/15/16   Jaynee Eagles, PA-C  zoster vaccine live, PF, (ZOSTAVAX) 16109 UNT/0.65ML injection Inject 19,400 Units into the skin once. Administer at pharmacy 11/09/15   Leandrew Koyanagi, MD    Family History History reviewed. No pertinent family history.  Social History Social History  Substance Use Topics  . Smoking status: Former Smoker    Packs/day: 0.50    Years: 49.00    Types: Cigarettes    Start date: 12/08/2015  . Smokeless tobacco: Not on file  . Alcohol use No     Allergies   Macrobid [nitrofurantoin monohyd macro]   Review of  Systems Review of Systems  Musculoskeletal: Positive for arthralgias. Negative for neck pain.  Skin: Positive for wound.  Neurological: Negative for dizziness and headaches.  All other systems reviewed and are negative.    Physical Exam Updated Vital Signs BP 119/79 (BP Location: Left Arm)   Pulse 96   Temp 98 F (36.7 C) (Oral)   Resp 16   Wt 63.5 kg   SpO2 100%   BMI 27.34 kg/m   Physical Exam  HENT:  Head: Normocephalic.  Right Ear: External ear normal.  Left Ear: External ear normal.  Eyes: Pupils are equal, round, and reactive to light.  Neck: Normal range of motion.  Cardiovascular: Normal rate.   Pulmonary/Chest: Effort normal.  Abdominal: Soft.  Musculoskeletal: She exhibits tenderness.  Neurological: She is alert.  Skin: Skin is warm.     Nursing note and vitals reviewed.    ED Treatments / Results  Labs (all labs ordered are listed, but only abnormal results are displayed) Labs Reviewed - No data to display  EKG  EKG Interpretation None       Radiology No results found.  Procedures .Marland KitchenLaceration Repair Date/Time: 03/12/2016 12:39 AM Performed by: Junius Creamer Authorized by: Junius Creamer   Consent:    Consent obtained:  Verbal   Consent given by:  Patient   Risks discussed:  Infection and pain Anesthesia (see MAR for exact dosages):    Anesthesia method:  Local infiltration   Local anesthetic:  Procaine 0.5% WITH epi Laceration details:    Location:  Face   Face location:  Chin   Length (cm):  1   Depth (mm):  4 Repair type:    Repair type:  Simple Pre-procedure details:    Preparation:  Patient was prepped and draped in usual sterile fashion Exploration:    Wound exploration: entire depth of wound probed and visualized     Contaminated: no   Treatment:    Area cleansed with:  Saline   Amount of cleaning:  Standard   Visualized foreign bodies/material removed: no   Skin repair:    Repair method:  Sutures   Suture size:   6-0   Suture material:  Prolene   Suture technique:  Simple interrupted   Number of sutures:  5 Approximation:    Approximation:  Close   Vermilion border: well-aligned   Post-procedure details:    Dressing:  Antibiotic ointment Comments:     4 SQ sutures placed as well with Rapid Vicry 6-0   (including critical care time)  Medications Ordered in ED Medications  lidocaine-EPINEPHrine (XYLOCAINE W/EPI) 2 %-1:100000 (with pres) injection 1.7 mL (1.7 mLs Infiltration Given 03/12/16 0013)     Initial Impression / Assessment and Plan / ED Course  I have reviewed the triage vital signs and the nursing notes.  Pertinent labs & imaging results that were available during my care of the patient were reviewed by me and considered in my medical decision making (see chart for details).  Clinical Course       Final Clinical Impressions(s) / ED Diagnoses   Final diagnoses:  Facial laceration, initial encounter  Contusion  Abrasion    New Prescriptions Discharge Medication List as of 03/12/2016 12:46 AM       Junius Creamer, NP 03/12/16 [id number]    Veryl Speak, MD 03/12/16 0436    Junius Creamer, NP 04/04/16 2214    Junius Creamer, NP 04/04/16 [id number]    Veryl Speak, MD 04/05/16 1005

## 2016-03-12 NOTE — Discharge Instructions (Signed)
The sutures should be removed in 5 days  Your PCP can do this in the office Wash with soap and water daily than apply a small amount of antibiotic ointment to the areas

## 2016-03-13 ENCOUNTER — Other Ambulatory Visit: Payer: Self-pay

## 2016-03-13 MED ORDER — ATOMOXETINE HCL 100 MG PO CAPS: ORAL_CAPSULE | ORAL | 0 refills | Status: DC

## 2016-03-15 ENCOUNTER — Other Ambulatory Visit: Payer: Self-pay

## 2016-03-15 DIAGNOSIS — E785 Hyperlipidemia, unspecified: Secondary | ICD-10-CM

## 2016-03-15 MED ORDER — ROSUVASTATIN CALCIUM 10 MG PO TABS: ORAL_TABLET | ORAL | 1 refills | Status: DC

## 2016-03-22 ENCOUNTER — Ambulatory Visit (INDEPENDENT_AMBULATORY_CARE_PROVIDER_SITE_OTHER): Payer: Medicare Other | Admitting: Physician Assistant

## 2016-03-22 ENCOUNTER — Encounter: Payer: Self-pay | Admitting: Physician Assistant

## 2016-03-22 VITALS — BP 130/92 | HR 103 | Temp 98.0°F | Resp 18 | Ht 60.0 in | Wt 141.0 lb

## 2016-03-22 DIAGNOSIS — S0080XD Unspecified superficial injury of other part of head, subsequent encounter: Secondary | ICD-10-CM | POA: Diagnosis not present

## 2016-03-22 DIAGNOSIS — Z760 Encounter for issue of repeat prescription: Secondary | ICD-10-CM

## 2016-03-22 DIAGNOSIS — J449 Chronic obstructive pulmonary disease, unspecified: Secondary | ICD-10-CM

## 2016-03-22 LAB — CBC
HCT: 36.1 % (ref 35.0–45.0)
Hemoglobin: 12.1 g/dL (ref 11.7–15.5)
MCH: 30.7 pg (ref 27.0–33.0)
MCHC: 33.5 g/dL (ref 32.0–36.0)
MCV: 91.6 fL (ref 80.0–100.0)
MPV: 10.8 fL (ref 7.5–12.5)
Platelets: 412 K/uL — ABNORMAL HIGH (ref 140–400)
RBC: 3.94 MIL/uL (ref 3.80–5.10)
RDW: 13.7 % (ref 11.0–15.0)
WBC: 7.6 K/uL (ref 3.8–10.8)

## 2016-03-22 LAB — TSH: TSH: 0.96 m[IU]/L

## 2016-03-22 MED ORDER — LOSARTAN POTASSIUM-HCTZ 100-12.5 MG PO TABS: ORAL_TABLET | ORAL | 0 refills | Status: DC

## 2016-03-22 MED ORDER — ATOMOXETINE HCL 100 MG PO CAPS: ORAL_CAPSULE | ORAL | 3 refills | Status: DC

## 2016-03-22 MED ORDER — ALBUTEROL SULFATE HFA 108 (90 BASE) MCG/ACT IN AERS: 1.0000 | INHALATION_SPRAY | Freq: Four times a day (QID) | RESPIRATORY_TRACT | 3 refills | Status: DC | PRN

## 2016-03-22 NOTE — Patient Instructions (Addendum)
     IF you received an x-ray today, you will receive an invoice from [facility]. Please contact [facility] at [phone] with questions or concerns regarding your invoice.   IF you received labwork today, you will receive an invoice from [facility]. Please contact [facility] at [phone] with questions or concerns regarding your invoice.   Our billing staff will not be able to assist you with questions regarding bills from these companies.  You will be contacted with the lab results as soon as they are available. The fastest way to get your results is to activate your My Chart account. Instructions are located on the last page of this paperwork. If you have not heard from us regarding the results in 2 weeks, please contact this office.     We recommend that you schedule a mammogram for breast cancer screening. Typically, you do not need a referral to do this. Please contact a local imaging center to schedule your mammogram.  [facility] - [phone]  *ask for the Radiology Department [facility]) - [phone] or [phone]  [facility] - [phone] [facility] - [phone] [facility] - [phone]  *ask for the Radiology Department [facility] - [phone]  *ask for the Radiology Department [facility] - [phone]  *ask for the Mammography Department [facility] - [phone]  

## 2016-03-22 NOTE — Progress Notes (Signed)
   03/22/2016 4:01 PM   DOB: 1950/12/12 / MRN: [id number]  SUBJECTIVE:  Sheryl [name] is a 65 y.o. female presenting for suture removal.  Reports she had this placed about 10 days ago in the ED. States she needs some medication refills today.  She feels well.   She is allergic to macrobid [nitrofurantoin monohyd macro].   She  has a past medical history of Allergy; Arthritis; COPD (chronic obstructive pulmonary disease) ([place]) (12/09/2014); and Osteoporosis.    She  reports that she has quit smoking. Her smoking use included Cigarettes. She started smoking about 3 months ago. She has a 24.50 pack-year smoking history. She does not have any smokeless tobacco history on file. She reports that she does not drink alcohol or use drugs. She  reports that she does not engage in sexual activity. The patient  has a past surgical history that includes Joint replacement and Replacement total knee (2013).  Her family history is not on file.  Review of Systems  Respiratory: Negative for cough.   Cardiovascular: Negative for chest pain.  Genitourinary: Negative for dysuria.  Musculoskeletal: Negative for myalgias.  Skin: Negative for itching and rash.  Neurological: Negative for dizziness and headaches.  Psychiatric/Behavioral: Negative for depression.    The problem list and medications were reviewed and updated by myself where necessary and exist elsewhere in the encounter.   OBJECTIVE:  BP (!) 130/92 (BP Location: Left Arm, Patient Position: Sitting, Cuff Size: Normal)   Pulse (!) 103   Temp 98 F (36.7 C) (Oral)   Resp 18   Ht 5' (1.524 m)   Wt 141 lb (64 kg)   SpO2 96%   BMI 27.54 kg/m   Physical Exam  Constitutional: She is oriented to person, place, and time.  HENT:  Head:    Cardiovascular: Normal rate and regular rhythm.   Pulmonary/Chest: Effort normal and breath sounds normal.  Abdominal: Soft. Bowel sounds are normal.  Neurological: She is alert and oriented to person,  place, and time.  Vitals reviewed.   No results found for this or any previous visit (from the past 72 hour(s)).  No results found.  ASSESSMENT AND PLAN  [name] was seen today for medication refill, suture / staple removal and other.  Diagnoses and all orders for this visit:  Chronic obstructive pulmonary disease, unspecified COPD type ([place]) -     albuterol (PROVENTIL HFA;VENTOLIN HFA) 108 (90 Base) MCG/ACT inhaler; Inhale 1-2 puffs into the lungs every 6 (six) hours as needed for wheezing or shortness of breath. -     Ambulatory referral to Pulmonology  Medication refill -     atomoxetine (STRATTERA) 100 MG capsule; TAKE ONE CAPSULE EVERY DAY -     losartan-hydrochlorothiazide (HYZAAR) 100-12.5 MG tablet; TAKE 1 TABLET BY MOUTH DAILY. -     CBC -     TSH -     Basic metabolic panel    The patient is advised to call or return to clinic if she does not see an improvement in symptoms, or to seek the care of the closest emergency department if she worsens with the above plan.   Philis Fendt, MHS, PA-C [facility] 03/22/2016 4:01 PM

## 2016-03-23 LAB — BASIC METABOLIC PANEL
BUN: 18 mg/dL (ref 7–25)
CALCIUM: 9.5 mg/dL (ref 8.6–10.4)
CO2: 25 mmol/L (ref 20–31)
Chloride: 105 mmol/L (ref 98–110)
Creat: 1.16 mg/dL — ABNORMAL HIGH (ref 0.50–0.99)
GLUCOSE: 99 mg/dL (ref 65–99)
POTASSIUM: 4.6 mmol/L (ref 3.5–5.3)
SODIUM: 140 mmol/L (ref 135–146)

## 2016-03-26 ENCOUNTER — Ambulatory Visit (INDEPENDENT_AMBULATORY_CARE_PROVIDER_SITE_OTHER): Payer: Medicare Other | Admitting: Gynecology

## 2016-03-26 ENCOUNTER — Encounter: Payer: Self-pay | Admitting: Gynecology

## 2016-03-26 VITALS — BP 120/78

## 2016-03-26 DIAGNOSIS — IMO0002 Reserved for concepts with insufficient information to code with codable children: Secondary | ICD-10-CM

## 2016-03-26 DIAGNOSIS — R896 Abnormal cytological findings in specimens from other organs, systems and tissues: Secondary | ICD-10-CM

## 2016-03-26 NOTE — Progress Notes (Signed)
  Patient is a 65 year old who presented to the office today for her six-week postop visit. She is status post LEEP cervical conization as a result of her low crit squamous epithelial lesion. Her pathology reported demonstrated the following:  Diagnosis 1. Cervix, LEEP, button - KOILOCYTIC ATYPIA, SEE COMMENT. 2. Cervix, LEEP, 12 o'clock - BENIGN ENDOCERVICAL MUCOSA. Microscopic Comment 1. 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 Some of the foci appear to involve the ectocervical margin of resection. Definite or diagnostic high grade squamous intraepithelial lesion is not appreciated and a p16 stain performed on multiple blocks is negative. Clinical correlation and follow-up is recommended.   Patient is doing well asymptomatic.  Exam: Pelvic: Bartholin urethra Skene was within normal limit Vagina: No lesions or discharge Cervix: Cervical bed completely healed Bimanual exam not done Rectal exam: Not done  Assessment/plan: Patient status post LEEP cervical conization on June 26 as a result of low-grade squamous intraepithelial lesion with the above-mentioned pathology report reported. Patient doing well. Patient to return back in one year for follow-up Pap smear. Her PCP we'll be doing her exam.

## 2016-04-10 ENCOUNTER — Ambulatory Visit (INDEPENDENT_AMBULATORY_CARE_PROVIDER_SITE_OTHER)
Admission: RE | Admit: 2016-04-10 | Discharge: 2016-04-10 | Disposition: A | Discharge status: Home / Self Care | Payer: Medicare Other | Source: Ambulatory Visit | Attending: Emergency Medicine | Admitting: Emergency Medicine

## 2016-04-10 ENCOUNTER — Ambulatory Visit (INDEPENDENT_AMBULATORY_CARE_PROVIDER_SITE_OTHER): Payer: Medicare Other | Admitting: Emergency Medicine

## 2016-04-10 ENCOUNTER — Encounter: Payer: Self-pay | Admitting: Emergency Medicine

## 2016-04-10 VITALS — BP 116/82 | HR 102 | Ht 61.0 in | Wt 140.0 lb

## 2016-04-10 DIAGNOSIS — F17210 Nicotine dependence, cigarettes, uncomplicated: Secondary | ICD-10-CM | POA: Diagnosis not present

## 2016-04-10 DIAGNOSIS — J449 Chronic obstructive pulmonary disease, unspecified: Secondary | ICD-10-CM

## 2016-04-10 NOTE — Patient Instructions (Addendum)
Please work hard on stopping again.  Continue your Spiriva daily Take albuterol 2 puffs up to every 4 hours if needed for shortness of breath.  We will perform full pulmonary function testing at your next office visit.  Walking oximetry today CXR today.  Get the flu shot this Fall Your pneumovax and prevnar pneumonia shots are up to date.  Follow with Dr Lamonte Sakai next available with full PFT.

## 2016-04-10 NOTE — Assessment & Plan Note (Signed)
Discussed smoking cessation with her today. She is going to continue to work on this. Techniques for success discussed

## 2016-04-10 NOTE — Progress Notes (Signed)
Subjective:    Patient ID: Sheryl [name], female    DOB: 12-17-1950, 65 y.o.   MRN: [id number]  HPI 65 year old smoker (25 pack years) with a history of Hypertension, Allergic rhinitis and osteoporosis. She carries a dx of COPD made by Dr Carlis Abbott in the setting of an AE. She has been treated for PNA in the past and has been seen for exacerbations at [facility]. Her last one was in June, now back to baseline. She has been on Spiriva reliably since June, feels that it may be helping her. She has albuterol available but doesn't use frequently. She is quite active.   She on fluticasone NS, taking reliably since June.      Review of Systems  Constitutional: Negative for fever and unexpected weight change.  HENT: Positive for congestion, ear pain and sore throat. Negative for dental problem, nosebleeds, postnasal drip, rhinorrhea, sinus pressure, sneezing and trouble swallowing.   Eyes: Negative for redness and itching.  Respiratory: Positive for cough and shortness of breath. Negative for chest tightness and wheezing.   Cardiovascular: Positive for chest pain. Negative for palpitations and leg swelling.  Gastrointestinal: Negative for nausea and vomiting.  Genitourinary: Negative for dysuria.  Musculoskeletal: Negative for joint swelling.  Skin: Negative for rash.  Neurological: Negative for headaches.  Hematological: Does not bruise/bleed easily.  Psychiatric/Behavioral: Negative for dysphoric mood. The patient is not nervous/anxious.    Past Medical History:  Diagnosis Date  . Allergy   . Arthritis   . COPD (chronic obstructive pulmonary disease) ([place]) 12/09/2014  . LGSIL (low grade squamous intraepithelial dysplasia)   . Osteoporosis      No family history on file.   Social History   Social History  . Marital status: Divorced    Spouse name: N/A  . Number of children: N/A  . Years of education: N/A   Occupational History  . Not on file.   Social History Main Topics    . Smoking status: Current Every Day Smoker    Packs/day: 0.50    Years: 49.00    Types: Cigarettes    Start date: 12/08/2015  . Smokeless tobacco: Never Used  . Alcohol use No  . Drug use: No  . Sexual activity: No   Other Topics Concern  . Not on file   Social History Narrative  . No narrative on file     Allergies  Allergen Reactions  . Macrobid [Nitrofurantoin Monohyd Macro]     GI upset     Outpatient Medications Prior to Visit  Medication Sig Dispense Refill  . albuterol (PROVENTIL HFA;VENTOLIN HFA) 108 (90 Base) MCG/ACT inhaler Inhale 1-2 puffs into the lungs every 6 (six) hours as needed for wheezing or shortness of breath. 1 Inhaler 3  . atomoxetine (STRATTERA) 100 MG capsule TAKE ONE CAPSULE EVERY DAY 90 capsule 3  . clobetasol cream (TEMOVATE) 0.05 % Apply topically 2 (two) times daily. 30 g 5  . Estradiol 10 MCG TABS vaginal tablet Use 2x/ weekly for vaginal atrophy 8 tablet 11  . FLUoxetine (PROZAC) 40 MG capsule Take 1 capsule (40 mg total) by mouth daily. 90 capsule 3  . fluticasone (FLONASE) 50 MCG/ACT nasal spray Place 2 sprays into both nostrils daily. 16 g 6  . losartan-hydrochlorothiazide (HYZAAR) 100-12.5 MG tablet TAKE 1 TABLET BY MOUTH DAILY. 30 tablet 0  . meloxicam (MOBIC) 15 MG tablet TAKE 1 TABLET EVERY DAY 90 tablet 3  . Multiple Vitamin (MULTIVITAMIN) capsule Take 1 capsule by mouth  daily.    . rosuvastatin (CRESTOR) 10 MG tablet Take as directed for cholesterol 30 tablet 1  . tiotropium (SPIRIVA HANDIHALER) 18 MCG inhalation capsule Place 1 capsule (18 mcg total) into inhaler and inhale daily. 30 capsule 12  . traZODone (DESYREL) 50 MG tablet TAKE 1 TABLET AT BEDTIME "NO MORE REFILLS WITHOUT OFFICE VISIT" 90 tablet 0  . ipratropium (ATROVENT) 0.03 % nasal spray Place 2 sprays into both nostrils 2 (two) times daily. (Patient not taking: Reported on 04/10/2016) 30 mL 0  . cefUROXime (CEFTIN) 250 MG tablet Take one tablet by mouth twice daily for 10  days with food (Patient not taking: Reported on 03/22/2016) 20 tablet 0  . zoster vaccine live, PF, (ZOSTAVAX) 16109 UNT/0.65ML injection Inject 19,400 Units into the skin once. Administer at pharmacy 1 each 0   No facility-administered medications prior to visit.         Objective:   Physical Exam Vitals:   04/10/16 1112 04/10/16 1113  BP:  116/82  Pulse:  (!) 102  SpO2:  96%  Weight: 140 lb (63.5 kg)   Height: 5\' 1"  (1.549 m)    Gen: Pleasant, well-nourished, in no distress,  normal affect  ENT: No lesions,  mouth clear,  oropharynx clear, no postnasal drip  Neck: No JVD, no TMG, no carotid bruits  Lungs: No use of accessory muscles, clear without rales or rhonchi  Cardiovascular: RRR, heart sounds normal, no murmur or gallops, no peripheral edema  Musculoskeletal: No deformities, no cyanosis or clubbing  Neuro: alert, non focal  Skin: Warm, no lesions or rashes       Assessment & Plan:  COPD (chronic obstructive pulmonary disease) Please work hard on stopping again.  Continue your Spiriva daily Take albuterol 2 puffs up to every 4 hours if needed for shortness of breath.  We will perform full pulmonary function testing at your next office visit.  Walking oximetry today CXR today.  Get the flu shot this Fall Your pneumovax and prevnar pneumonia shots are up to date.  Follow with Dr Lamonte Sakai next available with full PFT.    Nicotine addiction Discussed smoking cessation with her today. She is going to continue to work on this. Techniques for success discussed  Baltazar Apo, MD, PhD 04/10/2016, 11:49 AM [facility] [phone] or if no answer [phone]

## 2016-04-10 NOTE — Assessment & Plan Note (Signed)
Please work hard on stopping again.  Continue your Spiriva daily Take albuterol 2 puffs up to every 4 hours if needed for shortness of breath.  We will perform full pulmonary function testing at your next office visit.  Walking oximetry today CXR today.  Get the flu shot this Fall Your pneumovax and prevnar pneumonia shots are up to date.  Follow with Dr Lamonte Sakai next available with full PFT.

## 2016-06-08 ENCOUNTER — Ambulatory Visit: Payer: Medicare Other | Admitting: Emergency Medicine

## 2016-06-11 ENCOUNTER — Other Ambulatory Visit: Payer: Self-pay | Admitting: Urgent Care

## 2016-06-11 DIAGNOSIS — E785 Hyperlipidemia, unspecified: Secondary | ICD-10-CM

## 2016-06-23 ENCOUNTER — Other Ambulatory Visit: Payer: Self-pay

## 2016-06-23 NOTE — Telephone Encounter (Signed)
03/2016 last ov and labs Pharmacy requests 90 day supply of crestor. 30 day given 06/12/16 Advised ov and lab needed for more

## 2016-06-29 ENCOUNTER — Encounter: Payer: Self-pay | Admitting: Internal Medicine

## 2016-06-29 ENCOUNTER — Ambulatory Visit (INDEPENDENT_AMBULATORY_CARE_PROVIDER_SITE_OTHER): Payer: Medicare Other | Admitting: Internal Medicine

## 2016-06-29 VITALS — BP 146/86 | HR 100 | Temp 98.3°F | Resp 16 | Ht 61.0 in | Wt 145.0 lb

## 2016-06-29 DIAGNOSIS — R7303 Prediabetes: Secondary | ICD-10-CM

## 2016-06-29 DIAGNOSIS — F988 Other specified behavioral and emotional disorders with onset usually occurring in childhood and adolescence: Secondary | ICD-10-CM

## 2016-06-29 DIAGNOSIS — E7800 Pure hypercholesterolemia, unspecified: Secondary | ICD-10-CM | POA: Diagnosis not present

## 2016-06-29 DIAGNOSIS — J449 Chronic obstructive pulmonary disease, unspecified: Secondary | ICD-10-CM

## 2016-06-29 DIAGNOSIS — F329 Major depressive disorder, single episode, unspecified: Secondary | ICD-10-CM | POA: Diagnosis not present

## 2016-06-29 DIAGNOSIS — I10 Essential (primary) hypertension: Secondary | ICD-10-CM

## 2016-06-29 DIAGNOSIS — F32A Depression, unspecified: Secondary | ICD-10-CM

## 2016-06-29 DIAGNOSIS — Z23 Encounter for immunization: Secondary | ICD-10-CM

## 2016-06-29 DIAGNOSIS — G4700 Insomnia, unspecified: Secondary | ICD-10-CM

## 2016-06-29 MED ORDER — TRAZODONE HCL 50 MG PO TABS: ORAL_TABLET | ORAL | 5 refills | Status: DC

## 2016-06-29 NOTE — Assessment & Plan Note (Signed)
Controlled, stable Continue current dose of medication  

## 2016-06-29 NOTE — Progress Notes (Signed)
Pre visit review using our clinic review tool, if applicable. No additional management support is needed unless otherwise documented below in the visit note. 

## 2016-06-29 NOTE — Assessment & Plan Note (Addendum)
Taking crestor daily Continue Will check lipid, cmp at next visit

## 2016-06-29 NOTE — Assessment & Plan Note (Signed)
Taking trazodone nightly Controlled, will continue

## 2016-06-29 NOTE — Assessment & Plan Note (Addendum)
Taking strattera Continue current dose

## 2016-06-29 NOTE — Assessment & Plan Note (Signed)
BP well controlled Current regimen effective and well tolerated Continue current medications at current doses  
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
## 2016-06-29 NOTE — Assessment & Plan Note (Signed)
Lab Results  Component Value Date   HGBA1C 6.0 02/14/2016    Low sugar/carb, regular exercise and weight loss Recheck a1c in a few months

## 2016-06-29 NOTE — Patient Instructions (Addendum)
  It was nice to meet you.   flu immunization administered today.   Medications reviewed and updated.  No changes recommended at this time.  Your prescription(s) have been submitted to your pharmacy. Please take as directed and contact our office if you believe you are having problem(s) with the medication(s).   Please followup in 6 months

## 2016-07-11 ENCOUNTER — Ambulatory Visit (INDEPENDENT_AMBULATORY_CARE_PROVIDER_SITE_OTHER): Payer: Medicare Other | Admitting: Emergency Medicine

## 2016-07-11 ENCOUNTER — Encounter: Payer: Self-pay | Admitting: Emergency Medicine

## 2016-07-11 VITALS — BP 110/70 | HR 100 | Ht 60.5 in | Wt 141.0 lb

## 2016-07-11 DIAGNOSIS — J449 Chronic obstructive pulmonary disease, unspecified: Secondary | ICD-10-CM

## 2016-07-11 LAB — PULMONARY FUNCTION TEST
DL/VA % PRED: 77 %
DL/VA: 3.33 ml/min/mmHg/L
DLCO cor % pred: 59 %
DLCO cor: 11.58 ml/min/mmHg
DLCO unc % pred: 55 %
DLCO unc: 10.84 ml/min/mmHg
FEF 25-75 Post: 1.89 L/sec
FEF 25-75 Pre: 1.29 L/sec
FEF2575-%CHANGE-POST: 46 %
FEF2575-%Pred-Post: 99 %
FEF2575-%Pred-Pre: 68 %
FEV1-%CHANGE-POST: 7 %
FEV1-%PRED-PRE: 77 %
FEV1-%Pred-Post: 83 %
FEV1-PRE: 1.6 L
FEV1-Post: 1.73 L
FEV1FVC-%CHANGE-POST: 0 %
FEV1FVC-%Pred-Pre: 99 %
FEV6-%CHANGE-POST: 7 %
FEV6-%PRED-PRE: 80 %
FEV6-%Pred-Post: 85 %
FEV6-PRE: 2.09 L
FEV6-Post: 2.24 L
FEV6FVC-%Change-Post: 0 %
FEV6FVC-%PRED-PRE: 104 %
FEV6FVC-%Pred-Post: 104 %
FVC-%Change-Post: 6 %
FVC-%PRED-POST: 82 %
FVC-%PRED-PRE: 77 %
FVC-POST: 2.24 L
FVC-PRE: 2.09 L
POST FEV6/FVC RATIO: 100 %
PRE FEV6/FVC RATIO: 100 %
Post FEV1/FVC ratio: 77 %
Pre FEV1/FVC ratio: 77 %
RV % PRED: 74 %
RV: 1.43 L
TLC % pred: 83 %
TLC: 3.78 L

## 2016-07-11 NOTE — Progress Notes (Signed)
Subjective:    Patient ID: Sheryl [name], female    DOB: [birth year], 65 y.o.   MRN: [id number]  HPI 65 year old smoker (25 pack years) with a history of Hypertension, Allergic rhinitis and osteoporosis. She carries a dx of COPD made by Dr Carlis Abbott in the setting of an AE. She has been treated for PNA in the past and has been seen for exacerbations at [facility]. Her last one was in June, now back to baseline. She has been on Spiriva reliably since June, feels that it may be helping her. She has albuterol available but doesn't use frequently. She is quite active.   She on fluticasone NS, taking reliably since June.   ROV 07/11/16 -- his is a follow-up visit for tobacco use, dyspnea, obstructive lung disease.  She underwent pulmonary function testing today that O have reviewed > spirometry with possible mixed disease, only moderate obstruction if present. Slightly decreased RV, normal TLC, decreased diffusion capacity that does not fully correct to normal for alveolar volume. FEV1 77% predicted. She has been managed on Spiriva. No desaturation on walking last visit.  She is interested in stopping smoking, currently on 10 cig a day. No flares reported.    Review of Systems  Constitutional: Negative for fever and unexpected weight change.  HENT: Positive for congestion, ear pain and sore throat. Negative for dental problem, nosebleeds, postnasal drip, rhinorrhea, sinus pressure, sneezing and trouble swallowing.   Eyes: Negative for redness and itching.  Respiratory: Positive for cough and shortness of breath. Negative for chest tightness and wheezing.   Cardiovascular: Positive for chest pain. Negative for palpitations and leg swelling.  Gastrointestinal: Negative for nausea and vomiting.  Genitourinary: Negative for dysuria.  Musculoskeletal: Negative for joint swelling.  Skin: Negative for rash.  Neurological: Negative for headaches.  Hematological: Does not bruise/bleed easily.    Psychiatric/Behavioral: Negative for dysphoric mood. The patient is not nervous/anxious.    Past Medical History:  Diagnosis Date  . Allergy   . Arthritis   . COPD (chronic obstructive pulmonary disease) ([place]) 12/09/2014  . Hyperlipidemia   . LGSIL (low grade squamous intraepithelial dysplasia)   . Osteoporosis      Family History  Problem Relation Age of Onset  . Hyperlipidemia Mother   . Alcohol abuse Father   . Arthritis Father   . Alcohol abuse Maternal Uncle   . Stroke Maternal Grandmother      Social History   Social History  . Marital status: Divorced    Spouse name: N/A  . Number of children: N/A  . Years of education: N/A   Occupational History  . Not on file.   Social History Main Topics  . Smoking status: Current Every Day Smoker    Packs/day: 0.50    Years: 49.00    Types: Cigarettes    Start date: 12/08/2015  . Smokeless tobacco: Never Used  . Alcohol use No  . Drug use: No  . Sexual activity: No   Other Topics Concern  . Not on file   Social History Narrative  . No narrative on file     Allergies  Allergen Reactions  . Macrobid [Nitrofurantoin Monohyd Macro]     GI upset     Outpatient Medications Prior to Visit  Medication Sig Dispense Refill  . albuterol (PROVENTIL HFA;VENTOLIN HFA) 108 (90 Base) MCG/ACT inhaler Inhale 1-2 puffs into the lungs every 6 (six) hours as needed for wheezing or shortness of breath. 1 Inhaler 3  . atomoxetine (  STRATTERA) 100 MG capsule TAKE ONE CAPSULE EVERY DAY 90 capsule 3  . clobetasol cream (TEMOVATE) 0.05 % Apply topically 2 (two) times daily. 30 g 5  . Estradiol 10 MCG TABS vaginal tablet Use 2x/ weekly for vaginal atrophy 8 tablet 11  . FLUoxetine (PROZAC) 40 MG capsule Take 1 capsule (40 mg total) by mouth daily. 90 capsule 3  . fluticasone (FLONASE) 50 MCG/ACT nasal spray Place 2 sprays into both nostrils daily. 16 g 6  . ipratropium (ATROVENT) 0.03 % nasal spray Place 2 sprays into both nostrils 2  (two) times daily. 30 mL 0  . losartan-hydrochlorothiazide (HYZAAR) 100-12.5 MG tablet TAKE 1 TABLET BY MOUTH DAILY. 30 tablet 0  . meloxicam (MOBIC) 15 MG tablet TAKE 1 TABLET EVERY DAY 90 tablet 3  . Multiple Vitamin (MULTIVITAMIN) capsule Take 1 capsule by mouth daily.    . rosuvastatin (CRESTOR) 10 MG tablet Take 1 tablet (10 mg total) by mouth daily. Take as directed for cholesterol 30 tablet 0  . tiotropium (SPIRIVA HANDIHALER) 18 MCG inhalation capsule Place 1 capsule (18 mcg total) into inhaler and inhale daily. 30 capsule 12  . traZODone (DESYREL) 50 MG tablet TAKE 1 TABLET AT BEDTIME 90 tablet 5   No facility-administered medications prior to visit.         Objective:   Physical Exam Vitals:   07/11/16 1013 07/11/16 1015  BP:  110/70  Pulse:  100  SpO2:  93%  Weight: 141 lb (64 kg)   Height: 5' 0.5" (1.537 m)    Gen: Pleasant, well-nourished, in no distress,  normal affect  ENT: No lesions,  mouth clear,  oropharynx clear, no postnasal drip  Neck: No JVD, no TMG, no carotid bruits  Lungs: No use of accessory muscles, clear without rales or rhonchi  Cardiovascular: RRR, heart sounds normal, no murmur or gallops, no peripheral edema  Musculoskeletal: No deformities, no cyanosis or clubbing  Neuro: alert, non focal  Skin: Warm, no lesions or rashes       Assessment & Plan:  Nicotine addiction Discussed cessation with her in detail today. She is  Interested in stopping smoking. We made a plan to go to 5 cig daily by next visit. Once we accomplish this we will work on going to 0, possibly w assist of meds.   COPD (chronic obstructive pulmonary disease) Moderate mixed disease on spirometry. Has been on spiriva but unclear if benefiting. Will do a trial Anoro to see if she gets any added benefit.   We will temporarily stop Spiriva and start Anoro once a day for 3-4 weeks to see if you get more benefit.  If the Anoro helps then we will change to this.  Try to get  your cigarettes down to 5 cigarettes a day by our next visit. Once we accomplish this we will work on stopping altogether.  Take albuterol 2 puffs up to every 4 hours if needed for shortness of breath. Follow with Dr Lamonte Sakai in 2 months or sooner if you have any problems.  Baltazar Apo, MD, PhD 07/11/2016, 10:48 AM [facility] [phone] or if no answer [phone]

## 2016-07-11 NOTE — Assessment & Plan Note (Signed)
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 Has been on spiriva but unclear if benefiting. Will do a trial Anoro to see if she gets any added benefit.   We will temporarily stop Spiriva and start Anoro once a day for 3-4 weeks to see if you get more benefit.  If the Anoro helps then we will change to this.  Try to get your cigarettes down to 5 cigarettes a day by our next visit. Once we accomplish this we will work on stopping altogether.  Take albuterol 2 puffs up to every 4 hours if needed for shortness of breath. Follow with Dr Lamonte Sakai in 2 months or sooner if you have any problems.

## 2016-07-11 NOTE — Assessment & Plan Note (Signed)
Discussed cessation with her in detail today. She is  Interested in stopping smoking. We made a plan to go to 5 cig daily by next visit. Once we accomplish this we will work on going to 0, possibly w assist of meds.

## 2016-07-11 NOTE — Patient Instructions (Addendum)
We will temporarily stop Spiriva and start Anoro once a day for 3-4 weeks to see if you get more benefit.  If the Anoro helps then we will change to this.  Try to get your cigarettes down to 5 cigarettes a day by our next visit. Once we accomplish this we will work on stopping altogether.  Take albuterol 2 puffs up to every 4 hours if needed for shortness of breath. Follow with Dr Lamonte Sakai in 2 months or sooner if you have any problems.

## 2016-07-20 ENCOUNTER — Other Ambulatory Visit: Payer: Self-pay | Admitting: Internal Medicine

## 2016-07-20 DIAGNOSIS — E785 Hyperlipidemia, unspecified: Secondary | ICD-10-CM

## 2016-09-10 ENCOUNTER — Encounter: Payer: Self-pay | Admitting: Emergency Medicine

## 2016-09-10 ENCOUNTER — Ambulatory Visit (INDEPENDENT_AMBULATORY_CARE_PROVIDER_SITE_OTHER): Payer: Medicare Other | Admitting: Emergency Medicine

## 2016-09-10 VITALS — BP 122/82 | HR 104 | Ht 61.0 in | Wt 143.0 lb

## 2016-09-10 DIAGNOSIS — J449 Chronic obstructive pulmonary disease, unspecified: Secondary | ICD-10-CM

## 2016-09-10 MED ORDER — UMECLIDINIUM-VILANTEROL 62.5-25 MCG/INH IN AEPB: 1.0000 | INHALATION_SPRAY | Freq: Every day | RESPIRATORY_TRACT | 0 refills | Status: DC

## 2016-09-10 NOTE — Patient Instructions (Signed)
We will stop Spiriva now Start Anoro once a day Take albuterol 2 puffs up to every 4 hours if needed for shortness of breath.  Continue to work hard on stopping smoking  Follow with Dr Lamonte Sakai in 6 months or sooner if you have any problems

## 2016-09-10 NOTE — Assessment & Plan Note (Signed)
We will stop Spiriva now Start Anoro once a day Take albuterol 2 puffs up to every 4 hours if needed for shortness of breath.  Continue to work hard on stopping smoking  Follow with Dr Lamonte Sakai in 6 months or sooner if you have any problems

## 2016-09-10 NOTE — Progress Notes (Signed)
Subjective:    Patient ID: Sheryl [name], female    DOB: Jan 08, 1951, 66 y.o.   MRN: [id number]  HPI 66 year old smoker (25 pack years) with a history of Hypertension, Allergic rhinitis and osteoporosis. She carries a dx of COPD made by Dr Carlis Abbott in the setting of an AE. She has been treated for PNA in the past and has been seen for exacerbations at [facility]. Her last one was in June, now back to baseline. She has been on Spiriva reliably since June, feels that it may be helping her. She has albuterol available but doesn't use frequently. She is quite active.   She on fluticasone NS, taking reliably since June.   ROV 07/11/16 -- his is a follow-up visit for tobacco use, dyspnea, obstructive lung disease.  She underwent pulmonary function testing today that O have reviewed > spirometry with possible mixed disease, only moderate obstruction if present. Slightly decreased RV, normal TLC, decreased diffusion capacity that does not fully correct to normal for alveolar volume. FEV1 77% predicted. She has been managed on Spiriva. No desaturation on walking last visit.  She is interested in stopping smoking, currently on 10 cig a day. No flares reported.   ROV 09/10/16 -- this is a follow up visit for COPD and continued tobacco use. She is currently smoking approximately 8 cigarattes a day; she had actually quit for 6 days but then restarted. She has done chantix in the past - had some nausea, had some weird dreams. At last visit we did a trial changing Spiriva to Anoro to see if she would benefit > she did this and preferred the Anoro. She would like to start this now. No flares since last time    Review of Systems  Constitutional: Negative for fever and unexpected weight change.  HENT: Positive for congestion, ear pain and sore throat. Negative for dental problem, nosebleeds, postnasal drip, rhinorrhea, sinus pressure, sneezing and trouble swallowing.   Eyes: Negative for redness and itching.    Respiratory: Positive for cough and shortness of breath. Negative for chest tightness and wheezing.   Cardiovascular: Positive for chest pain. Negative for palpitations and leg swelling.  Gastrointestinal: Negative for nausea and vomiting.  Genitourinary: Negative for dysuria.  Musculoskeletal: Negative for joint swelling.  Skin: Negative for rash.  Neurological: Negative for headaches.  Hematological: Does not bruise/bleed easily.  Psychiatric/Behavioral: Negative for dysphoric mood. The patient is not nervous/anxious.    Past Medical History:  Diagnosis Date  . Allergy   . Arthritis   . COPD (chronic obstructive pulmonary disease) ([place]) 12/09/2014  . Hyperlipidemia   . LGSIL (low grade squamous intraepithelial dysplasia)   . Osteoporosis      Family History  Problem Relation Age of Onset  . Hyperlipidemia Mother   . Alcohol abuse Father   . Arthritis Father   . Alcohol abuse Maternal Uncle   . Stroke Maternal Grandmother      Social History   Social History  . Marital status: Divorced    Spouse name: N/A  . Number of children: N/A  . Years of education: N/A   Occupational History  . Not on file.   Social History Main Topics  . Smoking status: Current Every Day Smoker    Packs/day: 0.50    Years: 49.00    Types: Cigarettes    Start date: 12/08/2015  . Smokeless tobacco: Never Used  . Alcohol use No  . Drug use: No  . Sexual activity: No  Other Topics Concern  . Not on file   Social History Narrative  . No narrative on file     Allergies  Allergen Reactions  . Macrobid [Nitrofurantoin Monohyd Macro]     GI upset     Outpatient Medications Prior to Visit  Medication Sig Dispense Refill  . albuterol (PROVENTIL HFA;VENTOLIN HFA) 108 (90 Base) MCG/ACT inhaler Inhale 1-2 puffs into the lungs every 6 (six) hours as needed for wheezing or shortness of breath. 1 Inhaler 3  . atomoxetine (STRATTERA) 100 MG capsule TAKE ONE CAPSULE EVERY DAY 90 capsule 3  .  clobetasol cream (TEMOVATE) 0.05 % Apply topically 2 (two) times daily. 30 g 5  . Estradiol 10 MCG TABS vaginal tablet Use 2x/ weekly for vaginal atrophy 8 tablet 11  . FLUoxetine (PROZAC) 40 MG capsule Take 1 capsule (40 mg total) by mouth daily. 90 capsule 3  . fluticasone (FLONASE) 50 MCG/ACT nasal spray Place 2 sprays into both nostrils daily. 16 g 6  . ipratropium (ATROVENT) 0.03 % nasal spray Place 2 sprays into both nostrils 2 (two) times daily. 30 mL 0  . losartan-hydrochlorothiazide (HYZAAR) 100-12.5 MG tablet TAKE 1 TABLET BY MOUTH DAILY. 30 tablet 0  . meloxicam (MOBIC) 15 MG tablet TAKE 1 TABLET EVERY DAY 90 tablet 3  . Multiple Vitamin (MULTIVITAMIN) capsule Take 1 capsule by mouth daily.    . rosuvastatin (CRESTOR) 10 MG tablet Take 1 tablet by mouth daily. 90 tablet 1  . tiotropium (SPIRIVA HANDIHALER) 18 MCG inhalation capsule Place 1 capsule (18 mcg total) into inhaler and inhale daily. 30 capsule 12  . traZODone (DESYREL) 50 MG tablet TAKE 1 TABLET AT BEDTIME 90 tablet 5   No facility-administered medications prior to visit.         Objective:   Physical Exam Vitals:   09/10/16 0856  BP: 122/82  Pulse: (!) 104  SpO2: 95%  Weight: 143 lb (64.9 kg)  Height: 5\' 1"  (1.549 m)   Gen: Pleasant, well-nourished, in no distress,  normal affect  ENT: No lesions,  mouth clear,  oropharynx clear, no postnasal drip  Neck: No JVD, no TMG, no carotid bruits  Lungs: No use of accessory muscles, clear without rales or rhonchi  Cardiovascular: RRR, heart sounds normal, no murmur or gallops, no peripheral edema  Musculoskeletal: No deformities, no cyanosis or clubbing  Neuro: alert, non focal  Skin: Warm, no lesions or rashes       Assessment & Plan:  COPD (chronic obstructive pulmonary disease) We will stop Spiriva now Start Anoro once a day Take albuterol 2 puffs up to every 4 hours if needed for shortness of breath.  Continue to work hard on stopping smoking    Follow with Dr Lamonte Sakai in 6 months or sooner if you have any problems  Baltazar Apo, MD, PhD 09/10/2016, 9:22 AM  [facility] [phone] or if no answer [phone]

## 2016-11-16 ENCOUNTER — Other Ambulatory Visit: Payer: Self-pay | Admitting: Internal Medicine

## 2016-12-13 ENCOUNTER — Other Ambulatory Visit: Payer: Self-pay | Admitting: Physician Assistant

## 2016-12-27 ENCOUNTER — Encounter: Payer: Self-pay | Admitting: Internal Medicine

## 2016-12-27 ENCOUNTER — Other Ambulatory Visit (INDEPENDENT_AMBULATORY_CARE_PROVIDER_SITE_OTHER): Payer: Medicare Other

## 2016-12-27 ENCOUNTER — Other Ambulatory Visit: Payer: Self-pay | Admitting: Internal Medicine

## 2016-12-27 ENCOUNTER — Ambulatory Visit (INDEPENDENT_AMBULATORY_CARE_PROVIDER_SITE_OTHER): Payer: Medicare Other | Admitting: Internal Medicine

## 2016-12-27 VITALS — BP 122/78 | HR 97 | Temp 98.0°F | Resp 16 | Wt 142.0 lb

## 2016-12-27 DIAGNOSIS — R7303 Prediabetes: Secondary | ICD-10-CM | POA: Diagnosis not present

## 2016-12-27 DIAGNOSIS — Z760 Encounter for issue of repeat prescription: Secondary | ICD-10-CM

## 2016-12-27 DIAGNOSIS — F329 Major depressive disorder, single episode, unspecified: Secondary | ICD-10-CM

## 2016-12-27 DIAGNOSIS — N1831 Chronic kidney disease, stage 3a: Secondary | ICD-10-CM | POA: Insufficient documentation

## 2016-12-27 DIAGNOSIS — I10 Essential (primary) hypertension: Secondary | ICD-10-CM | POA: Diagnosis not present

## 2016-12-27 DIAGNOSIS — E7800 Pure hypercholesterolemia, unspecified: Secondary | ICD-10-CM

## 2016-12-27 DIAGNOSIS — F17210 Nicotine dependence, cigarettes, uncomplicated: Secondary | ICD-10-CM | POA: Diagnosis not present

## 2016-12-27 DIAGNOSIS — F32A Depression, unspecified: Secondary | ICD-10-CM

## 2016-12-27 DIAGNOSIS — N189 Chronic kidney disease, unspecified: Secondary | ICD-10-CM | POA: Insufficient documentation

## 2016-12-27 LAB — LDL CHOLESTEROL, DIRECT: LDL DIRECT: 100 mg/dL

## 2016-12-27 LAB — LIPID PANEL
CHOLESTEROL: 184 mg/dL (ref 0–200)
HDL: 47.2 mg/dL (ref >39.00)
NonHDL: 136.58
TRIGLYCERIDES: 217 mg/dL — AB (ref 0.0–149.0)
Total CHOL/HDL Ratio: 4
VLDL: 43.4 mg/dL — ABNORMAL HIGH (ref 0.0–40.0)

## 2016-12-27 LAB — COMPREHENSIVE METABOLIC PANEL
ALK PHOS: 135 U/L — AB (ref 39–117)
ALT: 35 U/L (ref 0–35)
AST: 28 U/L (ref 0–37)
Albumin: 4.2 g/dL (ref 3.5–5.2)
BILIRUBIN TOTAL: 0.4 mg/dL (ref 0.2–1.2)
BUN: 30 mg/dL — AB (ref 6–23)
CHLORIDE: 104 meq/L (ref 96–112)
CO2: 24 meq/L (ref 19–32)
Calcium: 9.8 mg/dL (ref 8.4–10.5)
Creatinine, Ser: 1.41 mg/dL — ABNORMAL HIGH (ref 0.40–1.20)
GFR: 39.61 mL/min — ABNORMAL LOW (ref >60.00)
Glucose, Bld: 87 mg/dL (ref 70–99)
POTASSIUM: 4.7 meq/L (ref 3.5–5.1)
SODIUM: 135 meq/L (ref 135–145)
Total Protein: 8 g/dL (ref 6.0–8.3)

## 2016-12-27 LAB — HEMOGLOBIN A1C: Hgb A1c MFr Bld: 5.8 % (ref 4.6–6.5)

## 2016-12-27 MED ORDER — LOSARTAN POTASSIUM-HCTZ 100-12.5 MG PO TABS: ORAL_TABLET | ORAL | 1 refills | Status: DC

## 2016-12-27 MED ORDER — FLUOXETINE HCL 40 MG PO CAPS: 40.0000 mg | ORAL_CAPSULE | Freq: Every day | ORAL | 1 refills | Status: DC

## 2016-12-27 MED ORDER — FLUOXETINE HCL 20 MG PO TABS: 20.0000 mg | ORAL_TABLET | Freq: Every day | ORAL | 1 refills | Status: DC

## 2016-12-27 MED ORDER — VARENICLINE TARTRATE 0.5 MG X 11 & 1 MG X 42 PO MISC: ORAL | 0 refills | Status: DC

## 2016-12-27 MED ORDER — TRAZODONE HCL 50 MG PO TABS: ORAL_TABLET | ORAL | 3 refills | Status: DC

## 2016-12-27 MED ORDER — ROSUVASTATIN CALCIUM 10 MG PO TABS: ORAL_TABLET | ORAL | 1 refills | Status: DC

## 2016-12-27 NOTE — Assessment & Plan Note (Addendum)
Stressed smoking cessation. She agrees she needs to quit and understands all the reasons she needs to quit Discussed treatment options including nicotine replacement, medication and prayer She would like to try praying again-this did work in the past If this is not successful she will try Chantix. She did take this in the past and had nausea and vivid dreams. She thinks the medication may be tolerable and is interested in trying it again Prescription given for a starter pack  Within 3 minutes were spent for smoking cessation instruction/counseling:  counseled patient on the dangers of tobacco use, advised patient to stop smoking, and reviewed strategies to maximize success.

## 2016-12-27 NOTE — Assessment & Plan Note (Signed)
Check a1c Low sugar / carb diet Stressed regular exercise, weight loss  

## 2016-12-27 NOTE — Assessment & Plan Note (Signed)
Check lipid panel  Continue daily statin Regular exercise and healthy diet encouraged  

## 2016-12-27 NOTE — Patient Instructions (Addendum)
  Test(s) ordered today. Your results will be released to [facility] (or called to you) after review, usually within 72hours after test completion. If any changes need to be made, you will be notified at that same time.   Medications reviewed and updated.  Changes include increasing the prozac to 60 mg daily  Your prescription(s) have been submitted to your pharmacy. Please take as directed and contact our office if you believe you are having problem(s) with the medication(s).    Please followup in 6 months

## 2016-12-27 NOTE — Assessment & Plan Note (Signed)
BP Readings from Last 3 Encounters:  12/27/16 122/78  09/10/16 122/82  07/11/16 110/70    BP well controlled Current regimen effective and well tolerated Continue current medications at current doses cmp

## 2016-12-27 NOTE — Progress Notes (Signed)
Subjective:    Patient ID: Sheryl [name], female    DOB: Feb 25, 1951, 66 y.o.   MRN: [id number]  HPI The patient is here for follow up.  Prediabetes:  She is compliant with a low sugar/carbohydrate diet.  She is exercising regularly.  Hypertension: She is taking her medication daily. She is compliant with a low sodium diet.  She denies chest pain, palpitations, edema, shortness of breath and regular headaches. She is exercising regularly.  She does not monitor her blood pressure at home.    Smoking:  She is smoking 1/2 ppd.  She quit in the past by praying.  She is having SOB when walking as well as coughing and wheezing.  She has taken chantix in the past and did have nausea and vivid dreams.   She has severe pain in her left knee.  She was taking meloxicam daily and it helps.  She will need to have the knee replaced, but first needs to have her teeth removed and implants placed.  Depression: She is taking her medication daily as prescribed. She denies any side effects from the medication. She feels her depression is not well controlled. Her daughter is currently living with her and is not always nice to her. Her daughter got a DUI and needs to stay with her.  She needs help with transportation, saving money so that she can get her own place.  She thinks the situation has gotten better, but does feel more depressed.    Medications and allergies reviewed with patient and updated if appropriate.  Patient Active Problem List   Diagnosis Date Noted  . Insomnia 06/29/2016  . Prediabetes 06/29/2016  . 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 COPD (chronic obstructive pulmonary disease) ([place]) 12/09/2014  . Lumbago 07/16/2014  . Bilateral hand pain 07/16/2014  . Nicotine addiction 06/06/2012  . HTN (hypertension) 06/04/2012  . Hyperlipidemia 06/04/2012  . Depression 06/04/2012  . ADD (attention deficit disorder) 06/04/2012  . AR (allergic rhinitis) 06/04/2012      Current Outpatient Prescriptions on File Prior to Visit  Medication Sig Dispense Refill  . albuterol (PROVENTIL HFA;VENTOLIN HFA) 108 (90 Base) MCG/ACT inhaler Inhale 1-2 puffs into the lungs every 6 (six) hours as needed for wheezing or shortness of breath. 1 Inhaler 3  . atomoxetine (STRATTERA) 100 MG capsule TAKE ONE CAPSULE EVERY DAY 90 capsule 3  . clobetasol cream (TEMOVATE) 0.05 % Apply topically 2 (two) times daily. 30 g 5  . Estradiol 10 MCG TABS vaginal tablet Use 2x/ weekly for vaginal atrophy 8 tablet 11  . FLUoxetine (PROZAC) 40 MG capsule TAKE 1 CAPSULE (40 MG TOTAL) BY MOUTH DAILY. 30 capsule 5  . fluticasone (FLONASE) 50 MCG/ACT nasal spray Place 2 sprays into both nostrils daily. 16 g 6  . ipratropium (ATROVENT) 0.03 % nasal spray Place 2 sprays into both nostrils 2 (two) times daily. 30 mL 0  . losartan-hydrochlorothiazide (HYZAAR) 100-12.5 MG tablet TAKE 1 TABLET BY MOUTH DAILY. 30 tablet 0  . meloxicam (MOBIC) 15 MG tablet TAKE 1 TABLET EVERY DAY 90 tablet 3  . Multiple Vitamin (MULTIVITAMIN) capsule Take 1 capsule by mouth daily.    . rosuvastatin (CRESTOR) 10 MG tablet Take 1 tablet by mouth daily. 90 tablet 1  . traZODone (DESYREL) 50 MG tablet TAKE 1 TABLET AT BEDTIME 90 tablet 5  . umeclidinium-vilanterol (ANORO ELLIPTA) 62.5-25 MCG/INH AEPB Inhale 1 puff into the lungs daily. 28 each 0   No current facility-administered  medications on file prior to visit.     Past Medical History:  Diagnosis Date  . Allergy   . Arthritis   . COPD (chronic obstructive pulmonary disease) ([place]) 12/09/2014  . Hyperlipidemia   . LGSIL (low grade squamous intraepithelial dysplasia)   . Osteoporosis     Past Surgical History:  Procedure Laterality Date  . JOINT REPLACEMENT    . REPLACEMENT TOTAL KNEE  2013    Social History   Social History  . Marital status: Divorced    Spouse name: N/A  . Number of children: N/A  . Years of education: N/A   Social History Main  Topics  . Smoking status: Current Every Day Smoker    Packs/day: 0.50    Years: 49.00    Types: Cigarettes    Start date: 12/08/2015  . Smokeless tobacco: Never Used  . Alcohol use No  . Drug use: No  . Sexual activity: No   Other Topics Concern  . Not on file   Social History Narrative  . No narrative on file    Family History  Problem Relation Age of Onset  . Hyperlipidemia Mother   . Alcohol abuse Father   . Arthritis Father   . Alcohol abuse Maternal Uncle   . Stroke Maternal Grandmother     Review of Systems  Constitutional: Negative for chills and fever.  Respiratory: Positive for cough, shortness of breath and wheezing.   Cardiovascular: Negative for chest pain, palpitations and leg swelling.  Neurological: Negative for light-headedness and headaches.       Objective:   Vitals:   12/27/16 1115  BP: 122/78  Pulse: 97  Resp: 16  Temp: 98 F (36.7 C)   Wt Readings from Last 3 Encounters:  12/27/16 142 lb (64.4 kg)  09/10/16 143 lb (64.9 kg)  07/11/16 141 lb (64 kg)   Body mass index is 26.83 kg/m.   Physical Exam    Constitutional: Appears well-developed and well-nourished. No distress.  HENT:  Head: Normocephalic and atraumatic.  Neck: Neck supple. No tracheal deviation present. No thyromegaly present.  No cervical lymphadenopathy Cardiovascular: Normal rate, regular rhythm and normal heart sounds.   No murmur heard. No carotid bruit .  No edema Pulmonary/Chest: Effort normal and breath sounds normal. No respiratory distress. No has no wheezes. No rales.  Skin: Skin is warm and dry. Not diaphoretic.  Psychiatric: Normal mood and affect. Behavior is normal.      Assessment & Plan:    See Problem List for Assessment and Plan of chronic medical problems.

## 2016-12-27 NOTE — Assessment & Plan Note (Signed)
Not ideally controlled-mostly because her daughter is living with her Discussed options-she does not want to switch medications We'll try increasing fluoxetine to 60 mg daily

## 2017-01-02 ENCOUNTER — Encounter: Payer: Self-pay | Admitting: Gynecology

## 2017-02-18 ENCOUNTER — Encounter: Payer: Self-pay | Admitting: Family Medicine

## 2017-02-18 ENCOUNTER — Ambulatory Visit (INDEPENDENT_AMBULATORY_CARE_PROVIDER_SITE_OTHER): Payer: Medicare Other | Admitting: Family Medicine

## 2017-02-18 VITALS — BP 119/80 | HR 106 | Temp 97.4°F | Resp 16 | Ht 61.0 in | Wt 143.4 lb

## 2017-02-18 DIAGNOSIS — N183 Chronic kidney disease, stage 3 unspecified: Secondary | ICD-10-CM

## 2017-02-18 DIAGNOSIS — Z1211 Encounter for screening for malignant neoplasm of colon: Secondary | ICD-10-CM | POA: Diagnosis not present

## 2017-02-18 DIAGNOSIS — R197 Diarrhea, unspecified: Secondary | ICD-10-CM | POA: Diagnosis not present

## 2017-02-18 DIAGNOSIS — E2839 Other primary ovarian failure: Secondary | ICD-10-CM | POA: Diagnosis not present

## 2017-02-18 DIAGNOSIS — R300 Dysuria: Secondary | ICD-10-CM | POA: Diagnosis not present

## 2017-02-18 DIAGNOSIS — Z1231 Encounter for screening mammogram for malignant neoplasm of breast: Secondary | ICD-10-CM

## 2017-02-18 DIAGNOSIS — Z1239 Encounter for other screening for malignant neoplasm of breast: Secondary | ICD-10-CM

## 2017-02-18 LAB — POC MICROSCOPIC URINALYSIS (UMFC): Mucus: ABSENT

## 2017-02-18 LAB — POCT URINALYSIS DIP (MANUAL ENTRY)
BILIRUBIN UA: NEGATIVE
Blood, UA: NEGATIVE
GLUCOSE UA: NEGATIVE mg/dL
Ketones, POC UA: NEGATIVE mg/dL
LEUKOCYTES UA: NEGATIVE
NITRITE UA: NEGATIVE
Protein Ur, POC: NEGATIVE mg/dL
Spec Grav, UA: 1.02 (ref 1.010–1.025)
Urobilinogen, UA: 0.2 E.U./dL
pH, UA: 6 (ref 5.0–8.0)

## 2017-02-18 NOTE — Progress Notes (Signed)
Chief Complaint  Patient presents with  . Dysuria  . Abdominal Pain    pain x 1 week     HPI  Urinary Frequency Pt reports that she has been having the urgency to urinate She reports that she is urinating more often She does not have dysuria She does not see any blood in her urine No flank pain No fevers or chills.  Diarrhea She has been having diarrhea for over a week As a result she went on a BRAT diet about 3 days ago She reports that her diarrhea has improved No blood in her stool Just cramping She has been trying to drink plenty of water  CKD stage 3  She is no longer taking meloxicam or ibuprofen She has been drinking plenty of water She is taking tylenol for her arthritis Lab Results  Component Value Date   CREATININE 1.41 (H) 12/27/2016      Past Medical History:  Diagnosis Date  . Allergy   . Arthritis   . COPD (chronic obstructive pulmonary disease) ([place]) 12/09/2014  . Hyperlipidemia   . LGSIL (low grade squamous intraepithelial dysplasia)   . Osteoporosis     Current Outpatient Prescriptions  Medication Sig Dispense Refill  . albuterol (PROVENTIL HFA;VENTOLIN HFA) 108 (90 Base) MCG/ACT inhaler Inhale 1-2 puffs into the lungs every 6 (six) hours as needed for wheezing or shortness of breath. 1 Inhaler 3  . atomoxetine (STRATTERA) 100 MG capsule TAKE ONE CAPSULE EVERY DAY 90 capsule 3  . FLUoxetine (PROZAC) 20 MG tablet Take 1 tablet (20 mg total) by mouth daily. In addition to 40 mg for total of 60 mg daily 90 tablet 1  . FLUoxetine (PROZAC) 40 MG capsule Take 1 capsule (40 mg total) by mouth daily. In addition to 20 mg for total of 60 mg daily 90 capsule 1  . fluticasone (FLONASE) 50 MCG/ACT nasal spray Place 2 sprays into both nostrils daily. 16 g 6  . ipratropium (ATROVENT) 0.03 % nasal spray Place 2 sprays into both nostrils 2 (two) times daily. 30 mL 0  . losartan-hydrochlorothiazide (HYZAAR) 100-12.5 MG tablet TAKE 1 TABLET BY MOUTH DAILY. 90  tablet 1  . rosuvastatin (CRESTOR) 10 MG tablet Take 1 tablet by mouth daily. 90 tablet 1  . traZODone (DESYREL) 50 MG tablet TAKE 1 TABLET AT BEDTIME 90 tablet 3  . Estradiol 10 MCG TABS vaginal tablet Use 2x/ weekly for vaginal atrophy (Patient not taking: Reported on 02/18/2017) 8 tablet 11  . Multiple Vitamin (MULTIVITAMIN) capsule Take 1 capsule by mouth daily.    Marland Kitchen umeclidinium-vilanterol (ANORO ELLIPTA) 62.5-25 MCG/INH AEPB Inhale 1 puff into the lungs daily. (Patient not taking: Reported on 02/18/2017) 28 each 0  . varenicline (CHANTIX STARTING MONTH PAK) 0.5 MG X 11 & 1 MG X 42 tablet Take one 0.5 mg tablet by mouth once daily for 3 days, then increase to one 0.5 mg tablet twice daily for 4 days, then increase to one 1 mg tablet twice daily. (Patient not taking: Reported on 02/18/2017) 53 tablet 0   No current facility-administered medications for this visit.     Allergies:  Allergies  Allergen Reactions  . Macrobid [Nitrofurantoin Monohyd Macro]     GI upset    Past Surgical History:  Procedure Laterality Date  . JOINT REPLACEMENT    . REPLACEMENT TOTAL KNEE  2013    Social History   Social History  . Marital status: Divorced    Spouse name: N/A  .  Number of children: N/A  . Years of education: N/A   Social History Main Topics  . Smoking status: Former Smoker    Packs/day: 0.50    Years: 49.00    Types: Cigarettes    Start date: 12/08/2015    Quit date: 01/19/2017  . Smokeless tobacco: Never Used  . Alcohol use No  . Drug use: No  . Sexual activity: No   Other Topics Concern  . None   Social History Narrative  . None    ROS See hpi  Objective: Vitals:   02/18/17 1103  BP: 119/80  Pulse: (!) 106  Resp: 16  Temp: 97.4 F (36.3 C)  TempSrc: Oral  SpO2: 97%  Weight: 143 lb 6.4 oz (65 kg)  Height: 5\' 1"  (1.549 m)    Physical Exam  Constitutional: She is oriented to person, place, and time. She appears well-developed and well-nourished.  HENT:  Head:  Normocephalic and atraumatic.  Cardiovascular: Normal rate, regular rhythm and normal heart sounds.   Pulmonary/Chest: Effort normal and breath sounds normal. No respiratory distress. She has no wheezes.  Musculoskeletal: Normal range of motion.  Neurological: She is alert and oriented to person, place, and time.   ua neg LE and nit   Assessment and Plan Sheryl [name] was seen today for dysuria and abdominal pain.  Diagnoses and all orders for this visit:  Dysuria- no UTI on UA Will send urine culture -     POCT urinalysis dipstick -     POCT Microscopic Urinalysis ([facility]) -     Urine Culture  Stage 3 chronic kidney disease- CKD stable Advised continued abstinence from nsaids -     Comprehensive metabolic panel  Diarrhea, unspecified type- will check potassium Improving  Screening for breast cancer -     [facility] MAMMOGRAPHY  Screen for colon cancer -     [facility] COLONOSCOPY  Estrogen deficiency- will screen for osteoporosis -     DG Bone Density; Future     Sheryl [name] A Nolon Rod

## 2017-02-18 NOTE — Progress Notes (Signed)
sh

## 2017-02-18 NOTE — Patient Instructions (Addendum)
     IF you received an x-ray today, you will receive an invoice from [facility]. Please contact [facility] at [phone] with questions or concerns regarding your invoice.   IF you received labwork today, you will receive an invoice from [facility]. Please contact [facility] at [phone] with questions or concerns regarding your invoice.   Our billing staff will not be able to assist you with questions regarding bills from these companies.  You will be contacted with the lab results as soon as they are available. The fastest way to get your results is to activate your My Chart account. Instructions are located on the last page of this paperwork. If you have not heard from us regarding the results in 2 weeks, please contact this office.     Chronic Kidney Disease, Adult Chronic kidney disease (CKD) happens when the kidneys are damaged during a time of 3 or more months. The kidneys are two organs that do many important jobs in the body. These jobs include:  Removing wastes and extra fluids from the blood.  Making hormones that maintain the amount of fluid in your tissues and blood vessels.  Making sure that the body has the right amount of fluids and chemicals.  Most of the time, this condition does not go away, but it can usually be controlled. Steps must be taken to slow down the kidney damage or stop it from getting worse. Otherwise, the kidneys may stop working. Follow these instructions at home:  Follow your diet as told by your doctor. You may need to avoid alcohol, salty foods (sodium), and foods that are high in potassium, calcium, and protein.  Take over-the-counter and prescription medicines only as told by your doctor. Do not take any new medicines unless your doctor says you can do that. These include vitamins and minerals. ? Medicines and nutritional supplements can make kidney damage worse. ? Your doctor may need to change how much medicine you  take.  Do not use any tobacco products. These include cigarettes, chewing tobacco, and e-cigarettes. If you need help quitting, ask your doctor.  Keep all follow-up visits as told by your doctor. This is important.  Check your blood pressure. Tell your doctor if there are changes to your blood pressure.  Get to a healthy weight. Stay at that weight. If you need help with this, ask your doctor.  Start or continue an exercise plan. Try to exercise at least 30 minutes a day, 5 days a week.  Stay up-to-date with your shots (immunizations) as told by your doctor. Contact a doctor if:  Your symptoms get worse.  You have new symptoms. Get help right away if:  You have symptoms of end-stage kidney disease. These include: ? Headaches. ? Skin that is darker or lighter than normal. ? Numbness in your hands or feet. ? Easy bruising. ? Having hiccups often. ? Chest pain. ? Shortness of breath. ? Stopping of menstrual periods in women.  You have a fever.  You are making very little pee (urine).  You have pain or bleeding when you pee (urinate). This information is not intended to replace advice given to you by your health care provider. Make sure you discuss any questions you have with your health care provider. Document Released: 10/31/2009 Document Revised: 01/12/2016 Document Reviewed: 04/04/2012 Elsevier Interactive Patient Education  2017 [employer] Inc.  

## 2017-02-19 LAB — COMPREHENSIVE METABOLIC PANEL
ALK PHOS: 132 IU/L — AB (ref 39–117)
ALT: 24 IU/L (ref 0–32)
AST: 22 IU/L (ref 0–40)
Albumin/Globulin Ratio: 1.3 (ref 1.2–2.2)
Albumin: 4.1 g/dL (ref 3.6–4.8)
BILIRUBIN TOTAL: 0.2 mg/dL (ref 0.0–1.2)
BUN/Creatinine Ratio: 11 — ABNORMAL LOW (ref 12–28)
BUN: 14 mg/dL (ref 8–27)
CHLORIDE: 101 mmol/L (ref 96–106)
CO2: 23 mmol/L (ref 20–29)
Calcium: 9.2 mg/dL (ref 8.7–10.3)
Creatinine, Ser: 1.22 mg/dL — ABNORMAL HIGH (ref 0.57–1.00)
GFR calc Af Amer: 53 mL/min/{1.73_m2} — ABNORMAL LOW (ref >59)
GFR calc non Af Amer: 46 mL/min/{1.73_m2} — ABNORMAL LOW (ref >59)
GLUCOSE: 81 mg/dL (ref 65–99)
Globulin, Total: 3.2 g/dL (ref 1.5–4.5)
Potassium: 4.5 mmol/L (ref 3.5–5.2)
Sodium: 138 mmol/L (ref 134–144)
Total Protein: 7.3 g/dL (ref 6.0–8.5)

## 2017-02-19 LAB — URINE CULTURE

## 2017-03-05 ENCOUNTER — Other Ambulatory Visit: Payer: Self-pay | Admitting: Family Medicine

## 2017-03-05 DIAGNOSIS — Z1231 Encounter for screening mammogram for malignant neoplasm of breast: Secondary | ICD-10-CM

## 2017-04-23 ENCOUNTER — Other Ambulatory Visit: Payer: Self-pay | Admitting: Physician Assistant

## 2017-04-23 DIAGNOSIS — Z760 Encounter for issue of repeat prescription: Secondary | ICD-10-CM

## 2017-05-14 ENCOUNTER — Other Ambulatory Visit: Payer: Self-pay | Admitting: Physician Assistant

## 2017-05-14 DIAGNOSIS — Z760 Encounter for issue of repeat prescription: Secondary | ICD-10-CM

## 2017-06-11 ENCOUNTER — Other Ambulatory Visit: Payer: Self-pay | Admitting: Physician Assistant

## 2017-06-11 DIAGNOSIS — Z760 Encounter for issue of repeat prescription: Secondary | ICD-10-CM

## 2017-06-16 ENCOUNTER — Other Ambulatory Visit: Payer: Self-pay | Admitting: Physician Assistant

## 2017-06-16 DIAGNOSIS — Z760 Encounter for issue of repeat prescription: Secondary | ICD-10-CM

## 2017-06-17 ENCOUNTER — Telehealth: Payer: Self-pay | Admitting: Internal Medicine

## 2017-06-17 NOTE — Telephone Encounter (Signed)
Prescribed by Philis Fendt

## 2017-06-17 NOTE — Telephone Encounter (Signed)
Patient requesting refill on strattera.  Patient uses [facility] on [address].

## 2017-06-18 NOTE — Telephone Encounter (Signed)
Notified pt w/MD response.../lmb 
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
## 2017-06-19 ENCOUNTER — Ambulatory Visit (INDEPENDENT_AMBULATORY_CARE_PROVIDER_SITE_OTHER): Payer: Medicare Other | Admitting: Internal Medicine

## 2017-06-19 ENCOUNTER — Encounter: Payer: Self-pay | Admitting: Internal Medicine

## 2017-06-19 VITALS — BP 146/80 | HR 90 | Temp 97.6°F | Resp 16 | Wt 146.0 lb

## 2017-06-19 DIAGNOSIS — I10 Essential (primary) hypertension: Secondary | ICD-10-CM | POA: Diagnosis not present

## 2017-06-19 DIAGNOSIS — F17210 Nicotine dependence, cigarettes, uncomplicated: Secondary | ICD-10-CM | POA: Diagnosis not present

## 2017-06-19 DIAGNOSIS — F988 Other specified behavioral and emotional disorders with onset usually occurring in childhood and adolescence: Secondary | ICD-10-CM | POA: Diagnosis not present

## 2017-06-19 DIAGNOSIS — F329 Major depressive disorder, single episode, unspecified: Secondary | ICD-10-CM | POA: Diagnosis not present

## 2017-06-19 DIAGNOSIS — Z760 Encounter for issue of repeat prescription: Secondary | ICD-10-CM

## 2017-06-19 DIAGNOSIS — Z23 Encounter for immunization: Secondary | ICD-10-CM | POA: Diagnosis not present

## 2017-06-19 DIAGNOSIS — F32A Depression, unspecified: Secondary | ICD-10-CM

## 2017-06-19 MED ORDER — ATOMOXETINE HCL 100 MG PO CAPS: 100.0000 mg | ORAL_CAPSULE | Freq: Every day | ORAL | 3 refills | Status: DC

## 2017-06-19 MED ORDER — VARENICLINE TARTRATE 1 MG PO TABS: 1.0000 mg | ORAL_TABLET | Freq: Two times a day (BID) | ORAL | 2 refills | Status: DC

## 2017-06-19 NOTE — Assessment & Plan Note (Signed)
Continue chantix - increase to BID Script sent to pharmacy

## 2017-06-19 NOTE — Assessment & Plan Note (Signed)
Improved Controlled, stable Continue current dose of medication

## 2017-06-19 NOTE — Assessment & Plan Note (Signed)
Taking strattera daily - has been on it for years Previously prescribed by prior PCP Will prescribe

## 2017-06-19 NOTE — Assessment & Plan Note (Signed)
BP Readings from Last 3 Encounters:  06/19/17 (!) 146/80  02/18/17 119/80  12/27/16 122/78    BP well controlled Current regimen effective and well tolerated Continue current medications at current doses

## 2017-06-19 NOTE — Patient Instructions (Addendum)
   Flu immunization administered today.     Medications reviewed and updated.  No changes recommended at this time.  Your prescription(s) have been submitted to your pharmacy. Please take as directed and contact our office if you believe you are having problem(s) with the medication(s).   Please followup in 6 months   

## 2017-06-21 ENCOUNTER — Ambulatory Visit: Payer: Medicare Other

## 2017-06-21 NOTE — Progress Notes (Deleted)
Pre visit review using our clinic review tool, if applicable. No additional management support is needed unless otherwise documented below in the visit note. 

## 2017-06-21 NOTE — Progress Notes (Deleted)
Subjective:   Sheryl [name] is a 66 y.o. female who presents for an Initial Medicare Annual Wellness Visit.  Review of Systems    No ROS.  Medicare Wellness Visit. Additional risk factors are reflected in the social history.     Sleep patterns: {SX; SLEEP PATTERNS:18802::"feels rested on waking","does not get up to void","gets up *** times nightly to void","sleeps *** hours nightly"}.    Home Safety/Smoke Alarms: Feels safe in home. Smoke alarms in place.  Living environment; residence and Firearm Safety: {Rehab home environment / accessibility:30080::"no firearms","firearms stored safely"}. Seat Belt Safety/Bike Helmet: Wears seat belt.     Objective:    There were no vitals filed for this visit. There is no height or weight on file to calculate BMI.   Current Medications (verified) Outpatient Encounter Prescriptions as of 06/21/2017  Medication Sig  . albuterol (PROVENTIL HFA;VENTOLIN HFA) 108 (90 Base) MCG/ACT inhaler Inhale 1-2 puffs into the lungs every 6 (six) hours as needed for wheezing or shortness of breath.  Marland Kitchen atomoxetine (STRATTERA) 100 MG capsule Take 1 capsule (100 mg total) by mouth daily.  . Estradiol 10 MCG TABS vaginal tablet Use 2x/ weekly for vaginal atrophy  . FLUoxetine (PROZAC) 20 MG tablet Take 1 tablet (20 mg total) by mouth daily. In addition to 40 mg for total of 60 mg daily  . FLUoxetine (PROZAC) 40 MG capsule Take 1 capsule (40 mg total) by mouth daily. In addition to 20 mg for total of 60 mg daily  . fluticasone (FLONASE) 50 MCG/ACT nasal spray Place 2 sprays into both nostrils daily.  Marland Kitchen ipratropium (ATROVENT) 0.03 % nasal spray Place 2 sprays into both nostrils 2 (two) times daily.  Marland Kitchen losartan-hydrochlorothiazide (HYZAAR) 100-12.5 MG tablet TAKE 1 TABLET BY MOUTH DAILY.  . Multiple Vitamin (MULTIVITAMIN) capsule Take 1 capsule by mouth daily.  . rosuvastatin (CRESTOR) 10 MG tablet Take 1 tablet by mouth daily.  . traZODone (DESYREL) 50 MG tablet  TAKE 1 TABLET AT BEDTIME  . umeclidinium-vilanterol (ANORO ELLIPTA) 62.5-25 MCG/INH AEPB Inhale 1 puff into the lungs daily.  . varenicline (CHANTIX CONTINUING MONTH PAK) 1 MG tablet Take 1 tablet (1 mg total) by mouth 2 (two) times daily.   No facility-administered encounter medications on file as of 06/21/2017.     Allergies (verified) Macrobid [nitrofurantoin monohyd macro]   History: Past Medical History:  Diagnosis Date  . Allergy   . Arthritis   . COPD (chronic obstructive pulmonary disease) ([place]) 12/09/2014  . Hyperlipidemia   . LGSIL (low grade squamous intraepithelial dysplasia)   . Osteoporosis    Past Surgical History:  Procedure Laterality Date  . JOINT REPLACEMENT    . REPLACEMENT TOTAL KNEE  2013   Family History  Problem Relation Age of Onset  . Hyperlipidemia Mother   . Alcohol abuse Father   . Arthritis Father   . Alcohol abuse Maternal Uncle   . Stroke Maternal Grandmother    Social History   Occupational History  . Not on file.   Social History Main Topics  . Smoking status: Former Smoker    Packs/day: 0.50    Years: 49.00    Types: Cigarettes    Start date: 12/08/2015    Quit date: 01/19/2017  . Smokeless tobacco: Never Used  . Alcohol use No  . Drug use: No  . Sexual activity: No    Tobacco Counseling Counseling given: Not Answered   Activities of Daily Living No flowsheet data found.  Immunizations and Health  Maintenance Immunization History  Administered Date(s) Administered  . Influenza Split 06/04/2012  . Influenza, High Dose Seasonal PF 06/29/2016, 06/19/2017  . Influenza, Seasonal, Injecte, Preservative Fre 07/19/2013  . Influenza,inj,Quad PF,6+ Mos 06/01/2015  . Pneumococcal Conjugate-13 07/19/2013  . Pneumococcal Polysaccharide-23 11/09/2015  . Td 09/22/2014   Health Maintenance Due  Topic Date Due  . DEXA SCAN  08/06/2015    Patient Care Team: Binnie Rail, MD as PCP - General (Internal Medicine)  Indicate any  recent Medical Services you may have received from other than [facility] providers in the past year (date may be approximate).     Assessment:   This is a routine wellness examination for Sheryl [name]. Physical assessment deferred to PCP.   Hearing/Vision screen No exam data present  Dietary issues and exercise activities discussed:   Diet (meal preparation, eat out, water intake, caffeinated beverages, dairy products, fruits and vegetables): {Desc; diets:16563}  Goals    None     Depression Screen PHQ 2/9 Scores 06/19/2017 02/18/2017 03/22/2016 02/14/2016 01/23/2016 11/09/2015 10/05/2015  PHQ - 2 Score 0 0 5 0 0 0 0  PHQ- 9 Score - - 15 - - - -    Fall Risk Fall Risk  06/19/2017 02/18/2017 03/22/2016 02/14/2016 01/23/2016  Falls in the past year? No No Yes No No  Number falls in past yr: - - 1 - -  Injury with Fall? - - Yes - -    Cognitive Function:        Screening Tests Health Maintenance  Topic Date Due  . DEXA SCAN  08/06/2015  . MAMMOGRAM  02/19/2019  . TETANUS/TDAP  09/22/2024  . COLONOSCOPY  02/19/2027  . INFLUENZA VACCINE  Completed  . Hepatitis C Screening  Completed  . PNA vac Low Risk Adult  Completed      Plan:     I have personally reviewed and noted the following in the patient's chart:   . Medical and social history . Use of alcohol, tobacco or illicit drugs  . Current medications and supplements . Functional ability and status . Nutritional status . Physical activity . Advanced directives . List of other physicians . Vitals . Screenings to include cognitive, depression, and falls . Referrals and appointments  In addition, I have reviewed and discussed with patient certain preventive protocols, quality metrics, and best practice recommendations. A written personalized care plan for preventive services as well as general preventive health recommendations were provided to patient.     Michiel Cowboy, RN   06/21/2017

## 2017-06-22 ENCOUNTER — Other Ambulatory Visit: Payer: Self-pay | Admitting: Internal Medicine

## 2017-06-22 DIAGNOSIS — Z760 Encounter for issue of repeat prescription: Secondary | ICD-10-CM

## 2017-07-01 NOTE — Progress Notes (Addendum)
Subjective:   Sheryl [name] is a 66 y.o. female who presents for an Initial Medicare Annual Wellness Visit.  Review of Systems    No ROS.  Medicare Wellness Visit. Additional risk factors are reflected in the social history. Patient states that she feels like she is getting an UTI. She is experiencing burning, frequency and urgency symptoms concerning urination.   Cardiac Risk Factors include: dyslipidemia;advanced age (>19men, >52 women);hypertension Sleep patterns: feels rested on waking, gets up 1 times nightly to void and sleeps 7-8 hours nightly.    Home Safety/Smoke Alarms: Feels safe in home. Smoke alarms in place.  Living environment; residence and Firearm Safety: 2-story house, no firearms. Lives with daughter, no needs for DME, good support system Seat Belt Safety/Bike Helmet: Wears seat belt.     Objective:    Today's Vitals   07/02/17 0917 07/02/17 0921  BP: 134/82   Pulse: 96   Resp: 20   SpO2: 99%   Weight: 144 lb (65.3 kg)   Height: 5\' 1"  (1.549 m)   PainSc:  2    Body mass index is 27.21 kg/m.   Current Medications (verified) Outpatient Encounter Medications as of 07/02/2017  Medication Sig  . albuterol (PROVENTIL HFA;VENTOLIN HFA) 108 (90 Base) MCG/ACT inhaler Inhale 1-2 puffs into the lungs every 6 (six) hours as needed for wheezing or shortness of breath.  Marland Kitchen atomoxetine (STRATTERA) 100 MG capsule Take 1 capsule (100 mg total) by mouth daily.  . Estradiol 10 MCG TABS vaginal tablet Use 2x/ weekly for vaginal atrophy  . FLUoxetine (PROZAC) 20 MG capsule TAKE 1 CAPSULE (20 MG TOTAL) BY MOUTH DAILY. IN ADDITION TO 40 MG FOR TOTAL OF 60 MG DAILY  . FLUoxetine (PROZAC) 40 MG capsule Take 1 capsule (40 mg total) by mouth daily. In addition to 20 mg for total of 60 mg daily  . fluticasone (FLONASE) 50 MCG/ACT nasal spray Place 2 sprays into both nostrils daily.  Marland Kitchen ipratropium (ATROVENT) 0.03 % nasal spray Place 2 sprays into both nostrils 2 (two) times daily.    Marland Kitchen losartan-hydrochlorothiazide (HYZAAR) 100-12.5 MG tablet TAKE 1 TABLET BY MOUTH EVERY DAY  . Multiple Vitamin (MULTIVITAMIN) capsule Take 1 capsule by mouth daily.  . rosuvastatin (CRESTOR) 10 MG tablet Take 1 tablet by mouth daily.  . traZODone (DESYREL) 50 MG tablet TAKE 1 TABLET AT BEDTIME  . umeclidinium-vilanterol (ANORO ELLIPTA) 62.5-25 MCG/INH AEPB Inhale 1 puff into the lungs daily.  . varenicline (CHANTIX CONTINUING MONTH PAK) 1 MG tablet Take 1 tablet (1 mg total) by mouth 2 (two) times daily.   No facility-administered encounter medications on file as of 07/02/2017.     Allergies (verified) Macrobid [nitrofurantoin monohyd macro]   History: Past Medical History:  Diagnosis Date  . Allergy   . Arthritis   . COPD (chronic obstructive pulmonary disease) ([place]) 12/09/2014  . Hyperlipidemia   . LGSIL (low grade squamous intraepithelial dysplasia)   . Osteoporosis    Past Surgical History:  Procedure Laterality Date  . JOINT REPLACEMENT    . REPLACEMENT TOTAL KNEE  2013   Family History  Problem Relation Age of Onset  . Hyperlipidemia Mother   . Alcohol abuse Father   . Arthritis Father   . Alcohol abuse Maternal Uncle   . Stroke Maternal Grandmother    Social History   Occupational History  . Not on file  Tobacco Use  . Smoking status: Former Smoker    Packs/day: 0.50    Years: 49.00  Pack years: 24.50    Types: Cigarettes    Start date: 12/08/2015    Last attempt to quit: 01/19/2017    Years since quitting: 0.4  . Smokeless tobacco: Never Used  Substance and Sexual Activity  . Alcohol use: No    Alcohol/week: 0.0 oz  . Drug use: No  . Sexual activity: No    Tobacco Counseling Counseling given: Not Answered   Activities of Daily Living In your present state of health, do you have any difficulty performing the following activities: 07/02/2017  Hearing? Y  Vision? N  Difficulty concentrating or making decisions? N  Walking or climbing stairs? N   Dressing or bathing? N  Doing errands, shopping? N  Preparing Food and eating ? N  Using the Toilet? N  In the past six months, have you accidently leaked urine? N  Do you have problems with loss of bowel control? N  Managing your Medications? N  Managing your Finances? N  Housekeeping or managing your Housekeeping? N  Some recent data might be hidden    Immunizations and Health Maintenance Immunization History  Administered Date(s) Administered  . Influenza Split 06/04/2012  . Influenza, High Dose Seasonal PF 06/29/2016, 06/19/2017  . Influenza, Seasonal, Injecte, Preservative Fre 07/19/2013  . Influenza,inj,Quad PF,6+ Mos 06/01/2015  . Pneumococcal Conjugate-13 07/19/2013  . Pneumococcal Polysaccharide-23 11/09/2015  . Td 09/22/2014   Health Maintenance Due  Topic Date Due  . DEXA SCAN  08/06/2015    Patient Care Team: Binnie Rail, MD as PCP - General (Internal Medicine)  Indicate any recent Medical Services you may have received from other than [facility] providers in the past year (date may be approximate).     Assessment:   This is a routine wellness examination for Sheryl [name]. Physical assessment deferred to PCP.   Hearing/Vision screen Hearing Screening Comments: HOH, has hearing aids Vision Screening Comments: appointment yearly [facility] ophthalmology  Dietary issues and exercise activities discussed: Current Exercise Habits: The patient does not participate in regular exercise at present(States she has [employer], will check into [employer]), Exercise limited by: orthopedic condition(s)  Diet (meal preparation, eat out, water intake, caffeinated beverages, dairy products, fruits and vegetables): in general, a "healthy" diet     Reviewed heart healthy diet, encouraged patient to increase daily water intake. Diet education was attached to patient's AVS.  Goals    None     Depression Screen PHQ 2/9 Scores 07/02/2017 06/19/2017 02/18/2017 03/22/2016  02/14/2016 01/23/2016 11/09/2015  PHQ - 2 Score 2 0 0 5 0 0 0  PHQ- 9 Score 3 - - 15 - - -    Fall Risk Fall Risk  07/02/2017 06/19/2017 02/18/2017 03/22/2016 02/14/2016  Falls in the past year? No No No Yes No  Number falls in past yr: - - - 1 -  Injury with Fall? - - - Yes -    Cognitive Function: MMSE - Mini Mental State Exam 07/02/2017  Orientation to time 5  Orientation to Place 5  Registration 3  Attention/ Calculation 4  Recall 2  Language- name 2 objects 2  Language- repeat 1  Language- follow 3 step command 3  Language- read & follow direction 1  Write a sentence 1  Copy design 1  Total score 28        Screening Tests Health Maintenance  Topic Date Due  . DEXA SCAN  08/06/2015  . MAMMOGRAM  02/19/2019  . TETANUS/TDAP  09/22/2024  . COLONOSCOPY  02/19/2027  . INFLUENZA VACCINE  Completed  . Hepatitis C Screening  Completed  . PNA vac Low Risk Adult  Completed      Plan:   Urinalysis and urine culture order received to evaluate dysuria symptoms.  Screening orders placed for mammogram, colonoscopy, and bone density.  Continue doing brain stimulating activities (puzzles, reading, adult coloring books, staying active) to keep memory sharp.   Continue to eat heart healthy diet (full of fruits, vegetables, whole grains, lean protein, water--limit salt, fat, and sugar intake) and increase physical activity as tolerated.   I have personally reviewed and noted the following in the patient's chart:   . Medical and social history . Use of alcohol, tobacco or illicit drugs  . Current medications and supplements . Functional ability and status . Nutritional status . Physical activity . Advanced directives . List of other physicians . Vitals . Screenings to include cognitive, depression, and falls . Referrals and appointments  In addition, I have reviewed and discussed with patient certain preventive protocols, quality metrics, and best practice recommendations. A  written personalized care plan for preventive services as well as general preventive health recommendations were provided to patient.     Michiel Cowboy, RN   07/02/2017     Medical screening examination/treatment/procedure(s) were performed by non-physician practitioner and as supervising physician I was immediately available for consultation/collaboration. I agree with above. Binnie Rail, MD

## 2017-07-02 ENCOUNTER — Other Ambulatory Visit (INDEPENDENT_AMBULATORY_CARE_PROVIDER_SITE_OTHER): Payer: Medicare Other

## 2017-07-02 ENCOUNTER — Ambulatory Visit (INDEPENDENT_AMBULATORY_CARE_PROVIDER_SITE_OTHER): Payer: Medicare Other | Admitting: *Deleted

## 2017-07-02 ENCOUNTER — Ambulatory Visit: Payer: Medicare Other | Admitting: Internal Medicine

## 2017-07-02 VITALS — BP 134/82 | HR 96 | Resp 20 | Ht 61.0 in | Wt 144.0 lb

## 2017-07-02 DIAGNOSIS — R300 Dysuria: Secondary | ICD-10-CM

## 2017-07-02 DIAGNOSIS — E2839 Other primary ovarian failure: Secondary | ICD-10-CM

## 2017-07-02 DIAGNOSIS — Z0000 Encounter for general adult medical examination without abnormal findings: Secondary | ICD-10-CM | POA: Diagnosis not present

## 2017-07-02 DIAGNOSIS — Z1211 Encounter for screening for malignant neoplasm of colon: Secondary | ICD-10-CM | POA: Diagnosis not present

## 2017-07-02 DIAGNOSIS — Z1231 Encounter for screening mammogram for malignant neoplasm of breast: Secondary | ICD-10-CM | POA: Diagnosis not present

## 2017-07-02 LAB — URINALYSIS
BILIRUBIN URINE: NEGATIVE
Hgb urine dipstick: NEGATIVE
KETONES UR: NEGATIVE
LEUKOCYTES UA: NEGATIVE
Nitrite: NEGATIVE
PH: 7 (ref 5.0–8.0)
Specific Gravity, Urine: 1.02 (ref 1.000–1.030)
TOTAL PROTEIN, URINE-UPE24: NEGATIVE
URINE GLUCOSE: NEGATIVE
Urobilinogen, UA: 0.2 (ref 0.0–1.0)

## 2017-07-02 NOTE — Patient Instructions (Signed)
Continue doing brain stimulating activities (puzzles, reading, adult coloring books, staying active) to keep memory sharp.   Continue to eat heart healthy diet (full of fruits, vegetables, whole grains, lean protein, water--limit salt, fat, and sugar intake) and increase physical activity as tolerated.  Sheryl [name] , Thank you for taking time to come for your Medicare Wellness Visit. I appreciate your ongoing commitment to your health goals. Please review the following plan we discussed and let me know if I can assist you in the future.   These are the goals we discussed: Start to go to [employer] and quit smoking. Drink more water, I will keep water in the refrigerator. Monitor diet for fat.  Goals    None      This is a list of the screening recommended for you and due dates:  Health Maintenance  Topic Date Due  . DEXA scan (bone density measurement)  08/06/2015  . Mammogram  02/19/2019  . Tetanus Vaccine  09/22/2024  . Colon Cancer Screening  02/19/2027  . Flu Shot  Completed  .  Hepatitis C: One time screening is recommended by Center for Disease Control  (CDC) for  adults born from [birth year] through 1966.   Completed  . Pneumonia vaccines  Completed    Heart-Healthy Eating Plan Heart-healthy meal planning includes:  Limiting unhealthy fats.  Increasing healthy fats.  Making other small dietary changes.  You may need to talk with your doctor or a diet specialist (dietitian) to create an eating plan that is right for you. What types of fat should I choose?  Choose healthy fats. These include olive oil and canola oil, flaxseeds, walnuts, almonds, and seeds.  Eat more omega-3 fats. These include salmon, mackerel, sardines, tuna, flaxseed oil, and ground flaxseeds. Try to eat fish at least twice each week.  Limit saturated fats. ? Saturated fats are often found in animal products, such as meats, butter, and cream. ? Plant sources of saturated fats include palm oil, palm  kernel oil, and coconut oil.  Avoid foods with partially hydrogenated oils in them. These include stick margarine, some tub margarines, cookies, crackers, and other baked goods. These contain trans fats. What general guidelines do I need to follow?  Check food labels carefully. Identify foods with trans fats or high amounts of saturated fat.  Fill one half of your plate with vegetables and green salads. Eat 4-5 servings of vegetables per day. A serving of vegetables is: ? 1 cup of raw leafy vegetables. ?  cup of raw or cooked cut-up vegetables. ?  cup of vegetable juice.  Fill one fourth of your plate with whole grains. Look for the word "whole" as the first word in the ingredient list.  Fill one fourth of your plate with lean protein foods.  Eat 4-5 servings of fruit per day. A serving of fruit is: ? One medium whole fruit. ?  cup of dried fruit. ?  cup of fresh, frozen, or canned fruit. ?  cup of 100% fruit juice.  Eat more foods that contain soluble fiber. These include apples, broccoli, carrots, beans, peas, and barley. Try to get 20-30 g of fiber per day.  Eat more home-cooked food. Eat less restaurant, buffet, and fast food.  Limit or avoid alcohol.  Limit foods high in starch and sugar.  Avoid fried foods.  Avoid frying your food. Try baking, boiling, grilling, or broiling it instead. You can also reduce fat by: ? Removing the skin from poultry. ? Removing  all visible fats from meats. ? Skimming the fat off of stews, soups, and gravies before serving them. ? Steaming vegetables in water or broth.  Lose weight if you are overweight.  Eat 4-5 servings of nuts, legumes, and seeds per week: ? One serving of dried beans or legumes equals  cup after being cooked. ? One serving of nuts equals 1 ounces. ? One serving of seeds equals  ounce or one tablespoon.  You may need to keep track of how much salt or sodium you eat. This is especially true if you have high  blood pressure. Talk with your doctor or dietitian to get more information. What foods can I eat? Grains Breads, including [place], white, pita, wheat, raisin, rye, oatmeal, and [place]. Tortillas that are neither fried nor made with lard or trans fat. Low-fat rolls, including hotdog and hamburger buns and English muffins. Biscuits. Muffins. Waffles. Pancakes. Light popcorn. Whole-grain cereals. Flatbread. Melba toast. Pretzels. Breadsticks. Rusks. Low-fat snacks. Low-fat crackers, including oyster, saltine, matzo, graham, animal, and rye. Rice and pasta, including brown rice and pastas that are made with whole wheat. Vegetables All vegetables. Fruits All fruits, but limit coconut. Meats and Other Protein Sources Lean, well-trimmed beef, veal, pork, and lamb. Chicken and [place] without skin. All fish and shellfish. Wild duck, rabbit, pheasant, and venison. Egg whites or low-cholesterol egg substitutes. Dried beans, peas, lentils, and tofu. Seeds and most nuts. Dairy Low-fat or nonfat cheeses, including ricotta, string, and mozzarella. Skim or 1% milk that is liquid, powdered, or evaporated. Buttermilk that is made with low-fat milk. Nonfat or low-fat yogurt. Beverages Mineral water. Diet carbonated beverages. Sweets and Desserts Sherbets and fruit ices. Honey, jam, marmalade, jelly, and syrups. Meringues and gelatins. Pure sugar candy, such as hard candy, jelly beans, gumdrops, mints, marshmallows, and small amounts of dark chocolate. [employer] Eat all sweets and desserts in moderation. Fats and Oils Nonhydrogenated (trans-free) margarines. Vegetable oils, including soybean, sesame, sunflower, olive, peanut, safflower, corn, canola, and cottonseed. Salad dressings or mayonnaise made with a vegetable oil. Limit added fats and oils that you use for cooking, baking, salads, and as spreads. Other Cocoa powder. Coffee and tea. All seasonings and condiments. The items listed above may not be a  complete list of recommended foods or beverages. Contact your dietitian for more options. What foods are not recommended? Grains Breads that are made with saturated or trans fats, oils, or whole milk. Croissants. Butter rolls. Cheese breads. Sweet rolls. Donuts. Buttered popcorn. Chow mein noodles. High-fat crackers, such as cheese or butter crackers. Meats and Other Protein Sources Fatty meats, such as hotdogs, short ribs, sausage, spareribs, bacon, rib eye roast or steak, and mutton. High-fat deli meats, such as salami and bologna. Caviar. Domestic duck and goose. Organ meats, such as kidney, liver, sweetbreads, and heart. Dairy Cream, sour cream, cream cheese, and creamed cottage cheese. Whole-milk cheeses, including blue (bleu), [place], [place], [place], American, [place], Swiss, cheddar, [place], and [place]. Whole or 2% milk that is liquid, evaporated, or condensed. Whole buttermilk. Cream sauce or high-fat cheese sauce. Yogurt that is made from whole milk. Beverages Regular sodas and juice drinks with added sugar. Sweets and Desserts Frosting. Pudding. Cookies. Cakes other than angel food cake. Candy that has milk chocolate or white chocolate, hydrogenated fat, butter, coconut, or unknown ingredients. Buttered syrups. Full-fat ice cream or ice cream drinks. Fats and Oils Gravy that has suet, meat fat, or shortening. Cocoa butter, hydrogenated oils, palm oil, coconut oil, palm kernel  oil. These can often be found in baked products, candy, fried foods, nondairy creamers, and whipped toppings. Solid fats and shortenings, including bacon fat, salt pork, lard, and butter. Nondairy cream substitutes, such as coffee creamers and sour cream substitutes. Salad dressings that are made of unknown oils, cheese, or sour cream. The items listed above may not be a complete list of foods and beverages to avoid. Contact your dietitian for more information. This information is not intended to replace advice  given to you by your health care provider. Make sure you discuss any questions you have with your health care provider. Document Released: 02/05/2012 Document Revised: 01/12/2016 Document Reviewed: 01/28/2014 Elsevier Interactive Patient Education  2018 [place] Content in Foods Generally, most healthy people need around 50 grams of protein each day. Depending on your overall health, you may need more or less protein in your diet. Talk to your health care provider or dietitian about how much protein you need. See the following list for the protein content of some common foods. High-protein foods High-protein foods contain 4 grams (4 g) or more of protein per serving. They include:  Beef, ground sirloin (cooked) - 3 oz have 24 g of protein.  Cheese (hard) - 1 oz has 7 g of protein.  Chicken breast, boneless and skinless (cooked) - 3 oz have 13.4 g of protein.  Cottage cheese - 1/2 cup has 13.4 g of protein.  Egg - 1 egg has 6 g of protein.  Fish, filet (cooked) - 1 oz has 6-7 g of protein.  Garbanzo beans (canned or cooked) - 1/2 cup has 6-7 g of protein.  Kidney beans (canned or cooked) - 1/2 cup has 6-7 g of protein.  Lamb (cooked) - 3 oz has 24 g of protein.  Milk - 1 cup (8 oz) has 8 g of protein.  Nuts (peanuts, pistachios, almonds) - 1 oz has 6 g of protein.  Peanut butter - 1 oz has 7-8 g of protein.  Pork tenderloin (cooked) - 3 oz has 18.4 g of protein.  Pumpkin seeds - 1 oz has 8.5 g of protein.  Soybeans (roasted) - 1 oz has 8 g of protein.  Soybeans (cooked) - 1/2 cup has 11 g of protein.  Soy milk - 1 cup (8 oz) has 5-10 g of protein.  Soy or vegetable patty - 1 patty has 11 g of protein.  Sunflower seeds - 1 oz has 5.5 g of protein.  Tofu (firm) - 1/2 cup has 20 g of protein.  Tuna (canned in water) - 3 oz has 20 g of protein.  Yogurt - 6 oz has 8 g of protein.  Low-protein foods Low-protein foods contain 3 grams (3 g) or less of  protein per serving. They include:  Beets (raw or cooked) - 1/2 cup has 1.5 g of protein.  Bran cereal - 1/2 cup has 2-3 g of protein.  Bread - 1 slice has 2.5 g of protein.  Broccoli (raw or cooked) - 1/2 cup has 2 g of protein.  Collard greens (raw or cooked) - 1/2 cup has 2 g of protein.  Corn (fresh or cooked) - 1/2 cup has 2 g of protein.  Cream cheese - 1 oz has 2 g of protein.  Creamer (half-and-half) - 1 oz has 1 g of protein.  Flour tortilla - 1 tortilla has 2.5 g of protein  Frozen yogurt - 1/2 cup has 3 g of protein.  Fruit or vegetable  juice - 1/2 cup has 1 g of protein.  Green beans (raw or cooked) - 1/2 cup has 1 g of protein.  Green peas (canned) - 1/2 cup has 3.5 g of protein.  Muffins - 1 small muffin (2 oz) has 3 g of protein.  Oatmeal (cooked) - 1/2 cup has 3 g of protein.  Potato (baked with skin) - 1 medium potato has 3 g of protein.  Rice (cooked) - 1/2 cup has 2.5-3.5 g of protein.  Sour cream - 1/2 cup has 2.5 g of protein.  Spinach (cooked) - 1/2 cup has 3 g of protein.  Squash (cooked) - 1/2 cup has 1.5 g of protein.  Actual amounts of protein may be different depending on processing. Talk with your health care provider or dietitian about what foods are recommended for you. This information is not intended to replace advice given to you by your health care provider. Make sure you discuss any questions you have with your health care provider. Document Released: 11/05/2015 Document Revised: 04/16/2016 Document Reviewed: 04/16/2016 Elsevier Interactive Patient Education  [employer].

## 2017-07-03 LAB — URINE CULTURE
MICRO NUMBER: 81277612
RESULT: NO GROWTH
SPECIMEN QUALITY:: ADEQUATE

## 2017-07-09 ENCOUNTER — Encounter: Payer: Self-pay | Admitting: Internal Medicine

## 2017-07-16 ENCOUNTER — Other Ambulatory Visit: Payer: Self-pay | Admitting: Emergency Medicine

## 2017-07-16 DIAGNOSIS — Z760 Encounter for issue of repeat prescription: Secondary | ICD-10-CM

## 2017-07-16 MED ORDER — ATOMOXETINE HCL 100 MG PO CAPS: 100.0000 mg | ORAL_CAPSULE | Freq: Every day | ORAL | 0 refills | Status: DC

## 2017-07-16 MED ORDER — VARENICLINE TARTRATE 1 MG PO TABS: 1.0000 mg | ORAL_TABLET | Freq: Two times a day (BID) | ORAL | 0 refills | Status: DC

## 2017-07-16 NOTE — Telephone Encounter (Signed)
Pharmacy requesting 90 day supply.  Please advise

## 2017-08-15 ENCOUNTER — Encounter: Payer: Self-pay | Admitting: Internal Medicine

## 2017-08-27 ENCOUNTER — Ambulatory Visit (AMBULATORY_SURGERY_CENTER): Payer: Self-pay

## 2017-08-27 VITALS — Ht 60.0 in | Wt 142.0 lb

## 2017-08-27 DIAGNOSIS — Z8371 Family history of colonic polyps: Secondary | ICD-10-CM

## 2017-08-27 MED ORDER — NA SULFATE-K SULFATE-MG SULF 17.5-3.13-1.6 GM/177ML PO SOLN: 1.0000 | Freq: Once | ORAL | 0 refills | Status: AC

## 2017-08-27 NOTE — Progress Notes (Signed)
Per pt, no allergies to soy or egg products.Pt not taking any weight loss meds or using  O2 at home.  Pt refused emmi video. 

## 2017-09-03 ENCOUNTER — Encounter: Payer: Self-pay | Admitting: Internal Medicine

## 2017-09-10 ENCOUNTER — Ambulatory Visit (AMBULATORY_SURGERY_CENTER): Payer: Medicare Other | Admitting: Internal Medicine

## 2017-09-10 ENCOUNTER — Encounter: Payer: Self-pay | Admitting: Internal Medicine

## 2017-09-10 ENCOUNTER — Other Ambulatory Visit: Payer: Self-pay

## 2017-09-10 VITALS — BP 96/68 | HR 94 | Temp 98.4°F | Resp 20 | Ht 60.0 in | Wt 142.0 lb

## 2017-09-10 DIAGNOSIS — Z1211 Encounter for screening for malignant neoplasm of colon: Secondary | ICD-10-CM | POA: Diagnosis present

## 2017-09-10 DIAGNOSIS — D124 Benign neoplasm of descending colon: Secondary | ICD-10-CM

## 2017-09-10 DIAGNOSIS — Z8371 Family history of colonic polyps: Secondary | ICD-10-CM

## 2017-09-10 DIAGNOSIS — Z1212 Encounter for screening for malignant neoplasm of rectum: Secondary | ICD-10-CM

## 2017-09-10 MED ORDER — SODIUM CHLORIDE 0.9 % IV SOLN: 500.0000 mL | Freq: Once | INTRAVENOUS | Status: DC

## 2017-09-10 NOTE — Progress Notes (Signed)
Report given to PACU, vss 

## 2017-09-10 NOTE — Progress Notes (Signed)
Pt. Hands increasingly appear bluish. Facial complexion pink.  Pt up to bathroom.  No shortness of breath or any difficulty breathing. Dr. Hilarie Fredrickson out to talk with pt.  He assessed her hands and breath sounds.  He advised that if her hands become discolored again she may want to have her family physician check them out. He assured her and care partner that she was fine for discharge.  Unable to monitior 02 sats continuously while in recovery.

## 2017-09-10 NOTE — Progress Notes (Signed)
Called to room to assist during endoscopic procedure.  Patient ID and intended procedure confirmed with present staff. Received instructions for my participation in the procedure from the performing physician.  

## 2017-09-10 NOTE — Patient Instructions (Signed)
YOU HAD AN ENDOSCOPIC PROCEDURE TODAY AT THE [facility]:   Refer to the procedure report that was given to you for any specific questions about what was found during the examination.  If the procedure report does not answer your questions, please call your gastroenterologist to clarify.  If you requested that your care partner not be given the details of your procedure findings, then the procedure report has been included in a sealed envelope for you to review at your convenience later.  YOU SHOULD EXPECT: Some feelings of bloating in the abdomen. Passage of more gas than usual.  Walking can help get rid of the air that was put into your GI tract during the procedure and reduce the bloating. If you had a lower endoscopy (such as a colonoscopy or flexible sigmoidoscopy) you may notice spotting of blood in your stool or on the toilet paper. If you underwent a bowel prep for your procedure, you may not have a normal bowel movement for a few days.  Please Note:  You might notice some irritation and congestion in your nose or some drainage.  This is from the oxygen used during your procedure.  There is no need for concern and it should clear up in a day or so.  SYMPTOMS TO REPORT IMMEDIATELY:   Following lower endoscopy (colonoscopy or flexible sigmoidoscopy):  Excessive amounts of blood in the stool  Significant tenderness or worsening of abdominal pains  Swelling of the abdomen that is new, acute  Fever of 100F or higher   For urgent or emergent issues, a gastroenterologist can be reached at any hour by calling [phone].   DIET:  We do recommend a small meal at first, but then you may proceed to your regular diet.  Drink plenty of fluids but you should avoid alcoholic beverages for 24 hours.  ACTIVITY:  You should plan to take it easy for the rest of today and you should NOT DRIVE or use heavy machinery until tomorrow (because of the sedation medicines used during the test).     FOLLOW UP: Our staff will call the number listed on your records the next business day following your procedure to check on you and address any questions or concerns that you may have regarding the information given to you following your procedure. If we do not reach you, we will leave a message.  However, if you are feeling well and you are not experiencing any problems, there is no need to return our call.  We will assume that you have returned to your regular daily activities without incident.  If any biopsies were taken you will be contacted by phone or by letter within the next 1-3 weeks.  Please call us at [phone] if you have not heard about the biopsies in 3 weeks.    SIGNATURES/CONFIDENTIALITY: You and/or your care partner have signed paperwork which will be entered into your electronic medical record.  These signatures attest to the fact that that the information above on your After Visit Summary has been reviewed and is understood.  Full responsibility of the confidentiality of this discharge information lies with you and/or your care-partner.  Polyp, diverticulosis information given. 

## 2017-09-10 NOTE — Op Note (Signed)
[name]: Sheryl [name] Procedure Date: 09/10/2017 11:08 AM MRN: [id number] Endoscopist: Jerene Bears , MD Age: 67 Referring MD:  Date of Birth: 1950/12/08 Gender: Female Account #: [id number] Procedure:                Colonoscopy Indications:              Screening for colorectal malignant neoplasm Medicines:                Monitored Anesthesia Care Procedure:                Pre-Anesthesia Assessment:                           - Prior to the procedure, a History and Physical                            was performed, and patient medications and                            allergies were reviewed. The patient's tolerance of                            previous anesthesia was also reviewed. The risks                            and benefits of the procedure and the sedation                            options and risks were discussed with the patient.                            All questions were answered, and informed consent                            was obtained. Prior Anticoagulants: The patient has                            taken no previous anticoagulant or antiplatelet                            agents. ASA Grade Assessment: II - A patient with                            mild systemic disease. After reviewing the risks                            and benefits, the patient was deemed in                            satisfactory condition to undergo the procedure.                           After obtaining informed consent, the colonoscope  was passed under direct vision. Throughout the                            procedure, the patient's blood pressure, pulse, and                            oxygen saturations were monitored continuously. The                            Model PCF-[id number]) scope was introduced                            through the anus and advanced to the the cecum,                            identified by  appendiceal orifice and ileocecal                            valve. The colonoscopy was performed without                            difficulty. The patient tolerated the procedure                            well. The quality of the bowel preparation was                            good. The ileocecal valve, appendiceal orifice, and                            rectum were photographed. Scope In: 11:11:39 AM Scope Out: 11:21:09 AM Scope Withdrawal Time: 0 hours 7 minutes 8 seconds  Total Procedure Duration: 0 hours 9 minutes 30 seconds  Findings:                 The digital rectal exam was normal.                           A 3 mm polyp was found in the descending colon. The                            polyp was sessile. The polyp was removed with a                            cold biopsy forceps. Resection and retrieval were                            complete.                           Multiple small and large-mouthed diverticula were                            found in the sigmoid colon.  The exam was otherwise without abnormality on                            direct and retroflexion views. Complications:            No immediate complications. Estimated Blood Loss:     Estimated blood loss was minimal. Impression:               - One 3 mm polyp in the descending colon, removed                            with a cold biopsy forceps. Resected and retrieved.                           - Moderate diverticulosis in the sigmoid colon.                           - The examination was otherwise normal on direct                            and retroflexion views. Recommendation:           - Patient has a contact number available for                            emergencies. The signs and symptoms of potential                            delayed complications were discussed with the                            patient. Return to normal activities tomorrow.                             Written discharge instructions were provided to the                            patient.                           - Resume previous diet.                           - Continue present medications.                           - Await pathology results.                           - Repeat colonoscopy is recommended. The                            colonoscopy date will be determined after pathology                            results from today's exam become available for  review. Jerene Bears, MD 09/10/2017 11:25:29 AM This report has been signed electronically.

## 2017-09-10 NOTE — Progress Notes (Signed)
Pt's states no medical or surgical changes since previsit or office visit. 

## 2017-09-11 ENCOUNTER — Telehealth: Payer: Self-pay | Admitting: *Deleted

## 2017-09-11 ENCOUNTER — Telehealth: Payer: Self-pay

## 2017-09-11 NOTE — Telephone Encounter (Signed)
  Follow up Call-  Call back number 09/10/2017  Post procedure Call Back phone  # [phone]  Permission to leave phone message Yes  Some recent data might be hidden     [facility]

## 2017-09-11 NOTE — Telephone Encounter (Signed)
No answer for post procedure call back second attempt. Left message for patient to call with questions and concerns. SM

## 2017-09-13 ENCOUNTER — Encounter: Payer: Self-pay | Admitting: Internal Medicine

## 2017-09-26 ENCOUNTER — Other Ambulatory Visit: Payer: Self-pay | Admitting: Emergency Medicine

## 2017-09-26 MED ORDER — FLUOXETINE HCL 20 MG PO CAPS: ORAL_CAPSULE | ORAL | 0 refills | Status: DC

## 2017-10-17 ENCOUNTER — Other Ambulatory Visit: Payer: Self-pay | Admitting: Internal Medicine

## 2017-10-17 DIAGNOSIS — E7800 Pure hypercholesterolemia, unspecified: Secondary | ICD-10-CM

## 2017-10-28 ENCOUNTER — Other Ambulatory Visit: Payer: Self-pay | Admitting: Internal Medicine

## 2017-10-28 DIAGNOSIS — Z760 Encounter for issue of repeat prescription: Secondary | ICD-10-CM

## 2017-10-28 NOTE — Telephone Encounter (Signed)
Last filled 07/16/17, last OV 06/19/17. Has upcoming appt on 10/31/17

## 2017-10-30 NOTE — Progress Notes (Signed)
Subjective:    Patient ID: Sheryl [name], female    DOB: Mar 09, 1951, 67 y.o.   MRN: [id number]  HPI She is here for a pre-operative clearance for left knee replacement surgery.  She quit smoking 09/10/17.  She has been coughing more since quitting smoking.  She started having cold symptoms 2 weeks ago.  She denies any obvious fever/chills.  She has nasal congestion, ear pain, postnasal drip, sinus pressure, productive cough, shortness of breath and wheezing.  Her cough is productive of white-yellow sputum.  Her cough and breathing has worsened slightly since her cold symptoms started.  She has had surgery in the past and denies any problems with anesthesia.  She denies a personal or family history of bleeding and blood clots.    She is not exercising regularly.  With her daily activities of living she denies chest pain, palpitations and lightheadedness.  She has sob with strenuous activity, which is chronic.  She has known COPD.  Her breathing improved after quitting smoking and has gotten slightly worse recently with this current cold.  She does have a prescribed inhaler, but is not currently using it.  She last saw pulmonary 1 year ago.    Medications and allergies reviewed with patient and updated if appropriate.  Patient Active Problem List   Diagnosis Date Noted  . CKD (chronic kidney disease) 12/27/2016  . Insomnia 06/29/2016  . Prediabetes 06/29/2016  . 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 COPD (chronic obstructive pulmonary disease) ([place]) 12/09/2014  . Lumbago 07/16/2014  . Bilateral hand pain 07/16/2014  . Nicotine addiction 06/06/2012  . HTN (hypertension) 06/04/2012  . Hyperlipidemia 06/04/2012  . Depression 06/04/2012  . ADD (attention deficit disorder) 06/04/2012  . AR (allergic rhinitis) 06/04/2012    Current Outpatient Medications on File Prior to Visit  Medication Sig Dispense Refill  . albuterol (PROVENTIL HFA;VENTOLIN HFA) 108 (90  Base) MCG/ACT inhaler Inhale 1-2 puffs into the lungs every 6 (six) hours as needed for wheezing or shortness of breath. 1 Inhaler 3  . atomoxetine (STRATTERA) 100 MG capsule TAKE 1 CAPSULE BY MOUTH EVERY DAY 90 capsule 0  . Estradiol 10 MCG TABS vaginal tablet Use 2x/ weekly for vaginal atrophy 8 tablet 11  . FLUoxetine (PROZAC) 20 MG capsule TAKE 1 CAPSULE (20 MG TOTAL) BY MOUTH DAILY. IN ADDITION TO 40 MG FOR TOTAL OF 60 MG DAILY 90 capsule 0  . FLUoxetine (PROZAC) 40 MG capsule TAKE 1 CAPSULE (40 MG TOTAL) BY MOUTH DAILY. IN ADDITION TO 20 MG FOR TOTAL OF 60 MG DAILY 90 capsule 0  . fluticasone (FLONASE) 50 MCG/ACT nasal spray Place 2 sprays into both nostrils daily. (Patient taking differently: Place 2 sprays into both nostrils as needed. ) 16 g 6  . ipratropium (ATROVENT) 0.03 % nasal spray Place 2 sprays into both nostrils 2 (two) times daily. 30 mL 0  . losartan-hydrochlorothiazide (HYZAAR) 100-12.5 MG tablet TAKE 1 TABLET BY MOUTH EVERY DAY 90 tablet 1  . Multiple Vitamin (MULTIVITAMIN) capsule Take 1 capsule by mouth as needed.     . rosuvastatin (CRESTOR) 10 MG tablet TAKE 1 TABLET BY MOUTH EVERY DAY 90 tablet 1  . traZODone (DESYREL) 50 MG tablet TAKE 1 TABLET AT BEDTIME 90 tablet 3  . umeclidinium-vilanterol (ANORO ELLIPTA) 62.5-25 MCG/INH AEPB Inhale 1 puff into the lungs daily. 28 each 0   No current facility-administered medications on file prior to visit.     Past Medical History:  Diagnosis  Date  . Allergy   . Arthritis   . COPD (chronic obstructive pulmonary disease) ([place]) 12/09/2014  . Depression   . Hyperlipidemia   . LGSIL (low grade squamous intraepithelial dysplasia)   . Osteoporosis     Past Surgical History:  Procedure Laterality Date  . JOINT REPLACEMENT     right knee  . MULTIPLE TOOTH EXTRACTIONS    . REPLACEMENT TOTAL KNEE  2013   right    Social History   Socioeconomic History  . Marital status: Divorced    Spouse name: None  . Number of  children: None  . Years of education: None  . Highest education level: None  Social Needs  . Financial resource strain: None  . Food insecurity - worry: None  . Food insecurity - inability: None  . Transportation needs - medical: None  . Transportation needs - non-medical: None  Occupational History  . None  Tobacco Use  . Smoking status: Light Tobacco Smoker    Packs/day: 0.30    Years: 49.00    Pack years: 14.70    Types: Cigarettes  . Smokeless tobacco: Never Used  . Tobacco comment: Trying quit smoking!  Substance and Sexual Activity  . Alcohol use: No    Alcohol/week: 0.0 oz  . Drug use: No  . Sexual activity: No  Other Topics Concern  . None  Social History Narrative  . None    Family History  Problem Relation Age of Onset  . Hyperlipidemia Mother   . Alcohol abuse Father   . Arthritis Father   . Colon polyps Father   . Alcohol abuse Maternal Uncle   . Stroke Maternal Grandmother     Review of Systems  Constitutional: Negative for chills and fever.  HENT: Positive for congestion (clear mucus), ear pain, postnasal drip and sinus pressure. Negative for sore throat (initially - resolved.).   Respiratory: Positive for cough (discolored white- yellowish sputum), shortness of breath and wheezing.   Cardiovascular: Negative for chest pain, palpitations and leg swelling.  Gastrointestinal: Negative for abdominal pain, blood in stool, diarrhea and nausea.       No gerd  Genitourinary: Negative for dysuria and hematuria.  Skin: Negative for rash.  Neurological: Negative for dizziness, light-headedness and headaches.       Objective:   Vitals:   10/31/17 0909  BP: 130/84  Pulse: 98  Resp: 16  Temp: 98.3 F (36.8 C)  SpO2: 96%   Filed Weights   10/31/17 0909  Weight: 142 lb (64.4 kg)   Body mass index is 25.97 kg/m.  Wt Readings from Last 3 Encounters:  10/31/17 142 lb (64.4 kg)  09/10/17 142 lb (64.4 kg)  08/27/17 142 lb (64.4 kg)     Physical  Exam Constitutional: She appears well-developed and well-nourished. No distress.  HENT:  Head: Normocephalic and atraumatic.  Right Ear: External ear normal. Normal ear canal and TM Left Ear: External ear normal.  Normal ear canal and TM Mouth/Throat: Oropharynx is clear and moist.  Eyes: Conjunctivae and EOM are normal.  Neck: Neck supple. No tracheal deviation present. No thyromegaly present.  No carotid bruit  Cardiovascular: Normal rate, regular rhythm and normal heart sounds.   No murmur heard.  No edema. Pulmonary/Chest: Effort normal and breath sounds normal. No respiratory distress. She has no wheezes. She has no rales.  Abdominal: Soft. She exhibits no distension. There is no tenderness.  Lymphadenopathy: She has no cervical adenopathy.  Skin: Skin is warm and dry.  She is not diaphoretic.  Psychiatric: She has a normal mood and affect. Her behavior is normal.        Assessment & Plan:    See Problem List for Assessment and Plan of chronic medical problems.

## 2017-10-31 ENCOUNTER — Ambulatory Visit (INDEPENDENT_AMBULATORY_CARE_PROVIDER_SITE_OTHER)
Admission: RE | Admit: 2017-10-31 | Discharge: 2017-10-31 | Disposition: A | Discharge status: Home / Self Care | Payer: Medicare Other | Source: Ambulatory Visit | Attending: Internal Medicine | Admitting: Internal Medicine

## 2017-10-31 ENCOUNTER — Encounter: Payer: Self-pay | Admitting: Internal Medicine

## 2017-10-31 ENCOUNTER — Ambulatory Visit: Payer: Medicare Other | Admitting: Internal Medicine

## 2017-10-31 VITALS — BP 130/84 | HR 98 | Temp 98.3°F | Resp 16 | Ht 62.0 in | Wt 142.0 lb

## 2017-10-31 DIAGNOSIS — Z01818 Encounter for other preprocedural examination: Secondary | ICD-10-CM | POA: Diagnosis not present

## 2017-10-31 DIAGNOSIS — J209 Acute bronchitis, unspecified: Secondary | ICD-10-CM

## 2017-10-31 MED ORDER — UMECLIDINIUM-VILANTEROL 62.5-25 MCG/INH IN AEPB: 1.0000 | INHALATION_SPRAY | Freq: Every day | RESPIRATORY_TRACT | 5 refills | Status: DC

## 2017-10-31 MED ORDER — DOXYCYCLINE HYCLATE 100 MG PO TABS: 100.0000 mg | ORAL_TABLET | Freq: Two times a day (BID) | ORAL | 0 refills | Status: DC

## 2017-10-31 MED ORDER — HYDROCODONE-HOMATROPINE 5-1.5 MG/5ML PO SYRP: 5.0000 mL | ORAL_SOLUTION | Freq: Three times a day (TID) | ORAL | 0 refills | Status: DC | PRN

## 2017-10-31 NOTE — Assessment & Plan Note (Signed)
Likely bacterial  CXR today to r/o PNA Start doxycycline Prescription cough syrup otc cold medications Rest, fluid Call if no improvement

## 2017-10-31 NOTE — Assessment & Plan Note (Signed)
She has no known coronary artery disease, but does have known COPD.  She does have chronic shortness of breath with exertion due to her COPD.  She quit smoking 2 months ago and her breathing has improved except recently with cold symptoms. No concerning symptoms with ADLs.  She is not able to exercise regularly. Overall medical problems stable and considered low risk for low risk surgery She is cleared for surgery-paperwork sent to Dr. Veverly Fells

## 2017-10-31 NOTE — Patient Instructions (Addendum)
Have a chest xray today.   Medications reviewed and updated.  Changes include taking an antibiotic and cough syrup for your cold.  Start the [employer] inhaler daily.    Your prescription(s) have been submitted to your pharmacy. Please take as directed and contact our office if you believe you are having problem(s) with the medication(s).

## 2017-11-08 ENCOUNTER — Telehealth: Payer: Self-pay | Admitting: Internal Medicine

## 2017-11-08 NOTE — Telephone Encounter (Signed)
Copied from [facility] [id number]. Topic: Quick Communication - See Telephone Encounter >> Nov 08, 2017  1:33 PM Hewitt Shorts wrote: CRM for notification. See Telephone encounter for: 11/08/17.pt is wanting to know what to do next since she is still having a cough and her nose is running  Best number [phone]  [facility] [address]

## 2017-11-08 NOTE — Telephone Encounter (Signed)
Have her try taking Mucinex religiously and giving a few more days.  Since she just finished an antibiotic I hate to try another one.  She should be using her inhalers regularly.

## 2017-11-08 NOTE — Telephone Encounter (Signed)
Pt stated that the cough is productive with a clear to yellow color. Pt does not feel that she has a fever.

## 2017-11-08 NOTE — Telephone Encounter (Signed)
She took 10 days of doxycycline - the runny nose she can take claritin for.  The cough may be from quitting smoking.  Is it productive?  Are there any fevers?

## 2017-11-11 NOTE — Telephone Encounter (Signed)
Pt informed of PCP response and stated understanding.  

## 2017-12-12 NOTE — H&P (Signed)
Sheryl [name] is an 67 y.o. female.    Chief Complaint: left knee pain  HPI: Pt is a 67 y.o. female complaining of left knee pain for multiple years. Pain had continually increased since the beginning. X-rays in the clinic show end-stage arthritic changes of the left knee. Pt has tried various conservative treatments which have failed to alleviate their symptoms, including injections and therapy. Various options are discussed with the patient. Risks, benefits and expectations were discussed with the patient. Patient understand the risks, benefits and expectations and wishes to proceed with surgery.   PCP:  Binnie Rail, MD  D/C Plans: Home  PMH: Past Medical History:  Diagnosis Date  . Allergy   . Arthritis   . COPD (chronic obstructive pulmonary disease) ([place]) 12/09/2014  . Depression   . Hyperlipidemia   . LGSIL (low grade squamous intraepithelial dysplasia)   . Osteoporosis     PSH: Past Surgical History:  Procedure Laterality Date  . JOINT REPLACEMENT     right knee  . MULTIPLE TOOTH EXTRACTIONS    . REPLACEMENT TOTAL KNEE  2013   right    Social History:  reports that she has been smoking cigarettes.  She has a 14.70 pack-year smoking history. She has never used smokeless tobacco. She reports that she does not drink alcohol or use drugs.  Allergies:  Allergies  Allergen Reactions  . Macrobid [Nitrofurantoin Monohyd Macro]     GI upset    Medications: No current facility-administered medications for this encounter.    Current Outpatient Medications  Medication Sig Dispense Refill  . albuterol (PROVENTIL HFA;VENTOLIN HFA) 108 (90 Base) MCG/ACT inhaler Inhale 1-2 puffs into the lungs every 6 (six) hours as needed for wheezing or shortness of breath. 1 Inhaler 3  . atomoxetine (STRATTERA) 100 MG capsule TAKE 1 CAPSULE BY MOUTH EVERY DAY 90 capsule 0  . doxycycline (VIBRA-TABS) 100 MG tablet Take 1 tablet (100 mg total) by mouth 2 (two) times daily. 20 tablet 0    . Estradiol 10 MCG TABS vaginal tablet Use 2x/ weekly for vaginal atrophy 8 tablet 11  . FLUoxetine (PROZAC) 20 MG capsule TAKE 1 CAPSULE (20 MG TOTAL) BY MOUTH DAILY. IN ADDITION TO 40 MG FOR TOTAL OF 60 MG DAILY 90 capsule 0  . FLUoxetine (PROZAC) 40 MG capsule TAKE 1 CAPSULE (40 MG TOTAL) BY MOUTH DAILY. IN ADDITION TO 20 MG FOR TOTAL OF 60 MG DAILY 90 capsule 0  . fluticasone (FLONASE) 50 MCG/ACT nasal spray Place 2 sprays into both nostrils daily. (Patient taking differently: Place 2 sprays into both nostrils as needed. ) 16 g 6  . HYDROcodone-homatropine (HYCODAN) 5-1.5 MG/5ML syrup Take 5 mLs by mouth every 8 (eight) hours as needed for cough. 120 mL 0  . ipratropium (ATROVENT) 0.03 % nasal spray Place 2 sprays into both nostrils 2 (two) times daily. 30 mL 0  . losartan-hydrochlorothiazide (HYZAAR) 100-12.5 MG tablet TAKE 1 TABLET BY MOUTH EVERY DAY 90 tablet 1  . Multiple Vitamin (MULTIVITAMIN) capsule Take 1 capsule by mouth as needed.     . rosuvastatin (CRESTOR) 10 MG tablet TAKE 1 TABLET BY MOUTH EVERY DAY 90 tablet 1  . traZODone (DESYREL) 50 MG tablet TAKE 1 TABLET AT BEDTIME 90 tablet 3  . umeclidinium-vilanterol (ANORO ELLIPTA) 62.5-25 MCG/INH AEPB Inhale 1 puff into the lungs daily. 28 each 5    No results found for this or any previous visit (from the past 48 hour(s)). No results found.  ROS: Pain with rom of the left lower  extremity  Physical Exam:  Alert and oriented 67 y.o. female in no acute distress Cranial nerves 2-12 intact Cervical spine: full rom with no tenderness, nv intact distally Chest: active breath sounds bilaterally, no wheeze rhonchi or rales Heart: regular rate and rhythm, no murmur Abd: non tender non distended with active bowel sounds Hip is stable with rom  Left knee moderate medial and lateral joint line tenderness nv intact distally Antalgic gait No rashes or edema  Assessment/Plan Assessment: left knee end stage  osteoarthritis  Plan: Patient will undergo a left total knee by Dr. Veverly Fells at [facility]. Risks benefits and expectations were discussed with the patient. Patient understand risks, benefits and expectations and wishes to proceed.  Merla Riches PA-C, MPAS [facility] is now [employer] [facility] [address], [place], [place], [place] [address] Phone: [phone] [email or web address] Facebook  [employer]

## 2017-12-13 NOTE — Pre-Procedure Instructions (Signed)
Sheryl [name]  12/13/2017      [id number] [address], [place] [address] Phone: [phone] Fax: [phone]    Your procedure is scheduled on May 10  Report to [facility] at 0830 A.M.  Call this number if you have problems the morning of surgery:  [phone]   Remember:  Do not eat food or drink liquids after midnight.  Continue all medications as directed by your physician except follow these medication instructions before surgery below   Take these medicines the morning of surgery with A SIP OF WATER  albuterol (PROVENTIL HFA;VENTOLIN HFA) Estradiol  doxycycline (VIBRA-TABS)  FLUoxetine (PROZAC) fluticasone (FLONASE)  umeclidinium-vilanterol (ANORO ELLIPTA)   7 days prior to surgery STOP taking any Aspirin(unless otherwise instructed by your surgeon), Aleve, Naproxen, Ibuprofen, Motrin, Advil, Goody's, BC's, all herbal medications, fish oil, and all vitamins    Do not wear jewelry, make-up or nail polish.  Do not wear lotions, powders, or perfumes, or deodorant.  Do not shave 48 hours prior to surgery.    Do not bring valuables to the hospital.  [facility] is not responsible for any belongings or valuables.  Contacts, dentures or bridgework may not be worn into surgery.  Leave your suitcase in the car.  After surgery it may be brought to your room.  For patients admitted to the hospital, discharge time will be determined by your treatment team.  Patients discharged the day of surgery will not be allowed to drive home.    Special instructions:   [place]- Preparing For Surgery  Before surgery, you can play an important role. Because skin is not sterile, your skin needs to be as free of germs as possible. You can reduce the number of germs on your skin by washing with CHG (chlorahexidine gluconate) Soap before surgery.  CHG is an antiseptic cleaner which kills germs and bonds  with the skin to continue killing germs even after washing.  Please do not use if you have an allergy to CHG or antibacterial soaps. If your skin becomes reddened/irritated stop using the CHG.  Do not shave (including legs and underarms) for at least 48 hours prior to first CHG shower. It is OK to shave your face.  Please follow these instructions carefully.   1. Shower the NIGHT BEFORE SURGERY and the MORNING OF SURGERY with CHG.   2. If you chose to wash your hair, wash your hair first as usual with your normal shampoo.  3. After you shampoo, rinse your hair and body thoroughly to remove the shampoo.  4. Use CHG as you would any other liquid soap. You can apply CHG directly to the skin and wash gently with a scrungie or a clean washcloth.   5. Apply the CHG Soap to your body ONLY FROM THE NECK DOWN.  Do not use on open wounds or open sores. Avoid contact with your eyes, ears, mouth and genitals (private parts). Wash Face and genitals (private parts)  with your normal soap.  6. Wash thoroughly, paying special attention to the area where your surgery will be performed.  7. Thoroughly rinse your body with warm water from the neck down.  8. DO NOT shower/wash with your normal soap after using and rinsing off the CHG Soap.  9. Pat yourself dry with a CLEAN TOWEL.  10. Wear CLEAN PAJAMAS to bed the night before surgery, wear comfortable clothes the morning of  surgery  11. Place CLEAN SHEETS on your bed the night of your first shower and DO NOT SLEEP WITH PETS.    Day of Surgery: Do not apply any deodorants/lotions. Please wear clean clothes to the hospital/surgery center.      Please read over the following fact sheets that you were given.

## 2017-12-15 NOTE — Progress Notes (Signed)
Subjective:    Patient ID: Sheryl [name], female    DOB: 01/12/1951, 67 y.o.   MRN: [id number]  HPI The patient is here for follow up.  Hypertension: She is taking her medication daily. She is compliant with a low sodium diet.  She denies chest pain, palpitations, edema, shortness of breath and regular headaches. She is not exercising regularly.  She does not monitor her blood pressure at home.    Hyperlipidemia: She is taking her medication daily. She is compliant with a low fat/cholesterol diet. She is not exercising regularly. She denies myalgias.   ADD:  She is taking her medication as prescribed.  She feels the medication is effective.  She denies side effects, including palpitations, headaches, lightheadedness, decreased appetite and weight loss.   Depression: She is taking her medication daily as prescribed. She denies any side effects from the medication. She feels her depression is well controlled and she is happy with her current dose of medication.   Insomnia:  She is taking the trazodone nightly.  She feels the medication works well.  She does not sleep well without it.   Prediabetes:  She is not compliant with a low sugar/carbohydrate diet.  She is not exercising regularly due to her knee pain.  She will be having her knee replaced in 11 days.   Medications and allergies reviewed with patient and updated if appropriate.  Patient Active Problem List   Diagnosis Date Noted  . Pre-op examination 10/31/2017  . CKD (chronic kidney disease) 12/27/2016  . Insomnia 06/29/2016  . Prediabetes 06/29/2016  . 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 COPD (chronic obstructive pulmonary disease) ([place]) 12/09/2014  . Lumbago 07/16/2014  . Bilateral hand pain 07/16/2014  . Nicotine addiction 06/06/2012  . HTN (hypertension) 06/04/2012  . Hyperlipidemia 06/04/2012  . Depression 06/04/2012  . ADD (attention deficit disorder) 06/04/2012  . AR (allergic  rhinitis) 06/04/2012    Current Outpatient Medications on File Prior to Visit  Medication Sig Dispense Refill  . albuterol (PROVENTIL HFA;VENTOLIN HFA) 108 (90 Base) MCG/ACT inhaler Inhale 1-2 puffs into the lungs every 6 (six) hours as needed for wheezing or shortness of breath. 1 Inhaler 3  . atomoxetine (STRATTERA) 100 MG capsule TAKE 1 CAPSULE BY MOUTH EVERY DAY 90 capsule 0  . Estradiol 10 MCG TABS vaginal tablet Use 2x/ weekly for vaginal atrophy 8 tablet 11  . FLUoxetine (PROZAC) 20 MG capsule TAKE 1 CAPSULE (20 MG TOTAL) BY MOUTH DAILY. IN ADDITION TO 40 MG FOR TOTAL OF 60 MG DAILY 90 capsule 0  . FLUoxetine (PROZAC) 40 MG capsule TAKE 1 CAPSULE (40 MG TOTAL) BY MOUTH DAILY. IN ADDITION TO 20 MG FOR TOTAL OF 60 MG DAILY 90 capsule 0  . fluticasone (FLONASE) 50 MCG/ACT nasal spray Place 2 sprays into both nostrils daily. (Patient taking differently: Place 2 sprays into both nostrils as needed for allergies. ) 16 g 6  . ipratropium (ATROVENT) 0.03 % nasal spray Place 2 sprays into both nostrils 2 (two) times daily. 30 mL 0  . losartan-hydrochlorothiazide (HYZAAR) 100-12.5 MG tablet TAKE 1 TABLET BY MOUTH EVERY DAY 90 tablet 1  . Multiple Vitamin (MULTIVITAMIN) capsule Take 1 capsule by mouth as needed.     . rosuvastatin (CRESTOR) 10 MG tablet TAKE 1 TABLET BY MOUTH EVERY DAY 90 tablet 1  . traZODone (DESYREL) 50 MG tablet TAKE 1 TABLET AT BEDTIME 90 tablet 3  . TURMERIC PO Take 1 capsule by mouth  daily.    . umeclidinium-vilanterol (ANORO ELLIPTA) 62.5-25 MCG/INH AEPB Inhale 1 puff into the lungs daily. 28 each 5   No current facility-administered medications on file prior to visit.     Past Medical History:  Diagnosis Date  . Allergy   . Arthritis   . COPD (chronic obstructive pulmonary disease) ([place]) 12/09/2014  . Depression   . Dyspnea    on exertion  . Hyperlipidemia   . Hypertension   . LGSIL (low grade squamous intraepithelial dysplasia)   . Osteoporosis     Past  Surgical History:  Procedure Laterality Date  . JOINT REPLACEMENT     right knee  . MULTIPLE TOOTH EXTRACTIONS    . REPLACEMENT TOTAL KNEE  2013   right    Social History   Socioeconomic History  . Marital status: Divorced    Spouse name: Not on file  . Number of children: Not on file  . Years of education: Not on file  . Highest education level: Not on file  Occupational History  . Not on file  Social Needs  . Financial resource strain: Not on file  . Food insecurity:    Worry: Not on file    Inability: Not on file  . Transportation needs:    Medical: Not on file    Non-medical: Not on file  Tobacco Use  . Smoking status: Light Tobacco Smoker    Packs/day: 0.25    Years: 49.00    Pack years: 12.25    Types: Cigarettes  . Smokeless tobacco: Never Used  . Tobacco comment: Trying quit smoking!  Substance and Sexual Activity  . Alcohol use: No    Alcohol/week: 0.0 oz  . Drug use: No  . Sexual activity: Never  Lifestyle  . Physical activity:    Days per week: Not on file    Minutes per session: Not on file  . Stress: Not on file  Relationships  . Social connections:    Talks on phone: Not on file    Gets together: Not on file    Attends religious service: Not on file    Active member of club or organization: Not on file    Attends meetings of clubs or organizations: Not on file    Relationship status: Not on file  Other Topics Concern  . Not on file  Social History Narrative  . Not on file    Family History  Problem Relation Age of Onset  . Hyperlipidemia Mother   . Alcohol abuse Father   . Arthritis Father   . Colon polyps Father   . Alcohol abuse Maternal Uncle   . Stroke Maternal Grandmother     Review of Systems  Constitutional: Negative for chills and fever.  Respiratory: Negative for cough, shortness of breath and wheezing.   Cardiovascular: Negative for chest pain, palpitations and leg swelling.  Neurological: Negative for light-headedness  and headaches.       Objective:   Vitals:   12/17/17 0943  BP: 112/70  Pulse: 94  Resp: 16  Temp: 97.9 F (36.6 C)  SpO2: 96%   BP Readings from Last 3 Encounters:  12/17/17 112/70  12/16/17 (!) 142/76  10/31/17 130/84   Wt Readings from Last 3 Encounters:  12/17/17 144 lb (65.3 kg)  12/16/17 142 lb 6.4 oz (64.6 kg)  10/31/17 142 lb (64.4 kg)   Body mass index is 27.21 kg/m.   Physical Exam    Constitutional: Appears well-developed and well-nourished. No  distress.  HENT:  Head: Normocephalic and atraumatic.  Neck: Neck supple. No tracheal deviation present. No thyromegaly present.  No cervical lymphadenopathy Cardiovascular: Normal rate, regular rhythm and normal heart sounds.   No murmur heard. No carotid bruit .  No edema Pulmonary/Chest: Effort normal and breath sounds normal. No respiratory distress. No has no wheezes. No rales.  Skin: Skin is warm and dry. Not diaphoretic.  Psychiatric: Normal mood and affect. Behavior is normal.      Assessment & Plan:    See Problem List for Assessment and Plan of chronic medical problems.

## 2017-12-16 ENCOUNTER — Other Ambulatory Visit: Payer: Self-pay

## 2017-12-16 ENCOUNTER — Encounter (HOSPITAL_COMMUNITY)
Admission: RE | Admit: 2017-12-16 | Discharge: 2017-12-16 | Disposition: A | Discharge status: Home / Self Care | Payer: Medicare Other | Source: Ambulatory Visit | Attending: Orthopedic Surgery | Admitting: Orthopedic Surgery

## 2017-12-16 ENCOUNTER — Encounter (HOSPITAL_COMMUNITY): Payer: Self-pay

## 2017-12-16 DIAGNOSIS — R9431 Abnormal electrocardiogram [ECG] [EKG]: Secondary | ICD-10-CM | POA: Diagnosis not present

## 2017-12-16 DIAGNOSIS — Z01812 Encounter for preprocedural laboratory examination: Secondary | ICD-10-CM | POA: Insufficient documentation

## 2017-12-16 DIAGNOSIS — Z01810 Encounter for preprocedural cardiovascular examination: Secondary | ICD-10-CM | POA: Insufficient documentation

## 2017-12-16 HISTORY — DX: Essential (primary) hypertension: I10

## 2017-12-16 HISTORY — DX: Dyspnea, unspecified: R06.00

## 2017-12-16 LAB — BASIC METABOLIC PANEL
Anion gap: 10 (ref 5–15)
BUN: 19 mg/dL (ref 6–20)
CO2: 23 mmol/L (ref 22–32)
CREATININE: 1.15 mg/dL — AB (ref 0.44–1.00)
Calcium: 8.8 mg/dL — ABNORMAL LOW (ref 8.9–10.3)
Chloride: 104 mmol/L (ref 101–111)
GFR calc Af Amer: 56 mL/min — ABNORMAL LOW (ref >60)
GFR calc non Af Amer: 48 mL/min — ABNORMAL LOW (ref >60)
GLUCOSE: 107 mg/dL — AB (ref 65–99)
POTASSIUM: 4.8 mmol/L (ref 3.5–5.1)
Sodium: 137 mmol/L (ref 135–145)

## 2017-12-16 LAB — SURGICAL PCR SCREEN
MRSA, PCR: NEGATIVE
Staphylococcus aureus: NEGATIVE

## 2017-12-16 LAB — CBC
HEMATOCRIT: 35.8 % — AB (ref 36.0–46.0)
Hemoglobin: 11.9 g/dL — ABNORMAL LOW (ref 12.0–15.0)
MCH: 31.5 pg (ref 26.0–34.0)
MCHC: 33.2 g/dL (ref 30.0–36.0)
MCV: 94.7 fL (ref 78.0–100.0)
PLATELETS: 339 10*3/uL (ref 150–400)
RBC: 3.78 MIL/uL — ABNORMAL LOW (ref 3.87–5.11)
RDW: 13.4 % (ref 11.5–15.5)
WBC: 6.8 10*3/uL (ref 4.0–10.5)

## 2017-12-16 NOTE — Progress Notes (Addendum)
PCP: Dr. Manley Mason @ [facility]  No cardiologist

## 2017-12-17 ENCOUNTER — Encounter: Payer: Self-pay | Admitting: Internal Medicine

## 2017-12-17 ENCOUNTER — Ambulatory Visit: Payer: Medicare Other | Admitting: Internal Medicine

## 2017-12-17 VITALS — BP 112/70 | HR 94 | Temp 97.9°F | Resp 16 | Wt 144.0 lb

## 2017-12-17 DIAGNOSIS — F988 Other specified behavioral and emotional disorders with onset usually occurring in childhood and adolescence: Secondary | ICD-10-CM | POA: Diagnosis not present

## 2017-12-17 DIAGNOSIS — E782 Mixed hyperlipidemia: Secondary | ICD-10-CM

## 2017-12-17 DIAGNOSIS — F32A Depression, unspecified: Secondary | ICD-10-CM

## 2017-12-17 DIAGNOSIS — F329 Major depressive disorder, single episode, unspecified: Secondary | ICD-10-CM

## 2017-12-17 DIAGNOSIS — R7303 Prediabetes: Secondary | ICD-10-CM | POA: Diagnosis not present

## 2017-12-17 DIAGNOSIS — G4700 Insomnia, unspecified: Secondary | ICD-10-CM

## 2017-12-17 DIAGNOSIS — I10 Essential (primary) hypertension: Secondary | ICD-10-CM

## 2017-12-17 MED ORDER — TRAZODONE HCL 50 MG PO TABS: ORAL_TABLET | ORAL | 3 refills | Status: DC

## 2017-12-17 NOTE — Assessment & Plan Note (Signed)
Controlled with trazodone-unable to sleep without it Will refill medication and continue current dose nightly

## 2017-12-17 NOTE — Assessment & Plan Note (Signed)
Continue current dose of statin We will recheck lipid panel at her next visit

## 2017-12-17 NOTE — Assessment & Plan Note (Addendum)
Check a1c at her next visit since she just had blood work done for preop. Low sugar / carb diet Stressed regular exercise, weight loss

## 2017-12-17 NOTE — Assessment & Plan Note (Signed)
Controlled, stable Continue current dose of medication  

## 2017-12-17 NOTE — Assessment & Plan Note (Signed)
BP well controlled Current regimen effective and well tolerated Continue current medications at current doses  

## 2017-12-17 NOTE — Patient Instructions (Addendum)
  Medications reviewed and updated.  No changes recommended at this time.  Your prescription(s) have been submitted to your pharmacy. Please take as directed and contact our office if you believe you are having problem(s) with the medication(s).    Please followup in 6 months   

## 2017-12-26 MED ORDER — TRANEXAMIC ACID 1000 MG/10ML IV SOLN
1000.0000 mg | INTRAVENOUS | Status: AC
  Administered 2017-12-27: 1000 mg via INTRAVENOUS
  Filled 2017-12-26: qty 1100

## 2017-12-27 ENCOUNTER — Other Ambulatory Visit: Payer: Self-pay | Admitting: Internal Medicine

## 2017-12-27 ENCOUNTER — Ambulatory Visit (HOSPITAL_COMMUNITY): Payer: Medicare Other | Admitting: Certified Registered"

## 2017-12-27 ENCOUNTER — Encounter (HOSPITAL_COMMUNITY)
Admission: AD | Disposition: A | Discharge status: Home Health | Payer: Self-pay | Source: Ambulatory Visit | Attending: Orthopedic Surgery

## 2017-12-27 ENCOUNTER — Encounter (HOSPITAL_COMMUNITY): Payer: Self-pay | Admitting: *Deleted

## 2017-12-27 ENCOUNTER — Inpatient Hospital Stay (HOSPITAL_COMMUNITY): Payer: Medicare Other

## 2017-12-27 ENCOUNTER — Other Ambulatory Visit: Payer: Self-pay

## 2017-12-27 ENCOUNTER — Inpatient Hospital Stay (HOSPITAL_COMMUNITY)
Admission: AD | Admit: 2017-12-27 | Discharge: 2017-12-29 | DRG: 470 | Disposition: A | Discharge status: Home Health | Payer: Medicare Other | Source: Ambulatory Visit | Attending: Orthopedic Surgery | Admitting: Orthopedic Surgery

## 2017-12-27 DIAGNOSIS — M810 Age-related osteoporosis without current pathological fracture: Secondary | ICD-10-CM | POA: Diagnosis present

## 2017-12-27 DIAGNOSIS — Z7951 Long term (current) use of inhaled steroids: Secondary | ICD-10-CM | POA: Diagnosis not present

## 2017-12-27 DIAGNOSIS — E785 Hyperlipidemia, unspecified: Secondary | ICD-10-CM | POA: Diagnosis not present

## 2017-12-27 DIAGNOSIS — Z881 Allergy status to other antibiotic agents status: Secondary | ICD-10-CM | POA: Diagnosis not present

## 2017-12-27 DIAGNOSIS — Z96651 Presence of right artificial knee joint: Secondary | ICD-10-CM | POA: Diagnosis not present

## 2017-12-27 DIAGNOSIS — Z760 Encounter for issue of repeat prescription: Secondary | ICD-10-CM

## 2017-12-27 DIAGNOSIS — F17210 Nicotine dependence, cigarettes, uncomplicated: Secondary | ICD-10-CM | POA: Diagnosis not present

## 2017-12-27 DIAGNOSIS — J449 Chronic obstructive pulmonary disease, unspecified: Secondary | ICD-10-CM | POA: Diagnosis present

## 2017-12-27 DIAGNOSIS — M1712 Unilateral primary osteoarthritis, left knee: Principal | ICD-10-CM | POA: Diagnosis present

## 2017-12-27 DIAGNOSIS — Z79890 Hormone replacement therapy (postmenopausal): Secondary | ICD-10-CM

## 2017-12-27 DIAGNOSIS — I10 Essential (primary) hypertension: Secondary | ICD-10-CM | POA: Diagnosis not present

## 2017-12-27 DIAGNOSIS — Z96652 Presence of left artificial knee joint: Secondary | ICD-10-CM

## 2017-12-27 HISTORY — DX: Prediabetes: R73.03

## 2017-12-27 HISTORY — PX: TOTAL KNEE ARTHROPLASTY: SHX125

## 2017-12-27 SURGERY — ARTHROPLASTY, KNEE, TOTAL
Anesthesia: Spinal | Site: Knee | Laterality: Left

## 2017-12-27 MED ORDER — FLUOXETINE HCL 20 MG PO CAPS
40.0000 mg | ORAL_CAPSULE | Freq: Every day | ORAL | Status: DC
  Administered 2017-12-28 – 2017-12-29 (×2): 40 mg via ORAL
  Filled 2017-12-27 (×2): qty 2

## 2017-12-27 MED ORDER — CHLORHEXIDINE GLUCONATE 4 % EX LIQD: 60.0000 mL | Freq: Once | CUTANEOUS | Status: DC

## 2017-12-27 MED ORDER — FENTANYL CITRATE (PF) 100 MCG/2ML IJ SOLN: 25.0000 ug | INTRAMUSCULAR | Status: DC | PRN

## 2017-12-27 MED ORDER — FLUTICASONE PROPIONATE 50 MCG/ACT NA SUSP
2.0000 | NASAL | Status: DC | PRN
  Filled 2017-12-27: qty 16

## 2017-12-27 MED ORDER — ONDANSETRON HCL 4 MG/2ML IJ SOLN: 4.0000 mg | Freq: Once | INTRAMUSCULAR | Status: DC | PRN

## 2017-12-27 MED ORDER — METOCLOPRAMIDE HCL 5 MG PO TABS: 5.0000 mg | ORAL_TABLET | Freq: Three times a day (TID) | ORAL | Status: DC | PRN

## 2017-12-27 MED ORDER — MIDAZOLAM HCL 2 MG/2ML IJ SOLN
INTRAMUSCULAR | Status: AC
  Filled 2017-12-27: qty 2

## 2017-12-27 MED ORDER — ACETAMINOPHEN 325 MG PO TABS: 325.0000 mg | ORAL_TABLET | Freq: Four times a day (QID) | ORAL | Status: DC | PRN

## 2017-12-27 MED ORDER — MULTIVITAMINS PO CAPS: 1.0000 | ORAL_CAPSULE | Freq: Every day | ORAL | Status: DC

## 2017-12-27 MED ORDER — HYDROCODONE-ACETAMINOPHEN 7.5-325 MG PO TABS
ORAL_TABLET | ORAL | Status: AC
  Administered 2017-12-27: 1 via ORAL
  Filled 2017-12-27: qty 1

## 2017-12-27 MED ORDER — HYDROCODONE-ACETAMINOPHEN 5-325 MG PO TABS: 1.0000 | ORAL_TABLET | ORAL | Status: DC | PRN

## 2017-12-27 MED ORDER — OXYCODONE HCL 5 MG PO TABS
5.0000 mg | ORAL_TABLET | Freq: Once | ORAL | Status: AC | PRN
  Administered 2017-12-27: 5 mg via ORAL

## 2017-12-27 MED ORDER — METOCLOPRAMIDE HCL 5 MG/ML IJ SOLN: 5.0000 mg | Freq: Three times a day (TID) | INTRAMUSCULAR | Status: DC | PRN

## 2017-12-27 MED ORDER — PHENYLEPHRINE HCL 10 MG/ML IJ SOLN
INTRAVENOUS | Status: DC | PRN
  Administered 2017-12-27: 25 ug/min via INTRAVENOUS

## 2017-12-27 MED ORDER — ATOMOXETINE HCL 60 MG PO CAPS
60.0000 mg | ORAL_CAPSULE | Freq: Every day | ORAL | Status: DC
  Administered 2017-12-28 – 2017-12-29 (×2): 60 mg via ORAL
  Filled 2017-12-27 (×3): qty 1

## 2017-12-27 MED ORDER — ASPIRIN 81 MG PO CHEW
81.0000 mg | CHEWABLE_TABLET | Freq: Two times a day (BID) | ORAL | Status: DC
  Administered 2017-12-27 – 2017-12-29 (×4): 81 mg via ORAL
  Filled 2017-12-27 (×4): qty 1

## 2017-12-27 MED ORDER — TRAZODONE HCL 50 MG PO TABS
50.0000 mg | ORAL_TABLET | Freq: Every day | ORAL | Status: DC
  Administered 2017-12-27 – 2017-12-28 (×2): 50 mg via ORAL
  Filled 2017-12-27 (×2): qty 1

## 2017-12-27 MED ORDER — HYDROCODONE-ACETAMINOPHEN 7.5-325 MG PO TABS
1.0000 | ORAL_TABLET | ORAL | Status: DC | PRN
  Administered 2017-12-27 (×2): 1 via ORAL
  Administered 2017-12-28: 2 via ORAL
  Administered 2017-12-28 (×3): 1 via ORAL
  Administered 2017-12-29: 2 via ORAL
  Filled 2017-12-27 (×4): qty 1
  Filled 2017-12-27 (×2): qty 2

## 2017-12-27 MED ORDER — POLYETHYLENE GLYCOL 3350 17 G PO PACK: 17.0000 g | PACK | Freq: Every day | ORAL | Status: DC | PRN

## 2017-12-27 MED ORDER — MIDAZOLAM HCL 2 MG/2ML IJ SOLN
INTRAMUSCULAR | Status: AC
  Administered 2017-12-27: 1 mg
  Filled 2017-12-27: qty 2

## 2017-12-27 MED ORDER — ONDANSETRON HCL 4 MG PO TABS: 4.0000 mg | ORAL_TABLET | Freq: Four times a day (QID) | ORAL | Status: DC | PRN

## 2017-12-27 MED ORDER — TRANEXAMIC ACID 1000 MG/10ML IV SOLN
1000.0000 mg | Freq: Once | INTRAVENOUS | Status: AC
  Administered 2017-12-27: 1000 mg via INTRAVENOUS
  Filled 2017-12-27 (×2): qty 10

## 2017-12-27 MED ORDER — PROPOFOL 10 MG/ML IV BOLUS
INTRAVENOUS | Status: AC
  Filled 2017-12-27: qty 20

## 2017-12-27 MED ORDER — ASPIRIN 81 MG PO CHEW: 81.0000 mg | CHEWABLE_TABLET | Freq: Two times a day (BID) | ORAL | 0 refills | Status: DC

## 2017-12-27 MED ORDER — MENTHOL 3 MG MT LOZG: 1.0000 | LOZENGE | OROMUCOSAL | Status: DC | PRN

## 2017-12-27 MED ORDER — SODIUM CHLORIDE 0.9 % IV SOLN
INTRAVENOUS | Status: DC
  Administered 2017-12-27: 19:00:00 via INTRAVENOUS

## 2017-12-27 MED ORDER — LACTATED RINGERS IV SOLN
INTRAVENOUS | Status: DC
  Administered 2017-12-27 (×2): via INTRAVENOUS

## 2017-12-27 MED ORDER — LOSARTAN POTASSIUM-HCTZ 100-12.5 MG PO TABS: 1.0000 | ORAL_TABLET | Freq: Every day | ORAL | Status: DC

## 2017-12-27 MED ORDER — ADULT MULTIVITAMIN W/MINERALS CH
1.0000 | ORAL_TABLET | Freq: Every day | ORAL | Status: DC
  Administered 2017-12-27 – 2017-12-29 (×3): 1 via ORAL
  Filled 2017-12-27 (×3): qty 1

## 2017-12-27 MED ORDER — 0.9 % SODIUM CHLORIDE (POUR BTL) OPTIME
TOPICAL | Status: DC | PRN
  Administered 2017-12-27: 1000 mL

## 2017-12-27 MED ORDER — FENTANYL CITRATE (PF) 250 MCG/5ML IJ SOLN
INTRAMUSCULAR | Status: AC
  Filled 2017-12-27: qty 5

## 2017-12-27 MED ORDER — PHENYLEPHRINE 40 MCG/ML (10ML) SYRINGE FOR IV PUSH (FOR BLOOD PRESSURE SUPPORT)
PREFILLED_SYRINGE | INTRAVENOUS | Status: AC
  Filled 2017-12-27: qty 10

## 2017-12-27 MED ORDER — UMECLIDINIUM-VILANTEROL 62.5-25 MCG/INH IN AEPB
1.0000 | INHALATION_SPRAY | Freq: Every day | RESPIRATORY_TRACT | Status: DC
Start: 2017-12-27 — End: 2017-12-29
  Administered 2017-12-28 – 2017-12-29 (×2): 1 via RESPIRATORY_TRACT
  Filled 2017-12-27 (×2): qty 14

## 2017-12-27 MED ORDER — DOCUSATE SODIUM 100 MG PO CAPS
100.0000 mg | ORAL_CAPSULE | Freq: Two times a day (BID) | ORAL | Status: DC
  Administered 2017-12-27 – 2017-12-29 (×4): 100 mg via ORAL
  Filled 2017-12-27 (×4): qty 1

## 2017-12-27 MED ORDER — BUPIVACAINE-EPINEPHRINE (PF) 0.25% -1:200000 IJ SOLN
INTRAMUSCULAR | Status: AC
  Filled 2017-12-27: qty 30

## 2017-12-27 MED ORDER — CEFAZOLIN SODIUM-DEXTROSE 2-4 GM/100ML-% IV SOLN
2.0000 g | Freq: Four times a day (QID) | INTRAVENOUS | Status: AC
  Administered 2017-12-28: 2 g via INTRAVENOUS
  Filled 2017-12-27 (×2): qty 100

## 2017-12-27 MED ORDER — SODIUM CHLORIDE 0.9 % IR SOLN
Status: DC | PRN
  Administered 2017-12-27: 3000 mL

## 2017-12-27 MED ORDER — CEFAZOLIN SODIUM-DEXTROSE 2-3 GM-%(50ML) IV SOLR
INTRAVENOUS | Status: DC | PRN
  Administered 2017-12-27: 2 g via INTRAVENOUS

## 2017-12-27 MED ORDER — CEFAZOLIN SODIUM-DEXTROSE 2-4 GM/100ML-% IV SOLN: 2.0000 g | INTRAVENOUS | Status: DC

## 2017-12-27 MED ORDER — FENTANYL CITRATE (PF) 100 MCG/2ML IJ SOLN
INTRAMUSCULAR | Status: AC
  Administered 2017-12-27: 50 ug
  Filled 2017-12-27: qty 2

## 2017-12-27 MED ORDER — ROSUVASTATIN CALCIUM 10 MG PO TABS
10.0000 mg | ORAL_TABLET | Freq: Every day | ORAL | Status: DC
  Administered 2017-12-27 – 2017-12-29 (×3): 10 mg via ORAL
  Filled 2017-12-27 (×3): qty 1

## 2017-12-27 MED ORDER — METHOCARBAMOL 500 MG PO TABS
500.0000 mg | ORAL_TABLET | Freq: Four times a day (QID) | ORAL | Status: DC | PRN
  Administered 2017-12-27 – 2017-12-29 (×6): 500 mg via ORAL
  Filled 2017-12-27 (×5): qty 1

## 2017-12-27 MED ORDER — PROPOFOL 500 MG/50ML IV EMUL
INTRAVENOUS | Status: DC | PRN
  Administered 2017-12-27: 25 ug/kg/min via INTRAVENOUS

## 2017-12-27 MED ORDER — METHOCARBAMOL 500 MG PO TABS
ORAL_TABLET | ORAL | Status: AC
  Filled 2017-12-27: qty 1

## 2017-12-27 MED ORDER — OXYCODONE HCL 5 MG/5ML PO SOLN: 5.0000 mg | Freq: Once | ORAL | Status: AC | PRN

## 2017-12-27 MED ORDER — FENTANYL CITRATE (PF) 100 MCG/2ML IJ SOLN
50.0000 ug | Freq: Once | INTRAMUSCULAR | Status: AC
  Administered 2017-12-27: 50 ug via INTRAVENOUS

## 2017-12-27 MED ORDER — MIDAZOLAM HCL 2 MG/2ML IJ SOLN: 1.0000 mg | Freq: Once | INTRAMUSCULAR | Status: DC

## 2017-12-27 MED ORDER — METHOCARBAMOL 500 MG PO TABS: 500.0000 mg | ORAL_TABLET | Freq: Four times a day (QID) | ORAL | 1 refills | Status: DC | PRN

## 2017-12-27 MED ORDER — LIDOCAINE HCL (CARDIAC) PF 100 MG/5ML IV SOSY
PREFILLED_SYRINGE | INTRAVENOUS | Status: DC | PRN
  Administered 2017-12-27: 50 mg via INTRAVENOUS

## 2017-12-27 MED ORDER — TRAMADOL HCL 50 MG PO TABS
ORAL_TABLET | ORAL | Status: AC
  Filled 2017-12-27: qty 1

## 2017-12-27 MED ORDER — OXYCODONE HCL 5 MG PO TABS
ORAL_TABLET | ORAL | Status: AC
  Filled 2017-12-27: qty 1

## 2017-12-27 MED ORDER — CEFAZOLIN SODIUM-DEXTROSE 2-4 GM/100ML-% IV SOLN
INTRAVENOUS | Status: AC
  Filled 2017-12-27: qty 100

## 2017-12-27 MED ORDER — MORPHINE SULFATE (PF) 2 MG/ML IV SOLN: 0.5000 mg | INTRAVENOUS | Status: DC | PRN

## 2017-12-27 MED ORDER — ALBUTEROL SULFATE (2.5 MG/3ML) 0.083% IN NEBU: 3.0000 mL | INHALATION_SOLUTION | Freq: Four times a day (QID) | RESPIRATORY_TRACT | Status: DC | PRN

## 2017-12-27 MED ORDER — TRAMADOL HCL 50 MG PO TABS
50.0000 mg | ORAL_TABLET | Freq: Four times a day (QID) | ORAL | Status: DC
  Administered 2017-12-28 – 2017-12-29 (×7): 50 mg via ORAL
  Filled 2017-12-27 (×8): qty 1

## 2017-12-27 MED ORDER — PHENOL 1.4 % MT LIQD: 1.0000 | OROMUCOSAL | Status: DC | PRN

## 2017-12-27 MED ORDER — LOSARTAN POTASSIUM 50 MG PO TABS
100.0000 mg | ORAL_TABLET | Freq: Every day | ORAL | Status: DC
  Administered 2017-12-28 – 2017-12-29 (×2): 100 mg via ORAL
  Filled 2017-12-27 (×2): qty 2

## 2017-12-27 MED ORDER — HYDROCHLOROTHIAZIDE 12.5 MG PO CAPS
12.5000 mg | ORAL_CAPSULE | Freq: Every day | ORAL | Status: DC
  Administered 2017-12-28 – 2017-12-29 (×2): 12.5 mg via ORAL
  Filled 2017-12-27 (×2): qty 1

## 2017-12-27 MED ORDER — ONDANSETRON HCL 4 MG/2ML IJ SOLN: 4.0000 mg | Freq: Four times a day (QID) | INTRAMUSCULAR | Status: DC | PRN

## 2017-12-27 MED ORDER — FLUOXETINE HCL 20 MG PO CAPS
60.0000 mg | ORAL_CAPSULE | Freq: Every day | ORAL | Status: DC
  Administered 2017-12-27 – 2017-12-29 (×3): 60 mg via ORAL
  Filled 2017-12-27 (×3): qty 3

## 2017-12-27 MED ORDER — METHOCARBAMOL 1000 MG/10ML IJ SOLN
500.0000 mg | Freq: Four times a day (QID) | INTRAVENOUS | Status: DC | PRN
  Filled 2017-12-27: qty 5

## 2017-12-27 MED ORDER — HYDROCODONE-ACETAMINOPHEN 5-325 MG PO TABS: 1.0000 | ORAL_TABLET | Freq: Four times a day (QID) | ORAL | 0 refills | Status: DC | PRN

## 2017-12-27 MED ORDER — FERROUS SULFATE 325 (65 FE) MG PO TABS
325.0000 mg | ORAL_TABLET | Freq: Three times a day (TID) | ORAL | Status: DC
  Administered 2017-12-27 – 2017-12-29 (×6): 325 mg via ORAL
  Filled 2017-12-27 (×6): qty 1

## 2017-12-27 SURGICAL SUPPLY — 60 items
BANDAGE ESMARK 6X9 LF (GAUZE/BANDAGES/DRESSINGS) ×1 IMPLANT
BLADE SAG 18X100X1.27 (BLADE) ×2 IMPLANT
BLADE SAW SGTL 13X75X1.27 (BLADE) ×2 IMPLANT
BNDG CMPR 9X6 STRL LF SNTH (GAUZE/BANDAGES/DRESSINGS) ×1
BNDG CMPR MED 10X6 ELC LF (GAUZE/BANDAGES/DRESSINGS) ×1
BNDG ELASTIC 6X10 VLCR STRL LF (GAUZE/BANDAGES/DRESSINGS) ×2 IMPLANT
BNDG ESMARK 6X9 LF (GAUZE/BANDAGES/DRESSINGS) ×2
BNDG GAUZE ELAST 4 BULKY (GAUZE/BANDAGES/DRESSINGS) ×3 IMPLANT
BOWL SMART MIX CTS (DISPOSABLE) ×2 IMPLANT
CAP KNEE TOTAL 3 SIGMA ×1 IMPLANT
CEMENT HV SMART SET (Cement) ×4 IMPLANT
CLSR STERI-STRIP ANTIMIC 1/2X4 (GAUZE/BANDAGES/DRESSINGS) ×1 IMPLANT
COVER SURGICAL LIGHT HANDLE (MISCELLANEOUS) ×2 IMPLANT
CUFF TOURNIQUET SINGLE 34IN LL (TOURNIQUET CUFF) IMPLANT
CUFF TOURNIQUET SINGLE 44IN (TOURNIQUET CUFF) IMPLANT
DRAPE EXTREMITY T 121X128X90 (DRAPE) ×2 IMPLANT
DRAPE HALF SHEET 40X57 (DRAPES) ×3 IMPLANT
DRAPE U-SHAPE 47X51 STRL (DRAPES) ×2 IMPLANT
DRSG ADAPTIC 3X8 NADH LF (GAUZE/BANDAGES/DRESSINGS) ×2 IMPLANT
DRSG PAD ABDOMINAL 8X10 ST (GAUZE/BANDAGES/DRESSINGS) ×4 IMPLANT
DURAPREP 26ML APPLICATOR (WOUND CARE) ×2 IMPLANT
ELECT CAUTERY BLADE 6.4 (BLADE) ×2 IMPLANT
ELECT REM PT RETURN 9FT ADLT (ELECTROSURGICAL) ×2
ELECTRODE REM PT RTRN 9FT ADLT (ELECTROSURGICAL) ×1 IMPLANT
GAUZE SPONGE 4X4 12PLY STRL (GAUZE/BANDAGES/DRESSINGS) ×2 IMPLANT
GLOVE BIOGEL PI ORTHO PRO 7.5 (GLOVE) ×1
GLOVE BIOGEL PI ORTHO PRO SZ8 (GLOVE) ×1
GLOVE ORTHO TXT STRL SZ7.5 (GLOVE) ×2 IMPLANT
GLOVE PI ORTHO PRO STRL 7.5 (GLOVE) ×1 IMPLANT
GLOVE PI ORTHO PRO STRL SZ8 (GLOVE) ×1 IMPLANT
GLOVE SURG ORTHO 8.5 STRL (GLOVE) ×2 IMPLANT
GOWN STRL REUS W/ TWL XL LVL3 (GOWN DISPOSABLE) ×3 IMPLANT
GOWN STRL REUS W/TWL XL LVL3 (GOWN DISPOSABLE) ×6
HANDPIECE INTERPULSE COAX TIP (DISPOSABLE) ×2
IMMOBILIZER KNEE 20 (SOFTGOODS) ×2
IMMOBILIZER KNEE 20 THIGH 36 (SOFTGOODS) IMPLANT
IMMOBILIZER KNEE 22 UNIV (SOFTGOODS) IMPLANT
KIT BASIN OR (CUSTOM PROCEDURE TRAY) ×2 IMPLANT
KIT MANIFOLD (MISCELLANEOUS) ×2 IMPLANT
KIT TURNOVER KIT B (KITS) ×2 IMPLANT
MANIFOLD NEPTUNE II (INSTRUMENTS) ×2 IMPLANT
NS IRRIG 1000ML POUR BTL (IV SOLUTION) ×2 IMPLANT
PACK TOTAL JOINT (CUSTOM PROCEDURE TRAY) ×2 IMPLANT
PAD ABD 8X10 STRL (GAUZE/BANDAGES/DRESSINGS) ×1 IMPLANT
PAD ARMBOARD 7.5X6 YLW CONV (MISCELLANEOUS) ×4 IMPLANT
SET HNDPC FAN SPRY TIP SCT (DISPOSABLE) ×1 IMPLANT
STRIP CLOSURE SKIN 1/2X4 (GAUZE/BANDAGES/DRESSINGS) ×4 IMPLANT
SUCTION FRAZIER HANDLE 10FR (MISCELLANEOUS) ×1
SUCTION TUBE FRAZIER 10FR DISP (MISCELLANEOUS) ×1 IMPLANT
SUT MNCRL AB 3-0 PS2 18 (SUTURE) ×2 IMPLANT
SUT VIC AB 0 CT1 27 (SUTURE) ×4
SUT VIC AB 0 CT1 27XBRD ANBCTR (SUTURE) ×2 IMPLANT
SUT VIC AB 1 CT1 27 (SUTURE) ×6
SUT VIC AB 1 CT1 27XBRD ANBCTR (SUTURE) ×3 IMPLANT
SUT VIC AB 2-0 CT1 27 (SUTURE) ×4
SUT VIC AB 2-0 CT1 TAPERPNT 27 (SUTURE) ×2 IMPLANT
TOWEL OR 17X24 6PK STRL BLUE (TOWEL DISPOSABLE) ×2 IMPLANT
TOWEL OR 17X26 10 PK STRL BLUE (TOWEL DISPOSABLE) ×2 IMPLANT
TRAY CATH 16FR W/PLASTIC CATH (SET/KITS/TRAYS/PACK) IMPLANT
TRAY FOLEY MTR SLVR 16FR STAT (SET/KITS/TRAYS/PACK) IMPLANT

## 2017-12-27 NOTE — Anesthesia Postprocedure Evaluation (Signed)
Anesthesia Post Note  Patient: Sheryl [name]  Procedure(s) Performed: LEFT TOTAL KNEE ARTHROPLASTY (Left Knee)     Patient location during evaluation: PACU Anesthesia Type: Spinal Level of consciousness: oriented and awake and alert Pain management: pain level controlled Vital Signs Assessment: post-procedure vital signs reviewed and stable Respiratory status: spontaneous breathing, respiratory function stable and patient connected to nasal cannula oxygen Cardiovascular status: blood pressure returned to baseline and stable Postop Assessment: no headache, no backache and no apparent nausea or vomiting Anesthetic complications: no    Last Vitals:  Vitals:   12/27/17 1300 12/27/17 1315  BP: 111/61 (!) 108/55  Pulse: 61 65  Resp: 16 17  Temp:    SpO2: 95% 95%    Last Pain:  Vitals:   12/27/17 1315  TempSrc:   PainSc: 0-No pain                 Kinney Sackmann COKER

## 2017-12-27 NOTE — Anesthesia Preprocedure Evaluation (Addendum)
Anesthesia Evaluation  Patient identified by MRN, date of birth, ID band Patient awake    Reviewed: Allergy & Precautions, NPO status , Patient's Chart, lab work & pertinent test results  Airway Mallampati: II   Neck ROM: Full    Dental  (+) Partial Upper, Dental Advisory Given   Pulmonary Current Smoker,    breath sounds clear to auscultation       Cardiovascular hypertension,  Rhythm:Regular Rate:Normal     Neuro/Psych    GI/Hepatic   Endo/Other    Renal/GU      Musculoskeletal   Abdominal   Peds  Hematology   Anesthesia Other Findings   Reproductive/Obstetrics                           Anesthesia Physical Anesthesia Plan  ASA: III  Anesthesia Plan: Spinal   Post-op Pain Management:  Regional for Post-op pain   Induction:   PONV Risk Score and Plan: Ondansetron and Dexamethasone  Airway Management Planned: Natural Airway and Simple Face Mask  Additional Equipment:   Intra-op Plan:   Post-operative Plan:   Informed Consent: I have reviewed the patients History and Physical, chart, labs and discussed the procedure including the risks, benefits and alternatives for the proposed anesthesia with the patient or authorized representative who has indicated his/her understanding and acceptance.   Dental advisory given  Plan Discussed with: CRNA and Anesthesiologist  Anesthesia Plan Comments:        Anesthesia Quick Evaluation

## 2017-12-27 NOTE — Brief Op Note (Signed)
12/27/2017  12:08 PM  PATIENT:  [name]  67 y.o. female  PRE-OPERATIVE DIAGNOSIS:  Left knee end stage osteoarthritis  POST-OPERATIVE DIAGNOSIS:  Left knee end stage osteoarthritis  PROCEDURE:  Procedure(s): LEFT TOTAL KNEE ARTHROPLASTY (Left) DePuy Sigma RP  SURGEON:  Surgeon(s) and Role:    Netta Cedars, MD - Primary  PHYSICIAN ASSISTANT:   ASSISTANTS: Ventura Bruns, PA-C   ANESTHESIA:   regional and spinal  EBL:  minimal   BLOOD ADMINISTERED:none  DRAINS: none   LOCAL MEDICATIONS USED:  MARCAINE     SPECIMEN:  No Specimen  DISPOSITION OF SPECIMEN:  N/A  COUNTS:  YES  TOURNIQUET:  * Missing tourniquet times found for documented tourniquets in log: [id number] *  DICTATION: .Other Dictation: Dictation Number [id number]  PLAN OF CARE: Admit to inpatient   PATIENT DISPOSITION:  PACU - hemodynamically stable.   Delay start of Pharmacological VTE agent (>24hrs) due to surgical blood loss or risk of bleeding: no

## 2017-12-27 NOTE — Care Management Note (Signed)
Case Management Note  Patient Details  Name: Sheryl [name] MRN: [id number] Date of Birth: March 24, 1951  Subjective/Objective:    67 yr old female s/p left total knee arthroplasty.  Action/Plan: Patient was preoperatively setup with [facility].   Expected Discharge Date:                  Expected Discharge Plan:  [place]  In-House Referral:  NA  Discharge planning Services  CM Consult  Post Acute Care Choice:  Durable Medical Equipment, Home Health Choice offered to:     DME Arranged:  CPM DME Agency:  [employer]  HH Arranged:  PT HH Agency:  [facility] [employer]  Status of Service:  In process, will continue to follow  If discussed at Long Length of Stay Meetings, dates discussed:    Additional Comments:  Ninfa Meeker, RN 12/27/2017, 3:11 PM

## 2017-12-27 NOTE — Anesthesia Procedure Notes (Signed)
Spinal  Start time: 12/27/2017 10:15 AM End time: 12/27/2017 10:25 AM Staffing Anesthesiologist: Roberts Gaudy, MD Performed: anesthesiologist  Preanesthetic Checklist Completed: patient identified, site marked, surgical consent, pre-op evaluation, timeout performed, IV checked, risks and benefits discussed and monitors and equipment checked Spinal Block Patient position: sitting Prep: ChloraPrep Patient monitoring: heart rate, cardiac monitor, continuous pulse ox and blood pressure Approach: midline Location: L3-4 Injection technique: single-shot Needle Needle type: Pencan  Needle gauge: 24 G Assessment Sensory level: T6 Additional Notes 14 mg 0.75% Bupivacaine injected easily

## 2017-12-27 NOTE — Anesthesia Procedure Notes (Addendum)
Anesthesia Regional Block: Adductor canal block   Pre-Anesthetic Checklist: ,, timeout performed, Correct Patient, Correct Site, Correct Laterality, Correct Procedure, Correct Position, site marked, Risks and benefits discussed, pre-op evaluation,  At surgeon's request and post-op pain management  Laterality: Left  Prep: Maximum Sterile Barrier Precautions used, chloraprep       Needles:  Injection technique: Single-shot  Needle Type: Echogenic Stimulator Needle     Needle Length: 9cm  Needle Gauge: 21     Additional Needles:   Procedures:,,,, ultrasound used (permanent image in chart),,,,  Narrative:  Start time: 12/27/2017 9:10 AM End time: 12/27/2017 9:20 AM Injection made incrementally with aspirations every 5 mL.  Performed by: Personally  Anesthesiologist: Roberts Gaudy, MD  Additional Notes: 20 cc 0.75% Ropivacaine with 0.2 mg clonidine injected easily

## 2017-12-27 NOTE — Anesthesia Procedure Notes (Signed)
Procedure Name: MAC Date/Time: 12/27/2017 10:58 AM Performed by: Lavell Luster, CRNA Pre-anesthesia Checklist: Patient identified, Emergency Drugs available, Suction available, Patient being monitored and Timeout performed Patient Re-evaluated:Patient Re-evaluated prior to induction Oxygen Delivery Method: Nasal cannula Preoxygenation: Pre-oxygenation with 100% oxygen Induction Type: IV induction Placement Confirmation: positive ETCO2 and breath sounds checked- equal and bilateral Dental Injury: Teeth and Oropharynx as per pre-operative assessment

## 2017-12-27 NOTE — Interval H&P Note (Signed)
History and Physical Interval Note:  12/27/2017 9:44 AM  Sheryl [name]  has presented today for surgery, with the diagnosis of Left knee end stage osteoarthritis  The various methods of treatment have been discussed with the patient and family. After consideration of risks, benefits and other options for treatment, the patient has consented to  Procedure(s): LEFT TOTAL KNEE ARTHROPLASTY (Left) as a surgical intervention .  The patient's history has been reviewed, patient examined, [name] change in status, stable for surgery.  I have reviewed the patient's chart and labs.  Questions were answered to the patient's satisfaction.     Alixander Rallis,STEVEN R

## 2017-12-27 NOTE — Transfer of Care (Signed)
Immediate Anesthesia Transfer of Care Note  Patient: Sheryl [name]  Procedure(s) Performed: LEFT TOTAL KNEE ARTHROPLASTY (Left Knee)  Patient Location: PACU  Anesthesia Type:MAC  Level of Consciousness: awake, alert  and oriented  Airway & Oxygen Therapy: Patient Spontanous Breathing  Post-op Assessment: Post -op Vital signs reviewed and stable  Post vital signs: stable  Last Vitals:  Vitals Value Taken Time  BP    Temp    Pulse 74 12/27/2017 12:28 PM  Resp 16 12/27/2017 12:29 PM  SpO2 84 % 12/27/2017 12:28 PM  Vitals shown include unvalidated device data.  Last Pain:  Vitals:   12/27/17 0825  TempSrc:   PainSc: 0-No pain      Patients Stated Pain Goal: 8 (67/20/94 7096)  Complications: No apparent anesthesia complications

## 2017-12-27 NOTE — Op Note (Signed)
NAME: Sheryl [name], DELEON MEDICAL RECORD [id number] ACCOUNT [id number] DATE OF BIRTH:November 07, 1950 FACILITY: [facility] LOCATION: [facility] PHYSICIAN:STEVEN Orlena Sheldon, MD  OPERATIVE REPORT  DATE OF PROCEDURE:  12/27/2017  PREOPERATIVE DIAGNOSIS:  Left knee end-stage osteoarthritis.  POSTOPERATIVE DIAGNOSIS:  Left knee end-stage osteoarthritis.  PROCEDURE PERFORMED:  Left total knee arthroplasty using DePuy Sigma rotating platform prosthesis.  ATTENDING SURGEON:  Esmond Plants, MD  ASSISTANT:  Darol Destine, [place], who was scrubbed during the entire procedure and necessary for satisfactory completion of surgery.  ANESTHESIA:  Spinal anesthesia was used plus adductor canal block.  ESTIMATED BLOOD LOSS:  Minimal.  FLUID REPLACEMENT: 1500 mL crystalloid.  INSTRUMENT COUNTS:  Correct.  COMPLICATIONS:  None.  ANTIBIOTICS:  Perioperative antibiotics were given.  TOURNIQUET:  Utilized at 300 mmHg for 70 minutes.  INDICATIONS:  The patient is a 67 year old female with a history of worsening left knee pain secondary to end-stage arthritis.  The patient has had progressive pain and has bone-on-bone changes on x-ray.  Despite conservative management, she has ongoing  pain and functional limitations and desires total knee arthroplasty on the left.  She has had prior total knee on the right back in 2013.  She has done well with that.  She presents now for knee surgery, the risks and benefits having been discussed and  informed consent obtained.  DESCRIPTION OF PROCEDURE:  After an adequate level of anesthesia was achieved, the patient was positioned in the supine position.  Left leg correctly identified.  A nonsterile tourniquet was placed on the proximal thigh.  Left leg sterilely prepped and  draped in the usual manner.  Timeout was called.  We elevated the leg and exsanguinated using Esmarch bandage.  We then placed the knee in flexion and made a longitudinal midline skin incision with a 10-blade  scalpel.  Dissection down through  subcutaneous tissues using the knife.  We identified the peripatellar tissues and performed a medial parapatellar arthrotomy and divided the lateral patellofemoral ligaments, everted the patella.  We entered the distal femur with a step-cut drill.  We  then placed our intramedullary resection guide and resected 10 mm of bone off the left distal femur.  It was set on 5 degrees left.  We then sized the femur to size 2.5 anterior down and placed our 4-in-1 cutting block and made our anterior, posterior  and chamfer cuts using the oscillating saw.  We removed ACL, PCL, and remaining meniscal tissue, subluxed the tibia anteriorly, performed a perpendicular tibial cut using external alignment jig perpendicular to the long axis of the tibia.  We just used  minimal posterior slope for this posterior cruciate substituting prosthesis.  We checked our flexion and extension gaps, which were symmetric at 10 mm.  We did remove excess posterior osteophytes off the posterior femur with a lamina spreader and  released a little more capsule.  We also removed osteophytes off the proximal medial tibia.  Our gaps were symmetric.  At this point, we went ahead and completed our tibial preparation with a blunt modular drill and keel punch, trying to externally  rotate the components as much as possible.  We then did our femoral cut for the box, so we did our box-cut guide, placed that on and adjusted saw-cutter box.  We did a size 3 on the tibia and a 2.5 left on the femur.  We impacted that into position,  reduced the knee with a 2.5+10 poly.  We then resurfaced the patella, going from a 24 mm thickness  down to a 16 mm thickness.  We used a cutting jig and then drilled our lug holes for the 35 patella.  Once we had that in place, we trialed the knee and  ranged it.  We had nice patellar tracking with no-touch technique.  We irrigated the bone thoroughly.  We used a drying technique as well for  the bone, and then cemented the components into place using DePuy high-viscosity cement, the tibia first, then  femur.  We placed the 10 poly back in place and placed the knee in extension to further compress the cement, and then we used a patellar clamp to hold the patella in place.  A 35 patella was cemented.  Once the cement was hardened, we removed excess  cement with 1/4-inch curved osteotome.  We then checked the knee in extension and flexion and felt like we could get a 12.5 in.  It was actually pretty stable, but slightly hyperextended with the 10 and definitely improved with the 12.5, so full  extension with a 12.5 and able to have nice flexion stability with no movement whatsoever with an anterior and posterior drawer.  We irrigated thoroughly.  I double checked for any excess cement, and then closed the parapatellar arthrotomy with #1 Vicryl  suture, followed by 2-0 Vicryl layered subcutaneous closure and 4-0 Monocryl for skin.  Steri-Strips were applied, followed by a sterile dressing.  The patient tolerated surgery well.  LN/NUANCE  D:12/27/2017 T:12/27/2017 JOB:[id number]

## 2017-12-27 NOTE — Progress Notes (Signed)
Orthopedic Tech Progress Note Patient Details:  Sheryl [name] 09/13/1950 [id number]  CPM Left Knee CPM Left Knee: On Left Knee Flexion (Degrees): 90 Left Knee Extension (Degrees): 0 Additional Comments: foot roll  Post Interventions Patient Tolerated: Well Instructions Provided: Care of device, Adjustment of device  [place] Pink 12/27/2017, 1:28 PM

## 2017-12-27 NOTE — Discharge Instructions (Signed)
Ice to the knee as much as you can.  Keep the incision clean and dry and covered for one week, then ok to get it wet in the shower.  You may place full weight on the left leg.  Use the knee brace on at night to maintain knee extension while sleeping.  DO NOT prop anything under the knee, it will make your knee stiff.  Do your active exercises every hour while awake.  Sit and dangle your knee over the side of the bed to get 90 degrees of bend.  Follow up in two weeks with Dr Veverly Fells in the office.  Call [phone]

## 2017-12-28 DIAGNOSIS — M1712 Unilateral primary osteoarthritis, left knee: Secondary | ICD-10-CM | POA: Diagnosis not present

## 2017-12-28 LAB — BASIC METABOLIC PANEL
Anion gap: 10 (ref 5–15)
BUN: 19 mg/dL (ref 6–20)
CALCIUM: 8.4 mg/dL — AB (ref 8.9–10.3)
CO2: 22 mmol/L (ref 22–32)
Chloride: 103 mmol/L (ref 101–111)
Creatinine, Ser: 1.09 mg/dL — ABNORMAL HIGH (ref 0.44–1.00)
GFR calc non Af Amer: 51 mL/min — ABNORMAL LOW (ref >60)
GFR, EST AFRICAN AMERICAN: 59 mL/min — AB (ref >60)
GLUCOSE: 109 mg/dL — AB (ref 65–99)
POTASSIUM: 3.6 mmol/L (ref 3.5–5.1)
Sodium: 135 mmol/L (ref 135–145)

## 2017-12-28 LAB — CBC
HEMATOCRIT: 30.2 % — AB (ref 36.0–46.0)
HEMOGLOBIN: 9.8 g/dL — AB (ref 12.0–15.0)
MCH: 30.4 pg (ref 26.0–34.0)
MCHC: 32.5 g/dL (ref 30.0–36.0)
MCV: 93.8 fL (ref 78.0–100.0)
Platelets: 274 10*3/uL (ref 150–400)
RBC: 3.22 MIL/uL — AB (ref 3.87–5.11)
RDW: 13.1 % (ref 11.5–15.5)
WBC: 7.6 10*3/uL (ref 4.0–10.5)

## 2017-12-28 NOTE — Progress Notes (Signed)
Subjective: 1 Day Post-Op Procedure(s) (LRB): LEFT TOTAL KNEE ARTHROPLASTY (Left) Patient reports pain as 3 on 0-10 scale.    Objective: Vital signs in last 24 hours: Temp:  [97.3 F (36.3 C)-98.6 F (37 C)] 98.6 F (37 C) (05/11 0331) Pulse Rate:  [61-95] 95 (05/11 0331) Resp:  [10-25] 17 (05/11 0331) BP: (81-118)/(48-68) 105/62 (05/11 0331) SpO2:  [90 %-100 %] 96 % (05/11 0331)  Intake/Output from previous day: 05/10 0701 - 05/11 0700 In: 1121.7 [I.V.:1121.7] Out: 1420 [Urine:1370; Blood:50] Intake/Output this shift: No intake/output data recorded.  Recent Labs    12/28/17 0405  HGB 9.8*   Recent Labs    12/28/17 0405  WBC 7.6  RBC 3.22*  HCT 30.2*  PLT 274   Recent Labs    12/28/17 0405  NA 135  K 3.6  CL 103  CO2 22  BUN 19  CREATININE 1.09*  GLUCOSE 109*  CALCIUM 8.4*   No results for input(s): LABPT, INR in the last 72 hours.  Neurologically intact Neurovascular intact Sensation intact distally Intact pulses distally Incision: dressing C/D/I Compartment soft  Anticipated LOS equal to or greater than 2 midnights due to - Age 68 and older with one or more of the following:  - Obesity  - Expected need for hospital services (PT, OT, Nursing) required for safe  discharge  - Anticipated need for postoperative skilled nursing care or inpatient rehab  - Active co-morbidities: None OR   - Unanticipated findings during/Post Surgery: None  - Patient is a high risk of re-admission due to: None   Assessment/Plan: 1 Day Post-Op Procedure(s) (LRB): LEFT TOTAL KNEE ARTHROPLASTY (Left) Advance diet Up with therapy Discharge home with home health    Justyce Yeater C 12/28/2017, 8:59 AM

## 2017-12-28 NOTE — Evaluation (Signed)
Physical Therapy Evaluation Patient Details Name: Sheryl [name] MRN: [id number] DOB: [birth year] Today's Date: 12/28/2017   History of Present Illness  Pt is a 67 y/o female s/p L TKA. PMH including but not limited to COPD, HLD.  Clinical Impression  Pt presented supine in bed with HOB elevated, awake and willing to participate in therapy session. Prior to admission, pt reported that she was independent with all functional mobility. Pt lives in a two level house (able to live on main level) with five steps to enter with her daughter. She has family/friends that can provide 24/7 supervision/assistance if needed. Pt currently requires min A for bed mobility, min guard for transfers and min guard to ambulate a very short distance within her room. PT will continue to follow acutely to progress mobility as tolerated. Pt would continue to benefit from skilled physical therapy services at this time while admitted and after d/c to address the below listed limitations in order to improve overall safety and independence with functional mobility.     Follow Up Recommendations Home health PT;Supervision/Assistance - 24 hour    Equipment Recommendations  None recommended by PT    Recommendations for Other Services       Precautions / Restrictions Precautions Precautions: Fall;Knee Precaution Comments: Reviewed positioning of LE following TKA Restrictions Weight Bearing Restrictions: Yes LLE Weight Bearing: Weight bearing as tolerated      Mobility  Bed Mobility Overal bed mobility: Needs Assistance Bed Mobility: Supine to Sit     Supine to sit: Min assist     General bed mobility comments: increased time and effort, assistance with L LE movement off of bed  Transfers Overall transfer level: Needs assistance Equipment used: Rolling walker (2 wheeled) Transfers: Sit to/from Stand Sit to Stand: Min guard         General transfer comment: increased time and effort, cueing for safe  hand placement, min guard for safety  Ambulation/Gait Ambulation/Gait assistance: Min guard Ambulation Distance (Feet): 5 Feet Assistive device: Rolling walker (2 wheeled) Gait Pattern/deviations: Step-to pattern;Decreased step length - right;Decreased step length - left;Decreased stride length;Decreased weight shift to left;Shuffle;Antalgic Gait velocity: decreased Gait velocity interpretation: <1.8 ft/sec, indicate of risk for recurrent falls General Gait Details: very limited secondary to pain and fatigue; pt able to tolerate partial WB'ing on L; close min guard for safety  Stairs            Wheelchair Mobility    Modified Rankin (Stroke Patients Only)       Balance Overall balance assessment: Needs assistance Sitting-balance support: Feet supported Sitting balance-Leahy Scale: Good     Standing balance support: During functional activity;Bilateral upper extremity supported Standing balance-Leahy Scale: Poor                               Pertinent Vitals/Pain Pain Assessment: Faces Faces Pain Scale: Hurts even more Pain Location: L knee Pain Descriptors / Indicators: Sore;Grimacing;Guarding Pain Intervention(s): Repositioned;Monitored during session    Home Living Family/patient expects to be discharged to:: Private residence Living Arrangements: Children Available Help at Discharge: Family;Available 24 hours/day Type of Home: House Home Access: Stairs to enter Entrance Stairs-Rails: Psychiatric nurse of Steps: 5 Home Layout: Able to live on main level with bedroom/bathroom Home Equipment: Shower seat;Walker - 2 wheels;Crutches;Bedside commode      Prior Function Level of Independence: Independent  Hand Dominance        Extremity/Trunk Assessment   Upper Extremity Assessment Upper Extremity Assessment: Overall WFL for tasks assessed    Lower Extremity Assessment Lower Extremity Assessment: LLE  deficits/detail LLE Deficits / Details: pt with decreased strength and ROM limitations secondary to post-op       Communication   Communication: No difficulties  Cognition Arousal/Alertness: Awake/alert Behavior During Therapy: WFL for tasks assessed/performed Overall Cognitive Status: Within Functional Limits for tasks assessed                                        General Comments      Exercises Total Joint Exercises Ankle Circles/Pumps: AROM;Both;10 reps;Seated Quad Sets: AROM;Strengthening;Left;10 reps;Seated Heel Slides: AAROM;Left;10 reps;Seated   Assessment/Plan    PT Assessment Patient needs continued PT services  PT Problem List Decreased strength;Decreased range of motion;Decreased activity tolerance;Decreased balance;Decreased mobility;Decreased coordination;Decreased knowledge of use of DME;Decreased safety awareness;Decreased knowledge of precautions;Pain       PT Treatment Interventions DME instruction;Gait training;Stair training;Functional mobility training;Therapeutic exercise;Therapeutic activities;Balance training;Neuromuscular re-education;Patient/family education    PT Goals (Current goals can be found in the Care Plan section)  Acute Rehab PT Goals Patient Stated Goal: decrease pain, return home PT Goal Formulation: With patient Time For Goal Achievement: 01/11/18 Potential to Achieve Goals: Good    Frequency 7X/week   Barriers to discharge        Co-evaluation               AM-PAC PT "6 Clicks" Daily Activity  Outcome Measure Difficulty turning over in bed (including adjusting bedclothes, sheets and blankets)?: None Difficulty moving from lying on back to sitting on the side of the bed? : Unable Difficulty sitting down on and standing up from a chair with arms (e.g., wheelchair, bedside commode, etc,.)?: Unable Help needed moving to and from a bed to chair (including a wheelchair)?: A Little Help needed walking in  hospital room?: A Little Help needed climbing 3-5 steps with a railing? : A Lot 6 Click Score: 14    End of Session Equipment Utilized During Treatment: Gait belt Activity Tolerance: Patient limited by fatigue;Patient limited by pain Patient left: in chair;with call bell/phone within reach Nurse Communication: Mobility status PT Visit Diagnosis: Other abnormalities of gait and mobility (R26.89);Pain Pain - Right/Left: Left Pain - part of body: Knee    Time: 8182-9937 PT Time Calculation (min) (ACUTE ONLY): 31 min   Charges:   PT Evaluation $PT Eval Moderate Complexity: 1 Mod PT Treatments $Therapeutic Activity: 8-22 mins   PT G Codes:        Mayo, PT, DPT [phone]   [place] 12/28/2017, 10:58 AM

## 2017-12-28 NOTE — Progress Notes (Signed)
Physical Therapy Treatment Patient Details Name: Sheryl [name] MRN: [id number] DOB: 02-Aug-1951 Today's Date: 12/28/2017    History of Present Illness Pt is a 67 y/o female s/p L TKA. PMH including but not limited to COPD, HLD.    PT Comments    Pt making steady progress, tolerated increased ambulation distance this session. Pt would continue to benefit from skilled physical therapy services at this time while admitted and after d/c to address the below listed limitations in order to improve overall safety and independence with functional mobility.   Follow Up Recommendations  Home health PT;Supervision/Assistance - 24 hour     Equipment Recommendations  None recommended by PT    Recommendations for Other Services       Precautions / Restrictions Precautions Precautions: Fall;Knee Precaution Comments: Reviewed positioning of LE following TKA Restrictions Weight Bearing Restrictions: Yes LLE Weight Bearing: Weight bearing as tolerated    Mobility  Bed Mobility               General bed mobility comments: pt OOB in recliner chair upon arrival  Transfers Overall transfer level: Needs assistance Equipment used: Rolling walker (2 wheeled) Transfers: Sit to/from Stand Sit to Stand: Min guard         General transfer comment: increased time and effort, cueing for safe hand placement, min guard for safety  Ambulation/Gait Ambulation/Gait assistance: Min guard Ambulation Distance (Feet): 30 Feet Assistive device: Rolling walker (2 wheeled) Gait Pattern/deviations: Step-to pattern;Decreased step length - right;Decreased step length - left;Decreased stride length;Decreased weight shift to left;Shuffle;Antalgic Gait velocity: decreased Gait velocity interpretation: <1.8 ft/sec, indicate of risk for recurrent falls General Gait Details: pt with improved tolerance this session; cueing for sequencing with RW; min guard for safety   Stairs             Wheelchair  Mobility    Modified Rankin (Stroke Patients Only)       Balance Overall balance assessment: Needs assistance Sitting-balance support: Feet supported Sitting balance-Leahy Scale: Good     Standing balance support: During functional activity;Bilateral upper extremity supported Standing balance-Leahy Scale: Poor                              Cognition Arousal/Alertness: Awake/alert Behavior During Therapy: WFL for tasks assessed/performed Overall Cognitive Status: Within Functional Limits for tasks assessed                                        Exercises Total Joint Exercises Ankle Circles/Pumps: AROM;Both;10 reps;Seated Long Arc Quad: AAROM;Left;10 reps;Seated Knee Flexion: AAROM;Left;10 reps;Seated Goniometric ROM: Flexion = 80 degrees; Extension = lacking 10 degrees to neutral; measured in sitting    General Comments        Pertinent Vitals/Pain Pain Assessment: Faces Faces Pain Scale: Hurts even more Pain Location: L knee Pain Descriptors / Indicators: Sore;Grimacing;Guarding Pain Intervention(s): Monitored during session;Repositioned    Home Living                      Prior Function            PT Goals (current goals can now be found in the care plan section) Acute Rehab PT Goals Patient Stated Goal: decrease pain, return home PT Goal Formulation: With patient Time For Goal Achievement: 01/11/18 Potential to Achieve Goals: Good Progress towards PT  goals: Progressing toward goals    Frequency    7X/week      PT Plan Current plan remains appropriate    Co-evaluation              AM-PAC PT "6 Clicks" Daily Activity  Outcome Measure  Difficulty turning over in bed (including adjusting bedclothes, sheets and blankets)?: None Difficulty moving from lying on back to sitting on the side of the bed? : Unable Difficulty sitting down on and standing up from a chair with arms (e.g., wheelchair, bedside  commode, etc,.)?: Unable Help needed moving to and from a bed to chair (including a wheelchair)?: A Little Help needed walking in hospital room?: A Little Help needed climbing 3-5 steps with a railing? : A Lot 6 Click Score: 14    End of Session Equipment Utilized During Treatment: Gait belt Activity Tolerance: Patient limited by fatigue;Patient limited by pain Patient left: in chair;with call bell/phone within reach Nurse Communication: Mobility status PT Visit Diagnosis: Other abnormalities of gait and mobility (R26.89);Pain Pain - Right/Left: Left Pain - part of body: Knee     Time: 0093-8182 PT Time Calculation (min) (ACUTE ONLY): 19 min  Charges:  $Gait Training: 8-22 mins                    G Codes:       [place], [place], [place] 12/28/2017, 3:22 PM

## 2017-12-28 NOTE — Plan of Care (Signed)
  Problem: Education: Goal: Knowledge of General Education information will improve Outcome: Progressing   Problem: Health Behavior/Discharge Planning: Goal: Ability to manage health-related needs will improve Outcome: Progressing   Problem: Clinical Measurements: Goal: Will remain free from infection Outcome: Progressing   Problem: Activity: Goal: Risk for activity intolerance will decrease Outcome: Progressing   Problem: Elimination: Goal: Will not experience complications related to bowel motility Outcome: Progressing   Problem: Pain Managment: Goal: General experience of comfort will improve Outcome: Progressing

## 2017-12-28 NOTE — Progress Notes (Signed)
Orthopedic Tech Progress Note Patient Details:  Sheryl [name] 12/18/1950 [id number]  Patient ID: Sheryl [name], female   DOB: 1951/05/20, 67 y.o.   MRN: [id number] Pt will call when ready for cpm.  [name] 12/28/2017, 6:11 AM

## 2017-12-29 DIAGNOSIS — M1712 Unilateral primary osteoarthritis, left knee: Secondary | ICD-10-CM | POA: Diagnosis not present

## 2017-12-29 LAB — CBC
HEMATOCRIT: 29.6 % — AB (ref 36.0–46.0)
Hemoglobin: 10 g/dL — ABNORMAL LOW (ref 12.0–15.0)
MCH: 31.3 pg (ref 26.0–34.0)
MCHC: 33.8 g/dL (ref 30.0–36.0)
MCV: 92.8 fL (ref 78.0–100.0)
Platelets: 281 10*3/uL (ref 150–400)
RBC: 3.19 MIL/uL — AB (ref 3.87–5.11)
RDW: 12.9 % (ref 11.5–15.5)
WBC: 9.1 10*3/uL (ref 4.0–10.5)

## 2017-12-29 NOTE — Progress Notes (Signed)
Provided discharge education/instructions, all questions and concerns addressed, Pt to discharge home with belongings accompanied by her daughter.

## 2017-12-29 NOTE — Care Management (Addendum)
Patient states she has all necessary DME at home.  [facility] notified of patient's d/c.    Update 1525: pt's walker does not meet her needs and cannot be adjusted.  Regular RW ordered and requested from [facility].

## 2017-12-29 NOTE — Progress Notes (Signed)
Physical Therapy Treatment Patient Details Name: Sheryl [name] MRN: [id number] DOB: [birth year] Today's Date: 12/29/2017    History of Present Illness Pt is a 67 y/o female s/p L TKA. PMH including but not limited to COPD, HLD.    PT Comments    Patient is making progress toward mobility goals. Pt tolerated gait and stair training well. Pt will need 24 hour assist/supervision upon d/c and pt reported she is trying to work that out with family today. Current plan remains appropriate.    Follow Up Recommendations  Home health PT;Supervision/Assistance - 24 hour     Equipment Recommendations  None recommended by PT    Recommendations for Other Services       Precautions / Restrictions Precautions Precautions: Fall;Knee Precaution Comments: Reviewed positioning of LE following TKA Restrictions Weight Bearing Restrictions: Yes LLE Weight Bearing: Weight bearing as tolerated    Mobility  Bed Mobility               General bed mobility comments: pt OOB in recliner chair upon arrival  Transfers Overall transfer level: Needs assistance Equipment used: Rolling walker (2 wheeled) Transfers: Sit to/from Stand Sit to Stand: Min guard         General transfer comment: cues for safe hand placement; from recliner and commode  Ambulation/Gait Ambulation/Gait assistance: Supervision;Min guard Ambulation Distance (Feet): 100 Feet Assistive device: Rolling walker (2 wheeled) Gait Pattern/deviations: Step-to pattern;Decreased step length - right;Decreased weight shift to left;Antalgic;Decreased stance time - left Gait velocity: decreased   General Gait Details: cues for sequencing and step length symmetry; pt with improving step through pattern with increased distance   Stairs Stairs: Yes Stairs assistance: Min guard;Min assist Stair Management: One rail Left;Step to pattern;Sideways;No rails;Backwards;With walker Number of Stairs: (4 with rail and 3 with RW) General  stair comments: cues for sequencing and technique; 4 step with rail going sideways and 3 step with RW going backwards   Wheelchair Mobility    Modified Rankin (Stroke Patients Only)       Balance Overall balance assessment: Needs assistance Sitting-balance support: Feet supported Sitting balance-Leahy Scale: Good     Standing balance support: During functional activity;Bilateral upper extremity supported Standing balance-Leahy Scale: Poor                              Cognition Arousal/Alertness: Awake/alert Behavior During Therapy: WFL for tasks assessed/performed Overall Cognitive Status: Within Functional Limits for tasks assessed                                        Exercises Total Joint Exercises Knee Flexion: AROM;AAROM;Left;5 reps(10 sec hold)    General Comments        Pertinent Vitals/Pain Pain Assessment: Faces Faces Pain Scale: Hurts even more Pain Location: L knee Pain Descriptors / Indicators: Sore;Guarding;Grimacing Pain Intervention(s): Limited activity within patient's tolerance;Monitored during session;Premedicated before session;Repositioned;Ice applied    Home Living                      Prior Function            PT Goals (current goals can now be found in the care plan section) Acute Rehab PT Goals Patient Stated Goal: decrease pain, return home PT Goal Formulation: With patient Time For Goal Achievement: 01/11/18 Potential to Achieve Goals: Good Progress  towards PT goals: Progressing toward goals    Frequency    7X/week      PT Plan Current plan remains appropriate    Co-evaluation              AM-PAC PT "6 Clicks" Daily Activity  Outcome Measure  Difficulty turning over in bed (including adjusting bedclothes, sheets and blankets)?: None Difficulty moving from lying on back to sitting on the side of the bed? : Unable Difficulty sitting down on and standing up from a chair with  arms (e.g., wheelchair, bedside commode, etc,.)?: Unable Help needed moving to and from a bed to chair (including a wheelchair)?: A Little Help needed walking in hospital room?: A Little Help needed climbing 3-5 steps with a railing? : A Little 6 Click Score: 15    End of Session Equipment Utilized During Treatment: Gait belt Activity Tolerance: Patient tolerated treatment well Patient left: in chair;with call bell/phone within reach Nurse Communication: Mobility status PT Visit Diagnosis: Other abnormalities of gait and mobility (R26.89);Pain Pain - Right/Left: Left Pain - part of body: Knee     Time: 1275-1700 PT Time Calculation (min) (ACUTE ONLY): 41 min  Charges:  $Gait Training: 23-37 mins $Therapeutic Activity: 8-22 mins                    G Codes:       Earney Navy, PTA Pager: [phone]     Darliss Cheney 12/29/2017, 10:42 AM

## 2017-12-29 NOTE — Plan of Care (Signed)
  Problem: Activity: Goal: Risk for activity intolerance will decrease Outcome: Progressing   Problem: Elimination: Goal: Will not experience complications related to bowel motility Outcome: Progressing   Problem: Safety: Goal: Ability to remain free from injury will improve Outcome: Progressing   

## 2017-12-29 NOTE — Discharge Summary (Signed)
Orthopedic Discharge Summary        Physician Discharge Summary  Patient ID: Earnesteen Birnie MRN: [id number] DOB/AGE: August 11, 1951 67 y.o.  Admit date: 12/27/2017 Discharge date: 12/29/2017   Procedures:  Procedure(s) (LRB): LEFT TOTAL KNEE ARTHROPLASTY (Left)  Attending Physician:  Dr. Esmond Plants  Admission Diagnoses:   Left knee OA, primary, end stage  Discharge Diagnoses:  Left knee OA, primary, end stage   Past Medical History:  Diagnosis Date  . Allergy   . Arthritis   . COPD (chronic obstructive pulmonary disease) ([place]) 12/09/2014  . Depression   . Dyspnea    on exertion  . Hyperlipidemia   . Hypertension   . LGSIL (low grade squamous intraepithelial dysplasia)   . Osteoporosis   . Pre-diabetes     PCP: Binnie Rail, MD   Discharged Condition: good  Hospital Course:  Patient underwent the above stated procedure on 12/27/2017. Patient tolerated the procedure well and brought to the recovery room in good condition and subsequently to the floor. Patient had an uncomplicated hospital course and was stable for discharge. Home health therapy arranged   Disposition: Discharge disposition: 01-Home or Self Care      with follow up in 2 weeks   Follow-up Information    Call Netta Cedars, MD.   Specialty:  Orthopedic Surgery Why:  [phone] Contact information: [address] [place] [address] [place] [address] [phone]           Discharge Instructions    Call MD / Call 911   Complete by:  As directed    If you experience chest pain or shortness of breath, CALL 911 and be transported to the hospital emergency room.  If you develope a fever above 101 F, pus (white drainage) or increased drainage or redness at the wound, or calf pain, call your surgeon's office.   Constipation Prevention   Complete by:  As directed    Drink plenty of fluids.  Prune juice may be helpful.  You may use a stool softener, such as Colace (over the counter) 100 mg  twice a day.  Use MiraLax (over the counter) for constipation as needed.   Diet - low sodium heart healthy   Complete by:  As directed    Driving restrictions   Complete by:  As directed    No driving for 3 weeks   Increase activity slowly as tolerated   Complete by:  As directed       Allergies as of 12/29/2017      Reactions   Macrobid [nitrofurantoin Monohyd Macro] Nausea And Vomiting   GI upset      Medication List    TAKE these medications   albuterol 108 (90 Base) MCG/ACT inhaler Commonly known as:  PROVENTIL HFA;VENTOLIN HFA Inhale 1-2 puffs into the lungs every 6 (six) hours as needed for wheezing or shortness of breath.   aspirin 81 MG chewable tablet Commonly known as:  ASPIRIN CHILDRENS Chew 1 tablet (81 mg total) by mouth 2 (two) times daily.   atomoxetine 100 MG capsule Commonly known as:  STRATTERA TAKE 1 CAPSULE BY MOUTH EVERY DAY   Estradiol 10 MCG Tabs vaginal tablet Use 2x/ weekly for vaginal atrophy   FLUoxetine 20 MG capsule Commonly known as:  PROZAC TAKE 1 CAPSULE (20 MG TOTAL) BY MOUTH DAILY. IN ADDITION TO 40 MG FOR TOTAL OF 60 MG DAILY   FLUoxetine 40 MG capsule Commonly known as:  PROZAC TAKE 1 CAPSULE (40 MG  TOTAL) BY MOUTH DAILY. IN ADDITION TO 20 MG FOR TOTAL OF 60 MG DAILY   fluticasone 50 MCG/ACT nasal spray Commonly known as:  FLONASE Place 2 sprays into both nostrils daily. What changed:    when to take this  reasons to take this   HYDROcodone-acetaminophen 5-325 MG tablet Commonly known as:  NORCO Take 1-2 tablets by mouth every 6 (six) hours as needed for moderate pain.   ipratropium 0.03 % nasal spray Commonly known as:  ATROVENT Place 2 sprays into both nostrils 2 (two) times daily.   losartan-hydrochlorothiazide 100-12.5 MG tablet Commonly known as:  HYZAAR TAKE 1 TABLET BY MOUTH EVERY DAY   methocarbamol 500 MG tablet Commonly known as:  ROBAXIN Take 1 tablet (500 mg total) by mouth every 6 (six) hours as  needed.   multivitamin capsule Take 1 capsule by mouth as needed.   rosuvastatin 10 MG tablet Commonly known as:  CRESTOR TAKE 1 TABLET BY MOUTH EVERY DAY   traZODone 50 MG tablet Commonly known as:  DESYREL TAKE 1 TABLET AT BEDTIME   TURMERIC PO Take 1 capsule by mouth daily.   umeclidinium-vilanterol 62.5-25 MCG/INH Aepb Commonly known as:  ANORO ELLIPTA Inhale 1 puff into the lungs daily.         Signed: Augustin Schooling 12/29/2017, 8:54 AM  [facility] is now [facility] [address], [place], [place], [place] [address] Phone: [place]

## 2017-12-29 NOTE — Progress Notes (Signed)
Physical Therapy Treatment Patient Details Name: Sheryl [name] MRN: [id number] DOB: 06-May-1951 Today's Date: 12/29/2017    History of Present Illness Pt is a 67 y/o female s/p L TKA. PMH including but not limited to COPD, HLD.    PT Comments    Patient is making good progress with PT.  From a mobility standpoint anticipate patient will be ready for DC home when medically ready.    Follow Up Recommendations  Home health PT;Supervision/Assistance - 24 hour     Equipment Recommendations  Rolling walker with 5" wheels    Recommendations for Other Services       Precautions / Restrictions Precautions Precautions: Fall;Knee Precaution Comments: Reviewed positioning of LE following TKA Restrictions Weight Bearing Restrictions: Yes LLE Weight Bearing: Weight bearing as tolerated    Mobility  Bed Mobility               General bed mobility comments: pt OOB in recliner chair upon arrival  Transfers Overall transfer level: Needs assistance Equipment used: Rolling walker (2 wheeled) Transfers: Sit to/from Stand Sit to Stand: Supervision         General transfer comment: safe hand placement demonstrated  Ambulation/Gait Ambulation/Gait assistance: Supervision Ambulation Distance (Feet): 100 Feet Assistive device: Rolling walker (2 wheeled) Gait Pattern/deviations: Step-to pattern;Decreased step length - right;Decreased weight shift to left;Antalgic;Decreased stance time - left;Step-through pattern Gait velocity: decreased   General Gait Details: pt progressing to step through pattern; cues for increased R knee flexion during swing phase and proximity to Sheryl [name]    Modified Rankin (Stroke Patients Only)       Balance Overall balance assessment: Needs assistance Sitting-balance support: Feet supported Sitting balance-Leahy Scale: Good     Standing balance support: During functional activity;Bilateral upper  extremity supported Standing balance-Leahy Scale: Poor                              Cognition Arousal/Alertness: Awake/alert Behavior During Therapy: WFL for tasks assessed/performed Overall Cognitive Status: Within Functional Limits for tasks assessed                                        Exercises Total Joint Exercises Quad Sets: AROM;Both;10 reps Short Arc Quad: AROM;Left;10 reps Heel Slides: AROM;Left;10 reps Hip ABduction/ADduction: AROM;Left;10 reps Long Arc Quad: AROM;Left;10 reps Knee Flexion: AROM;Left;5 reps(10 sec hold) Goniometric ROM: approx 85 degrees flexion in sitting    General Comments        Pertinent Vitals/Pain Pain Assessment: Faces Faces Pain Scale: Hurts little more Pain Location: L knee Pain Descriptors / Indicators: Sore Pain Intervention(s): Limited activity within patient's tolerance;Monitored during session;Premedicated before session;Repositioned    Home Living                      Prior Function            PT Goals (current goals can now be found in the care plan section) Acute Rehab PT Goals PT Goal Formulation: With patient Time For Goal Achievement: 01/11/18 Potential to Achieve Goals: Good Progress towards PT goals: Progressing toward goals    Frequency    7X/week      PT Plan Current plan remains appropriate    Co-evaluation  AM-PAC PT "6 Clicks" Daily Activity  Outcome Measure  Difficulty turning over in bed (including adjusting bedclothes, sheets and blankets)?: None Difficulty moving from lying on back to sitting on the side of the bed? : Unable Difficulty sitting down on and standing up from a chair with arms (e.g., wheelchair, bedside commode, etc,.)?: Unable Help needed moving to and from a bed to chair (including a wheelchair)?: A Little Help needed walking in hospital room?: A Little Help needed climbing 3-5 steps with a railing? : A Little 6 Click  Score: 15    End of Session Equipment Utilized During Treatment: Gait belt Activity Tolerance: Patient tolerated treatment well Patient left: in chair;with call bell/phone within reach Nurse Communication: Mobility status PT Visit Diagnosis: Other abnormalities of gait and mobility (R26.89);Pain Pain - Right/Left: Left Pain - part of body: Knee     Time: 0272-5366 PT Time Calculation (min) (ACUTE ONLY): 28 min  Charges:  $Gait Training: 8-22 mins $Therapeutic Exercise: 8-22 mins                    G Codes:       Earney Navy, PTA Pager: [phone]     Darliss Cheney 12/29/2017, 4:15 PM

## 2017-12-29 NOTE — Progress Notes (Signed)
Orthopedics Progress Note  Subjective: Patient feeling better and had a BM but still not confident with mobilization on her own.  Objective:  Vitals:   12/29/17 0442 12/29/17 0829  BP: (!) 113/56   Pulse: 91   Resp: 13   Temp:    SpO2: 90% 92%    General: Awake and alert  Musculoskeletal: left knee incision clean and dry and intact, minimal swelling and bruising. Good extension and quad contraction. Calf supple, Neg Homan's Neurovascularly intact  Lab Results  Component Value Date   WBC 9.1 12/29/2017   HGB 10.0 (L) 12/29/2017   HCT 29.6 (L) 12/29/2017   MCV 92.8 12/29/2017   PLT 281 12/29/2017       Component Value Date/Time   NA 135 12/28/2017 0405   NA 138 02/18/2017 1157   K 3.6 12/28/2017 0405   CL 103 12/28/2017 0405   CO2 22 12/28/2017 0405   GLUCOSE 109 (H) 12/28/2017 0405   BUN 19 12/28/2017 0405   BUN 14 02/18/2017 1157   CREATININE 1.09 (H) 12/28/2017 0405   CREATININE 1.16 (H) 03/22/2016 1610   CALCIUM 8.4 (L) 12/28/2017 0405   GFRNONAA 51 (L) 12/28/2017 0405   GFRAA 59 (L) 12/28/2017 0405    Lab Results  Component Value Date   INR 2.03 (H) 02/27/2011   INR 2.70 (H) 02/26/2011   INR 2.02 (H) 02/25/2011    Assessment/Plan: POD #2 s/p Procedure(s): LEFT TOTAL KNEE ARTHROPLASTY Discharge to home if cleared by PT, OT Home health PT, OT, and nonskilled aide. No CPM for home please, thanks!  Doran Heater. Veverly Fells, MD 12/29/2017 8:50 AM

## 2017-12-30 ENCOUNTER — Encounter (HOSPITAL_COMMUNITY): Payer: Self-pay | Admitting: Orthopedic Surgery

## 2017-12-30 ENCOUNTER — Telehealth: Payer: Self-pay | Admitting: *Deleted

## 2017-12-30 NOTE — Telephone Encounter (Signed)
Pt was on TCM report admitted 12/27/17 for Left knee OA, primary, end stage. Pt had a LEFT TOTAL KNEE ARTHROPLASTY. Pt D/C 12/29/17, and will f/u w/specialist in 2 weeks.Marland KitchenJohny Chess

## 2018-01-13 ENCOUNTER — Other Ambulatory Visit: Payer: Self-pay | Admitting: Internal Medicine

## 2018-01-14 ENCOUNTER — Encounter: Payer: Self-pay | Admitting: Family Medicine

## 2018-02-05 ENCOUNTER — Other Ambulatory Visit: Payer: Self-pay | Admitting: Internal Medicine

## 2018-02-05 DIAGNOSIS — Z760 Encounter for issue of repeat prescription: Secondary | ICD-10-CM

## 2018-02-05 NOTE — Telephone Encounter (Signed)
Last filled 10/28/17

## 2018-04-06 ENCOUNTER — Other Ambulatory Visit: Payer: Self-pay | Admitting: Internal Medicine

## 2018-04-10 ENCOUNTER — Other Ambulatory Visit: Payer: Self-pay | Admitting: Internal Medicine

## 2018-04-10 DIAGNOSIS — E7800 Pure hypercholesterolemia, unspecified: Secondary | ICD-10-CM

## 2018-05-05 NOTE — Progress Notes (Signed)
Subjective:    Patient ID: Sheryl [name], female    DOB: 1950-10-27, 67 y.o.   MRN: [id number]  HPI The patient is here for an acute visit.   COPD/ wants to quit smoking: She has COPD and follows with pulmonary.  She is using her inhalers as prescribed, but has shortness of breath with exertion.  She does have a cough, but denies any wheeze.  Her symptoms are chronic in nature.  She quit smoking for a few months, but restarted when she went to the beach over the summer with a friend who smokes.  She has used Chantix in the past and it always works.  She has had weird dreams, but she can tolerate them.   Her breathing is getting worse and she really wants to quit again.   Medications and allergies reviewed with patient and updated if appropriate.  Patient Active Problem List   Diagnosis Date Noted  . 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 Pre-op examination 10/31/2017  . CKD (chronic kidney disease) 12/27/2016  . Insomnia 06/29/2016  . Prediabetes 06/29/2016  . 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 COPD (chronic obstructive pulmonary disease) ([place]) 12/09/2014  . Lumbago 07/16/2014  . Bilateral hand pain 07/16/2014  . Nicotine addiction 06/06/2012  . HTN (hypertension) 06/04/2012  . Hyperlipidemia 06/04/2012  . Depression 06/04/2012  . ADD (attention deficit disorder) 06/04/2012  . AR (allergic rhinitis) 06/04/2012    Current Outpatient Medications on File Prior to Visit  Medication Sig Dispense Refill  . albuterol (PROVENTIL HFA;VENTOLIN HFA) 108 (90 Base) MCG/ACT inhaler Inhale 1-2 puffs into the lungs every 6 (six) hours as needed for wheezing or shortness of breath. 1 Inhaler 3  . aspirin (ASPIRIN CHILDRENS) 81 MG chewable tablet Chew 1 tablet (81 mg total) by mouth 2 (two) times daily. 60 tablet 0  . Estradiol 10 MCG TABS vaginal tablet Use 2x/ weekly for vaginal atrophy 8 tablet 11  . FLUoxetine (PROZAC) 20 MG capsule TAKE 1  CAPSULE (20 MG TOTAL) BY MOUTH DAILY. IN ADDITION TO 40 MG FOR TOTAL OF 60 MG DAILY 90 capsule 0  . FLUoxetine (PROZAC) 40 MG capsule TAKE 1 CAPSULE (40 MG TOTAL) BY MOUTH DAILY. IN ADDITION TO 20 MG FOR TOTAL OF 60 MG DAILY 90 capsule 1  . fluticasone (FLONASE) 50 MCG/ACT nasal spray Place 2 sprays into both nostrils daily. (Patient taking differently: Place 2 sprays into both nostrils as needed for allergies. ) 16 g 6  . ipratropium (ATROVENT) 0.03 % nasal spray Place 2 sprays into both nostrils 2 (two) times daily. 30 mL 0  . losartan-hydrochlorothiazide (HYZAAR) 100-12.5 MG tablet TAKE 1 TABLET BY MOUTH EVERY DAY 90 tablet 1  . Multiple Vitamin (MULTIVITAMIN) capsule Take 1 capsule by mouth as needed.     . rosuvastatin (CRESTOR) 10 MG tablet TAKE 1 TABLET BY MOUTH EVERY DAY 90 tablet 0  . STRATTERA 100 MG capsule TAKE 1 CAPSULE BY MOUTH EVERY DAY 90 capsule 0  . traMADol (ULTRAM) 50 MG tablet Take by mouth every 6 (six) hours as needed.    . traZODone (DESYREL) 50 MG tablet TAKE 1 TABLET AT BEDTIME 90 tablet 3  . TURMERIC PO Take 1 capsule by mouth daily.    Marland Kitchen umeclidinium-vilanterol (ANORO ELLIPTA) 62.5-25 MCG/INH AEPB Inhale 1 puff into the lungs daily. 28 each 5   No current facility-administered medications on file prior to visit.     Past Medical History:  Diagnosis Date  . Allergy   . Arthritis   . COPD (chronic obstructive pulmonary disease) ([place]) 12/09/2014  . Depression   . Dyspnea    on exertion  . Hyperlipidemia   . Hypertension   . LGSIL (low grade squamous intraepithelial dysplasia)   . Osteoporosis   . Pre-diabetes     Past Surgical History:  Procedure Laterality Date  . JOINT REPLACEMENT     right knee  . MULTIPLE TOOTH EXTRACTIONS    . REPLACEMENT TOTAL KNEE  2013   right  . TOTAL KNEE ARTHROPLASTY Left 12/27/2017  . TOTAL KNEE ARTHROPLASTY Left 12/27/2017   Procedure: LEFT TOTAL KNEE ARTHROPLASTY;  Surgeon: Netta Cedars, MD;  Location: [facility];  Service:  Orthopedics;  Laterality: Left;    Social History   Socioeconomic History  . Marital status: Divorced    Spouse name: Not on file  . Number of children: Not on file  . Years of education: Not on file  . Highest education level: Not on file  Occupational History  . Not on file  Social Needs  . Financial resource strain: Not on file  . Food insecurity:    Worry: Not on file    Inability: Not on file  . Transportation needs:    Medical: Not on file    Non-medical: Not on file  Tobacco Use  . Smoking status: Light Tobacco Smoker    Packs/day: 0.25    Years: 49.00    Pack years: 12.25    Types: Cigarettes  . Smokeless tobacco: Never Used  . Tobacco comment: Trying quit smoking!  Substance and Sexual Activity  . Alcohol use: No    Alcohol/week: 0.0 standard drinks  . Drug use: No  . Sexual activity: Never  Lifestyle  . Physical activity:    Days per week: Not on file    Minutes per session: Not on file  . Stress: Not on file  Relationships  . Social connections:    Talks on phone: Not on file    Gets together: Not on file    Attends religious service: Not on file    Active member of club or organization: Not on file    Attends meetings of clubs or organizations: Not on file    Relationship status: Not on file  Other Topics Concern  . Not on file  Social History Narrative  . Not on file    Family History  Problem Relation Age of Onset  . Hyperlipidemia Mother   . Alcohol abuse Father   . Arthritis Father   . Colon polyps Father   . Alcohol abuse Maternal Uncle   . Stroke Maternal Grandmother     Review of Systems  Constitutional: Negative for fever.  Respiratory: Positive for cough (productive) and shortness of breath. Negative for wheezing.   Cardiovascular: Negative for chest pain and palpitations.       Objective:   Vitals:   05/06/18 1352  BP: 138/80  Pulse: 100  Resp: 16  Temp: 98.4 F (36.9 C)  SpO2: 97%   BP Readings from Last 3  Encounters:  05/06/18 138/80  12/29/17 112/68  12/17/17 112/70   Wt Readings from Last 3 Encounters:  05/06/18 139 lb (63 kg)  12/17/17 144 lb (65.3 kg)  12/16/17 142 lb 6.4 oz (64.6 kg)   Body mass index is 26.26 kg/m.   Physical Exam    Constitutional: Appears well-developed and well-nourished. No distress.  HENT:  Head: Normocephalic and atraumatic.  Cardiovascular: Normal rate, regular rhythm and normal heart sounds.   No murmur heard. No carotid bruit .  No edema Pulmonary/Chest: Effort normal and breath sounds normal. No respiratory distress. No has no wheezes. No rales.  Skin: Skin is warm and dry. Not diaphoretic.  Psychiatric: Normal mood and affect. Behavior is normal.       Assessment & Plan:    See Problem List for Assessment and Plan of chronic medical problems.

## 2018-05-06 ENCOUNTER — Encounter: Payer: Self-pay | Admitting: Internal Medicine

## 2018-05-06 ENCOUNTER — Ambulatory Visit: Payer: Medicare Other | Admitting: Internal Medicine

## 2018-05-06 VITALS — BP 138/80 | HR 100 | Temp 98.4°F | Resp 16 | Ht 61.0 in | Wt 139.0 lb

## 2018-05-06 DIAGNOSIS — Z23 Encounter for immunization: Secondary | ICD-10-CM

## 2018-05-06 DIAGNOSIS — F17210 Nicotine dependence, cigarettes, uncomplicated: Secondary | ICD-10-CM

## 2018-05-06 DIAGNOSIS — J449 Chronic obstructive pulmonary disease, unspecified: Secondary | ICD-10-CM | POA: Diagnosis not present

## 2018-05-06 DIAGNOSIS — E7800 Pure hypercholesterolemia, unspecified: Secondary | ICD-10-CM

## 2018-05-06 MED ORDER — VARENICLINE TARTRATE 0.5 MG X 11 & 1 MG X 42 PO MISC: ORAL | 0 refills | Status: DC

## 2018-05-06 MED ORDER — ROSUVASTATIN CALCIUM 10 MG PO TABS: 10.0000 mg | ORAL_TABLET | Freq: Every day | ORAL | 1 refills | Status: DC

## 2018-05-06 MED ORDER — VARENICLINE TARTRATE 1 MG PO TABS: 1.0000 mg | ORAL_TABLET | Freq: Two times a day (BID) | ORAL | 1 refills | Status: DC

## 2018-05-06 NOTE — Patient Instructions (Addendum)
  Flu immunization administered today.    Medications reviewed and updated.  Changes include starting Chantix.  Your prescription(s) have been submitted to your pharmacy. Please take as directed and contact our office if you believe you are having problem(s) with the medication(s).    [facility]

## 2018-05-06 NOTE — Assessment & Plan Note (Signed)
Following with pulmonary She feels her shortness of breath has worsened.  Also has a chronic cough, but denies any wheezing or fevers Taking Anoro once a day He uses albuterol as needed Quit smoking, but restarted.  Will start Chantix, which has been effective for her in the past

## 2018-05-06 NOTE — Assessment & Plan Note (Addendum)
She wants to quit smoking.  Her shortness of breath is worsening and she realizes this is due to the smoking.  She also understands the other consequences of continuing to smoke such as increased risk of heart attacks, stroke and dementia. We discussed different ways of helping in her smoking cessation and she wants to take Chantix.  She has taken this in the past besides weird dreams she denies any side effects.  She can tolerate the weird dreams. She has recently successfully quit for several months, but did restart.  She understands she needs to change her habits and avoid people that smoke Chantix starter pack and continuation pack sent to the pharmacy

## 2018-06-05 ENCOUNTER — Encounter: Payer: Self-pay | Admitting: Internal Medicine

## 2018-06-05 ENCOUNTER — Ambulatory Visit: Payer: Medicare Other | Admitting: Internal Medicine

## 2018-06-05 ENCOUNTER — Ambulatory Visit: Payer: Self-pay | Admitting: *Deleted

## 2018-06-05 DIAGNOSIS — J019 Acute sinusitis, unspecified: Secondary | ICD-10-CM | POA: Insufficient documentation

## 2018-06-05 DIAGNOSIS — J0100 Acute maxillary sinusitis, unspecified: Secondary | ICD-10-CM

## 2018-06-05 DIAGNOSIS — R42 Dizziness and giddiness: Secondary | ICD-10-CM | POA: Diagnosis not present

## 2018-06-05 MED ORDER — AMOXICILLIN 500 MG PO CAPS: 500.0000 mg | ORAL_CAPSULE | Freq: Three times a day (TID) | ORAL | 0 refills | Status: DC

## 2018-06-05 MED ORDER — MECLIZINE HCL 25 MG PO TABS: 25.0000 mg | ORAL_TABLET | Freq: Three times a day (TID) | ORAL | 0 refills | Status: DC | PRN

## 2018-06-05 NOTE — Telephone Encounter (Signed)
Pt called with complaints of dizziness that has been intermittent for the past month; she says it has been going on for a month, and her ears have been hurting for about 3 days; the pt says that she used some ear drops that she got off the Internet, and it was painful when she put them in her ears; recommendations made per nurse triage protocol; pt offered and accepted appointment with Dr Billey Gosling, [facility], 06/05/18 at 1115; will route to office for notification of this upcoming appointment.   Reason for Disposition . [1] MODERATE dizziness (e.g., interferes with normal activities) AND [2] has NOT been evaluated by physician for this  (Exception: dizziness caused by heat exposure, sudden standing, or poor fluid intake)  Answer Assessment - Initial Assessment Questions 1. DESCRIPTION: "Describe your dizziness."     Feels like the whole room is spinning 2. LIGHTHEADED: "Do you feel lightheaded?" (e.g., somewhat faint, woozy, weak upon standing)     no 3. VERTIGO: "Do you feel like either you or the room is spinning or tilting?" (i.e. vertigo)     Room spinning 4. SEVERITY: "How bad is it?"  "Do you feel like you are going to faint?" "Can you stand and walk?"   - MILD - walking normally   - MODERATE - interferes with normal activities (e.g., work, school)    - SEVERE - unable to stand, requires support to walk, feels like passing out now.      Mild to severe 5. ONSET:  "When did the dizziness begin?"     1 month ago 6. AGGRAVATING FACTORS: "Does anything make it worse?" (e.g., standing, change in head position)     Trying to go from sitting to standing  7. HEART RATE: "Can you tell me your heart rate?" "How many beats in 15 seconds?"  (Note: not all patients can do this)        8. CAUSE: "What do you think is causing the dizziness?"     Maybe vertigo 9. RECURRENT SYMPTOM: "Have you had dizziness before?" If so, ask: "When was the last time?" "What happened that time?"     Yes vertigo 10.  OTHER SYMPTOMS: "Do you have any other symptoms?" (e.g., fever, chest pain, vomiting, diarrhea, bleeding)       Vomiting, also feels chantix made it worse 11. PREGNANCY: "Is there any chance you are pregnant?" "When was your last menstrual period?"       no  Protocols used: DIZZINESS [facility]

## 2018-06-05 NOTE — Assessment & Plan Note (Signed)
She has had vertigo in the past and this episode is similar, but worse Started 1 month ago and is intermittent Nausea improving after stopping Chantix and has not vomited in a couple of weeks She has taken meclizine in the past and that has worked well Her current sinus infection and taking Chantix may have influenced this vertigo episode Meclizine as needed-call if no improvement

## 2018-06-05 NOTE — Assessment & Plan Note (Signed)
Likely bacterial  Start amoxicillin Continue allergy medication, start Flonase otc cold medications Rest, fluid Call if no improvement

## 2018-06-05 NOTE — Telephone Encounter (Signed)
FYI

## 2018-06-05 NOTE — Patient Instructions (Addendum)
Take the antibiotic as prescribed.  Use the meclizine as needed for the dizziness.   Start flonase and continue your allergy medication.  Take over the counter cold medications.  Increase rest, fluids.    Call if no improvement

## 2018-06-05 NOTE — Progress Notes (Signed)
Subjective:    Patient ID: Sheryl [name], female    DOB: Sep 21, 1950, 67 y.o.   MRN: [id number]  HPI The patient is here for an acute visit.   Dizziness: Her symptoms started about one month ago.  She has vertigo.  She has had vertigo before in the past, but it has never been this bad.  When the dizziness first started she did have nausea and vomited, but she has not vomited in the past couple of weeks.  The dizziness is intermittent and she currently does not have any dizziness.  She has taken meclizine in the past.  She has been taking Chantix to help her quit smoking.  She has had significant nausea with the medication and just recently stopped it.  She thinks she may have been somewhat dehydrated because she was not drinking that much because of the nausea.  She wonders if dehydration may have influenced her being dizzy.  She started having ear pain 3-4 days ago.  She feels like she has an inner ear infection.  She states nasal congestion, hearing loss that is worse than usual-she does wear hearing aids, sinus pain and headaches.  She does have her cough, which is chronic and she thinks that is mostly from quitting smoking.  She does feel short of breath, but that is not new either.  She denies any fevers, chills or sore throat.  She has not had any wheezing.     Medications and allergies reviewed with patient and updated if appropriate.  Patient Active Problem List   Diagnosis Date Noted  . 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 CKD (chronic kidney disease) 12/27/2016  . Insomnia 06/29/2016  . Prediabetes 06/29/2016  . 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 COPD (chronic obstructive pulmonary disease) ([place]) 12/09/2014  . Lumbago 07/16/2014  . Bilateral hand pain 07/16/2014  . Nicotine addiction 06/06/2012  . HTN (hypertension) 06/04/2012  . Hyperlipidemia 06/04/2012  . Depression 06/04/2012  . ADD (attention deficit disorder)  06/04/2012  . AR (allergic rhinitis) 06/04/2012    Current Outpatient Medications on File Prior to Visit  Medication Sig Dispense Refill  . albuterol (PROVENTIL HFA;VENTOLIN HFA) 108 (90 Base) MCG/ACT inhaler Inhale 1-2 puffs into the lungs every 6 (six) hours as needed for wheezing or shortness of breath. 1 Inhaler 3  . aspirin (ASPIRIN CHILDRENS) 81 MG chewable tablet Chew 1 tablet (81 mg total) by mouth 2 (two) times daily. 60 tablet 0  . Estradiol 10 MCG TABS vaginal tablet Use 2x/ weekly for vaginal atrophy 8 tablet 11  . FLUoxetine (PROZAC) 20 MG capsule TAKE 1 CAPSULE (20 MG TOTAL) BY MOUTH DAILY. IN ADDITION TO 40 MG FOR TOTAL OF 60 MG DAILY 90 capsule 0  . FLUoxetine (PROZAC) 40 MG capsule TAKE 1 CAPSULE (40 MG TOTAL) BY MOUTH DAILY. IN ADDITION TO 20 MG FOR TOTAL OF 60 MG DAILY 90 capsule 1  . fluticasone (FLONASE) 50 MCG/ACT nasal spray Place 2 sprays into both nostrils daily. (Patient taking differently: Place 2 sprays into both nostrils as needed for allergies. ) 16 g 6  . ipratropium (ATROVENT) 0.03 % nasal spray Place 2 sprays into both nostrils 2 (two) times daily. 30 mL 0  . losartan-hydrochlorothiazide (HYZAAR) 100-12.5 MG tablet TAKE 1 TABLET BY MOUTH EVERY DAY 90 tablet 1  . Multiple Vitamin (MULTIVITAMIN) capsule Take 1 capsule by mouth as needed.     . rosuvastatin (CRESTOR) 10 MG tablet Take 1  tablet (10 mg total) by mouth daily. 90 tablet 1  . STRATTERA 100 MG capsule TAKE 1 CAPSULE BY MOUTH EVERY DAY 90 capsule 0  . traMADol (ULTRAM) 50 MG tablet Take by mouth every 6 (six) hours as needed.    . traZODone (DESYREL) 50 MG tablet TAKE 1 TABLET AT BEDTIME 90 tablet 3  . TURMERIC PO Take 1 capsule by mouth daily.    Marland Kitchen umeclidinium-vilanterol (ANORO ELLIPTA) 62.5-25 MCG/INH AEPB Inhale 1 puff into the lungs daily. 28 each 5  . varenicline (CHANTIX CONTINUING MONTH PAK) 1 MG tablet Take 1 tablet (1 mg total) by mouth 2 (two) times daily. 60 tablet 1  . varenicline (CHANTIX  STARTING MONTH PAK) 0.5 MG X 11 & 1 MG X 42 tablet Take one 0.5 mg tablet by mouth QD for 3 days, then increase to one 0.5 mg tablet twice daily for 4 days, then increase to one 1 mg BID 53 tablet 0   No current facility-administered medications on file prior to visit.     Past Medical History:  Diagnosis Date  . Allergy   . Arthritis   . COPD (chronic obstructive pulmonary disease) ([place]) 12/09/2014  . Depression   . Dyspnea    on exertion  . Hyperlipidemia   . Hypertension   . LGSIL (low grade squamous intraepithelial dysplasia)   . Osteoporosis   . Pre-diabetes     Past Surgical History:  Procedure Laterality Date  . JOINT REPLACEMENT     right knee  . MULTIPLE TOOTH EXTRACTIONS    . REPLACEMENT TOTAL KNEE  2013   right  . TOTAL KNEE ARTHROPLASTY Left 12/27/2017  . TOTAL KNEE ARTHROPLASTY Left 12/27/2017   Procedure: LEFT TOTAL KNEE ARTHROPLASTY;  Surgeon: Netta Cedars, MD;  Location: [facility];  Service: Orthopedics;  Laterality: Left;    Social History   Socioeconomic History  . Marital status: Divorced    Spouse name: Not on file  . Number of children: Not on file  . Years of education: Not on file  . Highest education level: Not on file  Occupational History  . Not on file  Social Needs  . Financial resource strain: Not on file  . Food insecurity:    Worry: Not on file    Inability: Not on file  . Transportation needs:    Medical: Not on file    Non-medical: Not on file  Tobacco Use  . Smoking status: Light Tobacco Smoker    Packs/day: 0.25    Years: 49.00    Pack years: 12.25    Types: Cigarettes  . Smokeless tobacco: Never Used  . Tobacco comment: Trying quit smoking!  Substance and Sexual Activity  . Alcohol use: No    Alcohol/week: 0.0 standard drinks  . Drug use: No  . Sexual activity: Never  Lifestyle  . Physical activity:    Days per week: Not on file    Minutes per session: Not on file  . Stress: Not on file  Relationships  . Social  connections:    Talks on phone: Not on file    Gets together: Not on file    Attends religious service: Not on file    Active member of club or organization: Not on file    Attends meetings of clubs or organizations: Not on file    Relationship status: Not on file  Other Topics Concern  . Not on file  Social History Narrative  . Not on file  Family History  Problem Relation Age of Onset  . Hyperlipidemia Mother   . Alcohol abuse Father   . Arthritis Father   . Colon polyps Father   . Alcohol abuse Maternal Uncle   . Stroke Maternal Grandmother     Review of Systems  Constitutional: Negative for chills and fever.  HENT: Positive for congestion, ear pain, hearing loss (worse than usual), sinus pain and tinnitus. Negative for sore throat.   Respiratory: Positive for cough (from stopping smoking) and shortness of breath. Negative for wheezing.   Gastrointestinal: Positive for nausea (much improved).  Neurological: Positive for dizziness and headaches.       Objective:   Vitals:   06/05/18 1115  BP: 116/76  Resp: 16  Temp: 97.8 F (36.6 C)   BP Readings from Last 3 Encounters:  06/05/18 116/76  05/06/18 138/80  12/29/17 112/68   Wt Readings from Last 3 Encounters:  06/05/18 139 lb 12.8 oz (63.4 kg)  05/06/18 139 lb (63 kg)  12/17/17 144 lb (65.3 kg)   Body mass index is 26.41 kg/m.   Physical Exam    GENERAL APPEARANCE: Appears stated age, well appearing, NAD EYES: conjunctiva clear, no icterus HEENT: bilateral tympanic membranes and ear canals normal, oropharynx with mild erythema, b/l maxillary sinus tenderness, no thyromegaly, trachea midline, no cervical or supraclavicular lymphadenopathy LUNGS: Clear to auscultation without wheeze or crackles, unlabored breathing, good air entry bilaterally CARDIOVASCULAR: Normal S1,S2 without murmurs, no edema NEURO; non-focal SKIN: Warm, dry      Assessment & Plan:    See Problem List for Assessment and Plan  of chronic medical problems.

## 2018-06-17 NOTE — Progress Notes (Signed)
Subjective:    Patient ID: Sheryl [name], female    DOB: [birth year], 67 y.o.   MRN: [id number]  HPI The patient is here for follow up.  Hypertension: She is taking her medication daily, but the past couple of days she has been out of the medication and has not taken it.  The blood pressure is elevated here today, but she states that usually controlled.  She is compliant with a low sodium diet.  She denies chest pain, palpitations, edema,  and regular headaches. She is not exercising regularly.     Hyperlipidemia: She is taking her medication daily. She is compliant with a low fat/cholesterol diet. She is not exercising regularly. She denies myalgias.   ADD:  She is taking her medication as prescribed.  She feels the medication is effective.  She denies side effects, including palpitations, headaches, lightheadedness, decreased appetite and weight loss.    Depression: She is taking her medication daily as prescribed. She denies any side effects from the medication. She feels her depression is well controlled and she is happy with her current dose of medication.   Prediabetes:  She is not compliant with a low sugar/carbohydrate diet.  She is not exercising regularly.  Insomnia:  She is taking trazodone nightly.  She feels medication does help and has not had any side effects.  Medications and allergies reviewed with patient and updated if appropriate.  Patient Active Problem List   Diagnosis Date Noted  . Vertigo 06/05/2018  . 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 CKD (chronic kidney disease) 12/27/2016  . Insomnia 06/29/2016  . Prediabetes 06/29/2016  . 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 COPD (chronic obstructive pulmonary disease) ([place]) 12/09/2014  . Lumbago 07/16/2014  . Bilateral hand pain 07/16/2014  . Nicotine addiction 06/06/2012  . HTN (hypertension) 06/04/2012  . Hyperlipidemia 06/04/2012  . Depression 06/04/2012  . ADD  (attention deficit disorder) 06/04/2012  . AR (allergic rhinitis) 06/04/2012    Current Outpatient Medications on File Prior to Visit  Medication Sig Dispense Refill  . albuterol (PROVENTIL HFA;VENTOLIN HFA) 108 (90 Base) MCG/ACT inhaler Inhale 1-2 puffs into the lungs every 6 (six) hours as needed for wheezing or shortness of breath. 1 Inhaler 3  . aspirin (ASPIRIN CHILDRENS) 81 MG chewable tablet Chew 1 tablet (81 mg total) by mouth 2 (two) times daily. 60 tablet 0  . Estradiol 10 MCG TABS vaginal tablet Use 2x/ weekly for vaginal atrophy 8 tablet 11  . FLUoxetine (PROZAC) 20 MG capsule TAKE 1 CAPSULE (20 MG TOTAL) BY MOUTH DAILY. IN ADDITION TO 40 MG FOR TOTAL OF 60 MG DAILY 90 capsule 0  . FLUoxetine (PROZAC) 40 MG capsule TAKE 1 CAPSULE (40 MG TOTAL) BY MOUTH DAILY. IN ADDITION TO 20 MG FOR TOTAL OF 60 MG DAILY 90 capsule 1  . fluticasone (FLONASE) 50 MCG/ACT nasal spray Place 2 sprays into both nostrils daily. (Patient taking differently: Place 2 sprays into both nostrils as needed for allergies. ) 16 g 6  . ipratropium (ATROVENT) 0.03 % nasal spray Place 2 sprays into both nostrils 2 (two) times daily. 30 mL 0  . losartan-hydrochlorothiazide (HYZAAR) 100-12.5 MG tablet TAKE 1 TABLET BY MOUTH EVERY DAY 90 tablet 1  . meclizine (ANTIVERT) 25 MG tablet Take 1 tablet (25 mg total) by mouth 3 (three) times daily as needed for dizziness. 30 tablet 0  . Multiple Vitamin (MULTIVITAMIN) capsule Take 1 capsule by mouth as needed.     Marland Kitchen  rosuvastatin (CRESTOR) 10 MG tablet Take 1 tablet (10 mg total) by mouth daily. 90 tablet 1  . STRATTERA 100 MG capsule TAKE 1 CAPSULE BY MOUTH EVERY DAY 90 capsule 0  . traMADol (ULTRAM) 50 MG tablet Take by mouth every 6 (six) hours as needed.    . traZODone (DESYREL) 50 MG tablet TAKE 1 TABLET AT BEDTIME 90 tablet 3  . TURMERIC PO Take 1 capsule by mouth daily.    Marland Kitchen umeclidinium-vilanterol (ANORO ELLIPTA) 62.5-25 MCG/INH AEPB Inhale 1 puff into the lungs daily. 28  each 5   No current facility-administered medications on file prior to visit.     Past Medical History:  Diagnosis Date  . Allergy   . Arthritis   . COPD (chronic obstructive pulmonary disease) ([place]) 12/09/2014  . Depression   . Dyspnea    on exertion  . Hyperlipidemia   . Hypertension   . LGSIL (low grade squamous intraepithelial dysplasia)   . Osteoporosis   . Pre-diabetes     Past Surgical History:  Procedure Laterality Date  . JOINT REPLACEMENT     right knee  . MULTIPLE TOOTH EXTRACTIONS    . REPLACEMENT TOTAL KNEE  2013   right  . TOTAL KNEE ARTHROPLASTY Left 12/27/2017  . TOTAL KNEE ARTHROPLASTY Left 12/27/2017   Procedure: LEFT TOTAL KNEE ARTHROPLASTY;  Surgeon: Netta Cedars, MD;  Location: [place];  Service: Orthopedics;  Laterality: Left;    Social History   Socioeconomic History  . Marital status: Divorced    Spouse name: Not on file  . Number of children: Not on file  . Years of education: Not on file  . Highest education level: Not on file  Occupational History  . Not on file  Social Needs  . Financial resource strain: Not on file  . Food insecurity:    Worry: Not on file    Inability: Not on file  . Transportation needs:    Medical: Not on file    Non-medical: Not on file  Tobacco Use  . Smoking status: Light Tobacco Smoker    Packs/day: 0.25    Years: 49.00    Pack years: 12.25    Types: Cigarettes  . Smokeless tobacco: Never Used  . Tobacco comment: Trying quit smoking!  Substance and Sexual Activity  . Alcohol use: No    Alcohol/week: 0.0 standard drinks  . Drug use: No  . Sexual activity: Never  Lifestyle  . Physical activity:    Days per week: Not on file    Minutes per session: Not on file  . Stress: Not on file  Relationships  . Social connections:    Talks on phone: Not on file    Gets together: Not on file    Attends religious service: Not on file    Active member of club or organization: Not on file    Attends meetings of  clubs or organizations: Not on file    Relationship status: Not on file  Other Topics Concern  . Not on file  Social History Narrative  . Not on file    Family History  Problem Relation Age of Onset  . Hyperlipidemia Mother   . Alcohol abuse Father   . Arthritis Father   . Colon polyps Father   . Alcohol abuse Maternal Uncle   . Stroke Maternal Grandmother     Review of Systems  Constitutional: Negative for chills and fever.  Respiratory: Positive for shortness of breath (improved). Negative for cough  and wheezing.   Cardiovascular: Negative for chest pain, palpitations and leg swelling.  Neurological: Negative for light-headedness and headaches.       Objective:   Vitals:   06/18/18 0926  BP: (!) 160/94  Pulse: 93  Resp: 16  Temp: 98.3 F (36.8 C)  SpO2: 96%   BP Readings from Last 3 Encounters:  06/18/18 (!) 160/94  06/05/18 116/76  05/06/18 138/80   Wt Readings from Last 3 Encounters:  06/18/18 139 lb 12.8 oz (63.4 kg)  06/05/18 139 lb 12.8 oz (63.4 kg)  05/06/18 139 lb (63 kg)   Body mass index is 26.41 kg/m.   Physical Exam    Constitutional: Appears well-developed and well-nourished. No distress.  HENT:  Head: Normocephalic and atraumatic.  Neck: Neck supple. No tracheal deviation present. No thyromegaly present.  No cervical lymphadenopathy Cardiovascular: Normal rate, regular rhythm and normal heart sounds.   No murmur heard. No carotid bruit .  No edema Pulmonary/Chest: Effort normal and breath sounds normal. No respiratory distress. No has no wheezes. No rales.  Skin: Skin is warm and dry. Not diaphoretic.  Psychiatric: Normal mood and affect. Behavior is normal.      Assessment & Plan:    See Problem List for Assessment and Plan of chronic medical problems.

## 2018-06-18 ENCOUNTER — Encounter: Payer: Self-pay | Admitting: Internal Medicine

## 2018-06-18 ENCOUNTER — Ambulatory Visit: Payer: Medicare Other | Admitting: Internal Medicine

## 2018-06-18 ENCOUNTER — Other Ambulatory Visit (INDEPENDENT_AMBULATORY_CARE_PROVIDER_SITE_OTHER): Payer: Medicare Other

## 2018-06-18 VITALS — BP 160/94 | HR 93 | Temp 98.3°F | Resp 16 | Ht 61.0 in | Wt 139.8 lb

## 2018-06-18 DIAGNOSIS — G4700 Insomnia, unspecified: Secondary | ICD-10-CM

## 2018-06-18 DIAGNOSIS — I10 Essential (primary) hypertension: Secondary | ICD-10-CM

## 2018-06-18 DIAGNOSIS — E782 Mixed hyperlipidemia: Secondary | ICD-10-CM | POA: Diagnosis not present

## 2018-06-18 DIAGNOSIS — F988 Other specified behavioral and emotional disorders with onset usually occurring in childhood and adolescence: Secondary | ICD-10-CM

## 2018-06-18 DIAGNOSIS — R7303 Prediabetes: Secondary | ICD-10-CM

## 2018-06-18 DIAGNOSIS — F329 Major depressive disorder, single episode, unspecified: Secondary | ICD-10-CM

## 2018-06-18 DIAGNOSIS — F32A Depression, unspecified: Secondary | ICD-10-CM

## 2018-06-18 LAB — CBC WITH DIFFERENTIAL/PLATELET
BASOS ABS: 0.2 10*3/uL — AB (ref 0.0–0.1)
BASOS PCT: 1.8 % (ref 0.0–3.0)
EOS ABS: 1 10*3/uL — AB (ref 0.0–0.7)
Eosinophils Relative: 9.8 % — ABNORMAL HIGH (ref 0.0–5.0)
HEMATOCRIT: 36.1 % (ref 36.0–46.0)
HEMOGLOBIN: 12.4 g/dL (ref 12.0–15.0)
LYMPHS PCT: 19.7 % (ref 12.0–46.0)
Lymphs Abs: 1.9 10*3/uL (ref 0.7–4.0)
MCHC: 34.4 g/dL (ref 30.0–36.0)
MCV: 92.1 fl (ref 78.0–100.0)
Monocytes Absolute: 0.5 10*3/uL (ref 0.1–1.0)
Monocytes Relative: 5.4 % (ref 3.0–12.0)
Neutro Abs: 6.2 10*3/uL (ref 1.4–7.7)
Neutrophils Relative %: 63.3 % (ref 43.0–77.0)
Platelets: 402 10*3/uL — ABNORMAL HIGH (ref 150.0–400.0)
RBC: 3.92 Mil/uL (ref 3.87–5.11)
RDW: 13 % (ref 11.5–15.5)
WBC: 9.7 10*3/uL (ref 4.0–10.5)

## 2018-06-18 LAB — COMPREHENSIVE METABOLIC PANEL
ALBUMIN: 4.1 g/dL (ref 3.5–5.2)
ALT: 27 U/L (ref 0–35)
AST: 19 U/L (ref 0–37)
Alkaline Phosphatase: 133 U/L — ABNORMAL HIGH (ref 39–117)
BUN: 26 mg/dL — ABNORMAL HIGH (ref 6–23)
CALCIUM: 9.5 mg/dL (ref 8.4–10.5)
CHLORIDE: 104 meq/L (ref 96–112)
CO2: 26 mEq/L (ref 19–32)
CREATININE: 1.28 mg/dL — AB (ref 0.40–1.20)
GFR: 44.09 mL/min — AB (ref >60.00)
Glucose, Bld: 87 mg/dL (ref 70–99)
Potassium: 4.2 mEq/L (ref 3.5–5.1)
Sodium: 139 mEq/L (ref 135–145)
Total Bilirubin: 0.4 mg/dL (ref 0.2–1.2)
Total Protein: 7.8 g/dL (ref 6.0–8.3)

## 2018-06-18 LAB — LIPID PANEL
CHOLESTEROL: 181 mg/dL (ref 0–200)
HDL: 54.1 mg/dL (ref >39.00)
LDL Cholesterol: 105 mg/dL — ABNORMAL HIGH (ref 0–99)
NonHDL: 127.17
TRIGLYCERIDES: 112 mg/dL (ref 0.0–149.0)
Total CHOL/HDL Ratio: 3
VLDL: 22.4 mg/dL (ref 0.0–40.0)

## 2018-06-18 LAB — HEMOGLOBIN A1C: Hgb A1c MFr Bld: 5.7 % (ref 4.6–6.5)

## 2018-06-18 MED ORDER — FLUOXETINE HCL 20 MG PO CAPS: ORAL_CAPSULE | ORAL | 0 refills | Status: DC

## 2018-06-18 MED ORDER — LOSARTAN POTASSIUM-HCTZ 100-12.5 MG PO TABS: 1.0000 | ORAL_TABLET | Freq: Every day | ORAL | 1 refills | Status: DC

## 2018-06-18 MED ORDER — FLUOXETINE HCL 40 MG PO CAPS: 40.0000 mg | ORAL_CAPSULE | Freq: Every day | ORAL | 1 refills | Status: DC

## 2018-06-18 MED ORDER — ATOMOXETINE HCL 100 MG PO CAPS: ORAL_CAPSULE | ORAL | 1 refills | Status: DC

## 2018-06-18 NOTE — Assessment & Plan Note (Signed)
Controlled, stable Continue current dose of medication  

## 2018-06-18 NOTE — Assessment & Plan Note (Signed)
Trazodone does help, but still does not sleep great No side effects with that in combination with fluoxetine Will continue, but will not increase the dose Recommended regular exercise, which may improve her sleep

## 2018-06-18 NOTE — Assessment & Plan Note (Signed)
Check lipid panel  Continue daily statin Regular exercise and healthy diet encouraged  

## 2018-06-18 NOTE — Assessment & Plan Note (Signed)
Blood pressure elevated here today, but she did not take her medication today or yesterday Restart medication-stressed that she cannot go without her medication. Ideally monitor blood pressure at home CMP

## 2018-06-18 NOTE — Assessment & Plan Note (Signed)
Check a1c Low sugar / carb diet Stressed regular exercise   

## 2018-06-18 NOTE — Patient Instructions (Addendum)

## 2018-06-24 ENCOUNTER — Other Ambulatory Visit: Payer: Self-pay

## 2018-06-24 MED ORDER — HYDROCHLOROTHIAZIDE 12.5 MG PO TABS: 12.5000 mg | ORAL_TABLET | Freq: Every day | ORAL | 1 refills | Status: DC

## 2018-06-24 MED ORDER — LOSARTAN POTASSIUM 100 MG PO TABS: 100.0000 mg | ORAL_TABLET | Freq: Every day | ORAL | 1 refills | Status: DC

## 2018-07-03 ENCOUNTER — Ambulatory Visit (INDEPENDENT_AMBULATORY_CARE_PROVIDER_SITE_OTHER): Payer: Medicare Other | Admitting: *Deleted

## 2018-07-03 VITALS — BP 116/62 | HR 96 | Resp 17 | Ht 61.0 in | Wt 139.0 lb

## 2018-07-03 DIAGNOSIS — Z0000 Encounter for general adult medical examination without abnormal findings: Secondary | ICD-10-CM

## 2018-07-03 DIAGNOSIS — Z1231 Encounter for screening mammogram for malignant neoplasm of breast: Secondary | ICD-10-CM

## 2018-07-03 DIAGNOSIS — E2839 Other primary ovarian failure: Secondary | ICD-10-CM | POA: Diagnosis not present

## 2018-07-03 NOTE — Progress Notes (Addendum)
Subjective:   Sheryl [name] is a 67 y.o. female who presents for Medicare Annual (Subsequent) preventive examination.  Review of Systems:  No ROS.  Medicare Wellness Visit. Additional risk factors are reflected in the social history.  Cardiac Risk Factors include: advanced age (>41men, >39 women);dyslipidemia;hypertension Sleep patterns: gets up 3-4 times nightly to void and sleeps 5 hours nightly. Patient reports insomnia issues, discussed recommended sleep tips. Relevant patient education assigned to patient using Emmi.   Home Safety/Smoke Alarms: Feels safe in home. Smoke alarms in place.  Living environment; residence and Firearm Safety: 1-story house/ trailer, no firearms. Lives with daughter, no needs for DME, good support system Seat Belt Safety/Bike Helmet: Wears seat belt.    Objective:     Vitals: BP 116/62   Pulse 96   Resp 17   Ht 5\' 1"  (1.549 m)   Wt 139 lb (63 kg)   SpO2 98%   BMI 26.26 kg/m   Body mass index is 26.26 kg/m.  Advanced Directives 07/03/2018 12/27/2017 12/16/2017 07/02/2017 03/11/2016  Does Patient Have a Medical Advance Directive? No No No No No  Would patient like information on creating a medical advance directive? No - Patient declined No - Patient declined Yes (MAU/Ambulatory/Procedural Areas - Information given) Yes (ED - Information included in AVS) -    Tobacco Social History   Tobacco Use  Smoking Status Former Smoker  . Packs/day: 0.25  . Years: 49.00  . Pack years: 12.25  . Types: Cigarettes  . Last attempt to quit: 06/02/2018  . Years since quitting: 0.0  Smokeless Tobacco Never Used  Tobacco Comment   Trying quit smoking!     Counseling given: Not Answered Comment: Trying quit smoking!  Past Medical History:  Diagnosis Date  . Allergy   . Arthritis   . COPD (chronic obstructive pulmonary disease) ([place]) 12/09/2014  . Depression   . Dyspnea    on exertion  . Hyperlipidemia   . Hypertension   . LGSIL (low grade squamous  intraepithelial dysplasia)   . Osteoporosis   . Pre-diabetes    Past Surgical History:  Procedure Laterality Date  . JOINT REPLACEMENT     right knee  . MULTIPLE TOOTH EXTRACTIONS    . REPLACEMENT TOTAL KNEE  2013   right  . TOTAL KNEE ARTHROPLASTY Left 12/27/2017  . TOTAL KNEE ARTHROPLASTY Left 12/27/2017   Procedure: LEFT TOTAL KNEE ARTHROPLASTY;  Surgeon: Netta Cedars, MD;  Location: [place];  Service: Orthopedics;  Laterality: Left;   Family History  Problem Relation Age of Onset  . Hyperlipidemia Mother   . Alcohol abuse Father   . Arthritis Father   . Colon polyps Father   . Alcohol abuse Maternal Uncle   . Stroke Maternal Grandmother    Social History   Socioeconomic History  . Marital status: Divorced    Spouse name: Not on file  . Number of children: 2  . Years of education: Not on file  . Highest education level: Not on file  Occupational History  . Not on file  Social Needs  . Financial resource strain: Not hard at all  . Food insecurity:    Worry: Never true    Inability: Never true  . Transportation needs:    Medical: No    Non-medical: No  Tobacco Use  . Smoking status: Former Smoker    Packs/day: 0.25    Years: 49.00    Pack years: 12.25    Types: Cigarettes  Last attempt to quit: 06/02/2018    Years since quitting: 0.0  . Smokeless tobacco: Never Used  . Tobacco comment: Trying quit smoking!  Substance and Sexual Activity  . Alcohol use: No    Alcohol/week: 0.0 standard drinks  . Drug use: No  . Sexual activity: Not Currently  Lifestyle  . Physical activity:    Days per week: 0 days    Minutes per session: 0 min  . Stress: Not at all  Relationships  . Social connections:    Talks on phone: More than three times a week    Gets together: More than three times a week    Attends religious service: 1 to 4 times per year    Active member of club or organization: Yes    Attends meetings of clubs or organizations: More than 4 times per year      Relationship status: Divorced  Other Topics Concern  . Not on file  Social History Narrative  . Not on file    Outpatient Encounter Medications as of 07/03/2018  Medication Sig  . albuterol (PROVENTIL HFA;VENTOLIN HFA) 108 (90 Base) MCG/ACT inhaler Inhale 1-2 puffs into the lungs every 6 (six) hours as needed for wheezing or shortness of breath.  Marland Kitchen atomoxetine (STRATTERA) 100 MG capsule TAKE 1 CAPSULE BY MOUTH EVERY DAY  . Estradiol 10 MCG TABS vaginal tablet Use 2x/ weekly for vaginal atrophy  . FLUoxetine (PROZAC) 20 MG capsule Take 1 capsule (20 mg total) by mouth daily in addition to 40 mg for a total of 60 mg  . FLUoxetine (PROZAC) 40 MG capsule Take 1 capsule (40 mg total) by mouth daily. In addition to 20 mg for total of 60 mg daily  . fluticasone (FLONASE) 50 MCG/ACT nasal spray Place 2 sprays into both nostrils daily. (Patient taking differently: Place 2 sprays into both nostrils as needed for allergies. )  . hydrochlorothiazide (HYDRODIURIL) 12.5 MG tablet Take 1 tablet (12.5 mg total) by mouth daily.  Marland Kitchen ipratropium (ATROVENT) 0.03 % nasal spray Place 2 sprays into both nostrils 2 (two) times daily.  Marland Kitchen losartan (COZAAR) 100 MG tablet Take 1 tablet (100 mg total) by mouth daily.  . meclizine (ANTIVERT) 25 MG tablet Take 1 tablet (25 mg total) by mouth 3 (three) times daily as needed for dizziness.  . Multiple Vitamin (MULTIVITAMIN) capsule Take 1 capsule by mouth as needed.   . rosuvastatin (CRESTOR) 10 MG tablet Take 1 tablet (10 mg total) by mouth daily.  . traZODone (DESYREL) 50 MG tablet TAKE 1 TABLET AT BEDTIME  . TURMERIC PO Take 1 capsule by mouth daily.  Marland Kitchen umeclidinium-vilanterol (ANORO ELLIPTA) 62.5-25 MCG/INH AEPB Inhale 1 puff into the lungs daily.  . traMADol (ULTRAM) 50 MG tablet Take by mouth every 6 (six) hours as needed.  . [DISCONTINUED] aspirin (ASPIRIN CHILDRENS) 81 MG chewable tablet Chew 1 tablet (81 mg total) by mouth 2 (two) times daily. (Patient not  taking: Reported on 07/03/2018)   No facility-administered encounter medications on file as of 07/03/2018.     Activities of Daily Living In your present state of health, do you have any difficulty performing the following activities: 07/03/2018 12/27/2017  Hearing? N -  Vision? N -  Difficulty concentrating or making decisions? N -  Walking or climbing stairs? N -  Dressing or bathing? N -  Doing errands, shopping? N Y  Conservation officer, nature and eating ? N -  Using the Toilet? N -  In the past six  months, have you accidently leaked urine? N -  Do you have problems with loss of bowel control? N -  Managing your Medications? N -  Managing your Finances? N -  Housekeeping or managing your Housekeeping? N -  Some recent data might be hidden    Patient Care Team: Binnie Rail, MD as PCP - General (Internal Medicine)    Assessment:   This is a routine wellness examination for Sheryl [name]. Physical assessment deferred to PCP.   Exercise Activities and Dietary recommendations Current Exercise Habits: The patient does not participate in regular exercise at present  Diet (meal preparation, eat out, water intake, caffeinated beverages, dairy products, fruits and vegetables): in general, a "healthy" diet  , well balanced   Reviewed heart healthy diet. Encouraged patient to increase daily water and healthy fluid intake. Relevant patient education assigned to patient using Emmi.  Goals    . Patient Stated     Increase my physical activity by starting to use pilates machine I have and start to go to the gym.       Fall Risk Fall Risk  07/03/2018 07/02/2017 06/19/2017 02/18/2017 03/22/2016  Falls in the past year? 0 No No No Yes  Number falls in past yr: - - - - 1  Injury with Fall? - - - - Yes   Depression Screen PHQ 2/9 Scores 07/03/2018 07/02/2017 06/19/2017 02/18/2017  PHQ - 2 Score 1 2 0 0  PHQ- 9 Score 5 3 - -     Cognitive Function MMSE - Mini Mental State Exam 07/02/2017    Orientation to time 5  Orientation to Place 5  Registration 3  Attention/ Calculation 4  Recall 2  Language- name 2 objects 2  Language- repeat 1  Language- follow 3 step command 3  Language- read & follow direction 1  Write a sentence 1  Copy design 1  Total score 28       Ad8 score reviewed for issues:  Issues making decisions: no  Less interest in hobbies / activities: no  Repeats questions, stories (family complaining): no  Trouble using ordinary gadgets (microwave, computer, phone):no  Forgets the month or year: no  Mismanaging finances: no  Remembering appts: no  Daily problems with thinking and/or memory: no Ad8 score is= 0  Immunization History  Administered Date(s) Administered  . Influenza Split 06/04/2012  . Influenza, High Dose Seasonal PF 06/29/2016, 06/19/2017, 05/06/2018  . Influenza, Seasonal, Injecte, Preservative Fre 07/19/2013  . Influenza,inj,Quad PF,6+ Mos 06/01/2015  . Pneumococcal Conjugate-13 07/19/2013  . Pneumococcal Polysaccharide-23 11/09/2015  . Td 09/22/2014   Screening Tests Health Maintenance  Topic Date Due  . DEXA SCAN  08/06/2015  . MAMMOGRAM  02/19/2019  . COLONOSCOPY  09/10/2022  . TETANUS/TDAP  09/22/2024  . INFLUENZA VACCINE  Completed  . Hepatitis C Screening  Completed  . PNA vac Low Risk Adult  Completed      Plan:      Continue doing brain stimulating activities (puzzles, reading, adult coloring books, staying active) to keep memory sharp.   Continue to eat heart healthy diet (full of fruits, vegetables, whole grains, lean protein, water--limit salt, fat, and sugar intake) and increase physical activity as tolerated.  I have personally reviewed and noted the following in the patient's chart:   . Medical and social history . Use of alcohol, tobacco or illicit drugs  . Current medications and supplements . Functional ability and status . Nutritional status . Physical activity . Advanced  directives . List of other physicians . Vitals . Screenings to include cognitive, depression, and falls . Referrals and appointments  In addition, I have reviewed and discussed with patient certain preventive protocols, quality metrics, and best practice recommendations. A written personalized care plan for preventive services as well as general preventive health recommendations were provided to patient.     Michiel Cowboy, RN  07/03/2018    Medical screening examination/treatment/procedure(s) were performed by non-physician practitioner and as supervising physician I was immediately available for consultation/collaboration. I agree with above. Binnie Rail, MD

## 2018-07-03 NOTE — Patient Instructions (Addendum)
[facility] in [place], [place] in: [place] Address: [address] [place], [place] [address] Phone: [phone]  Continue doing brain stimulating activities (puzzles, reading, adult coloring books, staying active) to keep memory sharp.   Continue to eat heart healthy diet (full of fruits, vegetables, whole grains, lean protein, water--limit salt, fat, and sugar intake) and increase physical activity as tolerated.   Sheryl [name] , Thank you for taking time to come for your Medicare Wellness Visit. I appreciate your ongoing commitment to your health goals. Please review the following plan we discussed and let me know if I can assist you in the future.   These are the goals we discussed: Goals    . Patient Stated     Increase my physical activity by starting to use pilates machine I have and start to go to the gym.       This is a list of the screening recommended for you and due dates:  Health Maintenance  Topic Date Due  . DEXA scan (bone density measurement)  08/06/2015  . Mammogram  02/19/2019  . Colon Cancer Screening  09/10/2022  . Tetanus Vaccine  09/22/2024  . Flu Shot  Completed  .  Hepatitis C: One time screening is recommended by Center for Disease Control  (CDC) for  adults born from [birth year] through 1966.   Completed  . Pneumonia vaccines  Completed   Health Maintenance, Female Adopting a healthy lifestyle and getting preventive care can go a long way to promote health and wellness. Talk with your health care provider about what schedule of regular examinations is right for you. This is a good chance for you to check in with your provider about disease prevention and staying healthy. In between checkups, there are plenty of things you can do on your own. Experts have done a lot of research about which lifestyle changes and preventive measures are most likely to keep you healthy. Ask your  health care provider for more information. Weight and diet Eat a healthy diet  Be sure to include plenty of vegetables, fruits, low-fat dairy products, and lean protein.  Do not eat a lot of foods high in solid fats, added sugars, or salt.  Get regular exercise. This is one of the most important things you can do for your health. ? Most adults should exercise for at least 150 minutes each week. The exercise should increase your heart rate and make you sweat (moderate-intensity exercise). ? Most adults should also do strengthening exercises at least twice a week. This is in addition to the moderate-intensity exercise.  Maintain a healthy weight  Body mass index (BMI) is a measurement that can be used to identify possible weight problems. It estimates body fat based on height and weight. Your health care provider can help determine your BMI and help you achieve or maintain a healthy weight.  For females 62 years of age and older: ? A BMI below 18.5 is considered underweight. ? A BMI of 18.5 to 24.9 is normal. ? A BMI of 25 to 29.9 is considered overweight. ? A BMI of 30 and above is considered obese.  Watch levels of cholesterol and blood lipids  You should start having your blood tested for lipids and cholesterol at 68 years of age, then have this test every 5 years.  You may need to have your cholesterol levels checked more often if: ? Your lipid or cholesterol  levels are high. ? You are older than 68 years of age. ? You are at high risk for heart disease.  Cancer screening Lung Cancer  Lung cancer screening is recommended for adults 20-9 years old who are at high risk for lung cancer because of a history of smoking.  A yearly low-dose CT scan of the lungs is recommended for people who: ? Currently smoke. ? Have quit within the past 15 years. ? Have at least a 30-pack-year history of smoking. A pack year is smoking an average of one pack of cigarettes a day for 1  year.  Yearly screening should continue until it has been 15 years since you quit.  Yearly screening should stop if you develop a health problem that would prevent you from having lung cancer treatment.  Breast Cancer  Practice breast self-awareness. This means understanding how your breasts normally appear and feel.  It also means doing regular breast self-exams. Let your health care provider know about any changes, no matter how small.  If you are in your 20s or 30s, you should have a clinical breast exam (CBE) by a health care provider every 1-3 years as part of a regular health exam.  If you are 47 or older, have a CBE every year. Also consider having a breast X-ray (mammogram) every year.  If you have a family history of breast cancer, talk to your health care provider about genetic screening.  If you are at high risk for breast cancer, talk to your health care provider about having an MRI and a mammogram every year.  Breast cancer gene (BRCA) assessment is recommended for women who have family members with BRCA-related cancers. BRCA-related cancers include: ? Breast. ? Ovarian. ? Tubal. ? Peritoneal cancers.  Results of the assessment will determine the need for genetic counseling and BRCA1 and BRCA2 testing.  Cervical Cancer Your health care provider may recommend that you be screened regularly for cancer of the pelvic organs (ovaries, uterus, and vagina). This screening involves a pelvic examination, including checking for microscopic changes to the surface of your cervix (Pap test). You may be encouraged to have this screening done every 3 years, beginning at age 53.  For women ages 69-65, health care providers may recommend pelvic exams and Pap testing every 3 years, or they may recommend the Pap and pelvic exam, combined with testing for human papilloma virus (HPV), every 5 years. Some types of HPV increase your risk of cervical cancer. Testing for HPV may also be done on  women of any age with unclear Pap test results.  Other health care providers may not recommend any screening for nonpregnant women who are considered low risk for pelvic cancer and who do not have symptoms. Ask your health care provider if a screening pelvic exam is right for you.  If you have had past treatment for cervical cancer or a condition that could lead to cancer, you need Pap tests and screening for cancer for at least 20 years after your treatment. If Pap tests have been discontinued, your risk factors (such as having a new sexual partner) need to be reassessed to determine if screening should resume. Some women have medical problems that increase the chance of getting cervical cancer. In these cases, your health care provider may recommend more frequent screening and Pap tests.  Colorectal Cancer  This type of cancer can be detected and often prevented.  Routine colorectal cancer screening usually begins at 68 years of age and continues  through 68 years of age.  Your health care provider may recommend screening at an earlier age if you have risk factors for colon cancer.  Your health care provider may also recommend using home test kits to check for hidden blood in the stool.  A small camera at the end of a tube can be used to examine your colon directly (sigmoidoscopy or colonoscopy). This is done to check for the earliest forms of colorectal cancer.  Routine screening usually begins at age 65.  Direct examination of the colon should be repeated every 5-10 years through 68 years of age. However, you may need to be screened more often if early forms of precancerous polyps or small growths are found.  Skin Cancer  Check your skin from head to toe regularly.  Tell your health care provider about any new moles or changes in moles, especially if there is a change in a mole's shape or color.  Also tell your health care provider if you have a mole that is larger than the size of a  pencil eraser.  Always use sunscreen. Apply sunscreen liberally and repeatedly throughout the day.  Protect yourself by wearing long sleeves, pants, a wide-brimmed hat, and sunglasses whenever you are outside.  Heart disease, diabetes, and high blood pressure  High blood pressure causes heart disease and increases the risk of stroke. High blood pressure is more likely to develop in: ? People who have blood pressure in the high end of the normal range (130-139/85-89 mm Hg). ? People who are overweight or obese. ? People who are African American.  If you are 29-13 years of age, have your blood pressure checked every 3-5 years. If you are 59 years of age or older, have your blood pressure checked every year. You should have your blood pressure measured twice-once when you are at a hospital or clinic, and once when you are not at a hospital or clinic. Record the average of the two measurements. To check your blood pressure when you are not at a hospital or clinic, you can use: ? An automated blood pressure machine at a pharmacy. ? A home blood pressure monitor.  If you are between 51 years and 54 years old, ask your health care provider if you should take aspirin to prevent strokes.  Have regular diabetes screenings. This involves taking a blood sample to check your fasting blood sugar level. ? If you are at a normal weight and have a low risk for diabetes, have this test once every three years after 68 years of age. ? If you are overweight and have a high risk for diabetes, consider being tested at a younger age or more often. Preventing infection Hepatitis B  If you have a higher risk for hepatitis B, you should be screened for this virus. You are considered at high risk for hepatitis B if: ? You were born in a country where hepatitis B is common. Ask your health care provider which countries are considered high risk. ? Your parents were born in a high-risk country, and you have not been  immunized against hepatitis B (hepatitis B vaccine). ? You have HIV or AIDS. ? You use needles to inject street drugs. ? You live with someone who has hepatitis B. ? You have had sex with someone who has hepatitis B. ? You get hemodialysis treatment. ? You take certain medicines for conditions, including cancer, organ transplantation, and autoimmune conditions.  Hepatitis C  Blood testing is recommended for: ?  Everyone born from [birth year] through 1966. ? Anyone with known risk factors for hepatitis C.  Sexually transmitted infections (STIs)  You should be screened for sexually transmitted infections (STIs) including gonorrhea and chlamydia if: ? You are sexually active and are younger than 68 years of age. ? You are older than 68 years of age and your health care provider tells you that you are at risk for this type of infection. ? Your sexual activity has changed since you were last screened and you are at an increased risk for chlamydia or gonorrhea. Ask your health care provider if you are at risk.  If you do not have HIV, but are at risk, it may be recommended that you take a prescription medicine daily to prevent HIV infection. This is called pre-exposure prophylaxis (PrEP). You are considered at risk if: ? You are sexually active and do not regularly use condoms or know the HIV status of your partner(s). ? You take drugs by injection. ? You are sexually active with a partner who has HIV.  Talk with your health care provider about whether you are at high risk of being infected with HIV. If you choose to begin PrEP, you should first be tested for HIV. You should then be tested every 3 months for as long as you are taking PrEP. Pregnancy  If you are premenopausal and you may become pregnant, ask your health care provider about preconception counseling.  If you may become pregnant, take 400 to 800 micrograms (mcg) of folic acid every day.  If you want to prevent pregnancy, talk to your  health care provider about birth control (contraception). Osteoporosis and menopause  Osteoporosis is a disease in which the bones lose minerals and strength with aging. This can result in serious bone fractures. Your risk for osteoporosis can be identified using a bone density scan.  If you are 44 years of age or older, or if you are at risk for osteoporosis and fractures, ask your health care provider if you should be screened.  Ask your health care provider whether you should take a calcium or vitamin D supplement to lower your risk for osteoporosis.  Menopause may have certain physical symptoms and risks.  Hormone replacement therapy may reduce some of these symptoms and risks. Talk to your health care provider about whether hormone replacement therapy is right for you. Follow these instructions at home:  Schedule regular health, dental, and eye exams.  Stay current with your immunizations.  Do not use any tobacco products including cigarettes, chewing tobacco, or electronic cigarettes.  If you are pregnant, do not drink alcohol.  If you are breastfeeding, limit how much and how often you drink alcohol.  Limit alcohol intake to no more than 1 drink per day for nonpregnant women. One drink equals 12 ounces of beer, 5 ounces of Ceasar Decandia, or 1 ounces of hard liquor.  Do not use street drugs.  Do not share needles.  Ask your health care provider for help if you need support or information about quitting drugs.  Tell your health care provider if you often feel depressed.  Tell your health care provider if you have ever been abused or do not feel safe at home. This information is not intended to replace advice given to you by your health care provider. Make sure you discuss any questions you have with your health care provider. Document Released: 02/19/2011 Document Revised: 01/12/2016 Document Reviewed: 05/10/2015 Elsevier Interactive Patient Education  [employer].

## 2018-08-09 ENCOUNTER — Other Ambulatory Visit: Payer: Self-pay | Admitting: Internal Medicine

## 2018-10-19 ENCOUNTER — Emergency Department (HOSPITAL_COMMUNITY)
Admission: EM | Admit: 2018-10-19 | Discharge: 2018-10-19 | Disposition: A | Discharge status: Home / Self Care | Payer: Medicare Other | Attending: Emergency Medicine | Admitting: Emergency Medicine

## 2018-10-19 ENCOUNTER — Other Ambulatory Visit: Payer: Self-pay

## 2018-10-19 ENCOUNTER — Emergency Department (HOSPITAL_COMMUNITY): Payer: Medicare Other

## 2018-10-19 ENCOUNTER — Encounter (HOSPITAL_COMMUNITY): Payer: Self-pay | Admitting: Emergency Medicine

## 2018-10-19 DIAGNOSIS — Y999 Unspecified external cause status: Secondary | ICD-10-CM | POA: Diagnosis not present

## 2018-10-19 DIAGNOSIS — Y939 Activity, unspecified: Secondary | ICD-10-CM | POA: Diagnosis not present

## 2018-10-19 DIAGNOSIS — S46911A Strain of unspecified muscle, fascia and tendon at shoulder and upper arm level, right arm, initial encounter: Secondary | ICD-10-CM | POA: Insufficient documentation

## 2018-10-19 DIAGNOSIS — Z96651 Presence of right artificial knee joint: Secondary | ICD-10-CM | POA: Diagnosis not present

## 2018-10-19 DIAGNOSIS — Z87891 Personal history of nicotine dependence: Secondary | ICD-10-CM | POA: Diagnosis not present

## 2018-10-19 DIAGNOSIS — Y929 Unspecified place or not applicable: Secondary | ICD-10-CM | POA: Diagnosis not present

## 2018-10-19 DIAGNOSIS — Z79899 Other long term (current) drug therapy: Secondary | ICD-10-CM | POA: Insufficient documentation

## 2018-10-19 DIAGNOSIS — N189 Chronic kidney disease, unspecified: Secondary | ICD-10-CM | POA: Insufficient documentation

## 2018-10-19 DIAGNOSIS — J449 Chronic obstructive pulmonary disease, unspecified: Secondary | ICD-10-CM | POA: Diagnosis not present

## 2018-10-19 DIAGNOSIS — W109XXA Fall (on) (from) unspecified stairs and steps, initial encounter: Secondary | ICD-10-CM | POA: Insufficient documentation

## 2018-10-19 DIAGNOSIS — Z96652 Presence of left artificial knee joint: Secondary | ICD-10-CM | POA: Diagnosis not present

## 2018-10-19 DIAGNOSIS — S8002XA Contusion of left knee, initial encounter: Secondary | ICD-10-CM | POA: Insufficient documentation

## 2018-10-19 DIAGNOSIS — S0083XA Contusion of other part of head, initial encounter: Secondary | ICD-10-CM | POA: Diagnosis not present

## 2018-10-19 DIAGNOSIS — I129 Hypertensive chronic kidney disease with stage 1 through stage 4 chronic kidney disease, or unspecified chronic kidney disease: Secondary | ICD-10-CM | POA: Diagnosis not present

## 2018-10-19 DIAGNOSIS — S80912A Unspecified superficial injury of left knee, initial encounter: Secondary | ICD-10-CM | POA: Diagnosis present

## 2018-10-19 DIAGNOSIS — W19XXXA Unspecified fall, initial encounter: Secondary | ICD-10-CM

## 2018-10-19 MED ORDER — TRAMADOL HCL 50 MG PO TABS: 50.0000 mg | ORAL_TABLET | Freq: Four times a day (QID) | ORAL | 0 refills | Status: DC | PRN

## 2018-10-19 MED ORDER — ACETAMINOPHEN 325 MG PO TABS
650.0000 mg | ORAL_TABLET | Freq: Once | ORAL | Status: AC
  Administered 2018-10-19: 650 mg via ORAL
  Filled 2018-10-19: qty 2

## 2018-10-19 NOTE — ED Provider Notes (Signed)
[facility] DEPT Provider Note   CSN: [id number] Arrival date & time: 10/19/18  1920    History   Chief Complaint Chief Complaint  Patient presents with  . Fall    HPI Sheryl [name] is a 68 y.o. female.     The history is provided by the patient. No language interpreter was used.  Fall      68 year old female with history of osteoporosis presenting for evaluation of a recent fall.  Patient report this evening she was walking down the steps, and down to the last step she missed step, fell forward, striking her left knee against the ground, she reached out with her arms to break the fall and has pain to her right shoulder.  She also struck her head against the ground but denies any loss of consciousness.  The pain is sharp, throbbing, moderate in severity.  She denies any significant headache just mild forehead pain.  She denies any neck pain, chest pain, abdominal pain, back pain or hip pain.  She was able to ambulate afterward.  She mention having a torn ligament to her right shoulder diagnosed on MRI a week ago.  She felt that this fall may have worsened her pain.  She follow-ups with orthopedist, Dr. Veverly Fells.  She denies any focal numbness or weakness.  She denies any precipitating symptoms prior to the fall.  Past Medical History:  Diagnosis Date  . Allergy   . Arthritis   . COPD (chronic obstructive pulmonary disease) ([place]) 12/09/2014  . Depression   . Dyspnea    on exertion  . Hyperlipidemia   . Hypertension   . LGSIL (low grade squamous intraepithelial dysplasia)   . Osteoporosis   . Pre-diabetes     Patient Active Problem List   Diagnosis Date Noted  . Vertigo 06/05/2018  . 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 CKD (chronic kidney disease) 12/27/2016  . Insomnia 06/29/2016  . Prediabetes 06/29/2016  . 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 COPD (chronic obstructive pulmonary disease) ([place])  12/09/2014  . Lumbago 07/16/2014  . Bilateral hand pain 07/16/2014  . Nicotine addiction 06/06/2012  . HTN (hypertension) 06/04/2012  . Hyperlipidemia 06/04/2012  . Depression 06/04/2012  . ADD (attention deficit disorder) 06/04/2012  . AR (allergic rhinitis) 06/04/2012    Past Surgical History:  Procedure Laterality Date  . JOINT REPLACEMENT     right knee  . MULTIPLE TOOTH EXTRACTIONS    . REPLACEMENT TOTAL KNEE  2013   right  . TOTAL KNEE ARTHROPLASTY Left 12/27/2017  . TOTAL KNEE ARTHROPLASTY Left 12/27/2017   Procedure: LEFT TOTAL KNEE ARTHROPLASTY;  Surgeon: Netta Cedars, MD;  Location: [facility];  Service: Orthopedics;  Laterality: Left;     OB History    Gravida  2   Para  2   Term      Preterm      AB      Living  2     SAB      TAB      Ectopic      Multiple      Live Births               Home Medications    Prior to Admission medications   Medication Sig Start Date End Date Taking? Authorizing Provider  albuterol (PROVENTIL HFA;VENTOLIN HFA) 108 (90 Base) MCG/ACT inhaler Inhale 1-2 puffs into the lungs every 6 (six) hours as needed for wheezing or  shortness of breath. 03/22/16   Tereasa Coop, PA-C  ANORO ELLIPTA 62.5-25 MCG/INH AEPB TAKE 1 PUFF BY MOUTH EVERY DAY 08/11/18   Binnie Rail, MD  atomoxetine (STRATTERA) 100 MG capsule TAKE 1 CAPSULE BY MOUTH EVERY DAY 06/18/18   Binnie Rail, MD  Estradiol 10 MCG TABS vaginal tablet Use 2x/ weekly for vaginal atrophy 02/23/16   Terrance Mass, MD  FLUoxetine (PROZAC) 20 MG capsule Take 1 capsule (20 mg total) by mouth daily in addition to 40 mg for a total of 60 mg 06/18/18   Binnie Rail, MD  FLUoxetine (PROZAC) 40 MG capsule Take 1 capsule (40 mg total) by mouth daily. In addition to 20 mg for total of 60 mg daily 06/18/18   Binnie Rail, MD  fluticasone [facility]) 50 MCG/ACT nasal spray Place 2 sprays into both nostrils daily. Patient taking differently: Place 2 sprays into both nostrils  as needed for allergies.  01/23/16   [name], MD  hydrochlorothiazide (HYDRODIURIL) 12.5 MG tablet Take 1 tablet (12.5 mg total) by mouth daily. 06/24/18   Binnie Rail, MD  ipratropium (ATROVENT) 0.03 % nasal spray Place 2 sprays into both nostrils 2 (two) times daily. 11/11/15   Leandrew Koyanagi, MD  losartan (COZAAR) 100 MG tablet Take 1 tablet (100 mg total) by mouth daily. 06/24/18   Binnie Rail, MD  meclizine (ANTIVERT) 25 MG tablet Take 1 tablet (25 mg total) by mouth 3 (three) times daily as needed for dizziness. 06/05/18   Binnie Rail, MD  Multiple Vitamin (MULTIVITAMIN) capsule Take 1 capsule by mouth as needed.     [provider]  rosuvastatin (CRESTOR) 10 MG tablet Take 1 tablet (10 mg total) by mouth daily. 05/06/18   Binnie Rail, MD  traMADol (ULTRAM) 50 MG tablet Take by mouth every 6 (six) hours as needed.    [provider]  traZODone (DESYREL) 50 MG tablet TAKE 1 TABLET AT BEDTIME 12/17/17   Burns, Claudina Lick, MD  TURMERIC PO Take 1 capsule by mouth daily.    [provider]    Family History Family History  Problem Relation Age of Onset  . Hyperlipidemia Mother   . Alcohol abuse Father   . Arthritis Father   . Colon polyps Father   . Alcohol abuse Maternal Uncle   . Stroke Maternal Grandmother     Social History Social History   Tobacco Use  . Smoking status: Former Smoker    Packs/day: 0.25    Years: 49.00    Pack years: 12.25    Types: Cigarettes    Last attempt to quit: 06/02/2018    Years since quitting: 0.3  . Smokeless tobacco: Never Used  . Tobacco comment: Trying quit smoking!  Substance Use Topics  . Alcohol use: No    Alcohol/week: 0.0 standard drinks  . Drug use: No     Allergies   Macrobid [nitrofurantoin monohyd macro]   Review of Systems Review of Systems  All other systems reviewed and are negative.    Physical Exam Updated Vital Signs BP (!) 143/76   Pulse (!) 110   Temp 97.7 F (36.5  C) (Oral)   Resp 19   Ht 5' (1.524 m)   Wt 63.5 kg   SpO2 99%   BMI 27.34 kg/m   Physical Exam Vitals signs and nursing note reviewed.  Constitutional:      General: She is not in acute distress.  Appearance: She is well-developed.  HENT:     Head:     Comments: Mild swelling noted to right forehead with mild tenderness but no crepitus. Eyes:     Conjunctiva/sclera: Conjunctivae normal.  Neck:     Musculoskeletal: Neck supple.  Cardiovascular:     Rate and Rhythm: Tachycardia present.  Musculoskeletal:        General: Tenderness (Right shoulder: Mild diffuse tenderness to the deltoid without any deformity, range of motion intact.) present.     Comments: Left knee: Tenderness to anterior knee with normal knee flexion extension.  Skin:    Findings: No rash.  Neurological:     Mental Status: She is alert.     Comments: Able to ambulate.      ED Treatments / Results  Labs (all labs ordered are listed, but only abnormal results are displayed) Labs Reviewed - No data to display  EKG None  Radiology Dg Shoulder Right  Result Date: 10/19/2018 CLINICAL DATA:  Pain after fall EXAM: RIGHT SHOULDER - 2+ VIEW COMPARISON:  June 04, 2012 FINDINGS: There is no evidence of fracture or dislocation. There is no evidence of arthropathy or other focal bone abnormality. Soft tissues are unremarkable. IMPRESSION: Negative. Electronically Signed   By: Dorise Bullion III M.D   On: 10/19/2018 20:40   Dg Knee Complete 4 Views Left  Result Date: 10/19/2018 CLINICAL DATA:  Pain after fall. EXAM: LEFT KNEE - COMPLETE 4+ VIEW COMPARISON:  None. FINDINGS: The patient is status post left knee replacement. Hardware is in good position. No fracture or joint effusion. IMPRESSION: Left knee replacement. Hardware in good position. No fracture or joint effusion. Electronically Signed   By: Dorise Bullion III M.D   On: 10/19/2018 20:39    Procedures Procedures (including critical care  time)  Medications Ordered in ED Medications  acetaminophen (TYLENOL) tablet 650 mg (650 mg Oral Given 10/19/18 2208)     Initial Impression / Assessment and Plan / ED Course  I have reviewed the triage vital signs and the nursing notes.  Pertinent labs & imaging results that were available during my care of the patient were reviewed by me and considered in my medical decision making (see chart for details).        BP (!) 143/80 (BP Location: Left Arm)   Pulse 89   Temp 97.7 F (36.5 C) (Oral)   Resp 18   Ht 5' (1.524 m)   Wt 63.5 kg   SpO2 100%   BMI 27.34 kg/m    Final Clinical Impressions(s) / ED Diagnoses   Final diagnoses:  Fall, initial encounter  Contusion of face, initial encounter  Strain of right shoulder, initial encounter  Contusion of left knee, initial encounter    ED Discharge Orders         Ordered    traMADol (ULTRAM) 50 MG tablet  Every 6 hours PRN     10/19/18 2214         10:05 PM Patient had a mechanical fall when she walked down the steps.  She struck her head, now having pain to her right forehead, right shoulder, and left knee.  Appropriate x-rays ordered of right shoulder and left knee show no acute fracture or dislocation.  Patient mentating appropriately.  Rice therapy discussed.  Referral given to orthopedist DR. Norris.    Domenic Moras, PA-C 10/19/18 2215    Julianne Rice, MD 10/20/18 [phone]

## 2018-10-19 NOTE — ED Triage Notes (Signed)
Patient reports trip and fall down stairs today. States fell down one stair. Reports hitting head. C/o right shoulder and left knee pain. Denies taking blood thinners. Denies LOC. Ambulatory.

## 2018-11-06 ENCOUNTER — Telehealth: Payer: Self-pay

## 2018-11-06 NOTE — Telephone Encounter (Signed)
LVM for pt to call back and schedule a surgical clearance appointment with Dr. Quay Burow for right reverse total shoulder surgery. Received by a fax a surgical clearance form.

## 2018-11-11 ENCOUNTER — Other Ambulatory Visit: Payer: Self-pay | Admitting: Internal Medicine

## 2018-11-11 DIAGNOSIS — F988 Other specified behavioral and emotional disorders with onset usually occurring in childhood and adolescence: Secondary | ICD-10-CM

## 2018-11-13 ENCOUNTER — Other Ambulatory Visit: Payer: Self-pay | Admitting: Internal Medicine

## 2018-11-13 MED ORDER — FLUOXETINE HCL 40 MG PO CAPS: 40.0000 mg | ORAL_CAPSULE | Freq: Every day | ORAL | 1 refills | Status: DC

## 2018-12-10 ENCOUNTER — Other Ambulatory Visit: Payer: Self-pay | Admitting: Internal Medicine

## 2018-12-10 DIAGNOSIS — E7800 Pure hypercholesterolemia, unspecified: Secondary | ICD-10-CM

## 2018-12-10 NOTE — Telephone Encounter (Signed)
Due for a 6 month f/u - call to schedule virtual

## 2018-12-10 NOTE — Telephone Encounter (Signed)
LVM for pt to call back in regards.  

## 2018-12-16 NOTE — Telephone Encounter (Signed)
Left another VM for pt to call back to set up virtual follow up.

## 2018-12-17 NOTE — Progress Notes (Signed)
Virtual Visit via Video Note  I connected with Sheryl [name] on 12/18/18 at 10:45 AM EDT by a video enabled telemedicine application and verified that I am speaking with the correct person using two identifiers.   I discussed the limitations of evaluation and management by telemedicine and the availability of in person appointments. The patient expressed understanding and agreed to proceed.  The patient is currently at home and I am in the office.    No referring provider.    History of Present Illness: She is here for follow up of her chronic medical conditions.   She is exercising regularly - walking some.    Hypertension: She is taking her medication daily. She is compliant with a low sodium diet.  She denies chest pain, palpitations, edema, shortness of breath and regular headaches. She does not monitor her blood pressure at home.    Hyperlipidemia: She is taking her medication daily. She is compliant with a low fat/cholesterol diet. She denies myalgias.   ADD:  She is taking her medication as prescribed.  She feels the medication is effective.  She denies side effects, including palpitations, headaches, lightheadedness, decreased appetite and weight loss.    Depression: She is taking her medication daily as prescribed. She denies any side effects from the medication. She feels her depression is well controlled and she is happy with her current dose of medication.   Prediabetes:  She is compliant with a low sugar/carbohydrate diet.  She is exercising regularly.  Insomnia:  She takes trazodone nightly.  She denies side effects.  She did not sleep well for two nights this week.     CKD:  She is not drinking enough water.  She does not takes any nsaids.  COPD:    She quit smoking.  She uses anoro every day.  She still has some SOB.  She denies cough or wheeze.     Review of Systems  Constitutional: Negative for chills and fever.  Respiratory: Positive for shortness of breath.  Negative for cough and wheezing.   Cardiovascular: Negative for chest pain, palpitations and leg swelling.  Neurological: Negative for headaches.  Psychiatric/Behavioral: The patient has insomnia.      Social History   Socioeconomic History  . Marital status: Divorced    Spouse name: Not on file  . Number of children: 2  . Years of education: Not on file  . Highest education level: Not on file  Occupational History  . Not on file  Social Needs  . Financial resource strain: Not hard at all  . Food insecurity:    Worry: Never true    Inability: Never true  . Transportation needs:    Medical: No    Non-medical: No  Tobacco Use  . Smoking status: Former Smoker    Packs/day: 0.25    Years: 49.00    Pack years: 12.25    Types: Cigarettes    Last attempt to quit: 06/02/2018    Years since quitting: 0.5  . Smokeless tobacco: Never Used  . Tobacco comment: Trying quit smoking!  Substance and Sexual Activity  . Alcohol use: No    Alcohol/week: 0.0 standard drinks  . Drug use: No  . Sexual activity: Not Currently  Lifestyle  . Physical activity:    Days per week: 0 days    Minutes per session: 0 min  . Stress: Not at all  Relationships  . Social connections:    Talks on phone: More than three times a  week    Gets together: More than three times a week    Attends religious service: 1 to 4 times per year    Active member of club or organization: Yes    Attends meetings of clubs or organizations: More than 4 times per year    Relationship status: Divorced  Other Topics Concern  . Not on file  Social History Narrative  . Not on file     Observations/Objective: Appears well in NAD Normal mood and affect  Assessment and Plan:  See Problem List for Assessment and Plan of chronic medical problems.   Follow Up Instructions:    I discussed the assessment and treatment plan with the patient. The patient was provided an opportunity to ask questions and all were  answered. The patient agreed with the plan and demonstrated an understanding of the instructions.   The patient was advised to call back or seek an in-person evaluation if the symptoms worsen or if the condition fails to improve as anticipated.  FU in 3 months  Binnie Rail, MD

## 2018-12-18 ENCOUNTER — Ambulatory Visit (INDEPENDENT_AMBULATORY_CARE_PROVIDER_SITE_OTHER): Payer: Medicare Other | Admitting: Internal Medicine

## 2018-12-18 ENCOUNTER — Encounter: Payer: Self-pay | Admitting: Internal Medicine

## 2018-12-18 ENCOUNTER — Other Ambulatory Visit: Payer: Self-pay | Admitting: Internal Medicine

## 2018-12-18 DIAGNOSIS — R7303 Prediabetes: Secondary | ICD-10-CM | POA: Diagnosis not present

## 2018-12-18 DIAGNOSIS — G4700 Insomnia, unspecified: Secondary | ICD-10-CM

## 2018-12-18 DIAGNOSIS — N183 Chronic kidney disease, stage 3 unspecified: Secondary | ICD-10-CM

## 2018-12-18 DIAGNOSIS — I10 Essential (primary) hypertension: Secondary | ICD-10-CM | POA: Diagnosis not present

## 2018-12-18 DIAGNOSIS — E7800 Pure hypercholesterolemia, unspecified: Secondary | ICD-10-CM

## 2018-12-18 DIAGNOSIS — F988 Other specified behavioral and emotional disorders with onset usually occurring in childhood and adolescence: Secondary | ICD-10-CM

## 2018-12-18 DIAGNOSIS — E782 Mixed hyperlipidemia: Secondary | ICD-10-CM

## 2018-12-18 DIAGNOSIS — J449 Chronic obstructive pulmonary disease, unspecified: Secondary | ICD-10-CM

## 2018-12-18 MED ORDER — HYDROCHLOROTHIAZIDE 12.5 MG PO TABS: 12.5000 mg | ORAL_TABLET | Freq: Every day | ORAL | 1 refills | Status: DC

## 2018-12-18 MED ORDER — ROSUVASTATIN CALCIUM 10 MG PO TABS: 10.0000 mg | ORAL_TABLET | Freq: Every day | ORAL | 1 refills | Status: DC

## 2018-12-18 MED ORDER — LOSARTAN POTASSIUM 100 MG PO TABS: 100.0000 mg | ORAL_TABLET | Freq: Every day | ORAL | 1 refills | Status: DC

## 2018-12-18 NOTE — Assessment & Plan Note (Signed)
Does not drink enough water- encouraged her to increase her water intake Does not take any nsaids F/u in 3 months -will need a cmp

## 2018-12-18 NOTE — Assessment & Plan Note (Signed)
Controlled, stable Continue current dose of medication  

## 2018-12-18 NOTE — Assessment & Plan Note (Signed)
Lab Results  Component Value Date   HGBA1C 5.7 06/18/2018   F/u in 3 months Low sugar / carb diet Stressed regular exercise

## 2018-12-18 NOTE — Assessment & Plan Note (Signed)
Uses Anoro daily Still has some SOB, but no wheeze or cough Continue regular exercise Albuterol prn Continue anoro daily

## 2018-12-18 NOTE — Assessment & Plan Note (Signed)
BP Readings from Last 3 Encounters:  10/19/18 (!) 143/80  07/03/18 116/62  06/18/18 (!) 160/94   She does not check her BP at home  Will continue current medications at current doses Follow up in 3 months

## 2018-12-18 NOTE — Assessment & Plan Note (Signed)
Continue crestor 

## 2018-12-18 NOTE — Assessment & Plan Note (Signed)
Controlled, stable Continue current dose of medication -  trazodone  

## 2018-12-18 NOTE — Assessment & Plan Note (Signed)
Controlled, stable No side effects Continue current dose of medication - Strattera

## 2018-12-20 ENCOUNTER — Other Ambulatory Visit: Payer: Self-pay | Admitting: Internal Medicine

## 2018-12-20 DIAGNOSIS — F988 Other specified behavioral and emotional disorders with onset usually occurring in childhood and adolescence: Secondary | ICD-10-CM

## 2019-02-17 ENCOUNTER — Telehealth: Payer: Self-pay

## 2019-02-17 NOTE — Telephone Encounter (Signed)
No need to come in for a visit - form filled out - send last labs and ekg

## 2019-02-17 NOTE — Telephone Encounter (Signed)
Copied from [place] [phone]. Topic: General - Other >> Feb 17, 2019 10:41 AM Yvette Rack wrote: Reason for CRM: Pt stated she can not be cleared for surgery without Dr. Quay Burow signing off approving her for surgery. Pt stated a fax was sent to Dr. Quay Burow and she would like Dr. Quay Burow to sign off on it so she can have the surgery.

## 2019-02-17 NOTE — Telephone Encounter (Signed)
I documented in chart on 3/17 that I tried to call pt to get something scheduled for a surgical clearance. Since then she has been here for a follow up. Her last EKG was last year. Did you still want her to come in for a preop clearance?

## 2019-02-18 NOTE — Telephone Encounter (Signed)
Form as well as EKG and labs were faxed. LVM letting pt know

## 2019-02-19 ENCOUNTER — Ambulatory Visit: Payer: Self-pay

## 2019-02-19 NOTE — Telephone Encounter (Signed)
Patient called and she says last night after eating dinner, her lower abdomen right in the pubic area has been cramping and hurting. She says she ate a chicken and kale patty that she thawed out and ate, so she says maybe it was that. She says maybe she left it out too long to thaw out. She says last night she took imodium for the pain last night, no diarrhea, and this morning she was not able to have a good BM, she says she passed a few, hard balls. She says the pain is better than it was last night, a 4-5 right now. She says she's lying down trying to go to sleep and says it's getting better as she's talking to me. She denies any other symptoms. She says she doesn't feel like going anywhere right now, I advised she would have a virtual visit with a provider, she doesn't have to go into the office. She says she's feeling better and she will just see how it goes. She says if she can get some sleep, which she didn't last night, she will feel better. I advised to call back if the pain doesn't go away, she verbalized understanding.  Answer Assessment - Initial Assessment Questions 1. LOCATION: "Where does it hurt?"      Lower abdomen in the middle 2. RADIATION: "Does the pain shoot anywhere else?" (e.g., chest, back)     No 3. ONSET: "When did the pain begin?" (e.g., minutes, hours or days ago)      After eating last night 4. SUDDEN: "Gradual or sudden onset?"     Sudden 5. PATTERN "Does the pain come and go, or is it constant?"    - If constant: "Is it getting better, staying the same, or worsening?"      (Note: Constant means the pain never goes away completely; most serious pain is constant and it progresses)     - If intermittent: "How long does it last?" "Do you have pain now?"     (Note: Intermittent means the pain goes away completely between bouts)     Constant 6. SEVERITY: "How bad is the pain?"  (e.g., Scale 1-10; mild, moderate, or severe)   - MILD (1-3): doesn't interfere with normal  activities, abdomen soft and not tender to touch    - MODERATE (4-7): interferes with normal activities or awakens from sleep, tender to touch    - SEVERE (8-10): excruciating pain, doubled over, unable to do any normal activities      4-5 7. RECURRENT SYMPTOM: "Have you ever had this type of abdominal pain before?" If so, ask: "When was the last time?" and "What happened that time?"      No, not like this 8. CAUSE: "What do you think is causing the abdominal pain?"     It may be what I ate; chicken burger with kale mixed in it, thawed it out 9. RELIEVING/AGGRAVATING FACTORS: "What makes it better or worse?" (e.g., movement, antacids, bowel movement)     Being still 10. OTHER SYMPTOMS: "Has there been any vomiting, diarrhea, constipation, or urine problems?"     No 11. PREGNANCY: "Is there any chance you are pregnant?" "When was your last menstrual period?"      No  Protocols used: ABDOMINAL PAIN - [facility]

## 2019-02-19 NOTE — Telephone Encounter (Signed)
FYI

## 2019-03-06 ENCOUNTER — Other Ambulatory Visit: Payer: Self-pay | Admitting: Internal Medicine

## 2019-03-24 NOTE — Progress Notes (Signed)
Need orders for surgery . Pre-op appt is 03-26-2019. Thanks

## 2019-03-24 NOTE — Patient Instructions (Addendum)
YOU NEED TO HAVE A COVID 19 TEST ON___Thursday August 6___ @______ , THIS TEST MUST BE DONE BEFORE SURGERY, COME  [place] , [id number]. ONCE YOUR COVID TEST IS COMPLETED, PLEASE BEGIN THE QUARANTINEINSTRUCTIONS AS OUTLINED IN YOUR HANDOUT.                Sheryl [name]    Your procedure is scheduled on: 03-30-2019   Report to [place]  Entrance     Report to admitting at 7:15AM    1 VISITOR IS ALLOWED TO WAIT IN WAITING ROOM  ONLY DAY OF YOUR SURGERY.     Call this number if you have problems the morning of surgery [phone]    Remember: Do not eat food After Midnight. YOU MAY HAVE CLEAR LIQUIDS FROM MIDNIGHT UNTIL 6:45AM. At 6:45AM Please finish the prescribed Pre-Surgery Gatorade drink. Nothing by mouth after you finish the Gatorade drink !   BRUSH YOUR TEETH MORNING OF SURGERY AND RINSE YOUR MOUTH OUT, NO CHEWING GUM CANDY OR MINTS.      CLEAR LIQUID DIET   Foods Allowed                                                                     Foods Excluded  Coffee and tea, regular and decaf                             liquids that you cannot  Plain Jell-O any favor except red or purple                                           see through such as: Fruit ices (not with fruit pulp)                                     milk, soups, orange juice  Iced Popsicles                                    All solid food Carbonated beverages, regular and diet                                    Cranberry, grape and apple juices Sports drinks like Gatorade Lightly seasoned clear broth or consume(fat free) Sugar, honey syrup  Sample Menu Breakfast                                Lunch                                     Supper Cranberry juice                    Beef broth  Chicken broth Jell-O                                     Grape juice                           Apple juice Coffee or tea                        Jell-O                                       Popsicle                                                Coffee or tea                        Coffee or tea  _____________________________________________________________________       Take these medicines the morning of surgery with A SIP OF WATER: fluoxetine, rosuvastatin, anoro ellipta inhaler, atrovent, albuterol inhaler if needed, flonase if needed                                 You may not have any metal on your body including hair pins and              piercings  Do not wear jewelry, make-up, lotions, powders or perfumes, deodorant             Do not wear nail polish.  Do not shave  48 hours prior to surgery.              Do not bring valuables to the hospital. [place].  Contacts, dentures or bridgework may not be worn into surgery.  Leave suitcase in the car. After surgery it may be brought to your room.     Patients discharged the day of surgery will not be allowed to drive home. IF YOU ARE HAVING SURGERY AND GOING HOME THE SAME DAY, YOU MUST HAVE AN ADULT TO DRIVE YOU HOME AND BE WITH YOU FOR 24 HOURS. YOU MAY GO HOME BY TAXI OR UBER OR ORTHERWISE, BUT AN ADULT MUST ACCOMPANY YOU HOME AND STAY WITH YOU FOR 24 HOURS.  Name and phone number of your driver:  Special Instructions: N/A              Please read over the following fact sheets you were given: _____________________________________________________________________             [facility] - Preparing for Surgery Before surgery, you can play an important role.  Because skin is not sterile, your skin needs to be as free of germs as possible.  You can reduce the number of germs on your skin by washing with CHG (chlorahexidine gluconate) soap before surgery.  CHG is an antiseptic cleaner which kills germs and bonds with the skin to continue killing germs even after washing. Please DO NOT use  if you have an allergy to CHG or  antibacterial soaps.  If your skin becomes reddened/irritated stop using the CHG and inform your nurse when you arrive at Short Stay. Do not shave (including legs and underarms) for at least 48 hours prior to the first CHG shower.  You may shave your face/neck. Please follow these instructions carefully:  1.  Shower with CHG Soap the night before surgery and the  morning of Surgery.  2.  If you choose to wash your hair, wash your hair first as usual with your  normal  shampoo.  3.  After you shampoo, rinse your hair and body thoroughly to remove the  shampoo.                           4.  Use CHG as you would any other liquid soap.  You can apply chg directly  to the skin and wash                       Gently with a scrungie or clean washcloth.  5.  Apply the CHG Soap to your body ONLY FROM THE NECK DOWN.   Do not use on face/ open                           Wound or open sores. Avoid contact with eyes, ears mouth and genitals (private parts).                       Wash face,  Genitals (private parts) with your normal soap.             6.  Wash thoroughly, paying special attention to the area where your surgery  will be performed.  7.  Thoroughly rinse your body with warm water from the neck down.  8.  DO NOT shower/wash with your normal soap after using and rinsing off  the CHG Soap.                9.  Pat yourself dry with a clean towel.            10.  Wear clean pajamas.            11.  Place clean sheets on your bed the night of your first shower and do not  sleep with pets. Day of Surgery : Do not apply any lotions/deodorants the morning of surgery.  Please wear clean clothes to the hospital/surgery center.  FAILURE TO FOLLOW THESE INSTRUCTIONS MAY RESULT IN THE CANCELLATION OF YOUR SURGERY PATIENT SIGNATURE_________________________________  NURSE SIGNATURE__________________________________  ________________________________________________________________________     Sheryl [name]  An incentive spirometer is a tool that can help keep your lungs clear and active. This tool measures how well you are filling your lungs with each breath. Taking long deep breaths may help reverse or decrease the chance of developing breathing (pulmonary) problems (especially infection) following:  A long period of time when you are unable to move or be active. BEFORE THE PROCEDURE   If the spirometer includes an indicator to show your best effort, your nurse or respiratory therapist will set it to a desired goal.  If possible, sit up straight or lean slightly forward. Try not to slouch.  Hold the incentive spirometer in an upright position. INSTRUCTIONS FOR USE  1. Sit on the edge of your bed if  possible, or sit up as far as you can in bed or on a chair. 2. Hold the incentive spirometer in an upright position. 3. Breathe out normally. 4. Place the mouthpiece in your mouth and seal your lips tightly around it. 5. Breathe in slowly and as deeply as possible, raising the piston or the ball toward the top of the column. 6. Hold your breath for 3-5 seconds or for as long as possible. Allow the piston or ball to fall to the bottom of the column. 7. Remove the mouthpiece from your mouth and breathe out normally. 8. Rest for a few seconds and repeat Steps 1 through 7 at least 10 times every 1-2 hours when you are awake. Take your time and take a few normal breaths between deep breaths. 9. The spirometer may include an indicator to show your best effort. Use the indicator as a goal to work toward during each repetition. 10. After each set of 10 deep breaths, practice coughing to be sure your lungs are clear. If you have an incision (the cut made at the time of surgery), support your incision when coughing by placing a pillow or rolled up towels firmly against it. Once you are able to get out of bed, walk around indoors and cough well. You may stop using the incentive spirometer when  instructed by your caregiver.  RISKS AND COMPLICATIONS  Take your time so you do not get dizzy or light-headed.  If you are in pain, you may need to take or ask for pain medication before doing incentive spirometry. It is harder to take a deep breath if you are having pain. AFTER USE  Rest and breathe slowly and easily.  It can be helpful to keep track of a log of your progress. Your caregiver can provide you with a simple table to help with this. If you are using the spirometer at home, follow these instructions: [place] IF:   You are having difficultly using the spirometer.  You have trouble using the spirometer as often as instructed.  Your pain medication is not giving enough relief while using the spirometer.  You develop fever of 100.5 F (38.1 C) or higher. SEEK IMMEDIATE MEDICAL CARE IF:   You cough up bloody sputum that had not been present before.  You develop fever of 102 F (38.9 C) or greater.  You develop worsening pain at or near the incision site. MAKE SURE YOU:   Understand these instructions.  Will watch your condition.  Will get help right away if you are not doing well or get worse. Document Released: 12/17/2006 Document Revised: 10/29/2011 Document Reviewed: 02/17/2007 [facility] Patient Information 2014 [place], [place].   ________________________________________________________________________

## 2019-03-26 ENCOUNTER — Encounter (HOSPITAL_COMMUNITY)
Admission: RE | Admit: 2019-03-26 | Discharge: 2019-03-26 | Disposition: A | Discharge status: Home / Self Care | Payer: Medicare Other | Source: Ambulatory Visit | Attending: Orthopedic Surgery | Admitting: Orthopedic Surgery

## 2019-03-26 ENCOUNTER — Other Ambulatory Visit: Payer: Self-pay

## 2019-03-26 ENCOUNTER — Other Ambulatory Visit (HOSPITAL_COMMUNITY)
Admission: RE | Admit: 2019-03-26 | Discharge: 2019-03-26 | Disposition: A | Discharge status: Home / Self Care | Payer: Medicare Other | Source: Ambulatory Visit | Attending: Orthopedic Surgery | Admitting: Orthopedic Surgery

## 2019-03-26 ENCOUNTER — Encounter (HOSPITAL_COMMUNITY): Payer: Self-pay

## 2019-03-26 DIAGNOSIS — R9431 Abnormal electrocardiogram [ECG] [EKG]: Secondary | ICD-10-CM | POA: Diagnosis not present

## 2019-03-26 DIAGNOSIS — M19011 Primary osteoarthritis, right shoulder: Secondary | ICD-10-CM | POA: Diagnosis not present

## 2019-03-26 DIAGNOSIS — Z20828 Contact with and (suspected) exposure to other viral communicable diseases: Secondary | ICD-10-CM | POA: Diagnosis not present

## 2019-03-26 DIAGNOSIS — I10 Essential (primary) hypertension: Secondary | ICD-10-CM | POA: Diagnosis not present

## 2019-03-26 DIAGNOSIS — Z01818 Encounter for other preprocedural examination: Secondary | ICD-10-CM | POA: Insufficient documentation

## 2019-03-26 HISTORY — DX: Other specified behavioral and emotional disorders with onset usually occurring in childhood and adolescence: F98.8

## 2019-03-26 HISTORY — DX: Presence of external hearing-aid: Z97.4

## 2019-03-26 HISTORY — DX: Chronic kidney disease, stage 3 unspecified: N18.30

## 2019-03-26 LAB — BASIC METABOLIC PANEL
Anion gap: 9 (ref 5–15)
BUN: 21 mg/dL (ref 8–23)
CO2: 25 mmol/L (ref 22–32)
Calcium: 9.4 mg/dL (ref 8.9–10.3)
Chloride: 107 mmol/L (ref 98–111)
Creatinine, Ser: 1.18 mg/dL — ABNORMAL HIGH (ref 0.44–1.00)
GFR calc Af Amer: 55 mL/min — ABNORMAL LOW (ref >60)
GFR calc non Af Amer: 47 mL/min — ABNORMAL LOW (ref >60)
Glucose, Bld: 99 mg/dL (ref 70–99)
Potassium: 4 mmol/L (ref 3.5–5.1)
Sodium: 141 mmol/L (ref 135–145)

## 2019-03-26 LAB — CBC
HCT: 38.3 % (ref 36.0–46.0)
Hemoglobin: 12 g/dL (ref 12.0–15.0)
MCH: 29.7 pg (ref 26.0–34.0)
MCHC: 31.3 g/dL (ref 30.0–36.0)
MCV: 94.8 fL (ref 80.0–100.0)
Platelets: 383 10*3/uL (ref 150–400)
RBC: 4.04 MIL/uL (ref 3.87–5.11)
RDW: 13.6 % (ref 11.5–15.5)
WBC: 9.7 10*3/uL (ref 4.0–10.5)
nRBC: 0 % (ref 0.0–0.2)

## 2019-03-26 LAB — SARS CORONAVIRUS 2 (TAT 6-24 HRS): SARS Coronavirus 2: NEGATIVE

## 2019-03-26 LAB — SURGICAL PCR SCREEN
MRSA, PCR: NEGATIVE
Staphylococcus aureus: NEGATIVE

## 2019-03-26 LAB — HEMOGLOBIN A1C
Hgb A1c MFr Bld: 5.7 % — ABNORMAL HIGH (ref 4.8–5.6)
Mean Plasma Glucose: 116.89 mg/dL

## 2019-03-26 NOTE — H&P (Signed)
Patient's anticipated LOS is less than 2 midnights, meeting these requirements: - Younger than 65 - Lives within 1 hour of care - Has a competent adult at home to recover with post-op recover - NO history of  - Chronic pain requiring opiods  - Diabetes  - Coronary Artery Disease  - Heart failure  - Heart attack  - Stroke  - DVT/VTE  - Cardiac arrhythmia  - Respiratory Failure/COPD  - Renal failure  - Anemia  - Advanced Liver disease       Sheryl [name] is an 68 y.o. female.    Chief Complaint: right shoulder pain/weakness  HPI: Pt is a 68 y.o. female complaining of right shoulder pain for multiple years. Pain had continually increased since the beginning. X-rays in the clinic show end-stage arthritic changes of the right shoulder. Pt has tried various conservative treatments which have failed to alleviate their symptoms, including injections and therapy. Various options are discussed with the patient. Risks, benefits and expectations were discussed with the patient. Patient understand the risks, benefits and expectations and wishes to proceed with surgery.   PCP:  Binnie Rail, MD  D/C Plans: Home  PMH: Past Medical History:  Diagnosis Date   Allergy    Arthritis    COPD (chronic obstructive pulmonary disease) ([place]) 12/09/2014   Depression    Dyspnea    on exertion   Hyperlipidemia    Hypertension    LGSIL (low grade squamous intraepithelial dysplasia)    Osteoporosis    Pre-diabetes     PSH: Past Surgical History:  Procedure Laterality Date   JOINT REPLACEMENT     right knee   MULTIPLE TOOTH EXTRACTIONS     REPLACEMENT TOTAL KNEE  2013   right   TOTAL KNEE ARTHROPLASTY Left 12/27/2017   TOTAL KNEE ARTHROPLASTY Left 12/27/2017   Procedure: LEFT TOTAL KNEE ARTHROPLASTY;  Surgeon: Netta Cedars, MD;  Location: [place];  Service: Orthopedics;  Laterality: Left;    Social History:  reports that she quit smoking about 9 months ago. Her smoking  use included cigarettes. She has a 12.25 pack-year smoking history. She has never used smokeless tobacco. She reports that she does not drink alcohol or use drugs.  Allergies:  Allergies  Allergen Reactions   Macrobid [Nitrofurantoin Monohyd Macro] Nausea And Vomiting    GI upset    Medications: No current facility-administered medications for this encounter.    Current Outpatient Medications  Medication Sig Dispense Refill   albuterol (PROVENTIL HFA;VENTOLIN HFA) 108 (90 Base) MCG/ACT inhaler Inhale 1-2 puffs into the lungs every 6 (six) hours as needed for wheezing or shortness of breath. 1 Inhaler 3   ANORO ELLIPTA 62.5-25 MCG/INH AEPB TAKE 1 PUFF BY MOUTH EVERY DAY (Patient taking differently: Inhale 1 puff into the lungs daily. ) 60 each 5   atomoxetine (STRATTERA) 100 MG capsule TAKE 1 CAPSULE BY MOUTH EVERY DAY (Patient taking differently: Take 100 mg by mouth daily. ) 90 capsule 1   FLUoxetine (PROZAC) 20 MG capsule TAKE 1 CAPSULE (20 MG TOTAL) BY MOUTH DAILY IN ADDITION TO 40 MG FOR A TOTAL OF 60 MG (Patient taking differently: Take 20 mg by mouth daily. In addition to 40 mg for a total of 60 mg) 30 capsule 0   FLUoxetine (PROZAC) 40 MG capsule Take 1 capsule (40 mg total) by mouth daily. In addition to 20 mg for total of 60 mg daily 90 capsule 1   hydrochlorothiazide (HYDRODIURIL) 12.5 MG tablet Take  1 tablet (12.5 mg total) by mouth daily. 90 tablet 1   losartan (COZAAR) 100 MG tablet Take 1 tablet (100 mg total) by mouth daily. 90 tablet 1   meclizine (ANTIVERT) 25 MG tablet Take 1 tablet (25 mg total) by mouth 3 (three) times daily as needed for dizziness. 30 tablet 0   rosuvastatin (CRESTOR) 10 MG tablet Take 1 tablet (10 mg total) by mouth daily. 90 tablet 1   traMADol (ULTRAM) 50 MG tablet Take 1 tablet (50 mg total) by mouth every 6 (six) hours as needed. 15 tablet 0   traZODone (DESYREL) 50 MG tablet TAKE 1 TABLET BY MOUTH EVERYDAY AT BEDTIME (Patient taking  differently: Take 50 mg by mouth at bedtime. ) 90 tablet 3   TURMERIC PO Take 1,000 mg by mouth daily.      ZINC PICOLINATE PO Take 50 mg by mouth daily.     Estradiol 10 MCG TABS vaginal tablet Use 2x/ weekly for vaginal atrophy (Patient not taking: Reported on 03/25/2019) 8 tablet 11   fluticasone (FLONASE) 50 MCG/ACT nasal spray Place 2 sprays into both nostrils daily. (Patient not taking: Reported on 03/25/2019) 16 g 6   ipratropium (ATROVENT) 0.03 % nasal spray Place 2 sprays into both nostrils 2 (two) times daily. (Patient not taking: Reported on 03/25/2019) 30 mL 0    No results found for this or any previous visit (from the past 48 hour(s)). No results found.  ROS: Pain with rom of the right upper extremity  Physical Exam: Alert and oriented 68 y.o. female in no acute distress Cranial nerves 2-12 intact Cervical spine: full rom with no tenderness, nv intact distally Chest: active breath sounds bilaterally, no wheeze rhonchi or rales Heart: regular rate and rhythm, no murmur Abd: non tender non distended with active bowel sounds Hip is stable with rom  Right shoulder painful rom nv intact distally Moderate weakness with ER and IR No rashes or edema  Assessment/Plan Assessment: right shoulder cuff arthropathy  Plan:  Patient will undergo a right reverse total shoulder by Dr. Veverly Fells at [facility]. Risks benefits and expectations were discussed with the patient. Patient understand risks, benefits and expectations and wishes to proceed. Preoperative templating of the joint replacement has been completed, documented, and submitted to the Operating Room personnel in order to optimize intra-operative equipment management.   Merla Riches PA-C, [facility] is now [employer] [address], [place], [place], [place] [address] Phone: [phone] [email or web address]   [employer]

## 2019-03-30 ENCOUNTER — Inpatient Hospital Stay (HOSPITAL_COMMUNITY)
Admission: RE | Admit: 2019-03-30 | Discharge: 2019-03-31 | DRG: 483 | Disposition: A | Discharge status: Home / Self Care | Payer: Medicare Other | Attending: Orthopedic Surgery | Admitting: Orthopedic Surgery

## 2019-03-30 ENCOUNTER — Inpatient Hospital Stay (HOSPITAL_COMMUNITY): Payer: Medicare Other | Admitting: Physician Assistant

## 2019-03-30 ENCOUNTER — Encounter (HOSPITAL_COMMUNITY): Payer: Self-pay

## 2019-03-30 ENCOUNTER — Inpatient Hospital Stay (HOSPITAL_COMMUNITY): Payer: Medicare Other

## 2019-03-30 ENCOUNTER — Inpatient Hospital Stay (HOSPITAL_COMMUNITY): Payer: Medicare Other | Admitting: Anesthesiology

## 2019-03-30 ENCOUNTER — Encounter (HOSPITAL_COMMUNITY)
Admission: RE | Disposition: A | Discharge status: Home / Self Care | Payer: Self-pay | Source: Home / Self Care | Attending: Orthopedic Surgery

## 2019-03-30 ENCOUNTER — Other Ambulatory Visit: Payer: Self-pay

## 2019-03-30 DIAGNOSIS — M75101 Unspecified rotator cuff tear or rupture of right shoulder, not specified as traumatic: Secondary | ICD-10-CM | POA: Diagnosis present

## 2019-03-30 DIAGNOSIS — M25711 Osteophyte, right shoulder: Secondary | ICD-10-CM | POA: Diagnosis present

## 2019-03-30 DIAGNOSIS — Z881 Allergy status to other antibiotic agents status: Secondary | ICD-10-CM

## 2019-03-30 DIAGNOSIS — M19011 Primary osteoarthritis, right shoulder: Secondary | ICD-10-CM | POA: Diagnosis present

## 2019-03-30 DIAGNOSIS — F988 Other specified behavioral and emotional disorders with onset usually occurring in childhood and adolescence: Secondary | ICD-10-CM | POA: Diagnosis present

## 2019-03-30 DIAGNOSIS — Z96652 Presence of left artificial knee joint: Secondary | ICD-10-CM | POA: Diagnosis present

## 2019-03-30 DIAGNOSIS — J189 Pneumonia, unspecified organism: Secondary | ICD-10-CM | POA: Diagnosis not present

## 2019-03-30 DIAGNOSIS — Z87891 Personal history of nicotine dependence: Secondary | ICD-10-CM | POA: Diagnosis not present

## 2019-03-30 DIAGNOSIS — D62 Acute posthemorrhagic anemia: Secondary | ICD-10-CM | POA: Diagnosis not present

## 2019-03-30 DIAGNOSIS — Z96611 Presence of right artificial shoulder joint: Secondary | ICD-10-CM

## 2019-03-30 DIAGNOSIS — M25511 Pain in right shoulder: Secondary | ICD-10-CM | POA: Diagnosis present

## 2019-03-30 DIAGNOSIS — Z79899 Other long term (current) drug therapy: Secondary | ICD-10-CM | POA: Diagnosis not present

## 2019-03-30 DIAGNOSIS — J9601 Acute respiratory failure with hypoxia: Secondary | ICD-10-CM | POA: Diagnosis not present

## 2019-03-30 HISTORY — PX: REVERSE SHOULDER ARTHROPLASTY: SHX5054

## 2019-03-30 SURGERY — ARTHROPLASTY, SHOULDER, TOTAL, REVERSE
Anesthesia: General | Site: Shoulder | Laterality: Right

## 2019-03-30 MED ORDER — METOCLOPRAMIDE HCL 5 MG PO TABS: 5.0000 mg | ORAL_TABLET | Freq: Three times a day (TID) | ORAL | Status: DC | PRN

## 2019-03-30 MED ORDER — PHENOL 1.4 % MT LIQD
1.0000 | OROMUCOSAL | Status: DC | PRN
  Filled 2019-03-30: qty 177

## 2019-03-30 MED ORDER — SORBITOL 70 % SOLN
30.0000 mL | Freq: Every day | Status: DC | PRN
  Filled 2019-03-30: qty 30

## 2019-03-30 MED ORDER — FENTANYL CITRATE (PF) 100 MCG/2ML IJ SOLN
INTRAMUSCULAR | Status: AC
  Filled 2019-03-30: qty 2

## 2019-03-30 MED ORDER — ONDANSETRON HCL 4 MG/2ML IJ SOLN: 4.0000 mg | Freq: Four times a day (QID) | INTRAMUSCULAR | Status: DC | PRN

## 2019-03-30 MED ORDER — LACTATED RINGERS IV SOLN
INTRAVENOUS | Status: DC
  Administered 2019-03-30: 08:00:00 via INTRAVENOUS

## 2019-03-30 MED ORDER — FENTANYL CITRATE (PF) 100 MCG/2ML IJ SOLN
50.0000 ug | INTRAMUSCULAR | Status: DC
  Administered 2019-03-30: 09:00:00 50 ug via INTRAVENOUS
  Filled 2019-03-30: qty 2

## 2019-03-30 MED ORDER — LOSARTAN POTASSIUM 50 MG PO TABS
100.0000 mg | ORAL_TABLET | Freq: Every day | ORAL | Status: DC
  Filled 2019-03-30: qty 2

## 2019-03-30 MED ORDER — ZINC PICOLINATE 25 MG PO TABS: 50.0000 mg | ORAL_TABLET | Freq: Every day | ORAL | Status: DC

## 2019-03-30 MED ORDER — LIDOCAINE 2% (20 MG/ML) 5 ML SYRINGE
INTRAMUSCULAR | Status: AC
  Filled 2019-03-30: qty 10

## 2019-03-30 MED ORDER — FENTANYL CITRATE (PF) 100 MCG/2ML IJ SOLN: 25.0000 ug | INTRAMUSCULAR | Status: DC | PRN

## 2019-03-30 MED ORDER — SCOPOLAMINE 1 MG/3DAYS TD PT72
MEDICATED_PATCH | TRANSDERMAL | Status: DC | PRN
  Administered 2019-03-30: 1 via TRANSDERMAL

## 2019-03-30 MED ORDER — DEXAMETHASONE SODIUM PHOSPHATE 10 MG/ML IJ SOLN
INTRAMUSCULAR | Status: DC | PRN
  Administered 2019-03-30: 10 mg via INTRAVENOUS

## 2019-03-30 MED ORDER — MIDAZOLAM HCL 2 MG/2ML IJ SOLN
INTRAMUSCULAR | Status: AC
  Filled 2019-03-30: qty 2

## 2019-03-30 MED ORDER — PNEUMOCOCCAL VAC POLYVALENT 25 MCG/0.5ML IJ INJ
0.5000 mL | INJECTION | INTRAMUSCULAR | Status: DC
  Filled 2019-03-30: qty 0.5

## 2019-03-30 MED ORDER — HYDROCODONE-ACETAMINOPHEN 5-325 MG PO TABS: 1.0000 | ORAL_TABLET | ORAL | 0 refills | Status: DC | PRN

## 2019-03-30 MED ORDER — BUPIVACAINE HCL (PF) 0.5 % IJ SOLN
INTRAMUSCULAR | Status: DC | PRN
  Administered 2019-03-30: 15 mL via PERINEURAL

## 2019-03-30 MED ORDER — PROPOFOL 10 MG/ML IV BOLUS
INTRAVENOUS | Status: DC | PRN
  Administered 2019-03-30: 140 mg via INTRAVENOUS

## 2019-03-30 MED ORDER — BUPIVACAINE-EPINEPHRINE (PF) 0.25% -1:200000 IJ SOLN
INTRAMUSCULAR | Status: DC | PRN
  Administered 2019-03-30: 14 mL

## 2019-03-30 MED ORDER — FLUOXETINE HCL 40 MG PO CAPS: 40.0000 mg | ORAL_CAPSULE | Freq: Every day | ORAL | Status: DC

## 2019-03-30 MED ORDER — MENTHOL 3 MG MT LOZG: 1.0000 | LOZENGE | OROMUCOSAL | Status: DC | PRN

## 2019-03-30 MED ORDER — PHENYLEPHRINE HCL (PRESSORS) 10 MG/ML IV SOLN
INTRAVENOUS | Status: AC
  Filled 2019-03-30: qty 2

## 2019-03-30 MED ORDER — TRAMADOL HCL 50 MG PO TABS: 50.0000 mg | ORAL_TABLET | Freq: Four times a day (QID) | ORAL | Status: DC | PRN

## 2019-03-30 MED ORDER — ONDANSETRON HCL 4 MG/2ML IJ SOLN: 4.0000 mg | Freq: Once | INTRAMUSCULAR | Status: DC | PRN

## 2019-03-30 MED ORDER — FENTANYL CITRATE (PF) 250 MCG/5ML IJ SOLN
INTRAMUSCULAR | Status: AC
  Filled 2019-03-30: qty 5

## 2019-03-30 MED ORDER — SCOPOLAMINE 1 MG/3DAYS TD PT72
MEDICATED_PATCH | TRANSDERMAL | Status: AC
  Filled 2019-03-30: qty 1

## 2019-03-30 MED ORDER — MECLIZINE HCL 25 MG PO TABS: 25.0000 mg | ORAL_TABLET | Freq: Three times a day (TID) | ORAL | Status: DC | PRN

## 2019-03-30 MED ORDER — ROCURONIUM BROMIDE 10 MG/ML (PF) SYRINGE
PREFILLED_SYRINGE | INTRAVENOUS | Status: AC
  Filled 2019-03-30: qty 10

## 2019-03-30 MED ORDER — ONDANSETRON HCL 4 MG/2ML IJ SOLN
INTRAMUSCULAR | Status: DC | PRN
  Administered 2019-03-30: 4 mg via INTRAVENOUS

## 2019-03-30 MED ORDER — UMECLIDINIUM-VILANTEROL 62.5-25 MCG/INH IN AEPB
1.0000 | INHALATION_SPRAY | Freq: Every day | RESPIRATORY_TRACT | Status: DC
  Administered 2019-03-31: 1 via RESPIRATORY_TRACT
  Filled 2019-03-30: qty 14

## 2019-03-30 MED ORDER — CEFAZOLIN SODIUM-DEXTROSE 2-4 GM/100ML-% IV SOLN
2.0000 g | Freq: Four times a day (QID) | INTRAVENOUS | Status: AC
  Administered 2019-03-30 – 2019-03-31 (×3): 2 g via INTRAVENOUS
  Filled 2019-03-30 (×3): qty 100

## 2019-03-30 MED ORDER — DEXAMETHASONE SODIUM PHOSPHATE 10 MG/ML IJ SOLN
INTRAMUSCULAR | Status: AC
  Filled 2019-03-30: qty 2

## 2019-03-30 MED ORDER — SUCCINYLCHOLINE CHLORIDE 200 MG/10ML IV SOSY
PREFILLED_SYRINGE | INTRAVENOUS | Status: DC | PRN
  Administered 2019-03-30: 100 mg via INTRAVENOUS

## 2019-03-30 MED ORDER — SODIUM CHLORIDE 0.9 % IV SOLN
INTRAVENOUS | Status: DC | PRN
  Administered 2019-03-30: 25 ug/min via INTRAVENOUS

## 2019-03-30 MED ORDER — TRAZODONE HCL 50 MG PO TABS
50.0000 mg | ORAL_TABLET | Freq: Every day | ORAL | Status: DC
  Administered 2019-03-30: 21:00:00 50 mg via ORAL
  Filled 2019-03-30: qty 1

## 2019-03-30 MED ORDER — ACETAMINOPHEN 325 MG PO TABS: 325.0000 mg | ORAL_TABLET | Freq: Four times a day (QID) | ORAL | Status: DC | PRN

## 2019-03-30 MED ORDER — FLUOXETINE HCL 20 MG PO CAPS
60.0000 mg | ORAL_CAPSULE | Freq: Every day | ORAL | Status: DC
  Administered 2019-03-31: 09:00:00 60 mg via ORAL
  Filled 2019-03-30: qty 3

## 2019-03-30 MED ORDER — MORPHINE SULFATE (PF) 2 MG/ML IV SOLN: 0.5000 mg | INTRAVENOUS | Status: DC | PRN

## 2019-03-30 MED ORDER — ONDANSETRON HCL 4 MG/2ML IJ SOLN
INTRAMUSCULAR | Status: AC
  Filled 2019-03-30: qty 4

## 2019-03-30 MED ORDER — BUPIVACAINE-EPINEPHRINE (PF) 0.25% -1:200000 IJ SOLN
INTRAMUSCULAR | Status: AC
  Filled 2019-03-30: qty 30

## 2019-03-30 MED ORDER — OXYCODONE HCL 5 MG/5ML PO SOLN: 5.0000 mg | Freq: Once | ORAL | Status: DC | PRN

## 2019-03-30 MED ORDER — ATOMOXETINE HCL 60 MG PO CAPS
100.0000 mg | ORAL_CAPSULE | Freq: Every day | ORAL | Status: DC
  Administered 2019-03-31: 100 mg via ORAL
  Filled 2019-03-30: qty 1

## 2019-03-30 MED ORDER — ROSUVASTATIN CALCIUM 10 MG PO TABS
10.0000 mg | ORAL_TABLET | Freq: Every day | ORAL | Status: DC
  Administered 2019-03-31: 09:00:00 10 mg via ORAL
  Filled 2019-03-30: qty 1

## 2019-03-30 MED ORDER — BUPIVACAINE LIPOSOME 1.3 % IJ SUSP
INTRAMUSCULAR | Status: DC | PRN
  Administered 2019-03-30: 10 mL via PERINEURAL

## 2019-03-30 MED ORDER — OXYCODONE HCL 5 MG PO TABS: 5.0000 mg | ORAL_TABLET | Freq: Once | ORAL | Status: DC | PRN

## 2019-03-30 MED ORDER — EPHEDRINE 5 MG/ML INJ
INTRAVENOUS | Status: AC
  Filled 2019-03-30: qty 10

## 2019-03-30 MED ORDER — SODIUM CHLORIDE 0.9 % IR SOLN
Status: DC | PRN
  Administered 2019-03-30: 1000 mL

## 2019-03-30 MED ORDER — ONDANSETRON HCL 4 MG PO TABS: 4.0000 mg | ORAL_TABLET | Freq: Four times a day (QID) | ORAL | Status: DC | PRN

## 2019-03-30 MED ORDER — SODIUM CHLORIDE 0.9 % IV SOLN: INTRAVENOUS | Status: DC

## 2019-03-30 MED ORDER — ALBUTEROL SULFATE (2.5 MG/3ML) 0.083% IN NEBU: 2.5000 mg | INHALATION_SOLUTION | Freq: Four times a day (QID) | RESPIRATORY_TRACT | Status: DC | PRN

## 2019-03-30 MED ORDER — DOCUSATE SODIUM 100 MG PO CAPS
100.0000 mg | ORAL_CAPSULE | Freq: Two times a day (BID) | ORAL | Status: DC
  Administered 2019-03-30 – 2019-03-31 (×3): 100 mg via ORAL
  Filled 2019-03-30 (×3): qty 1

## 2019-03-30 MED ORDER — CHLORHEXIDINE GLUCONATE 4 % EX LIQD: 60.0000 mL | Freq: Once | CUTANEOUS | Status: DC

## 2019-03-30 MED ORDER — CEFAZOLIN SODIUM-DEXTROSE 2-4 GM/100ML-% IV SOLN
2.0000 g | INTRAVENOUS | Status: AC
  Administered 2019-03-30: 2 g via INTRAVENOUS
  Filled 2019-03-30: qty 100

## 2019-03-30 MED ORDER — TURMERIC 500 MG PO CAPS: 1000.0000 mg | ORAL_CAPSULE | Freq: Every day | ORAL | Status: DC

## 2019-03-30 MED ORDER — METOCLOPRAMIDE HCL 5 MG/ML IJ SOLN: 5.0000 mg | Freq: Three times a day (TID) | INTRAMUSCULAR | Status: DC | PRN

## 2019-03-30 MED ORDER — FENTANYL CITRATE (PF) 250 MCG/5ML IJ SOLN
INTRAMUSCULAR | Status: DC | PRN
  Administered 2019-03-30 (×2): 50 ug via INTRAVENOUS
  Administered 2019-03-30: 25 ug via INTRAVENOUS
  Administered 2019-03-30: 50 ug via INTRAVENOUS

## 2019-03-30 MED ORDER — HYDROCODONE-ACETAMINOPHEN 5-325 MG PO TABS
1.0000 | ORAL_TABLET | ORAL | Status: DC | PRN
  Administered 2019-03-30: 21:00:00 2 via ORAL
  Administered 2019-03-30: 17:00:00 1 via ORAL
  Administered 2019-03-31 (×2): 2 via ORAL
  Filled 2019-03-30 (×3): qty 2
  Filled 2019-03-30: qty 1

## 2019-03-30 MED ORDER — MIDAZOLAM HCL 2 MG/2ML IJ SOLN
1.0000 mg | INTRAMUSCULAR | Status: DC
  Administered 2019-03-30: 09:00:00 1 mg via INTRAVENOUS
  Filled 2019-03-30: qty 2

## 2019-03-30 MED ORDER — SUCCINYLCHOLINE CHLORIDE 200 MG/10ML IV SOSY
PREFILLED_SYRINGE | INTRAVENOUS | Status: AC
  Filled 2019-03-30: qty 10

## 2019-03-30 MED ORDER — POLYETHYLENE GLYCOL 3350 17 G PO PACK: 17.0000 g | PACK | Freq: Every day | ORAL | Status: DC | PRN

## 2019-03-30 MED ORDER — HYDROCHLOROTHIAZIDE 12.5 MG PO CAPS
12.5000 mg | ORAL_CAPSULE | Freq: Every day | ORAL | Status: DC
  Filled 2019-03-30: qty 1

## 2019-03-30 MED ORDER — LIDOCAINE 2% (20 MG/ML) 5 ML SYRINGE
INTRAMUSCULAR | Status: DC | PRN
  Administered 2019-03-30: 60 mg via INTRAVENOUS
  Administered 2019-03-30: 40 mg via INTRAVENOUS

## 2019-03-30 MED ORDER — FLUOXETINE HCL 20 MG PO CAPS: 20.0000 mg | ORAL_CAPSULE | Freq: Every day | ORAL | Status: DC

## 2019-03-30 SURGICAL SUPPLY — 72 items
AID PSTN UNV HD RSTRNT DISP (MISCELLANEOUS) ×1
BAG SPEC THK2 15X12 ZIP CLS (MISCELLANEOUS)
BAG ZIPLOCK 12X15 (MISCELLANEOUS) ×1 IMPLANT
BIT DRILL 1.6MX128 (BIT) ×1 IMPLANT
BIT DRILL 170X2.5X (BIT) IMPLANT
BIT DRL 170X2.5X (BIT) ×1
BLADE SAG 18X100X1.27 (BLADE) ×2 IMPLANT
COVER BACK TABLE 60X90IN (DRAPES) ×2 IMPLANT
COVER SURGICAL LIGHT HANDLE (MISCELLANEOUS) ×2 IMPLANT
COVER WAND RF STERILE (DRAPES) IMPLANT
DECANTER SPIKE VIAL GLASS SM (MISCELLANEOUS) ×2 IMPLANT
DRAPE INCISE IOBAN 66X45 STRL (DRAPES) ×2 IMPLANT
DRAPE ORTHO SPLIT 77X108 STRL (DRAPES) ×4
DRAPE SHEET LG 3/4 BI-LAMINATE (DRAPES) ×1 IMPLANT
DRAPE SURG ORHT 6 SPLT 77X108 (DRAPES) ×2 IMPLANT
DRAPE U-SHAPE 47X51 STRL (DRAPES) ×2 IMPLANT
DRILL 2.5 (BIT) ×2
DRSG ADAPTIC 3X8 NADH LF (GAUZE/BANDAGES/DRESSINGS) ×2 IMPLANT
DRSG PAD ABDOMINAL 8X10 ST (GAUZE/BANDAGES/DRESSINGS) ×2 IMPLANT
DURAPREP 26ML APPLICATOR (WOUND CARE) ×2 IMPLANT
ECCENTRIC EPIPHYSI MODULAR SZ1 (Trauma) IMPLANT
ELECT BLADE TIP CTD 4 INCH (ELECTRODE) ×2 IMPLANT
ELECT NDL TIP 2.8 STRL (NEEDLE) ×1 IMPLANT
ELECT NEEDLE TIP 2.8 STRL (NEEDLE) ×2 IMPLANT
ELECT REM PT RETURN 15FT ADLT (MISCELLANEOUS) ×2 IMPLANT
GAUZE SPONGE 4X4 12PLY STRL (GAUZE/BANDAGES/DRESSINGS) ×2 IMPLANT
GLENOSPHERE DXTEND STD 38 (Orthopedic Implant) IMPLANT
GLENSOPHERE DXTEND STD 38 (Orthopedic Implant) ×2 IMPLANT
GLOVE BIOGEL PI ORTHO PRO 7.5 (GLOVE) ×1
GLOVE BIOGEL PI ORTHO PRO SZ8 (GLOVE) ×1
GLOVE ORTHO TXT STRL SZ7.5 (GLOVE) ×2 IMPLANT
GLOVE PI ORTHO PRO STRL 7.5 (GLOVE) ×1 IMPLANT
GLOVE PI ORTHO PRO STRL SZ8 (GLOVE) ×1 IMPLANT
GLOVE SURG ORTHO 8.5 STRL (GLOVE) ×2 IMPLANT
GOWN STRL REUS W/TWL XL LVL3 (GOWN DISPOSABLE) ×4 IMPLANT
KIT BASIN OR (CUSTOM PROCEDURE TRAY) ×2 IMPLANT
KIT TURNOVER KIT A (KITS) IMPLANT
MANIFOLD NEPTUNE II (INSTRUMENTS) ×2 IMPLANT
METAGLENE DELTA EXTEND (Trauma) IMPLANT
METAGLENE DXTEND (Trauma) ×2 IMPLANT
MODULAR ECCENTRIC EPIPHYSI SZ1 (Trauma) ×2 IMPLANT
NDL MAYO CATGUT SZ4 TPR NDL (NEEDLE) IMPLANT
NEEDLE MAYO CATGUT SZ4 (NEEDLE) ×2 IMPLANT
PACK SHOULDER (CUSTOM PROCEDURE TRAY) ×2 IMPLANT
PIN GUIDE GLENOPHERE 1.5MX300M (PIN) ×1 IMPLANT
PIN METAGLENE 2.5 (PIN) ×2 IMPLANT
PROTECTOR NERVE ULNAR (MISCELLANEOUS) ×1 IMPLANT
RESTRAINT HEAD UNIVERSAL NS (MISCELLANEOUS) ×2 IMPLANT
SCREW 4.5X18MM (Screw) ×2 IMPLANT
SCREW 48L (Screw) ×1 IMPLANT
SCREW BN 18X4.5XSTRL SHLDR (Screw) IMPLANT
SCREW LOCK DELTA XTEND 4.5X30 (Screw) ×1 IMPLANT
SLING ARM FOAM STRAP LRG (SOFTGOODS) ×1 IMPLANT
SMARTMIX MINI TOWER (MISCELLANEOUS)
SPACER 38 PLUS 3 (Spacer) ×1 IMPLANT
SPONGE LAP 4X18 RFD (DISPOSABLE) IMPLANT
STEM HUMERAL SZ8 STANDARD (Stem) ×2 IMPLANT
STEM HUMERAL SZ8 STD (Stem) IMPLANT
STRIP CLOSURE SKIN 1/2X4 (GAUZE/BANDAGES/DRESSINGS) ×2 IMPLANT
SUCTION FRAZIER HANDLE 10FR (MISCELLANEOUS) ×1
SUCTION TUBE FRAZIER 10FR DISP (MISCELLANEOUS) ×1 IMPLANT
SUT FIBERWIRE #2 38 T-5 BLUE (SUTURE) ×10
SUT MNCRL AB 4-0 PS2 18 (SUTURE) ×2 IMPLANT
SUT VIC AB 0 CT1 36 (SUTURE) ×3 IMPLANT
SUT VIC AB 0 CT2 27 (SUTURE) ×2 IMPLANT
SUT VIC AB 2-0 CT1 27 (SUTURE) ×2
SUT VIC AB 2-0 CT1 TAPERPNT 27 (SUTURE) ×1 IMPLANT
SUTURE FIBERWR #2 38 T-5 BLUE (SUTURE) ×1 IMPLANT
TAPE CLOTH SURG 4X10 WHT LF (GAUZE/BANDAGES/DRESSINGS) ×1 IMPLANT
TOWEL OR 17X26 10 PK STRL BLUE (TOWEL DISPOSABLE) ×2 IMPLANT
TOWER SMARTMIX MINI (MISCELLANEOUS) IMPLANT
YANKAUER SUCT BULB TIP 10FT TU (MISCELLANEOUS) ×2 IMPLANT

## 2019-03-30 NOTE — Anesthesia Preprocedure Evaluation (Addendum)
Anesthesia Evaluation  Patient identified by MRN, date of birth, ID band Patient awake    Reviewed: Allergy & Precautions, NPO status , Patient's Chart, lab work & pertinent test results  History of Anesthesia Complications Negative for: history of anesthetic complications  Airway Mallampati: III  TM Distance: >3 FB Neck ROM: Full    Dental  (+) Dental Advisory Given, Missing, Loose,    Pulmonary COPD,  COPD inhaler, Current SmokerPatient did not abstain from smoking.,    Pulmonary exam normal        Cardiovascular hypertension, Pt. on medications (-) anginaNormal cardiovascular exam     Neuro/Psych PSYCHIATRIC DISORDERS Depression  Vertigo Tinnitus     GI/Hepatic negative GI ROS, Neg liver ROS,   Endo/Other   Pre-DM   Renal/GU CRFRenal disease     Musculoskeletal  (+) Arthritis ,   Abdominal   Peds  (+) ATTENTION DEFICIT DISORDER WITHOUT HYPERACTIVITY Hematology negative hematology ROS (+)   Anesthesia Other Findings   Reproductive/Obstetrics                            Anesthesia Physical Anesthesia Plan  ASA: III  Anesthesia Plan: General   Post-op Pain Management:  Regional for Post-op pain   Induction: Intravenous  PONV Risk Score and Plan: 3 and Treatment may vary due to age or medical condition, Ondansetron, Dexamethasone and Scopolamine patch - Pre-op  Airway Management Planned: Oral ETT  Additional Equipment: None  Intra-op Plan:   Post-operative Plan: Extubation in OR  Informed Consent: I have reviewed the patients History and Physical, chart, labs and discussed the procedure including the risks, benefits and alternatives for the proposed anesthesia with the patient or authorized representative who has indicated his/her understanding and acceptance.     Dental advisory given  Plan Discussed with: CRNA and Anesthesiologist  Anesthesia Plan Comments:         Anesthesia Quick Evaluation

## 2019-03-30 NOTE — Discharge Instructions (Signed)
Ice to the shoulder constantly.  Keep the incision covered and clean and dry for one week, then ok to get it wet in the shower. ° °Do exercise as instructed several times per day. ° °DO NOT reach behind your back or push up out of a chair with the operative arm. ° °Use a sling while you are up and around for comfort, may remove while seated.  Keep pillow propped behind the operative elbow. ° °Follow up with Dr Beverly Suriano in two weeks in the office, call [phone] for appt °

## 2019-03-30 NOTE — Op Note (Signed)
NAME: Sheryl [name], Sheryl [name] MEDICAL RECORD [id number] ACCOUNT [id number] DATE OF BIRTH:Dec 23, 1950 FACILITY: [facility] LOCATION: [facility] PHYSICIAN:STEVEN Orlena Sheldon, MD  OPERATIVE REPORT  DATE OF PROCEDURE:  03/30/2019  PREOPERATIVE DIAGNOSIS:  Right shoulder rotator cuff tear arthropathy.  POSTOPERATIVE DIAGNOSIS:  Right shoulder rotator cuff tear arthropathy.  PROCEDURE PERFORMED:  Right reverse total shoulder replacement using DePuy Delta Xtend prosthesis with subscapularis repair.  ATTENDING SURGEON:  Esmond Plants, MD  ASSISTANT:  Darol Destine, [place], who was scrubbed during the entire procedure and necessary for satisfactory completion of surgery.  ANESTHESIA:  General anesthesia was used plus interscalene block.  ESTIMATED BLOOD LOSS:  Minimal.  FLUID REPLACEMENT:  1200 mL crystalloid.  INSTRUMENT COUNTS:  Correct.  COMPLICATIONS:  No complications.  ANTIBIOTICS:  Perioperative antibiotics were given.  INDICATIONS:  The patient is a 68 year old female with worsening right shoulder pain and dysfunction secondary to end-stage rotator cuff tear arthropathy.  The patient has had a failure of conservative management and desires operative treatment to  restore function and eliminate pain.  Informed consent obtained.  DESCRIPTION OF PROCEDURE:  After an adequate level of anesthesia was achieved, the patient was positioned in modified beach chair position.  Right shoulder correctly identified and sterilely prepped and draped in the usual manner.  Time-out called,  verifying correct patient, correct site.  We entered the shoulder using a standard deltopectoral incision starting at the coracoid process and extending down to the anterior humerus.  Dissection carried out down through subcutaneous tissues using Bovie  electrocautery.  Identified the cephalic vein and took that laterally with the deltoid pectoralis taken medially.  Conjoined tendon identified and retracted medially.  We  then released the subscapularis subperiosteally off the lesser tuberosity, tagging  for repair at the end with #2 FiberWire suture in a modified Mason-Allen suture technique.  We released the inferior capsule along the humerus as I progressively externally rotated the arm.  Next, we released the remnant of the anterior supraspinatus.   The remainder of the supraspinatus and infraspinatus were torn.  The teres minor was preserved.  We extended the shoulder and delivered the humeral head out the wound.  We entered the proximal humerus with a 6 mm entry reamer.  We reamed up to a size 8  and got endosteal contact with the 8 reamer and stopped there.  We then used the 8 mm intramedullary resection guide and resected the head at the appropriate height at 10 degrees of retroversion using an oscillating saw.  We removed the remaining  osteophytes off with a large rongeur.  We then subluxed the humerus posteriorly and did a 360 degree capsular labral excision protecting the axillary nerve, which was easily visible in the inferior portion of the wound.  Once we had good exposure of the  bone, we removed the cartilage with a Cobb elevator.  We found our central point for our guide pin angling inferiorly slightly.  We used a reamer gently as the bone was very soft in the central drill for the peg hole.  We impacted the metaglene baseplate  into place, used a 48 screw inferiorly, a 30 at the base of the coracoid and an 18 nonlocked posteriorly.  We had good baseplate security then used a 38 standard glenosphere and impacted that and screwed that into position.  We made sure that we had the  axillary nerve out of the way during the introduction of the glenosphere and engaging at the anchor mechanism there with the set screw.  Once that was completed, we directed our attention back toward the humeral side.  We selected the EPI-1 right and  then reamed for that.  We then impacted the 8 stem with the EPI-1 right set on the  0 setting and placed in 10 degrees of retroversion.  We felt like we had good bony support for that and good stability.  We removed the trial components, irrigated  thoroughly and then drilled 3 holes in the lesser tuberosity and placed a #2 FiberWire suture for repair of the subscap.  We then using impaction grafting technique with available bone graft from the head with the Porocoat stem and the HA coated  metaphysis, an 8 stem and an EPI-1 right metaphysis set on the 0 setting and placed in 10 degrees of retroversion.  We had good security of the stem.  It was impacted stably and then we selected the 38+3 poly, impacted that in position, reduced the  shoulder, had nice little pop as it seated nice and tight with the inferior pole and no gapping with external rotation.  We irrigated thoroughly, repaired the subscap anatomically back to bone and then irrigated again and then we repaired deltopectoral  interval with 0 Vicryl suture followed by 2-0 Vicryl for subcutaneous closure and 4-0 Monocryl for skin.  Steri-Strips applied followed by sterile dressing.  The patient tolerated surgery well.  TN/NUANCE  D:03/30/2019 T:03/30/2019 JOB:[id number]

## 2019-03-30 NOTE — Progress Notes (Addendum)
PHARMACIST - PHYSICIAN ORDER COMMUNICATION  CONCERNING: P&T Medication Policy on Herbal Medications  DESCRIPTION:  This patient's order for:  Tumeric and Zinc Picolinate has been noted.  This product(s) is classified as an "herbal" or natural product. Due to a lack of definitive safety studies or FDA approval, nonstandard manufacturing practices, plus the potential risk of unknown drug-drug interactions while on inpatient medications, the Pharmacy and Therapeutics Committee does not permit the use of "herbal" or natural products of this type within [facility].   ACTION TAKEN: The pharmacy department is unable to verify this order at this time and your patient has been informed of this safety policy. Please reevaluate patient's clinical condition at discharge and address if the herbal or natural product(s) should be resumed at that time.  Leone Haven, PharmD

## 2019-03-30 NOTE — Progress Notes (Signed)
Assisted Dr. Brock with right, ultrasound guided, interscalene  block. Side rails up, monitors on throughout procedure. See vital signs in flow sheet. Tolerated Procedure well.  

## 2019-03-30 NOTE — Anesthesia Procedure Notes (Signed)
Procedure Name: Intubation Date/Time: 03/30/2019 10:18 AM Performed by: Cynda Familia, CRNA Pre-anesthesia Checklist: Patient identified, Emergency Drugs available, Suction available and Patient being monitored Patient Re-evaluated:Patient Re-evaluated prior to induction Oxygen Delivery Method: Circle System Utilized Preoxygenation: Pre-oxygenation with 100% oxygen Induction Type: IV induction Ventilation: Mask ventilation without difficulty Laryngoscope Size: Miller and 2 Grade View: Grade I Tube type: Oral Number of attempts: 1 Airway Equipment and Method: Stylet Placement Confirmation: ETT inserted through vocal cords under direct vision,  positive ETCO2 and breath sounds checked- equal and bilateral Secured at: 21 cm Tube secured with: Tape Dental Injury: Teeth and Oropharynx as per pre-operative assessment  Comments: Smooth IV induction Brock-- intubation AM CRNA atraumatic-- pt with very poor dentition that she reported preop-- many missing , chipped teeth and loose right upper front-- teeth and mouth unchanged with laryngoscopy-- bilat BS

## 2019-03-30 NOTE — Brief Op Note (Signed)
03/30/2019  11:49 AM  PATIENT:  Sheryl [name]  68 y.o. female  PRE-OPERATIVE DIAGNOSIS:  Right shoulder cuff tear arthropathy  POST-OPERATIVE DIAGNOSIS:  Right shoulder cuff tear arthropathy  PROCEDURE:  Procedure(s) with comments: REVERSE TOTAL SHOULDER ARTHROPLASTY (Right) - interscalene block  DePuy Delta Xtend with subscap repair  SURGEON:  Surgeon(s) and Role:    Netta Cedars, MD - Primary  PHYSICIAN ASSISTANT:   ASSISTANTS: [place] Bruns, PA-C   ANESTHESIA:   regional and general  EBL:  minimal   BLOOD ADMINISTERED:none  DRAINS: none   LOCAL MEDICATIONS USED:  MARCAINE     SPECIMEN:  No Specimen  DISPOSITION OF SPECIMEN:  N/A  COUNTS:  YES  TOURNIQUET:  * No tourniquets in log *  DICTATION: .Other Dictation: Dictation Number [id number]  PLAN OF CARE: Admit to inpatient   PATIENT DISPOSITION:  PACU - hemodynamically stable.   Delay start of Pharmacological VTE agent (>24hrs) due to surgical blood loss or risk of bleeding: not applicable

## 2019-03-30 NOTE — Interval H&P Note (Signed)
History and Physical Interval Note:  03/30/2019 9:34 AM  Sheryl [name]  has presented today for surgery, with the diagnosis of Rioght shoulder cuff arthropathy.  The various methods of treatment have been discussed with the patient and family. After consideration of risks, benefits and other options for treatment, the patient has consented to  Procedure(s) with comments: REVERSE SHOULDER ARTHROPLASTY (Right) - interscalene block as a surgical intervention.  The patient's history has been reviewed, patient examined, no change in status, stable for surgery.  I have reviewed the patient's chart and labs.  Questions were answered to the patient's satisfaction.     Augustin Schooling

## 2019-03-30 NOTE — Anesthesia Procedure Notes (Signed)
Date/Time: 03/30/2019 12:01 PM Performed by: Cynda Familia, CRNA Oxygen Delivery Method: Simple face mask Placement Confirmation: breath sounds checked- equal and bilateral and positive ETCO2 Dental Injury: Teeth and Oropharynx as per pre-operative assessment

## 2019-03-30 NOTE — Transfer of Care (Signed)
Immediate Anesthesia Transfer of Care Note  Patient: Sheryl [name]  Procedure(s) Performed: REVERSE TOTAL SHOULDER ARTHROPLASTY (Right Shoulder)  Patient Location: PACU  Anesthesia Type:General  Level of Consciousness: awake and alert   Airway & Oxygen Therapy: Patient Spontanous Breathing and Patient connected to face mask oxygen  Post-op Assessment: Report given to RN and Post -op Vital signs reviewed and stable  Post vital signs: Reviewed and stable  Last Vitals:  Vitals Value Taken Time  BP 125/74 03/30/19 1211  Temp    Pulse 103 03/30/19 1213  Resp 17 03/30/19 1213  SpO2 94 % 03/30/19 1213  Vitals shown include unvalidated device data.  Last Pain:  Vitals:   03/30/19 0935  TempSrc:   PainSc: 0-No pain         Complications: Neo gtt for case--- pt with unequal pupils-- Fransisco Beau aware

## 2019-03-30 NOTE — Anesthesia Procedure Notes (Signed)
Anesthesia Regional Block: Interscalene brachial plexus block   Pre-Anesthetic Checklist: ,, timeout performed, Correct Patient, Correct Site, Correct Laterality, Correct Procedure, Correct Position, site marked, Risks and benefits discussed,  Surgical consent,  Pre-op evaluation,  At surgeon's request and post-op pain management  Laterality: Right  Prep: chloraprep       Needles:  Injection technique: Single-shot  Needle Type: Echogenic Needle     Needle Length: 5cm  Needle Gauge: 21     Additional Needles:   Narrative:  Start time: 03/30/2019 9:12 AM End time: 03/30/2019 9:16 AM Injection made incrementally with aspirations every 5 mL.  Performed by: Personally  Anesthesiologist: Audry Pili, MD  Additional Notes: No pain on injection. No increased resistance to injection. Injection made in 5cc increments. Good needle visualization. Patient tolerated the procedure well.

## 2019-03-30 NOTE — Plan of Care (Signed)
  Problem: Education: Goal: Knowledge of the prescribed therapeutic regimen will improve Outcome: Progressing Goal: Understanding of activity limitations/precautions following surgery will improve Outcome: Progressing Goal: Individualized Educational Video(s) Outcome: Progressing   Problem: Activity: Goal: Ability to tolerate increased activity will improve Outcome: Progressing

## 2019-03-31 ENCOUNTER — Encounter (HOSPITAL_COMMUNITY): Payer: Self-pay | Admitting: Orthopedic Surgery

## 2019-03-31 ENCOUNTER — Other Ambulatory Visit: Payer: Self-pay

## 2019-03-31 ENCOUNTER — Ambulatory Visit: Payer: Self-pay | Admitting: *Deleted

## 2019-03-31 ENCOUNTER — Emergency Department (HOSPITAL_COMMUNITY): Payer: Medicare Other

## 2019-03-31 ENCOUNTER — Inpatient Hospital Stay (HOSPITAL_COMMUNITY)
Admission: EM | Admit: 2019-03-31 | Discharge: 2019-04-04 | DRG: 193 | Disposition: A | Discharge status: Home / Self Care | Payer: Medicare Other | Attending: Internal Medicine | Admitting: Internal Medicine

## 2019-03-31 DIAGNOSIS — Z881 Allergy status to other antibiotic agents status: Secondary | ICD-10-CM

## 2019-03-31 DIAGNOSIS — N189 Chronic kidney disease, unspecified: Secondary | ICD-10-CM | POA: Diagnosis present

## 2019-03-31 DIAGNOSIS — J189 Pneumonia, unspecified organism: Secondary | ICD-10-CM | POA: Diagnosis not present

## 2019-03-31 DIAGNOSIS — Z8261 Family history of arthritis: Secondary | ICD-10-CM

## 2019-03-31 DIAGNOSIS — R0602 Shortness of breath: Secondary | ICD-10-CM | POA: Diagnosis not present

## 2019-03-31 DIAGNOSIS — Z96611 Presence of right artificial shoulder joint: Secondary | ICD-10-CM | POA: Diagnosis present

## 2019-03-31 DIAGNOSIS — D509 Iron deficiency anemia, unspecified: Secondary | ICD-10-CM | POA: Diagnosis present

## 2019-03-31 DIAGNOSIS — Z8371 Family history of colonic polyps: Secondary | ICD-10-CM

## 2019-03-31 DIAGNOSIS — N1831 Chronic kidney disease, stage 3a: Secondary | ICD-10-CM | POA: Diagnosis present

## 2019-03-31 DIAGNOSIS — D62 Acute posthemorrhagic anemia: Secondary | ICD-10-CM | POA: Diagnosis present

## 2019-03-31 DIAGNOSIS — D649 Anemia, unspecified: Secondary | ICD-10-CM | POA: Diagnosis present

## 2019-03-31 DIAGNOSIS — J449 Chronic obstructive pulmonary disease, unspecified: Secondary | ICD-10-CM | POA: Diagnosis present

## 2019-03-31 DIAGNOSIS — Z8349 Family history of other endocrine, nutritional and metabolic diseases: Secondary | ICD-10-CM

## 2019-03-31 DIAGNOSIS — Z811 Family history of alcohol abuse and dependence: Secondary | ICD-10-CM

## 2019-03-31 DIAGNOSIS — Z20828 Contact with and (suspected) exposure to other viral communicable diseases: Secondary | ICD-10-CM | POA: Diagnosis present

## 2019-03-31 DIAGNOSIS — N183 Chronic kidney disease, stage 3 (moderate): Secondary | ICD-10-CM | POA: Diagnosis present

## 2019-03-31 DIAGNOSIS — E876 Hypokalemia: Secondary | ICD-10-CM | POA: Diagnosis present

## 2019-03-31 DIAGNOSIS — Z96653 Presence of artificial knee joint, bilateral: Secondary | ICD-10-CM | POA: Diagnosis present

## 2019-03-31 DIAGNOSIS — J440 Chronic obstructive pulmonary disease with acute lower respiratory infection: Secondary | ICD-10-CM | POA: Diagnosis present

## 2019-03-31 DIAGNOSIS — I129 Hypertensive chronic kidney disease with stage 1 through stage 4 chronic kidney disease, or unspecified chronic kidney disease: Secondary | ICD-10-CM | POA: Diagnosis present

## 2019-03-31 DIAGNOSIS — J9601 Acute respiratory failure with hypoxia: Secondary | ICD-10-CM | POA: Diagnosis present

## 2019-03-31 DIAGNOSIS — I10 Essential (primary) hypertension: Secondary | ICD-10-CM | POA: Diagnosis present

## 2019-03-31 DIAGNOSIS — F988 Other specified behavioral and emotional disorders with onset usually occurring in childhood and adolescence: Secondary | ICD-10-CM | POA: Diagnosis present

## 2019-03-31 DIAGNOSIS — Z79899 Other long term (current) drug therapy: Secondary | ICD-10-CM

## 2019-03-31 DIAGNOSIS — F17210 Nicotine dependence, cigarettes, uncomplicated: Secondary | ICD-10-CM | POA: Diagnosis present

## 2019-03-31 DIAGNOSIS — E785 Hyperlipidemia, unspecified: Secondary | ICD-10-CM | POA: Diagnosis present

## 2019-03-31 DIAGNOSIS — F329 Major depressive disorder, single episode, unspecified: Secondary | ICD-10-CM | POA: Diagnosis present

## 2019-03-31 DIAGNOSIS — Z7951 Long term (current) use of inhaled steroids: Secondary | ICD-10-CM

## 2019-03-31 DIAGNOSIS — Z823 Family history of stroke: Secondary | ICD-10-CM

## 2019-03-31 LAB — BASIC METABOLIC PANEL
Anion gap: 8 (ref 5–15)
Anion gap: 9 (ref 5–15)
BUN: 26 mg/dL — ABNORMAL HIGH (ref 8–23)
BUN: 28 mg/dL — ABNORMAL HIGH (ref 8–23)
CO2: 22 mmol/L (ref 22–32)
CO2: 22 mmol/L (ref 22–32)
Calcium: 8 mg/dL — ABNORMAL LOW (ref 8.9–10.3)
Calcium: 8.1 mg/dL — ABNORMAL LOW (ref 8.9–10.3)
Chloride: 103 mmol/L (ref 98–111)
Chloride: 103 mmol/L (ref 98–111)
Creatinine, Ser: 1.29 mg/dL — ABNORMAL HIGH (ref 0.44–1.00)
Creatinine, Ser: 1.43 mg/dL — ABNORMAL HIGH (ref 0.44–1.00)
GFR calc Af Amer: 49 mL/min — ABNORMAL LOW (ref >60)
GFR calc Af Amer: 44 mL/min — ABNORMAL LOW (ref >60)
GFR calc non Af Amer: 43 mL/min — ABNORMAL LOW (ref >60)
GFR calc non Af Amer: 38 mL/min — ABNORMAL LOW (ref >60)
Glucose, Bld: 118 mg/dL — ABNORMAL HIGH (ref 70–99)
Glucose, Bld: 112 mg/dL — ABNORMAL HIGH (ref 70–99)
Potassium: 4.1 mmol/L (ref 3.5–5.1)
Potassium: 3.2 mmol/L — ABNORMAL LOW (ref 3.5–5.1)
Sodium: 133 mmol/L — ABNORMAL LOW (ref 135–145)
Sodium: 134 mmol/L — ABNORMAL LOW (ref 135–145)

## 2019-03-31 LAB — CBC WITH DIFFERENTIAL/PLATELET
Abs Immature Granulocytes: 0.04 10*3/uL (ref 0.00–0.07)
Basophils Absolute: 0 10*3/uL (ref 0.0–0.1)
Basophils Relative: 0 %
Eosinophils Absolute: 0 10*3/uL (ref 0.0–0.5)
Eosinophils Relative: 0 %
HCT: 26.4 % — ABNORMAL LOW (ref 36.0–46.0)
Hemoglobin: 8.5 g/dL — ABNORMAL LOW (ref 12.0–15.0)
Immature Granulocytes: 0 %
Lymphocytes Relative: 23 %
Lymphs Abs: 2.5 10*3/uL (ref 0.7–4.0)
MCH: 30.4 pg (ref 26.0–34.0)
MCHC: 32.2 g/dL (ref 30.0–36.0)
MCV: 94.3 fL (ref 80.0–100.0)
Monocytes Absolute: 1.2 10*3/uL — ABNORMAL HIGH (ref 0.1–1.0)
Monocytes Relative: 11 %
Neutro Abs: 7.3 10*3/uL (ref 1.7–7.7)
Neutrophils Relative %: 66 %
Platelets: 289 10*3/uL (ref 150–400)
RBC: 2.8 MIL/uL — ABNORMAL LOW (ref 3.87–5.11)
RDW: 13.6 % (ref 11.5–15.5)
WBC: 11.1 10*3/uL — ABNORMAL HIGH (ref 4.0–10.5)
nRBC: 0 % (ref 0.0–0.2)

## 2019-03-31 LAB — HEMOGLOBIN AND HEMATOCRIT, BLOOD
HCT: 30.2 % — ABNORMAL LOW (ref 36.0–46.0)
Hemoglobin: 9.4 g/dL — ABNORMAL LOW (ref 12.0–15.0)

## 2019-03-31 LAB — TROPONIN I (HIGH SENSITIVITY): Troponin I (High Sensitivity): 9 ng/L (ref <18)

## 2019-03-31 MED ORDER — IOHEXOL 350 MG/ML SOLN
100.0000 mL | Freq: Once | INTRAVENOUS | Status: AC | PRN
  Administered 2019-03-31: 80 mL via INTRAVENOUS

## 2019-03-31 MED ORDER — SODIUM CHLORIDE 0.9 % IV BOLUS
500.0000 mL | Freq: Once | INTRAVENOUS | Status: AC
  Administered 2019-03-31: 500 mL via INTRAVENOUS

## 2019-03-31 MED ORDER — MECLIZINE HCL 25 MG PO TABS
25.0000 mg | ORAL_TABLET | Freq: Once | ORAL | Status: AC
  Administered 2019-03-31: 25 mg via ORAL
  Filled 2019-03-31: qty 1

## 2019-03-31 MED ORDER — SODIUM CHLORIDE (PF) 0.9 % IJ SOLN
INTRAMUSCULAR | Status: AC
  Administered 2019-03-31: 23:00:00
  Filled 2019-03-31: qty 50

## 2019-03-31 NOTE — Progress Notes (Signed)
Orthopedics Progress Note  Subjective: Patient is comfortable this AM, no complaints  Objective:  Vitals:   03/31/19 0047 03/31/19 0443  BP: 113/60 (!) 113/58  Pulse: 92 85  Resp: 16 16  Temp: 97.8 F (36.6 C) 97.6 F (36.4 C)  SpO2: 97% 98%    General: Awake and alert  Musculoskeletal: Right shoulder dressing changed, wound looks good, minimal swelling and bruising Neurovascularly intact  Lab Results  Component Value Date   WBC 9.7 03/26/2019   HGB 9.4 (L) 03/31/2019   HCT 30.2 (L) 03/31/2019   MCV 94.8 03/26/2019   PLT 383 03/26/2019       Component Value Date/Time   NA 133 (L) 03/31/2019 0222   NA 138 02/18/2017 1157   K 4.1 03/31/2019 0222   CL 103 03/31/2019 0222   CO2 22 03/31/2019 0222   GLUCOSE 118 (H) 03/31/2019 0222   BUN 26 (H) 03/31/2019 0222   BUN 14 02/18/2017 1157   CREATININE 1.29 (H) 03/31/2019 0222   CREATININE 1.16 (H) 03/22/2016 1610   CALCIUM 8.0 (L) 03/31/2019 0222   GFRNONAA 43 (L) 03/31/2019 0222   GFRAA 49 (L) 03/31/2019 0222    Lab Results  Component Value Date   INR 2.03 (H) 02/27/2011   INR 2.70 (H) 02/26/2011   INR 2.02 (H) 02/25/2011    Assessment/Plan: POD #1 s/p Procedure(s): REVERSE TOTAL SHOULDER ARTHROPLASTY Discharge home after therapy, doing well.  Doran Heater. Veverly Fells, MD 03/31/2019 7:56 AM

## 2019-03-31 NOTE — ED Notes (Signed)
Unsuccessful IV attempt. Will put in consult for IV team.

## 2019-03-31 NOTE — ED Notes (Signed)
Patient transported to CT 

## 2019-03-31 NOTE — ED Provider Notes (Addendum)
[facility] DEPT Provider Note   CSN: [id number] Arrival date & time: 03/31/19  1755     History   Chief Complaint Chief Complaint  Patient presents with   Shortness of Breath    HPI Sheryl [name] is a 68 y.o. female who presents with shortness of breath and dizziness.  Past medical history significant for CKD, COPD, hypertension, hyperlipidemia.  She had a right shoulder replacement yesterday by Dr. Alma Friendly.  Her postop course was uncomplicated.  She was discharged home today and states that when she got home she got very winded when she was going up the stairs.  She reports associated coughing for the past 2 days.  She is an active smoker and smoked up until her surgery yesterday.  She also has been having episodes of "vertigo" for the past several months.  She takes meclizine for this but does not take it regularly.  She states she gets very dizzy when she stands up.  She denies syncope or near syncope.  She denies fever, chest pain, abdominal pain, leg swelling.  She used her albuterol inhaler at home and states that her shortness of breath is improved but she just wanted to get checked out. No blood in the stool.     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   CKD (chronic kidney disease) 12/27/2016   Insomnia 06/29/2016   Prediabetes 06/29/2016   LGSIL (low grade squamous intraepithelial  dysplasia) 12/08/2015   COPD (chronic obstructive pulmonary disease) ([place]) 12/09/2014   Lumbago 07/16/2014   Bilateral hand pain 07/16/2014   HTN (hypertension) 06/04/2012   Hyperlipidemia 06/04/2012   Depression 06/04/2012   ADD (attention deficit disorder) 06/04/2012   AR (allergic rhinitis) 06/04/2012    Past Surgical History:  Procedure Laterality Date   JOINT REPLACEMENT     right knee   MULTIPLE TOOTH EXTRACTIONS     REPLACEMENT TOTAL KNEE  2013   right   REVERSE SHOULDER ARTHROPLASTY Right 03/30/2019   Procedure: REVERSE TOTAL SHOULDER ARTHROPLASTY;  Surgeon: Netta Cedars, MD;  Location: [facility];  Service: Orthopedics;  Laterality: Right;  interscalene block   TOTAL KNEE ARTHROPLASTY Left 12/27/2017   TOTAL KNEE ARTHROPLASTY Left 12/27/2017   Procedure: LEFT TOTAL KNEE ARTHROPLASTY;  Surgeon: Netta Cedars, MD;  Location: [facility];  Service: Orthopedics;  Laterality: Left;     OB History    Gravida  2   Para  2   Term      Preterm      AB      Living  2     SAB      TAB      Ectopic      Multiple      Live Births               Home Medications  Prior to Admission medications   Medication Sig Start Date End Date Taking? Authorizing Provider  albuterol (PROVENTIL HFA;VENTOLIN HFA) 108 (90 Base) MCG/ACT inhaler Inhale 1-2 puffs into the lungs every 6 (six) hours as needed for wheezing or shortness of breath. 03/22/16   Philis Fendt L, PA-C  ANORO ELLIPTA 62.5-25 MCG/INH AEPB TAKE 1 PUFF BY MOUTH EVERY DAY Patient taking differently: Inhale 1 puff into the lungs daily.  08/11/18   Binnie Rail, MD  atomoxetine (STRATTERA) 100 MG capsule TAKE 1 CAPSULE BY MOUTH EVERY DAY Patient taking differently: Take 100 mg by mouth daily.  11/12/18   Binnie Rail, MD  FLUoxetine (PROZAC) 20 MG capsule TAKE 1 CAPSULE (20 MG TOTAL) BY MOUTH DAILY IN ADDITION TO 40 MG FOR A TOTAL OF 60 MG Patient taking differently: Take 20 mg by mouth daily. In  addition to 40 mg for a total of 60 mg 03/06/19   Binnie Rail, MD  FLUoxetine (PROZAC) 40 MG capsule Take 1 capsule (40 mg total) by mouth daily. In addition to 20 mg for total of 60 mg daily 11/13/18   Binnie Rail, MD  hydrochlorothiazide (HYDRODIURIL) 12.5 MG tablet Take 1 tablet (12.5 mg total) by mouth daily. 12/18/18   Binnie Rail, MD  HYDROcodone-acetaminophen (NORCO) 5-325 MG tablet Take 1 tablet by mouth every 4 (four) hours as needed for moderate pain or severe pain. 03/30/19   Netta Cedars, MD  losartan (COZAAR) 100 MG tablet Take 1 tablet (100 mg total) by mouth daily. 12/18/18   Binnie Rail, MD  meclizine (ANTIVERT) 25 MG tablet Take 1 tablet (25 mg total) by mouth 3 (three) times daily as needed for dizziness. 06/05/18   Binnie Rail, MD  rosuvastatin (CRESTOR) 10 MG tablet Take 1 tablet (10 mg total) by mouth daily. 12/18/18   Binnie Rail, MD  traMADol (ULTRAM) 50 MG tablet Take 1 tablet (50 mg total) by mouth every 6 (six) hours as needed. 10/19/18   Domenic Moras, PA-C  traZODone (DESYREL) 50 MG tablet TAKE 1 TABLET BY MOUTH EVERYDAY AT BEDTIME Patient taking differently: Take 50 mg by mouth at bedtime.  12/19/18   Binnie Rail, MD  TURMERIC PO Take 1,000 mg by mouth daily.     [provider]  ZINC PICOLINATE PO Take 50 mg by mouth daily.    [provider]    Family History Family History  Problem Relation Age of Onset   Hyperlipidemia Mother    Alcohol abuse Father    Arthritis Father    Colon polyps Father    Alcohol abuse Maternal Uncle    Stroke Maternal Grandmother     Social History Social History   Tobacco Use   Smoking status: Current Every Day Smoker    Packs/day: 0.25    Years: 49.00    Pack years: 12.25    Types: Cigarettes    Last attempt to quit: 06/02/2018    Years since quitting: 0.8   Smokeless tobacco: Never Used   Tobacco comment: Trying quit smoking!, resumed    Substance Use Topics   Alcohol use: No     Alcohol/week: 0.0 standard drinks   Drug use: No     Allergies   Macrobid [nitrofurantoin monohyd macro]   Review of Systems Review of Systems  Constitutional: Negative for chills and fever.  Respiratory: Positive for cough and shortness of breath. Negative for wheezing.   Cardiovascular: Negative for chest pain, palpitations and  leg swelling.  Gastrointestinal: Negative for abdominal pain, nausea and vomiting.  All other systems reviewed and are negative.    Physical Exam Updated Vital Signs BP (!) 113/56    Pulse 99    Temp 98.4 F (36.9 C) (Oral)    Resp 20    Ht 5\' 1"  (1.549 m)    Wt 62.1 kg    SpO2 94%    BMI 25.89 kg/m   Physical Exam Vitals signs and nursing note reviewed.  Constitutional:      General: She is not in acute distress.    Appearance: She is well-developed. She is not ill-appearing.     Comments: Calm and cooperative. In a shoulder sling  HENT:     Head: Normocephalic and atraumatic.  Eyes:     General: No scleral icterus.       Right eye: No discharge.        Left eye: No discharge.     Conjunctiva/sclera: Conjunctivae normal.     Pupils: Pupils are equal, round, and reactive to light.  Neck:     Musculoskeletal: Normal range of motion.  Cardiovascular:     Rate and Rhythm: Normal rate and regular rhythm.  Pulmonary:     Effort: Pulmonary effort is normal. No respiratory distress.     Breath sounds: Normal breath sounds.     Comments: O2 sats ranging from 89%-95% Abdominal:     General: There is no distension.     Palpations: Abdomen is soft.     Tenderness: There is no abdominal tenderness.  Musculoskeletal:     Right lower leg: No edema.     Left lower leg: No edema.  Skin:    General: Skin is warm and dry.  Neurological:     Mental Status: She is alert and oriented to person, place, and time.  Psychiatric:        Behavior: Behavior normal.      ED Treatments / Results  Labs (all labs ordered are listed, but only abnormal  results are displayed) Labs Reviewed  CBC WITH DIFFERENTIAL/PLATELET - Abnormal; Notable for the following components:      Result Value   WBC 11.1 (*)    RBC 2.80 (*)    Hemoglobin 8.5 (*)    HCT 26.4 (*)    Monocytes Absolute 1.2 (*)    All other components within normal limits  BASIC METABOLIC PANEL - Abnormal; Notable for the following components:   Sodium 134 (*)    Potassium 3.2 (*)    Glucose, Bld 112 (*)    BUN 28 (*)    Creatinine, Ser 1.43 (*)    Calcium 8.1 (*)    GFR calc non Af Amer 38 (*)    GFR calc Af Amer 44 (*)    All other components within normal limits  SARS CORONAVIRUS 2 (HOSPITAL ORDER, [facility])  TROPONIN I (HIGH SENSITIVITY)    EKG EKG Interpretation  Date/Time:  Tuesday March 31 2019 20:45:04 EDT Ventricular Rate:  92 PR Interval:    QRS Duration: 109 QT Interval:  408 QTC Calculation: 505 R Axis:   -28 Text Interpretation:  Sinus rhythm Borderline left axis deviation Abnormal R-wave progression, early transition Borderline T abnormalities, anterior leads Prolonged QT interval Confirmed by Lacretia Leigh ([id number]) on 03/31/2019 9:23:37 PM   Radiology Dg Chest 2 View  Result Date: 03/31/2019 CLINICAL DATA:  Shortness of breath EXAM: CHEST - 2 VIEW COMPARISON:  October 31, 2017 FINDINGS: There is elevation of the right hemidiaphragm. Streaky opacity seen at the right lung base. The left lung is clear. The cardiomediastinal silhouette is unchanged. Patient is status post right total shoulder arthroplasty with overlying subcutaneous emphysema. IMPRESSION: 1. Elevation of the right hemidiaphragm. 2. Streaky opacity at the right lung base which could be due to atelectasis and/or aspiration. Electronically Signed   By: Prudencio Pair M.D.   On: 03/31/2019 20:07   Ct Angio Chest Pe W/cm &/or Wo Cm  Result Date: 03/31/2019 CLINICAL DATA:  68 year old female with shortness of breath. Status post right shoulder surgery. EXAM: CT  ANGIOGRAPHY CHEST WITH CONTRAST TECHNIQUE: Multidetector CT imaging of the chest was performed using the standard protocol during bolus administration of intravenous contrast. Multiplanar CT image reconstructions and MIPs were obtained to evaluate the vascular anatomy. CONTRAST:  67mL OMNIPAQUE IOHEXOL 350 MG/ML SOLN COMPARISON:  Chest radiograph dated 03/31/2019 FINDINGS: Evaluation of this exam is limited due to respiratory motion artifact. Cardiovascular: There is mild cardiomegaly. No pericardial effusion. Mild atherosclerotic calcification of the thoracic aorta. No aneurysmal dilatation or dissection. The visualized origins of the great vessels of the aortic arch appear patent. Left carotid bulb calcified plaques noted. Evaluation of the pulmonary arteries is limited due to respiratory motion artifact and suboptimal visualization of the peripheral branches. No pulmonary artery embolus identified. Mediastinum/Nodes: There is no hilar or mediastinal adenopathy. The esophagus is grossly unremarkable. There is a 5 mm calcified nodule in the left lobe of the thyroid gland. No mediastinal fluid collection. Lungs/Pleura: There is eventration of the right hemidiaphragm. Patchy area of consolidative change at the right lung base may represent atelectasis. Pneumonia or aspiration is not excluded. Clinical correlation is recommended. There is a background of emphysema. There is no pleural effusion or pneumothorax. The central airways remain patent. Upper Abdomen: Apparent fatty infiltration of the liver. The visualized upper abdomen is otherwise unremarkable. Musculoskeletal: Postsurgical changes of right shoulder arthroplasty. There are pockets of air in the superficial soft tissues of the right axilla and right chest wall. No drainable fluid collection identified. No acute osseous pathology. Review of the MIP images confirms the above findings. IMPRESSION: 1. No CT evidence of central pulmonary artery embolus. 2. Right  lung base atelectasis versus infiltrate. Clinical correlation is recommended. 3. Emphysema. 4. Postsurgical changes of right shoulder arthroplasty. 5. Aortic Atherosclerosis (ICD10-I70.0) and Emphysema (ICD10-J43.9). Electronically Signed   By: Anner Crete M.D.   On: 03/31/2019 23:48   Dg Shoulder Right Port  Result Date: 03/30/2019 CLINICAL DATA:  Status post right shoulder arthroplasty. EXAM: PORTABLE RIGHT SHOULDER COMPARISON:  Radiographs of the right shoulder 10/19/2018 FINDINGS: There has been interval right shoulder arthroplasty. The humeral and glenoid components appear well seated. No adverse features. Degenerative change of the acromioclavicular joint. Ill-defined opacity at the right lung base may reflect atelectasis. IMPRESSION: Interval right shoulder arthroplasty without adverse features. Ill-defined opacity at the right lung base may reflect atelectasis. Pneumonia is difficult to exclude. Electronically Signed   By: Kellie Simmering   On: 03/30/2019 12:56    Procedures Procedures (including critical care time)  Medications Ordered in ED Medications  cefTRIAXone (ROCEPHIN) 1 g in sodium chloride 0.9 % 100 mL IVPB (has no administration in time range)  azithromycin (ZITHROMAX) 500 mg in sodium chloride 0.9 % 250 mL IVPB (has no administration in time range)  sodium chloride 0.9 % bolus 500 mL (0 mLs Intravenous Stopped 04/01/19 0036)  meclizine (ANTIVERT) tablet 25 mg (25 mg Oral  Given 03/31/19 2115)  sodium chloride (PF) 0.9 % injection (  Given by Other 03/31/19 2317)  iohexol (OMNIPAQUE) 350 MG/ML injection 100 mL (80 mLs Intravenous Contrast Given 03/31/19 2317)     Initial Impression / Assessment and Plan / ED Course  I have reviewed the triage vital signs and the nursing notes.  Pertinent labs & imaging results that were available during my care of the patient were reviewed by me and considered in my medical decision making (see chart for details).  68 year old female  presents with SOB and dizziness with standing.  Blood pressure is soft here and O2 sats in the low 90s and she is mildly tachypneic.  She is afebrile and heart rate is normal.  Her exam is unremarkable.  Will obtain EKG, chest x-ray, labs and troponin.  Will obtain CTA of chest to rule out PE  EKG is sinus rhythm.  Chest x-ray shows atelectasis versus right lower lobe pneumonia.  CBC is remarkable for mild leukocytosis of 11.  She also has a notable hemoglobin drop from 12 6 days ago to 8.5 today.  On review of the operative note from yesterday said that the blood loss was minimal.  She also received fluids so there may be some dilutional component.  The patient is denying symptoms of GI bleed and Hemoccult was negative.  BMP is remarkable for mild hyponatremia, mild hypokalemia.  She has chronic kidney disease.  Troponin was 9.  CTA is pending  CT is negative for PE but does again show right lower lobe pneumonia versus atelectasis.  Will cover for community-acquired pneumonia since she is coughing.  Ambulatory sats were obtained and she became tachycardic, tachypneic, and hypoxic into the mid 80s.  She was placed on 2 L via nasal cannula.  Discussed with Dr. Hal Hope will admit pending COVID test.  Final Clinical Impressions(s) / ED Diagnoses   Final diagnoses:  Anemia, unspecified type  Shortness of breath    ED Discharge Orders    None       Recardo Evangelist, PA-C 04/01/19 0122    Recardo Evangelist, PA-C 04/01/19 0122    Merryl Hacker, MD 04/01/19 [phone]

## 2019-03-31 NOTE — ED Triage Notes (Signed)
Pt had shoulder surgery yesterday and discharged from hospital today. When pt got home she was very winded getting up stairs, and increased dizziness.

## 2019-03-31 NOTE — Anesthesia Postprocedure Evaluation (Signed)
Anesthesia Post Note  Patient: Sheryl [name]  Procedure(s) Performed: REVERSE TOTAL SHOULDER ARTHROPLASTY (Right Shoulder)     Patient location during evaluation: PACU Anesthesia Type: General Level of consciousness: awake and alert Pain management: pain level controlled Vital Signs Assessment: post-procedure vital signs reviewed and stable Respiratory status: spontaneous breathing, nonlabored ventilation, respiratory function stable and patient connected to nasal cannula oxygen Cardiovascular status: blood pressure returned to baseline and stable Postop Assessment: no apparent nausea or vomiting Anesthetic complications: no    Last Vitals:  Vitals:   03/31/19 0916 03/31/19 0918  BP:    Pulse:    Resp:    Temp:    SpO2: (!) 89% 93%    Last Pain:  Vitals:   03/31/19 0911  TempSrc: Oral  PainSc:                  Airica Schwartzkopf S

## 2019-03-31 NOTE — Evaluation (Signed)
Occupational Therapy Evaluation Patient Details Name: Sheryl [name] MRN: [id number] DOB: [birth year] Today's Date: 03/31/2019    History of Present Illness 68 year old female s/p R RTSA   Clinical Impression   Pt was admitted for the above. At baseline, she lives with her daughter but is independent with adls. Pt reports she has had vertigo since the spring; she has not fallen but feels spinning sensation, sometimes worse than others.  Daughter will be with her and encouraged her to wear gaitbelt and have daughter nearby for safety, especially with her arm slinged. Pt verbalizes understanding of all education. Handouts given.    Follow Up Recommendations  Supervision/Assistance - 24 hour;Follow surgeon's recommendation for DC plan and follow-up therapies    Equipment Recommendations  None recommended by OT(pt will sponge bathe)    Recommendations for Other Services       Precautions / Restrictions Precautions Precautions: Fall Precaution Comments: pt has had vertigo since spring:  worse at sometimes but she reports it is always present Restrictions Weight Bearing Restrictions: Yes Other Position/Activity Restrictions: R RTSA      Mobility Bed Mobility               General bed mobility comments: min A for trunk for OOB  Transfers                 General transfer comment: min guard for safety    Balance                                           ADL either performed or assessed with clinical judgement   ADL Overall ADL's : Needs assistance/impaired Eating/Feeding: Set up   Grooming: Set up   Upper Body Bathing: Moderate assistance   Lower Body Bathing: Moderate assistance   Upper Body Dressing : Moderate assistance;Maximal assistance   Lower Body Dressing: Maximal assistance   Toilet Transfer: Min guard;Stand-pivot(chair)   Toileting- Clothing Manipulation and Hygiene: Moderate assistance;Minimal assistance          General ADL Comments: performed ADL.  Pt c/o vertigo which she reports has been going on since the spring.  No LOB but min guard for safety. Educated on gait belt at home, min guard assist when walking.  Reviewed protocol and pt verbalizes understanding     Vision         Perception     Praxis      Pertinent Vitals/Pain Pain Assessment: Faces Faces Pain Scale: Hurts a little bit Pain Location: R shoulder/scapula Pain Descriptors / Indicators: Sore Pain Intervention(s): Limited activity within patient's tolerance;Monitored during session;Premedicated before session;Repositioned;Ice applied     Hand Dominance Left   Extremity/Trunk Assessment Upper Extremity Assessment Upper Extremity Assessment: RUE deficits/detail RUE Deficits / Details: able to move fingers; wrist           Communication Communication Communication: No difficulties   Cognition Arousal/Alertness: Awake/alert Behavior During Therapy: WFL for tasks assessed/performed Overall Cognitive Status: Within Functional Limits for tasks assessed                                     General Comments  asked ortho tech to look at sling to see if a smaller size would be indicated    Exercises     Shoulder Instructions  Home Living Family/patient expects to be discharged to:: Private residence Living Arrangements: Children                 Bathroom Shower/Tub: Teacher, early years/pre: Standard         Additional Comments: doesn't think she still has her shower seat.  She will sponge bathe      Prior Functioning/Environment Level of Independence: Independent                 OT Problem List:        OT Treatment/Interventions:      OT Goals(Current goals can be found in the care plan section) Acute Rehab OT Goals Patient Stated Goal: avoid having to have L shoulder replacement OT Goal Formulation: All assessment and education complete, DC therapy  OT  Frequency:     Barriers to D/C:            Co-evaluation              AM-PAC OT "6 Clicks" Daily Activity     Outcome Measure Help from another person eating meals?: A Little Help from another person taking care of personal grooming?: A Little Help from another person toileting, which includes using toliet, bedpan, or urinal?: A Lot Help from another person bathing (including washing, rinsing, drying)?: A Lot Help from another person to put on and taking off regular upper body clothing?: A Lot Help from another person to put on and taking off regular lower body clothing?: A Lot 6 Click Score: 14   End of Session    Activity Tolerance: No increased pain Patient left: in chair;with call bell/phone within reach  OT Visit Diagnosis: Pain Pain - Right/Left: Right Pain - part of body: Shoulder                Time: 3300-7622 OT Time Calculation (min): 43 min Charges:  OT General Charges $OT Visit: 1 Visit OT Evaluation $OT Eval Low Complexity: 1 Low OT Treatments $Self Care/Home Management : 23-37 mins  Lesle Chris, OTR/L Acute Rehabilitation Services [phone] [facility] pager [phone] office 03/31/2019  [place] 03/31/2019, 10:30 AM

## 2019-03-31 NOTE — Discharge Summary (Signed)
Orthopedic Discharge Summary        Physician Discharge Summary  Patient ID: Sheryl [name] MRN: [id number] DOB/AGE: 69-Feb-1952 69 y.o.  Admit date: 03/30/2019 Discharge date: 03/31/2019   Procedures:  Procedure(s) (LRB): REVERSE TOTAL SHOULDER ARTHROPLASTY (Right)  Attending Physician:  Dr. Esmond Plants  Admission Diagnoses:  Right shoulder rotator cuff tear arthropathy  Discharge Diagnoses:  same   Past Medical History:  Diagnosis Date  . ADD (attention deficit disorder)   . Allergy   . Arthritis   . CKD (chronic kidney disease), stage III ([place])   . COPD (chronic obstructive pulmonary disease) ([place]) 12/09/2014  . Depression   . Does use hearing aid   . Dyspnea    on exertion  . Hyperlipidemia   . Hypertension   . LGSIL (low grade squamous intraepithelial dysplasia)   . Osteoporosis   . Pre-diabetes     PCP: Binnie Rail, MD   Discharged Condition: good  Hospital Course:  Patient underwent the above stated procedure on 03/30/2019. Patient tolerated the procedure well and brought to the recovery room in good condition and subsequently to the floor. Patient had an uncomplicated hospital course and was stable for discharge.   Disposition: Discharge disposition: 01-Home or Self Care      with follow up in 2 weeks   Follow-up Information    Netta Cedars, MD. Call in 2 weeks.   Specialty: Orthopedic Surgery Why: [phone] Contact information: [address] [place] [address] [phone]           Discharge Instructions    Call MD / Call 911   Complete by: As directed    If you experience chest pain or shortness of breath, CALL 911 and be transported to the hospital emergency room.  If you develope a fever above 101 F, pus (white drainage) or increased drainage or redness at the wound, or calf pain, call your surgeon's office.   Constipation Prevention   Complete by: As directed    Drink plenty of fluids.  Prune juice  may be helpful.  You may use a stool softener, such as Colace (over the counter) 100 mg twice a day.  Use MiraLax (over the counter) for constipation as needed.   Diet - low sodium heart healthy   Complete by: As directed    Increase activity slowly as tolerated   Complete by: As directed       Allergies as of 03/31/2019      Reactions   Macrobid [nitrofurantoin Monohyd Macro] Nausea And Vomiting   GI upset      Medication List    STOP taking these medications   Estradiol 10 MCG Tabs vaginal tablet   fluticasone 50 MCG/ACT nasal spray Commonly known as: FLONASE   ipratropium 0.03 % nasal spray Commonly known as: ATROVENT     TAKE these medications   albuterol 108 (90 Base) MCG/ACT inhaler Commonly known as: VENTOLIN HFA Inhale 1-2 puffs into the lungs every 6 (six) hours as needed for wheezing or shortness of breath.   Anoro Ellipta 62.5-25 MCG/INH Aepb Generic drug: umeclidinium-vilanterol TAKE 1 PUFF BY MOUTH EVERY DAY What changed: See the new instructions.   atomoxetine 100 MG capsule Commonly known as: STRATTERA TAKE 1 CAPSULE BY MOUTH EVERY DAY What changed: how much to take   FLUoxetine 40 MG capsule Commonly known as: PROZAC Take 1 capsule (40 mg total) by mouth daily. In addition to 20 mg for total of 60 mg  daily What changed: Another medication with the same name was changed. Make sure you understand how and when to take each.   FLUoxetine 20 MG capsule Commonly known as: PROZAC TAKE 1 CAPSULE (20 MG TOTAL) BY MOUTH DAILY IN ADDITION TO 40 MG FOR A TOTAL OF 60 MG What changed: See the new instructions.   hydrochlorothiazide 12.5 MG tablet Commonly known as: HYDRODIURIL Take 1 tablet (12.5 mg total) by mouth daily.   HYDROcodone-acetaminophen 5-325 MG tablet Commonly known as: Norco Take 1 tablet by mouth every 4 (four) hours as needed for moderate pain or severe pain.   losartan 100 MG tablet Commonly known as: COZAAR Take 1 tablet (100 mg total)  by mouth daily.   meclizine 25 MG tablet Commonly known as: ANTIVERT Take 1 tablet (25 mg total) by mouth 3 (three) times daily as needed for dizziness.   rosuvastatin 10 MG tablet Commonly known as: CRESTOR Take 1 tablet (10 mg total) by mouth daily.   traMADol 50 MG tablet Commonly known as: ULTRAM Take 1 tablet (50 mg total) by mouth every 6 (six) hours as needed.   traZODone 50 MG tablet Commonly known as: DESYREL TAKE 1 TABLET BY MOUTH EVERYDAY AT BEDTIME What changed:   how much to take  how to take this  when to take this  additional instructions   TURMERIC PO Take 1,000 mg by mouth daily.   ZINC PICOLINATE PO Take 50 mg by mouth daily.         Signed: Augustin Schooling 03/31/2019, 7:58 AM  [facility] is now [facility] [address] [place], [place], [place] [address] Phone: [place]

## 2019-03-31 NOTE — ED Notes (Signed)
IV team at bedside 

## 2019-03-31 NOTE — Telephone Encounter (Signed)
Pt called with having shortness of breath. Has a hx of COPD. Just got home from the hospital from having shoulder surgery. Speaking in phrases. She is also dizzy and has a cough. Per protocol, she should go to the ED. Pt stated her daughter will be taking her there. Routing to [facility] at [facility] for review.  Reason for Disposition . [1] MODERATE difficulty breathing (e.g., speaks in phrases, SOB even at rest, pulse 100-120) AND [2] NEW-onset or WORSE than normal  Answer Assessment - Initial Assessment Questions 1. RESPIRATORY STATUS: "Describe your breathing?" (e.g., wheezing, shortness of breath, unable to speak, severe coughing)      Shortness of breathing 2. ONSET: "When did this breathing problem begin?"      Today when leaving the hospital 3. PATTERN "Does the difficult breathing come and go, or has it been constant since it started?"      Comes and goes 4. SEVERITY: "How bad is your breathing?" (e.g., mild, moderate, severe)    - MILD: No SOB at rest, mild SOB with walking, speaks normally in sentences, can lay down, no retractions, pulse < 100.    - MODERATE: SOB at rest, SOB with minimal exertion and prefers to sit, cannot lie down flat, speaks in phrases, mild retractions, audible wheezing, pulse 100-120.    - SEVERE: Very SOB at rest, speaks in single words, struggling to breathe, sitting hunched forward, retractions, pulse > 120      Severe if walking  5. RECURRENT SYMPTOM: "Have you had difficulty breathing before?" If so, ask: "When was the last time?" and "What happened that time?"      Has not had this shortness of breath before.  CARDIAC HISTORY: "Do you have any history of heart disease?" (e.g., heart attack, angina, bypass surgery, angioplasty)      no 7. LUNG HISTORY: "Do you have any history of lung disease?"  (e.g., pulmonary embolus, asthma, emphysema)     COPD  8. CAUSE: "What do you think is causing the breathing problem?"      COPD  9. OTHER SYMPTOMS: "Do you have  any other symptoms? (e.g., dizziness, runny nose, cough, chest pain, fever)     Cough and dizziness,  10. PREGNANCY: "Is there any chance you are pregnant?" "When was your last menstrual period?"       n/a 11. TRAVEL: "Have you traveled out of the country in the last month?" (e.g., travel history, exposures)       n/a  Protocols used: BREATHING DIFFICULTY-A-AH

## 2019-04-01 ENCOUNTER — Encounter (HOSPITAL_COMMUNITY): Payer: Self-pay | Admitting: Internal Medicine

## 2019-04-01 ENCOUNTER — Telehealth: Payer: Self-pay | Admitting: *Deleted

## 2019-04-01 DIAGNOSIS — Z7951 Long term (current) use of inhaled steroids: Secondary | ICD-10-CM | POA: Diagnosis not present

## 2019-04-01 DIAGNOSIS — N183 Chronic kidney disease, stage 3 (moderate): Secondary | ICD-10-CM | POA: Diagnosis present

## 2019-04-01 DIAGNOSIS — Z8371 Family history of colonic polyps: Secondary | ICD-10-CM | POA: Diagnosis not present

## 2019-04-01 DIAGNOSIS — F988 Other specified behavioral and emotional disorders with onset usually occurring in childhood and adolescence: Secondary | ICD-10-CM | POA: Diagnosis present

## 2019-04-01 DIAGNOSIS — J9601 Acute respiratory failure with hypoxia: Secondary | ICD-10-CM | POA: Diagnosis present

## 2019-04-01 DIAGNOSIS — Z823 Family history of stroke: Secondary | ICD-10-CM | POA: Diagnosis not present

## 2019-04-01 DIAGNOSIS — J189 Pneumonia, unspecified organism: Secondary | ICD-10-CM | POA: Diagnosis present

## 2019-04-01 DIAGNOSIS — D62 Acute posthemorrhagic anemia: Secondary | ICD-10-CM

## 2019-04-01 DIAGNOSIS — F17210 Nicotine dependence, cigarettes, uncomplicated: Secondary | ICD-10-CM | POA: Diagnosis present

## 2019-04-01 DIAGNOSIS — Z881 Allergy status to other antibiotic agents status: Secondary | ICD-10-CM | POA: Diagnosis not present

## 2019-04-01 DIAGNOSIS — F329 Major depressive disorder, single episode, unspecified: Secondary | ICD-10-CM | POA: Diagnosis present

## 2019-04-01 DIAGNOSIS — D649 Anemia, unspecified: Secondary | ICD-10-CM | POA: Diagnosis present

## 2019-04-01 DIAGNOSIS — Z20828 Contact with and (suspected) exposure to other viral communicable diseases: Secondary | ICD-10-CM | POA: Diagnosis present

## 2019-04-01 DIAGNOSIS — Z8349 Family history of other endocrine, nutritional and metabolic diseases: Secondary | ICD-10-CM | POA: Diagnosis not present

## 2019-04-01 DIAGNOSIS — R0602 Shortness of breath: Secondary | ICD-10-CM | POA: Diagnosis present

## 2019-04-01 DIAGNOSIS — Z79899 Other long term (current) drug therapy: Secondary | ICD-10-CM | POA: Diagnosis not present

## 2019-04-01 DIAGNOSIS — Z811 Family history of alcohol abuse and dependence: Secondary | ICD-10-CM | POA: Diagnosis not present

## 2019-04-01 DIAGNOSIS — E785 Hyperlipidemia, unspecified: Secondary | ICD-10-CM | POA: Diagnosis present

## 2019-04-01 DIAGNOSIS — Z96653 Presence of artificial knee joint, bilateral: Secondary | ICD-10-CM | POA: Diagnosis present

## 2019-04-01 DIAGNOSIS — E876 Hypokalemia: Secondary | ICD-10-CM | POA: Diagnosis present

## 2019-04-01 DIAGNOSIS — J440 Chronic obstructive pulmonary disease with acute lower respiratory infection: Secondary | ICD-10-CM | POA: Diagnosis present

## 2019-04-01 DIAGNOSIS — Z96611 Presence of right artificial shoulder joint: Secondary | ICD-10-CM | POA: Diagnosis present

## 2019-04-01 DIAGNOSIS — Z8261 Family history of arthritis: Secondary | ICD-10-CM | POA: Diagnosis not present

## 2019-04-01 DIAGNOSIS — I129 Hypertensive chronic kidney disease with stage 1 through stage 4 chronic kidney disease, or unspecified chronic kidney disease: Secondary | ICD-10-CM | POA: Diagnosis present

## 2019-04-01 DIAGNOSIS — D509 Iron deficiency anemia, unspecified: Secondary | ICD-10-CM | POA: Diagnosis present

## 2019-04-01 LAB — RETICULOCYTES
Immature Retic Fract: 9.9 % (ref 2.3–15.9)
RBC.: 2.69 MIL/uL — ABNORMAL LOW (ref 3.87–5.11)
Retic Count, Absolute: 45.7 10*3/uL (ref 19.0–186.0)
Retic Ct Pct: 1.7 % (ref 0.4–3.1)

## 2019-04-01 LAB — COMPREHENSIVE METABOLIC PANEL
ALT: 23 U/L (ref 0–44)
AST: 45 U/L — ABNORMAL HIGH (ref 15–41)
Albumin: 2.7 g/dL — ABNORMAL LOW (ref 3.5–5.0)
Alkaline Phosphatase: 107 U/L (ref 38–126)
Anion gap: 9 (ref 5–15)
BUN: 20 mg/dL (ref 8–23)
CO2: 21 mmol/L — ABNORMAL LOW (ref 22–32)
Calcium: 7.6 mg/dL — ABNORMAL LOW (ref 8.9–10.3)
Chloride: 106 mmol/L (ref 98–111)
Creatinine, Ser: 0.91 mg/dL (ref 0.44–1.00)
GFR calc Af Amer: >60 mL/min (ref >60)
GFR calc non Af Amer: >60 mL/min (ref >60)
Glucose, Bld: 124 mg/dL — ABNORMAL HIGH (ref 70–99)
Potassium: 3.3 mmol/L — ABNORMAL LOW (ref 3.5–5.1)
Sodium: 136 mmol/L (ref 135–145)
Total Bilirubin: 0.3 mg/dL (ref 0.3–1.2)
Total Protein: 6.1 g/dL — ABNORMAL LOW (ref 6.5–8.1)

## 2019-04-01 LAB — MAGNESIUM: Magnesium: 2 mg/dL (ref 1.7–2.4)

## 2019-04-01 LAB — HIV ANTIBODY (ROUTINE TESTING W REFLEX): HIV Screen 4th Generation wRfx: NONREACTIVE

## 2019-04-01 LAB — BRAIN NATRIURETIC PEPTIDE: B Natriuretic Peptide: 125.1 pg/mL — ABNORMAL HIGH (ref 0.0–100.0)

## 2019-04-01 LAB — CBC WITH DIFFERENTIAL/PLATELET
Abs Immature Granulocytes: 0.05 10*3/uL (ref 0.00–0.07)
Basophils Absolute: 0 10*3/uL (ref 0.0–0.1)
Basophils Relative: 0 %
Eosinophils Absolute: 0 10*3/uL (ref 0.0–0.5)
Eosinophils Relative: 0 %
HCT: 25 % — ABNORMAL LOW (ref 36.0–46.0)
Hemoglobin: 8.1 g/dL — ABNORMAL LOW (ref 12.0–15.0)
Immature Granulocytes: 1 %
Lymphocytes Relative: 13 %
Lymphs Abs: 1.3 10*3/uL (ref 0.7–4.0)
MCH: 30.2 pg (ref 26.0–34.0)
MCHC: 32.4 g/dL (ref 30.0–36.0)
MCV: 93.3 fL (ref 80.0–100.0)
Monocytes Absolute: 0.9 10*3/uL (ref 0.1–1.0)
Monocytes Relative: 10 %
Neutro Abs: 7.3 10*3/uL (ref 1.7–7.7)
Neutrophils Relative %: 76 %
Platelets: 253 10*3/uL (ref 150–400)
RBC: 2.68 MIL/uL — ABNORMAL LOW (ref 3.87–5.11)
RDW: 13.9 % (ref 11.5–15.5)
WBC: 9.5 10*3/uL (ref 4.0–10.5)
nRBC: 0 % (ref 0.0–0.2)

## 2019-04-01 LAB — TROPONIN I (HIGH SENSITIVITY): Troponin I (High Sensitivity): 8 ng/L (ref <18)

## 2019-04-01 LAB — FERRITIN: Ferritin: 39 ng/mL (ref 11–307)

## 2019-04-01 LAB — IRON AND TIBC
Iron: 17 ug/dL — ABNORMAL LOW (ref 28–170)
Saturation Ratios: 7 % — ABNORMAL LOW (ref 10.4–31.8)
TIBC: 255 ug/dL (ref 250–450)
UIBC: 238 ug/dL

## 2019-04-01 LAB — POC OCCULT BLOOD, ED: Fecal Occult Bld: NEGATIVE

## 2019-04-01 LAB — SARS CORONAVIRUS 2 BY RT PCR (HOSPITAL ORDER, PERFORMED IN ~~LOC~~ HOSPITAL LAB): SARS Coronavirus 2: NEGATIVE

## 2019-04-01 LAB — VITAMIN B12: Vitamin B-12: 291 pg/mL (ref 180–914)

## 2019-04-01 LAB — FOLATE: Folate: 17.1 ng/mL (ref >5.9)

## 2019-04-01 MED ORDER — FLUOXETINE HCL 20 MG PO CAPS: 20.0000 mg | ORAL_CAPSULE | Freq: Every day | ORAL | Status: DC

## 2019-04-01 MED ORDER — ROSUVASTATIN CALCIUM 10 MG PO TABS
10.0000 mg | ORAL_TABLET | Freq: Every day | ORAL | Status: DC
  Administered 2019-04-01 – 2019-04-04 (×4): 10 mg via ORAL
  Filled 2019-04-01 (×4): qty 1

## 2019-04-01 MED ORDER — SODIUM CHLORIDE 0.9 % IV SOLN
2.0000 g | INTRAVENOUS | Status: DC
  Administered 2019-04-01 – 2019-04-03 (×3): 2 g via INTRAVENOUS
  Filled 2019-04-01: qty 20
  Filled 2019-04-01 (×3): qty 2

## 2019-04-01 MED ORDER — SODIUM CHLORIDE 0.9 % IV SOLN
500.0000 mg | INTRAVENOUS | Status: DC
  Administered 2019-04-02 – 2019-04-04 (×3): 500 mg via INTRAVENOUS
  Filled 2019-04-01 (×3): qty 500

## 2019-04-01 MED ORDER — POTASSIUM CHLORIDE CRYS ER 20 MEQ PO TBCR
40.0000 meq | EXTENDED_RELEASE_TABLET | Freq: Once | ORAL | Status: AC
  Administered 2019-04-01: 40 meq via ORAL
  Filled 2019-04-01: qty 2

## 2019-04-01 MED ORDER — SODIUM CHLORIDE 0.9 % IV SOLN
1.0000 g | Freq: Once | INTRAVENOUS | Status: AC
  Administered 2019-04-01: 1 g via INTRAVENOUS
  Filled 2019-04-01: qty 10

## 2019-04-01 MED ORDER — UMECLIDINIUM-VILANTEROL 62.5-25 MCG/INH IN AEPB
1.0000 | INHALATION_SPRAY | Freq: Every day | RESPIRATORY_TRACT | Status: DC
  Administered 2019-04-01 – 2019-04-04 (×4): 1 via RESPIRATORY_TRACT
  Filled 2019-04-01: qty 14

## 2019-04-01 MED ORDER — MECLIZINE HCL 25 MG PO TABS: 25.0000 mg | ORAL_TABLET | Freq: Three times a day (TID) | ORAL | Status: DC | PRN

## 2019-04-01 MED ORDER — ALBUTEROL SULFATE (2.5 MG/3ML) 0.083% IN NEBU: 2.5000 mg | INHALATION_SOLUTION | Freq: Four times a day (QID) | RESPIRATORY_TRACT | Status: DC | PRN

## 2019-04-01 MED ORDER — ONDANSETRON HCL 4 MG PO TABS: 4.0000 mg | ORAL_TABLET | Freq: Four times a day (QID) | ORAL | Status: DC | PRN

## 2019-04-01 MED ORDER — TRAZODONE HCL 50 MG PO TABS
50.0000 mg | ORAL_TABLET | Freq: Every day | ORAL | Status: DC
  Administered 2019-04-01 – 2019-04-03 (×2): 50 mg via ORAL
  Filled 2019-04-01 (×2): qty 1

## 2019-04-01 MED ORDER — ONDANSETRON HCL 4 MG/2ML IJ SOLN: 4.0000 mg | Freq: Four times a day (QID) | INTRAMUSCULAR | Status: DC | PRN

## 2019-04-01 MED ORDER — ATOMOXETINE HCL 60 MG PO CAPS
100.0000 mg | ORAL_CAPSULE | Freq: Every day | ORAL | Status: DC
  Administered 2019-04-01 – 2019-04-04 (×4): 100 mg via ORAL
  Filled 2019-04-01 (×4): qty 1

## 2019-04-01 MED ORDER — LOSARTAN POTASSIUM 50 MG PO TABS
100.0000 mg | ORAL_TABLET | Freq: Every day | ORAL | Status: DC
  Administered 2019-04-01: 100 mg via ORAL
  Filled 2019-04-01: qty 2

## 2019-04-01 MED ORDER — FLUOXETINE HCL 40 MG PO CAPS: 40.0000 mg | ORAL_CAPSULE | Freq: Every day | ORAL | Status: DC

## 2019-04-01 MED ORDER — SENNOSIDES-DOCUSATE SODIUM 8.6-50 MG PO TABS
1.0000 | ORAL_TABLET | Freq: Two times a day (BID) | ORAL | Status: DC
  Administered 2019-04-01 – 2019-04-04 (×3): 1 via ORAL
  Filled 2019-04-01 (×5): qty 1

## 2019-04-01 MED ORDER — ACETAMINOPHEN 325 MG PO TABS: 650.0000 mg | ORAL_TABLET | Freq: Four times a day (QID) | ORAL | Status: DC | PRN

## 2019-04-01 MED ORDER — FLUOXETINE HCL 20 MG PO CAPS
60.0000 mg | ORAL_CAPSULE | Freq: Every day | ORAL | Status: DC
  Administered 2019-04-01 – 2019-04-04 (×4): 60 mg via ORAL
  Filled 2019-04-01 (×4): qty 3

## 2019-04-01 MED ORDER — TRAMADOL HCL 50 MG PO TABS: 50.0000 mg | ORAL_TABLET | Freq: Four times a day (QID) | ORAL | Status: DC | PRN

## 2019-04-01 MED ORDER — CYANOCOBALAMIN 1000 MCG/ML IJ SOLN
1000.0000 ug | Freq: Once | INTRAMUSCULAR | Status: AC
  Administered 2019-04-01: 1000 ug via INTRAMUSCULAR
  Filled 2019-04-01: qty 1

## 2019-04-01 MED ORDER — HYDROCODONE-ACETAMINOPHEN 5-325 MG PO TABS
1.0000 | ORAL_TABLET | ORAL | Status: DC | PRN
  Administered 2019-04-01 – 2019-04-03 (×8): 1 via ORAL
  Filled 2019-04-01 (×8): qty 1

## 2019-04-01 MED ORDER — SODIUM CHLORIDE 0.9 % IV SOLN
510.0000 mg | Freq: Once | INTRAVENOUS | Status: AC
  Administered 2019-04-01: 510 mg via INTRAVENOUS
  Filled 2019-04-01 (×2): qty 17

## 2019-04-01 MED ORDER — ASPIRIN 81 MG PO CHEW
162.0000 mg | CHEWABLE_TABLET | Freq: Every day | ORAL | Status: DC
  Administered 2019-04-01 – 2019-04-04 (×4): 162 mg via ORAL
  Filled 2019-04-01 (×4): qty 2

## 2019-04-01 MED ORDER — POLYETHYLENE GLYCOL 3350 17 G PO PACK
17.0000 g | PACK | Freq: Two times a day (BID) | ORAL | Status: DC
  Administered 2019-04-01: 17 g via ORAL
  Filled 2019-04-01 (×4): qty 1

## 2019-04-01 MED ORDER — HYDRALAZINE HCL 20 MG/ML IJ SOLN: 10.0000 mg | INTRAMUSCULAR | Status: DC | PRN

## 2019-04-01 MED ORDER — ACETAMINOPHEN 650 MG RE SUPP: 650.0000 mg | Freq: Four times a day (QID) | RECTAL | Status: DC | PRN

## 2019-04-01 MED ORDER — POTASSIUM CHLORIDE CRYS ER 20 MEQ PO TBCR
20.0000 meq | EXTENDED_RELEASE_TABLET | Freq: Once | ORAL | Status: AC
  Administered 2019-04-01: 20 meq via ORAL
  Filled 2019-04-01: qty 1

## 2019-04-01 MED ORDER — SODIUM CHLORIDE 0.9 % IV SOLN
500.0000 mg | Freq: Once | INTRAVENOUS | Status: AC
  Administered 2019-04-01: 500 mg via INTRAVENOUS
  Filled 2019-04-01: qty 500

## 2019-04-01 NOTE — ED Notes (Signed)
ED TO INPATIENT HANDOFF REPORT  Name/Age/Gender Durene Cal 68 y.o. female  Code Status Code Status History    Date Active Date Inactive Code Status Order ID Comments User Context   03/30/2019 1342 03/31/2019 1514 Full Code [id number]  Netta Cedars, MD Inpatient   12/27/2017 1727 12/29/2017 2101 Full Code [id number]  Netta Cedars, MD Inpatient   Advance Care Planning Activity    Advance Directive Documentation     Most Recent Value  Type of Advance Directive  Living will  Pre-existing out of facility DNR order (yellow form or pink MOST form)  -  "MOST" Form in Place?  -      Home/SNF/Other Home  Chief Complaint Dizziness, SOB  Level of Care/Admitting Diagnosis ED Disposition    ED Disposition Condition [place]: [place] [[id number]]  Level of Care: Telemetry [5]  Admit to tele based on following criteria: Monitor for Ischemic changes  Covid Evaluation: N/A  Diagnosis: Acute respiratory failure with hypoxia [facility]) [[id number]]  Admitting Physician: Rise Patience [phone]  Attending Physician: Rise Patience [email or web address]  PT Class (Do Not Modify): Observation [104]  PT Acc Code (Do Not Modify): Observation [10022]       Medical History Past Medical History:  Diagnosis Date  . ADD (attention deficit disorder)   . Allergy   . Arthritis   . CKD (chronic kidney disease), stage III ([place])   . COPD (chronic obstructive pulmonary disease) ([place]) 12/09/2014  . Depression   . Does use hearing aid   . Dyspnea    on exertion  . Hyperlipidemia   . Hypertension   . LGSIL (low grade squamous intraepithelial dysplasia)   . Osteoporosis   . Pre-diabetes     Allergies Allergies  Allergen Reactions  . Macrobid [Nitrofurantoin Monohyd Macro] Nausea And Vomiting    GI upset    IV Location/Drains/Wounds Patient Lines/Drains/Airways Status   Active Line/Drains/Airways    Name:   Placement date:   Placement time:   Site:   Days:    Peripheral IV 03/31/19 Left;Anterior;Upper Arm   03/31/19    2251    Arm   1   Incision (Closed) 12/27/17 Knee Left   12/27/17    1211     460   Incision (Closed) 03/30/19 Shoulder Right   03/30/19    1042     2          Labs/Imaging Results for orders placed or performed during the hospital encounter of 03/31/19 (from the past 48 hour(s))  CBC with Differential     Status: Abnormal   Collection Time: 03/31/19 10:52 PM  Result Value Ref Range   WBC 11.1 (H) 4.0 - 10.5 K/uL   RBC 2.80 (L) 3.87 - 5.11 MIL/uL   Hemoglobin 8.5 (L) 12.0 - 15.0 g/dL   HCT 26.4 (L) 36.0 - 46.0 %   MCV 94.3 80.0 - 100.0 fL   MCH 30.4 26.0 - 34.0 pg   MCHC 32.2 30.0 - 36.0 g/dL   RDW 13.6 11.5 - 15.5 %   Platelets 289 150 - 400 K/uL   nRBC 0.0 0.0 - 0.2 %   Neutrophils Relative % 66 %   Neutro Abs 7.3 1.7 - 7.7 K/uL   Lymphocytes Relative 23 %   Lymphs Abs 2.5 0.7 - 4.0 K/uL   Monocytes Relative 11 %   Monocytes Absolute 1.2 (H) 0.1 - 1.0 K/uL   Eosinophils Relative 0 %  Eosinophils Absolute 0.0 0.0 - 0.5 K/uL   Basophils Relative 0 %   Basophils Absolute 0.0 0.0 - 0.1 K/uL   Immature Granulocytes 0 %   Abs Immature Granulocytes 0.04 0.00 - 0.07 K/uL    Comment: Performed at [facility], [place] [address], [place], [place] [address]  Basic metabolic panel     Status: Abnormal   Collection Time: 03/31/19 10:52 PM  Result Value Ref Range   Sodium 134 (L) 135 - 145 mmol/L   Potassium 3.2 (L) 3.5 - 5.1 mmol/L    Comment: DELTA CHECK NOTED REPEATED TO VERIFY    Chloride 103 98 - 111 mmol/L   CO2 22 22 - 32 mmol/L   Glucose, Bld 112 (H) 70 - 99 mg/dL   BUN 28 (H) 8 - 23 mg/dL   Creatinine, Ser 1.43 (H) 0.44 - 1.00 mg/dL   Calcium 8.1 (L) 8.9 - 10.3 mg/dL   GFR calc non Af Amer 38 (L) >60 mL/min   GFR calc Af Amer 44 (L) >60 mL/min   Anion gap 9 5 - 15    Comment: Performed at [facility], [place] [address] [place], [place] [address]  Troponin I (High  Sensitivity)     Status: None   Collection Time: 03/31/19 10:52 PM  Result Value Ref Range   Troponin I (High Sensitivity) 9 <18 ng/L    Comment: (NOTE) Elevated high sensitivity troponin I (hsTnI) values and significant  changes across serial measurements may suggest ACS but many other  chronic and acute conditions are known to elevate hsTnI results.  Refer to the Links section for chest pain algorithms and additional  guidance. Performed at [facility], [place] [address], [place], [place] [address]   SARS Coronavirus 2 [facility] order, Performed in [facility] hospital lab) Nasopharyngeal Nasopharyngeal Swab     Status: None   Collection Time: 04/01/19 12:53 AM   Specimen: Nasopharyngeal Swab  Result Value Ref Range   SARS Coronavirus 2 NEGATIVE NEGATIVE    Comment: (NOTE) If result is NEGATIVE SARS-CoV-2 target nucleic acids are NOT DETECTED. The SARS-CoV-2 RNA is generally detectable in upper and lower  respiratory specimens during the acute phase of infection. The lowest  concentration of SARS-CoV-2 viral copies this assay can detect is 250  copies / mL. A negative result does not preclude SARS-CoV-2 infection  and should not be used as the sole basis for treatment or other  patient management decisions.  A negative result may occur with  improper specimen collection / handling, submission of specimen other  than nasopharyngeal swab, presence of viral mutation(s) within the  areas targeted by this assay, and inadequate number of viral copies  (<250 copies / mL). A negative result must be combined with clinical  observations, patient history, and epidemiological information. If result is POSITIVE SARS-CoV-2 target nucleic acids are DETECTED. The SARS-CoV-2 RNA is generally detectable in upper and lower  respiratory specimens dur ing the acute phase of infection.  Positive  results are indicative of active infection with SARS-CoV-2.  Clinical  correlation  with patient history and other diagnostic information is  necessary to determine patient infection status.  Positive results do  not rule out bacterial infection or co-infection with other viruses. If result is PRESUMPTIVE POSTIVE SARS-CoV-2 nucleic acids MAY BE PRESENT.   A presumptive positive result was obtained on the submitted specimen  and confirmed on repeat testing.  While 2019 novel coronavirus  (SARS-CoV-2) nucleic acids  may be present in the submitted sample  additional confirmatory testing may be necessary for epidemiological  and / or clinical management purposes  to differentiate between  SARS-CoV-2 and other Sarbecovirus currently known to infect humans.  If clinically indicated additional testing with an alternate test  methodology [phone]) is advised. The SARS-CoV-2 RNA is generally  detectable in upper and lower respiratory sp ecimens during the acute  phase of infection. The expected result is Negative. Fact Sheet for Patients:  StrictlyIdeas.no Fact Sheet for Healthcare Providers: BankingDealers.co.za This test is not yet approved or cleared by the [place] FDA and has been authorized for detection and/or diagnosis of SARS-CoV-2 by FDA under an Emergency Use Authorization (EUA).  This EUA will remain in effect (meaning this test can be used) for the duration of the COVID-19 declaration under Section 564(b)(1) of the Act, 21 U.S.C. section 360bbb-3(b)(1), unless the authorization is terminated or revoked sooner. Performed at [facility], [place] [address], [place], [place] [address]   POC occult blood, ED Provider will collect     Status: None   Collection Time: 04/01/19  1:16 AM  Result Value Ref Range   Fecal Occult Bld NEGATIVE NEGATIVE   Dg Chest 2 View  Result Date: 03/31/2019 CLINICAL DATA:  Shortness of breath EXAM: CHEST - 2 VIEW COMPARISON:  October 31, 2017 FINDINGS: There is elevation  of the right hemidiaphragm. Streaky opacity seen at the right lung base. The left lung is clear. The cardiomediastinal silhouette is unchanged. Patient is status post right total shoulder arthroplasty with overlying subcutaneous emphysema. IMPRESSION: 1. Elevation of the right hemidiaphragm. 2. Streaky opacity at the right lung base which could be due to atelectasis and/or aspiration. Electronically Signed   By: Prudencio Pair M.D.   On: 03/31/2019 20:07   Ct Angio Chest Pe W/cm &/or Wo Cm  Result Date: 03/31/2019 CLINICAL DATA:  68 year old female with shortness of breath. Status post right shoulder surgery. EXAM: CT ANGIOGRAPHY CHEST WITH CONTRAST TECHNIQUE: Multidetector CT imaging of the chest was performed using the standard protocol during bolus administration of intravenous contrast. Multiplanar CT image reconstructions and MIPs were obtained to evaluate the vascular anatomy. CONTRAST:  35mL OMNIPAQUE IOHEXOL 350 MG/ML SOLN COMPARISON:  Chest radiograph dated 03/31/2019 FINDINGS: Evaluation of this exam is limited due to respiratory motion artifact. Cardiovascular: There is mild cardiomegaly. No pericardial effusion. Mild atherosclerotic calcification of the thoracic aorta. No aneurysmal dilatation or dissection. The visualized origins of the great vessels of the aortic arch appear patent. Left carotid bulb calcified plaques noted. Evaluation of the pulmonary arteries is limited due to respiratory motion artifact and suboptimal visualization of the peripheral branches. No pulmonary artery embolus identified. Mediastinum/Nodes: There is no hilar or mediastinal adenopathy. The esophagus is grossly unremarkable. There is a 5 mm calcified nodule in the left lobe of the thyroid gland. No mediastinal fluid collection. Lungs/Pleura: There is eventration of the right hemidiaphragm. Patchy area of consolidative change at the right lung base may represent atelectasis. Pneumonia or aspiration is not excluded. Clinical  correlation is recommended. There is a background of emphysema. There is no pleural effusion or pneumothorax. The central airways remain patent. Upper Abdomen: Apparent fatty infiltration of the liver. The visualized upper abdomen is otherwise unremarkable. Musculoskeletal: Postsurgical changes of right shoulder arthroplasty. There are pockets of air in the superficial soft tissues of the right axilla and right chest wall. No drainable fluid collection identified. No acute osseous pathology. Review of the MIP images  confirms the above findings. IMPRESSION: 1. No CT evidence of central pulmonary artery embolus. 2. Right lung base atelectasis versus infiltrate. Clinical correlation is recommended. 3. Emphysema. 4. Postsurgical changes of right shoulder arthroplasty. 5. Aortic Atherosclerosis (ICD10-I70.0) and Emphysema (ICD10-J43.9). Electronically Signed   By: Anner Crete M.D.   On: 03/31/2019 23:48   Dg Shoulder Right Port  Result Date: 03/30/2019 CLINICAL DATA:  Status post right shoulder arthroplasty. EXAM: PORTABLE RIGHT SHOULDER COMPARISON:  Radiographs of the right shoulder 10/19/2018 FINDINGS: There has been interval right shoulder arthroplasty. The humeral and glenoid components appear well seated. No adverse features. Degenerative change of the acromioclavicular joint. Ill-defined opacity at the right lung base may reflect atelectasis. IMPRESSION: Interval right shoulder arthroplasty without adverse features. Ill-defined opacity at the right lung base may reflect atelectasis. Pneumonia is difficult to exclude. Electronically Signed   By: Kellie Simmering   On: 03/30/2019 12:56    Pending Labs Unresulted Labs (From admission, onward)   None      Vitals/Pain Today's Vitals   04/01/19 0200 04/01/19 0300 04/01/19 0400 04/01/19 0500  BP: 121/69 (!) 114/54 128/81 124/86  Pulse: 94 92 91 90  Resp: (!) 22 18 17 19   Temp:      TempSrc:      SpO2: 95% 98% 95% 95%  Weight:      Height:       PainSc:        Isolation Precautions No active isolations  Medications Medications  sodium chloride 0.9 % bolus 500 mL (0 mLs Intravenous Stopped 04/01/19 0036)  meclizine (ANTIVERT) tablet 25 mg (25 mg Oral Given 03/31/19 2115)  sodium chloride (PF) 0.9 % injection (  Given by Other 03/31/19 2317)  iohexol (OMNIPAQUE) 350 MG/ML injection 100 mL (80 mLs Intravenous Contrast Given 03/31/19 2317)  cefTRIAXone (ROCEPHIN) 1 g in sodium chloride 0.9 % 100 mL IVPB (0 g Intravenous Stopped 04/01/19 0202)  azithromycin (ZITHROMAX) 500 mg in sodium chloride 0.9 % 250 mL IVPB (0 mg Intravenous Stopped 04/01/19 0355)    Mobility walks

## 2019-04-01 NOTE — H&P (Signed)
History and Physical    Sheryl [name] PPI:[id number] DOB: [birth year] DOA: 03/31/2019  PCP: Binnie Rail, MD  Patient coming from: Home.  Chief Complaint: Shortness of breath.  HPI: Sheryl [name] is a 68 y.o. female with history of COPD, ADD, depression, chronic kidney disease, hyperlipidemia presents to the ER because of shortness of breath and cough productive of sputum since had a surgery 2 days ago and was discharged yesterday.  Patient states since then patient started having shortness of breath with minimal exertion with productive cough denies any fever chills.  Has not noticed any blood in the stools or any bleeding.  ED Course: In the ER patient was becoming hypoxic with minimal exertion.  CT angiogram of the chest shows features concerning for pneumonia.  COVID-19 test was negative.  In addition patient is found to have significant drop of hemoglobin from 12-8.5 or 6 days.  Stool for occult blood was negative.  On exam patient is not wheezing.  Has a current productive cough.  Patient is started on empiric antibiotics for pneumonia and admitted for acute respiratory failure.  EKG shows normal sinus rhythm.  Review of Systems: As per HPI, rest all negative.   Past Medical History:  Diagnosis Date   ADD (attention deficit disorder)    Allergy    Arthritis    CKD (chronic kidney disease), stage III (HCC)    COPD (chronic obstructive pulmonary disease) ([place]) 12/09/2014   Depression    Does use hearing aid    Dyspnea    on exertion   Hyperlipidemia    Hypertension    LGSIL (low grade squamous intraepithelial dysplasia)    Osteoporosis    Pre-diabetes     Past Surgical History:  Procedure Laterality Date   JOINT REPLACEMENT     right knee   MULTIPLE TOOTH EXTRACTIONS     REPLACEMENT TOTAL KNEE  2013   right   REVERSE SHOULDER ARTHROPLASTY Right 03/30/2019   Procedure: REVERSE TOTAL SHOULDER ARTHROPLASTY;  Surgeon: Netta Cedars, MD;  Location: [facility];  Service: Orthopedics;  Laterality: Right;  interscalene block   TOTAL KNEE ARTHROPLASTY Left 12/27/2017   TOTAL KNEE ARTHROPLASTY Left 12/27/2017   Procedure: LEFT TOTAL KNEE ARTHROPLASTY;  Surgeon: Netta Cedars, MD;  Location: [facility];  Service: Orthopedics;  Laterality: Left;     reports that she has been smoking cigarettes. She has a 12.25 pack-year smoking history. She has never used smokeless tobacco. She reports that she does not drink alcohol or use drugs.  Allergies  Allergen Reactions   Macrobid [Nitrofurantoin Monohyd Macro] Nausea And Vomiting    GI upset    Family History  Problem Relation Age of Onset   Hyperlipidemia Mother    Alcohol abuse Father    Arthritis Father    Colon polyps Father    Alcohol abuse Maternal Uncle    Stroke Maternal Grandmother     Prior to Admission medications   Medication Sig Start Date End Date Taking? Authorizing Provider  albuterol (PROVENTIL HFA;VENTOLIN HFA) 108 (90 Base) MCG/ACT inhaler Inhale 1-2 puffs into the lungs every 6 (six) hours as needed for wheezing or shortness of breath. 03/22/16  Yes Philis Fendt L, PA-C  ANORO ELLIPTA 62.5-25 MCG/INH AEPB TAKE 1 PUFF BY MOUTH EVERY DAY Patient taking differently: Inhale 1 puff into the lungs daily.  08/11/18  Yes Binnie Rail, MD  aspirin 81 MG chewable tablet Chew 162 mg by mouth daily.   Yes [provider]  atomoxetine (STRATTERA) 100 MG capsule TAKE 1 CAPSULE BY MOUTH EVERY DAY Patient taking differently: Take 100 mg by mouth daily.  11/12/18  Yes Burns, Claudina Lick, MD  FLUoxetine (PROZAC) 20 MG capsule TAKE 1 CAPSULE (20 MG TOTAL) BY MOUTH DAILY IN ADDITION TO 40 MG FOR A TOTAL OF 60 MG Patient taking differently: Take 20 mg by mouth daily. In addition to 40 mg for a total of 60 mg 03/06/19  Yes Burns, Claudina Lick, MD  FLUoxetine (PROZAC) 40 MG capsule Take 1 capsule (40 mg total) by mouth daily. In addition to 20 mg for total of 60 mg daily 11/13/18  Yes Burns, Claudina Lick, MD  hydrochlorothiazide (HYDRODIURIL) 12.5 MG tablet Take 1 tablet (12.5 mg total) by mouth daily. 12/18/18  Yes Burns, Claudina Lick, MD  HYDROcodone-acetaminophen (NORCO) 5-325 MG tablet Take 1 tablet by mouth every 4 (four) hours as needed for moderate pain or severe pain. 03/30/19  Yes Netta Cedars, MD  losartan (COZAAR) 100 MG tablet Take 1 tablet (100 mg total) by mouth daily. 12/18/18  Yes Burns, Claudina Lick, MD  rosuvastatin (CRESTOR) 10 MG tablet Take 1 tablet (10 mg total) by mouth daily. 12/18/18  Yes Burns, Claudina Lick, MD  traMADol (ULTRAM) 50 MG tablet Take 1 tablet (50 mg total) by mouth every 6 (six) hours as needed. Patient taking differently: Take 50 mg by mouth every 6 (six) hours as needed for moderate pain.  10/19/18  Yes Domenic Moras, PA-C  traZODone (DESYREL) 50 MG tablet TAKE 1 TABLET BY MOUTH EVERYDAY AT BEDTIME Patient taking differently: Take 50 mg by mouth at bedtime.  12/19/18  Yes Burns, Claudina Lick, MD  TURMERIC PO Take 1,000 mg by mouth daily.    Yes [provider]  ZINC PICOLINATE PO Take 50 mg by mouth daily.   Yes [provider]  meclizine (ANTIVERT) 25 MG tablet Take 1 tablet (25 mg total) by mouth 3 (three) times daily as needed for dizziness. 06/05/18   Binnie Rail, MD    Physical Exam: Constitutional: Moderately built and nourished. Vitals:   04/01/19 0200 04/01/19 0300 04/01/19 0400 04/01/19 0500  BP: 121/69 (!) 114/54 128/81 124/86  Pulse: 94 92 91 90  Resp: (!) 22 18 17 19   Temp:      TempSrc:      SpO2: 95% 98% 95% 95%  Weight:      Height:       Eyes: Anicteric no pallor. ENMT: No discharge from the ears eyes nose or mouth. Neck: No mass felt.  No neck rigidity. Respiratory: No rhonchi or crepitations. Cardiovascular: S1-S2 heard. Abdomen: Soft nontender bowel sounds present. Musculoskeletal: No edema.  No joint effusion.  Right shoulder in sling. Skin: No rash. Neurologic: Alert awake oriented to time place and person.  Moves all  extremities. Psychiatric: Appears normal per normal affect.   Labs on Admission: I have personally reviewed following labs and imaging studies  CBC: Recent Labs  Lab 03/26/19 1604 03/31/19 0222 03/31/19 2252  WBC 9.7  --  11.1*  NEUTROABS  --   --  7.3  HGB 12.0 9.4* 8.5*  HCT 38.3 30.2* 26.4*  MCV 94.8  --  94.3  PLT 383  --  725   Basic Metabolic Panel: Recent Labs  Lab 03/26/19 1604 03/31/19 0222 03/31/19 2252  NA 141 133* 134*  K 4.0 4.1 3.2*  CL 107 103 103  CO2 25 22 22   GLUCOSE 99 118* 112*  BUN 21 26* 28*  CREATININE 1.18* 1.29* 1.43*  CALCIUM 9.4 8.0* 8.1*   GFR: Estimated Creatinine Clearance: 31.8 mL/min (A) (by C-G formula based on SCr of 1.43 mg/dL (H)). Liver Function Tests: No results for input(s): AST, ALT, ALKPHOS, BILITOT, PROT, ALBUMIN in the last 168 hours. No results for input(s): LIPASE, AMYLASE in the last 168 hours. No results for input(s): AMMONIA in the last 168 hours. Coagulation Profile: No results for input(s): INR, PROTIME in the last 168 hours. Cardiac Enzymes: No results for input(s): CKTOTAL, CKMB, CKMBINDEX, TROPONINI in the last 168 hours. BNP (last 3 results) No results for input(s): PROBNP in the last 8760 hours. HbA1C: No results for input(s): HGBA1C in the last 72 hours. CBG: No results for input(s): GLUCAP in the last 168 hours. Lipid Profile: No results for input(s): CHOL, HDL, LDLCALC, TRIG, CHOLHDL, LDLDIRECT in the last 72 hours. Thyroid Function Tests: No results for input(s): TSH, T4TOTAL, FREET4, T3FREE, THYROIDAB in the last 72 hours. Anemia Panel: No results for input(s): VITAMINB12, FOLATE, FERRITIN, TIBC, IRON, RETICCTPCT in the last 72 hours. Urine analysis:    Component Value Date/Time   COLORURINE YELLOW 07/02/2017 1016   APPEARANCEUR CLEAR 07/02/2017 1016   LABSPEC 1.020 07/02/2017 1016   PHURINE 7.0 07/02/2017 1016   GLUCOSEU NEGATIVE 07/02/2017 1016   HGBUR NEGATIVE 07/02/2017 1016   BILIRUBINUR  NEGATIVE 07/02/2017 1016   BILIRUBINUR negative 02/18/2017 1121   BILIRUBINUR neg 04/29/2015 1431   KETONESUR NEGATIVE 07/02/2017 1016   PROTEINUR negative 02/18/2017 1121   PROTEINUR NEGATIVE 03/05/2016 1559   UROBILINOGEN 0.2 07/02/2017 1016   NITRITE NEGATIVE 07/02/2017 1016   LEUKOCYTESUR NEGATIVE 07/02/2017 1016   Sepsis Labs: @LABRCNTIP (procalcitonin:4,lacticidven:4) ) Recent Results (from the past 240 hour(s))  SARS CORONAVIRUS 2 Nasal Swab Aptima Multi Swab     Status: None   Collection Time: 03/26/19  3:30 PM   Specimen: Aptima Multi Swab; Nasal Swab  Result Value Ref Range Status   SARS Coronavirus 2 NEGATIVE NEGATIVE Final    Comment: (NOTE) SARS-CoV-2 target nucleic acids are NOT DETECTED. The SARS-CoV-2 RNA is generally detectable in upper and lower respiratory specimens during the acute phase of infection. Negative results do not preclude SARS-CoV-2 infection, do not rule out co-infections with other pathogens, and should not be used as the sole basis for treatment or other patient management decisions. Negative results must be combined with clinical observations, patient history, and epidemiological information. The expected result is Negative. Fact Sheet for Patients: SugarRoll.be Fact Sheet for Healthcare Providers: https://www.woods-mathews.com/ This test is not yet approved or cleared by the [place] FDA and  has been authorized for detection and/or diagnosis of SARS-CoV-2 by FDA under an Emergency Use Authorization (EUA). This EUA will remain  in effect (meaning this test can be used) for the duration of the COVID-19 declaration under Section 56 4(b)(1) of the Act, 21 U.S.C. section 360bbb-3(b)(1), unless the authorization is terminated or revoked sooner. Performed at [facility], [place] [address] [place], [place] [address]   Surgical pcr screen     Status: None   Collection Time: 03/26/19  3:36 PM    Specimen: Nasal Mucosa; Nasal Swab  Result Value Ref Range Status   MRSA, PCR NEGATIVE NEGATIVE Final   Staphylococcus aureus NEGATIVE NEGATIVE Final    Comment: (NOTE) The Xpert SA Assay (FDA approved for NASAL specimens in patients 64 years of age and older), is one component of a comprehensive surveillance program. It is not intended to  diagnose infection nor to guide or monitor treatment. Performed at [facility], [place] [address], [place], [place] [address]   SARS Coronavirus 2 [facility] order, Performed in [facility] hospital lab) Nasopharyngeal Nasopharyngeal Swab     Status: None   Collection Time: 04/01/19 12:53 AM   Specimen: Nasopharyngeal Swab  Result Value Ref Range Status   SARS Coronavirus 2 NEGATIVE NEGATIVE Final    Comment: (NOTE) If result is NEGATIVE SARS-CoV-2 target nucleic acids are NOT DETECTED. The SARS-CoV-2 RNA is generally detectable in upper and lower  respiratory specimens during the acute phase of infection. The lowest  concentration of SARS-CoV-2 viral copies this assay can detect is 250  copies / mL. A negative result does not preclude SARS-CoV-2 infection  and should not be used as the sole basis for treatment or other  patient management decisions.  A negative result may occur with  improper specimen collection / handling, submission of specimen other  than nasopharyngeal swab, presence of viral mutation(s) within the  areas targeted by this assay, and inadequate number of viral copies  (<250 copies / mL). A negative result must be combined with clinical  observations, patient history, and epidemiological information. If result is POSITIVE SARS-CoV-2 target nucleic acids are DETECTED. The SARS-CoV-2 RNA is generally detectable in upper and lower  respiratory specimens dur ing the acute phase of infection.  Positive  results are indicative of active infection with SARS-CoV-2.  Clinical  correlation with patient history and  other diagnostic information is  necessary to determine patient infection status.  Positive results do  not rule out bacterial infection or co-infection with other viruses. If result is PRESUMPTIVE POSTIVE SARS-CoV-2 nucleic acids MAY BE PRESENT.   A presumptive positive result was obtained on the submitted specimen  and confirmed on repeat testing.  While 2019 novel coronavirus  (SARS-CoV-2) nucleic acids may be present in the submitted sample  additional confirmatory testing may be necessary for epidemiological  and / or clinical management purposes  to differentiate between  SARS-CoV-2 and other Sarbecovirus currently known to infect humans.  If clinically indicated additional testing with an alternate test  methodology [id number]) is advised. The SARS-CoV-2 RNA is generally  detectable in upper and lower respiratory sp ecimens during the acute  phase of infection. The expected result is Negative. Fact Sheet for Patients:  StrictlyIdeas.no Fact Sheet for Healthcare Providers: BankingDealers.co.za This test is not yet approved or cleared by the [place] FDA and has been authorized for detection and/or diagnosis of SARS-CoV-2 by FDA under an Emergency Use Authorization (EUA).  This EUA will remain in effect (meaning this test can be used) for the duration of the COVID-19 declaration under Section 564(b)(1) of the Act, 21 U.S.C. section 360bbb-3(b)(1), unless the authorization is terminated or revoked sooner. Performed at [facility], [place] [address] [place], [place] [address]      Radiological Exams on Admission: Dg Chest 2 View  Result Date: 03/31/2019 CLINICAL DATA:  Shortness of breath EXAM: CHEST - 2 VIEW COMPARISON:  October 31, 2017 FINDINGS: There is elevation of the right hemidiaphragm. Streaky opacity seen at the right lung base. The left lung is clear. The cardiomediastinal silhouette is unchanged.  Patient is status post right total shoulder arthroplasty with overlying subcutaneous emphysema. IMPRESSION: 1. Elevation of the right hemidiaphragm. 2. Streaky opacity at the right lung base which could be due to atelectasis and/or aspiration. Electronically Signed   By: Prudencio Pair M.D.   On:  03/31/2019 20:07   Ct Angio Chest Pe W/cm &/or Wo Cm  Result Date: 03/31/2019 CLINICAL DATA:  68 year old female with shortness of breath. Status post right shoulder surgery. EXAM: CT ANGIOGRAPHY CHEST WITH CONTRAST TECHNIQUE: Multidetector CT imaging of the chest was performed using the standard protocol during bolus administration of intravenous contrast. Multiplanar CT image reconstructions and MIPs were obtained to evaluate the vascular anatomy. CONTRAST:  19mL OMNIPAQUE IOHEXOL 350 MG/ML SOLN COMPARISON:  Chest radiograph dated 03/31/2019 FINDINGS: Evaluation of this exam is limited due to respiratory motion artifact. Cardiovascular: There is mild cardiomegaly. No pericardial effusion. Mild atherosclerotic calcification of the thoracic aorta. No aneurysmal dilatation or dissection. The visualized origins of the great vessels of the aortic arch appear patent. Left carotid bulb calcified plaques noted. Evaluation of the pulmonary arteries is limited due to respiratory motion artifact and suboptimal visualization of the peripheral branches. No pulmonary artery embolus identified. Mediastinum/Nodes: There is no hilar or mediastinal adenopathy. The esophagus is grossly unremarkable. There is a 5 mm calcified nodule in the left lobe of the thyroid gland. No mediastinal fluid collection. Lungs/Pleura: There is eventration of the right hemidiaphragm. Patchy area of consolidative change at the right lung base may represent atelectasis. Pneumonia or aspiration is not excluded. Clinical correlation is recommended. There is a background of emphysema. There is no pleural effusion or pneumothorax. The central airways remain  patent. Upper Abdomen: Apparent fatty infiltration of the liver. The visualized upper abdomen is otherwise unremarkable. Musculoskeletal: Postsurgical changes of right shoulder arthroplasty. There are pockets of air in the superficial soft tissues of the right axilla and right chest wall. No drainable fluid collection identified. No acute osseous pathology. Review of the MIP images confirms the above findings. IMPRESSION: 1. No CT evidence of central pulmonary artery embolus. 2. Right lung base atelectasis versus infiltrate. Clinical correlation is recommended. 3. Emphysema. 4. Postsurgical changes of right shoulder arthroplasty. 5. Aortic Atherosclerosis (ICD10-I70.0) and Emphysema (ICD10-J43.9). Electronically Signed   By: Anner Crete M.D.   On: 03/31/2019 23:48   Dg Shoulder Right Port  Result Date: 03/30/2019 CLINICAL DATA:  Status post right shoulder arthroplasty. EXAM: PORTABLE RIGHT SHOULDER COMPARISON:  Radiographs of the right shoulder 10/19/2018 FINDINGS: There has been interval right shoulder arthroplasty. The humeral and glenoid components appear well seated. No adverse features. Degenerative change of the acromioclavicular joint. Ill-defined opacity at the right lung base may reflect atelectasis. IMPRESSION: Interval right shoulder arthroplasty without adverse features. Ill-defined opacity at the right lung base may reflect atelectasis. Pneumonia is difficult to exclude. Electronically Signed   By: Kellie Simmering   On: 03/30/2019 12:56    EKG: Independently reviewed.  Normal sinus rhythm.  Assessment/Plan Principal Problem:   Acute respiratory failure with hypoxia (HCC) Active Problems:   HTN (hypertension)   ADD (attention deficit disorder)   COPD (chronic obstructive pulmonary disease) (HCC)   CKD (chronic kidney disease)   Community acquired pneumonia   Acute blood loss anemia    1. Acute respiratory failure with hypoxia likely combination of pneumonia and acute blood loss  anemia.  BNP and troponin pending.  Patient afebrile. 2. Pneumonia -place patient on antibiotics ceftriaxone and Zithromax check sputum cultures urine for Legionella and strep antigen. 3. Acute blood loss anemia likely contributing to patient's symptoms.  Follow CBC.  If hemoglobin is less than 7 may need transfusion. 4. COPD not actively wheezing continue inhalers. 5. Chronic kidney disease stage III creatinine appears to be at baseline.  Note that patient is  on ARB. 6. Hypertension we will continue ARB but hold hydrochlorothiazide since patient has mild hypokalemia.  Will replace potassium and then restart hydrochlorothiazide.  PRN IV hydralazine. 7. Hyperlipidemia on statins. 8. ADD on Strattera. 9. Depression on fluoxetine. 10. Recent right shoulder surgery may discuss with Dr. Veverly Fells in the morning. 11. Mild hypokalemia replace and recheck.   DVT prophylaxis: SCDs for now may change to Lovenox based on orthopedic input. Code Status: Full code. Family Communication: Discussed with patient. Disposition Plan: Home. Consults called: None. Admission status: Observation.   Rise Patience MD [facility] Pager [phone].  If 7PM-7AM, please contact night-coverage [email or web address] Password [email or web address]  04/01/2019, 5:54 AM

## 2019-04-01 NOTE — Progress Notes (Signed)
PROGRESS NOTE    Sheryl [name]  MGQ:[id number] DOB: January 24, 1951 DOA: 03/31/2019 PCP: Binnie Rail, MD   Brief Narrative: 68 year old with past medical history significant for COPD, ADD, depression, chronic kidney disease, hyperlipidemia who presents to the ER with complaint of shortness of breath and cough that is started after she had surgery right shoulder rotator cuff tear arthropathy.  In the ER patient was becoming hypoxic with minimal exertion.  CT angiogram showed fissure concerning for with pneumonia.  Was negative for PE.  COVID-19 test negative.  Stool for occult blood was negative.  Assessment & Plan:   Principal Problem:   Acute respiratory failure with hypoxia (HCC) Active Problems:   HTN (hypertension)   ADD (attention deficit disorder)   COPD (chronic obstructive pulmonary disease) (HCC)   CKD (chronic kidney disease)   Community acquired pneumonia   Acute blood loss anemia   1-Acute hypoxic respiratory failure likely secondary to pneumonia and acute blood loss anemia. Continue with supplemental oxygen. Repeat hemoglobin tomorrow morning if less than 8 will proceed with blood transfusion. Treat for pneumonia.  2-Pneumonia: Continue with ceftriaxone and azithromycin. Legionella and strep antigen ordered. Sputum culture ordered.  3-Acute blood loss anemia: Transfuse if hemoglobin less than 7. Check anemia panel. Will give IV iron.  B 12 injection.   COPD: Continue with inhalers.  Chronic kidney disease a stage III: Monitor on our  Hypertension: Continue with our hold hydrochlorothiazide.  Hyperlipidemia: Continue with the statins.  Rotator cuff sx;  Dr Alma Friendly informed of admission.        Estimated body mass index is 27.08 kg/m as calculated from the following:   Height as of this encounter: 5\' 1"  (1.549 m).   Weight as of this encounter: 65 kg.   DVT prophylaxis: aspirin Code Status: full code Family Communication: care discussed with  patient.  Disposition Plan: remain in the hospital for treatment of PNA Consultants:   ortho  Procedures:   none  Antimicrobials:  Ceftriaxone, azithro  Subjective: Alert , report dyspnea on exertion.  Report cough, dyspnea  Objective: Vitals:   04/01/19 0300 04/01/19 0400 04/01/19 0500 04/01/19 0644  BP: (!) 114/54 128/81 124/86 (!) 127/58  Pulse: 92 91 90 88  Resp: 18 17 19 16   Temp:    98.1 F (36.7 C)  TempSrc:    Oral  SpO2: 98% 95% 95% 96%  Weight:    65 kg  Height:    5\' 1"  (1.549 m)    Intake/Output Summary (Last 24 hours) at 04/01/2019 0912 Last data filed at 04/01/2019 0600 Gross per 24 hour  Intake 850 ml  Output -  Net 850 ml   Filed Weights   03/31/19 1820 03/31/19 1828 04/01/19 0644  Weight: 65 kg 62.1 kg 65 kg    Examination:  General exam: Appears calm and comfortable  Respiratory system: BL ronchus. Marland Kitchen Respiratory effort normal. Cardiovascular system: S1 & S2 heard, RRR. No JVD, murmurs, rubs, gallops or clicks. No pedal edema. Gastrointestinal system: Abdomen is nondistended, soft and nontender. No organomegaly or masses felt. Normal bowel sounds heard. Central nervous system: Alert and oriented. No focal neurological deficits. Extremities: Symmetric 5 x 5 power. Skin: No rashes, lesions or ulcers Psychiatry: Judgement and insight appear normal. Mood & affect appropriate.     Data Reviewed: I have personally reviewed following labs and imaging studies  CBC: Recent Labs  Lab 03/26/19 1604 03/31/19 0222 03/31/19 2252 04/01/19 0731  WBC 9.7  --  11.1* 9.5  NEUTROABS  --   --  7.3 7.3  HGB 12.0 9.4* 8.5* 8.1*  HCT 38.3 30.2* 26.4* 25.0*  MCV 94.8  --  94.3 93.3  PLT 383  --  289 878   Basic Metabolic Panel: Recent Labs  Lab 03/26/19 1604 03/31/19 0222 03/31/19 2252 04/01/19 0731  NA 141 133* 134* 136  K 4.0 4.1 3.2* 3.3*  CL 107 103 103 106  CO2 25 22 22  21*  GLUCOSE 99 118* 112* 124*  BUN 21 26* 28* 20  CREATININE 1.18*  1.29* 1.43* 0.91  CALCIUM 9.4 8.0* 8.1* 7.6*  MG  --   --   --  2.0   GFR: Estimated Creatinine Clearance: 51.1 mL/min (by C-G formula based on SCr of 0.91 mg/dL). Liver Function Tests: Recent Labs  Lab 04/01/19 0731  AST 45*  ALT 23  ALKPHOS 107  BILITOT 0.3  PROT 6.1*  ALBUMIN 2.7*   No results for input(s): LIPASE, AMYLASE in the last 168 hours. No results for input(s): AMMONIA in the last 168 hours. Coagulation Profile: No results for input(s): INR, PROTIME in the last 168 hours. Cardiac Enzymes: No results for input(s): CKTOTAL, CKMB, CKMBINDEX, TROPONINI in the last 168 hours. BNP (last 3 results) No results for input(s): PROBNP in the last 8760 hours. HbA1C: No results for input(s): HGBA1C in the last 72 hours. CBG: No results for input(s): GLUCAP in the last 168 hours. Lipid Profile: No results for input(s): CHOL, HDL, LDLCALC, TRIG, CHOLHDL, LDLDIRECT in the last 72 hours. Thyroid Function Tests: No results for input(s): TSH, T4TOTAL, FREET4, T3FREE, THYROIDAB in the last 72 hours. Anemia Panel: No results for input(s): VITAMINB12, FOLATE, FERRITIN, TIBC, IRON, RETICCTPCT in the last 72 hours. Sepsis Labs: No results for input(s): PROCALCITON, LATICACIDVEN in the last 168 hours.  Recent Results (from the past 240 hour(s))  SARS CORONAVIRUS 2 Nasal Swab Aptima Multi Swab     Status: None   Collection Time: 03/26/19  3:30 PM   Specimen: Aptima Multi Swab; Nasal Swab  Result Value Ref Range Status   SARS Coronavirus 2 NEGATIVE NEGATIVE Final    Comment: (NOTE) SARS-CoV-2 target nucleic acids are NOT DETECTED. The SARS-CoV-2 RNA is generally detectable in upper and lower respiratory specimens during the acute phase of infection. Negative results do not preclude SARS-CoV-2 infection, do not rule out co-infections with other pathogens, and should not be used as the sole basis for treatment or other patient management decisions. Negative results must be combined  with clinical observations, patient history, and epidemiological information. The expected result is Negative. Fact Sheet for Patients: SugarRoll.be Fact Sheet for Healthcare Providers: https://www.woods-mathews.com/ This test is not yet approved or cleared by the [place] FDA and  has been authorized for detection and/or diagnosis of SARS-CoV-2 by FDA under an Emergency Use Authorization (EUA). This EUA will remain  in effect (meaning this test can be used) for the duration of the COVID-19 declaration under Section 56 4(b)(1) of the Act, 21 U.S.C. section 360bbb-3(b)(1), unless the authorization is terminated or revoked sooner. Performed at [facility], [place] [address], [place], [place] [address]   Surgical pcr screen     Status: None   Collection Time: 03/26/19  3:36 PM   Specimen: Nasal Mucosa; Nasal Swab  Result Value Ref Range Status   MRSA, PCR NEGATIVE NEGATIVE Final   Staphylococcus aureus NEGATIVE NEGATIVE Final    Comment: (NOTE) The Xpert SA Assay (FDA approved for NASAL specimens in patients  71 years of age and older), is one component of a comprehensive surveillance program. It is not intended to diagnose infection nor to guide or monitor treatment. Performed at [facility], [place] [address], [place], [place] [address]   SARS Coronavirus 2 [facility] order, Performed in [facility] hospital lab) Nasopharyngeal Nasopharyngeal Swab     Status: None   Collection Time: 04/01/19 12:53 AM   Specimen: Nasopharyngeal Swab  Result Value Ref Range Status   SARS Coronavirus 2 NEGATIVE NEGATIVE Final    Comment: (NOTE) If result is NEGATIVE SARS-CoV-2 target nucleic acids are NOT DETECTED. The SARS-CoV-2 RNA is generally detectable in upper and lower  respiratory specimens during the acute phase of infection. The lowest  concentration of SARS-CoV-2 viral copies this assay can detect is 250  copies / mL. A  negative result does not preclude SARS-CoV-2 infection  and should not be used as the sole basis for treatment or other  patient management decisions.  A negative result may occur with  improper specimen collection / handling, submission of specimen other  than nasopharyngeal swab, presence of viral mutation(s) within the  areas targeted by this assay, and inadequate number of viral copies  (<250 copies / mL). A negative result must be combined with clinical  observations, patient history, and epidemiological information. If result is POSITIVE SARS-CoV-2 target nucleic acids are DETECTED. The SARS-CoV-2 RNA is generally detectable in upper and lower  respiratory specimens dur ing the acute phase of infection.  Positive  results are indicative of active infection with SARS-CoV-2.  Clinical  correlation with patient history and other diagnostic information is  necessary to determine patient infection status.  Positive results do  not rule out bacterial infection or co-infection with other viruses. If result is PRESUMPTIVE POSTIVE SARS-CoV-2 nucleic acids MAY BE PRESENT.   A presumptive positive result was obtained on the submitted specimen  and confirmed on repeat testing.  While 2019 novel coronavirus  (SARS-CoV-2) nucleic acids may be present in the submitted sample  additional confirmatory testing may be necessary for epidemiological  and / or clinical management purposes  to differentiate between  SARS-CoV-2 and other Sarbecovirus currently known to infect humans.  If clinically indicated additional testing with an alternate test  methodology [phone]) is advised. The SARS-CoV-2 RNA is generally  detectable in upper and lower respiratory sp ecimens during the acute  phase of infection. The expected result is Negative. Fact Sheet for Patients:  StrictlyIdeas.no Fact Sheet for Healthcare Providers: BankingDealers.co.za This test is not  yet approved or cleared by the [place] FDA and has been authorized for detection and/or diagnosis of SARS-CoV-2 by FDA under an Emergency Use Authorization (EUA).  This EUA will remain in effect (meaning this test can be used) for the duration of the COVID-19 declaration under Section 564(b)(1) of the Act, 21 U.S.C. section 360bbb-3(b)(1), unless the authorization is terminated or revoked sooner. Performed at [facility], [place] [address], [place], [place] [address]          Radiology Studies: Dg Chest 2 View  Result Date: 03/31/2019 CLINICAL DATA:  Shortness of breath EXAM: CHEST - 2 VIEW COMPARISON:  October 31, 2017 FINDINGS: There is elevation of the right hemidiaphragm. Streaky opacity seen at the right lung base. The left lung is clear. The cardiomediastinal silhouette is unchanged. Patient is status post right total shoulder arthroplasty with overlying subcutaneous emphysema. IMPRESSION: 1. Elevation of the right hemidiaphragm. 2. Streaky opacity at the right lung  base which could be due to atelectasis and/or aspiration. Electronically Signed   By: Prudencio Pair M.D.   On: 03/31/2019 20:07   Ct Angio Chest Pe W/cm &/or Wo Cm  Result Date: 03/31/2019 CLINICAL DATA:  67 year old female with shortness of breath. Status post right shoulder surgery. EXAM: CT ANGIOGRAPHY CHEST WITH CONTRAST TECHNIQUE: Multidetector CT imaging of the chest was performed using the standard protocol during bolus administration of intravenous contrast. Multiplanar CT image reconstructions and MIPs were obtained to evaluate the vascular anatomy. CONTRAST:  86mL OMNIPAQUE IOHEXOL 350 MG/ML SOLN COMPARISON:  Chest radiograph dated 03/31/2019 FINDINGS: Evaluation of this exam is limited due to respiratory motion artifact. Cardiovascular: There is mild cardiomegaly. No pericardial effusion. Mild atherosclerotic calcification of the thoracic aorta. No aneurysmal dilatation or dissection. The  visualized origins of the great vessels of the aortic arch appear patent. Left carotid bulb calcified plaques noted. Evaluation of the pulmonary arteries is limited due to respiratory motion artifact and suboptimal visualization of the peripheral branches. No pulmonary artery embolus identified. Mediastinum/Nodes: There is no hilar or mediastinal adenopathy. The esophagus is grossly unremarkable. There is a 5 mm calcified nodule in the left lobe of the thyroid gland. No mediastinal fluid collection. Lungs/Pleura: There is eventration of the right hemidiaphragm. Patchy area of consolidative change at the right lung base may represent atelectasis. Pneumonia or aspiration is not excluded. Clinical correlation is recommended. There is a background of emphysema. There is no pleural effusion or pneumothorax. The central airways remain patent. Upper Abdomen: Apparent fatty infiltration of the liver. The visualized upper abdomen is otherwise unremarkable. Musculoskeletal: Postsurgical changes of right shoulder arthroplasty. There are pockets of air in the superficial soft tissues of the right axilla and right chest wall. No drainable fluid collection identified. No acute osseous pathology. Review of the MIP images confirms the above findings. IMPRESSION: 1. No CT evidence of central pulmonary artery embolus. 2. Right lung base atelectasis versus infiltrate. Clinical correlation is recommended. 3. Emphysema. 4. Postsurgical changes of right shoulder arthroplasty. 5. Aortic Atherosclerosis (ICD10-I70.0) and Emphysema (ICD10-J43.9). Electronically Signed   By: Anner Crete M.D.   On: 03/31/2019 23:48   Dg Shoulder Right Port  Result Date: 03/30/2019 CLINICAL DATA:  Status post right shoulder arthroplasty. EXAM: PORTABLE RIGHT SHOULDER COMPARISON:  Radiographs of the right shoulder 10/19/2018 FINDINGS: There has been interval right shoulder arthroplasty. The humeral and glenoid components appear well seated. No adverse  features. Degenerative change of the acromioclavicular joint. Ill-defined opacity at the right lung base may reflect atelectasis. IMPRESSION: Interval right shoulder arthroplasty without adverse features. Ill-defined opacity at the right lung base may reflect atelectasis. Pneumonia is difficult to exclude. Electronically Signed   By: Kellie Simmering   On: 03/30/2019 12:56        Scheduled Meds: . aspirin  162 mg Oral Daily  . atomoxetine  100 mg Oral Daily  . FLUoxetine  60 mg Oral Daily  . losartan  100 mg Oral Daily  . rosuvastatin  10 mg Oral Daily  . traZODone  50 mg Oral QHS  . umeclidinium-vilanterol  1 puff Inhalation Daily   Continuous Infusions: . [START ON 04/02/2019] azithromycin    . cefTRIAXone (ROCEPHIN)  IV       LOS: 0 days    Time spent: 35 minutes   Elmarie Shiley, MD [facility] Pager [phone]  If 7PM-7AM, please contact night-coverage [email or web address] Password [facility] 04/01/2019, 9:12 AM

## 2019-04-01 NOTE — Telephone Encounter (Signed)
Called patient and LVM that nurse was calling to follow-up after her D/C from the hospital. Nurse stated that her instructions stated for her to make a follow-up appointment with Dr. Quay Burow. Nurse requested that the patient call her back (contact number was provided) and nurse will also call patient back tomorrow.

## 2019-04-01 NOTE — ED Notes (Signed)
Patient up to bedside commode without oxygen and SpO2 dropped to 81% on RA. Patient complaining of SOB with exertion. Patient assisted back to bed and Kemp placed back on patient with O2 at 2L per minute. SpO2 improved to 99% and SOB is improving. Patient instructed to remain in bed unless assisted. Will continue to monitor patient.

## 2019-04-02 ENCOUNTER — Other Ambulatory Visit: Payer: Self-pay | Admitting: Internal Medicine

## 2019-04-02 LAB — CBC
HCT: 25.9 % — ABNORMAL LOW (ref 36.0–46.0)
Hemoglobin: 8.3 g/dL — ABNORMAL LOW (ref 12.0–15.0)
MCH: 30.2 pg (ref 26.0–34.0)
MCHC: 32 g/dL (ref 30.0–36.0)
MCV: 94.2 fL (ref 80.0–100.0)
Platelets: 264 10*3/uL (ref 150–400)
RBC: 2.75 MIL/uL — ABNORMAL LOW (ref 3.87–5.11)
RDW: 14 % (ref 11.5–15.5)
WBC: 7.7 10*3/uL (ref 4.0–10.5)
nRBC: 0 % (ref 0.0–0.2)

## 2019-04-02 LAB — BASIC METABOLIC PANEL
Anion gap: 12 (ref 5–15)
BUN: 14 mg/dL (ref 8–23)
CO2: 21 mmol/L — ABNORMAL LOW (ref 22–32)
Calcium: 8.4 mg/dL — ABNORMAL LOW (ref 8.9–10.3)
Chloride: 107 mmol/L (ref 98–111)
Creatinine, Ser: 0.88 mg/dL (ref 0.44–1.00)
GFR calc Af Amer: >60 mL/min (ref >60)
GFR calc non Af Amer: >60 mL/min (ref >60)
Glucose, Bld: 102 mg/dL — ABNORMAL HIGH (ref 70–99)
Potassium: 4.2 mmol/L (ref 3.5–5.1)
Sodium: 140 mmol/L (ref 135–145)

## 2019-04-02 MED ORDER — VITAMIN B-12 100 MCG PO TABS
100.0000 ug | ORAL_TABLET | Freq: Every day | ORAL | Status: DC
  Administered 2019-04-02 – 2019-04-04 (×3): 100 ug via ORAL
  Filled 2019-04-02 (×3): qty 1

## 2019-04-02 NOTE — Evaluation (Signed)
Physical Therapy Evaluation Patient Details Name: Sheryl [name] MRN: [id number] DOB: 18-Aug-1951 Today's Date: 04/02/2019   History of Present Illness  68 year old with past medical history significant for COPD, ADD, depression, chronic kidney disease, hyperlipidemia who presents to the ER with complaint of shortness of breath and cough that is started after she had surgery right shoulder rotator cuff tear arthropathy. Pt worked up for pneumonia    Clinical Impression  Pt walked 80 feet on RA with O2 staying 92%.  Pt educated on breathing strategies and slowing down to get deeper breaths.  Pt did inspirometer for productive coughing.  Pt reports she wasn't breathing this deeply with inspirometer when she went home after surgery.  Pt educated on safely starting increased walking once she gets home.    Follow Up Recommendations No PT follow up    Equipment Recommendations  None recommended by PT    Recommendations for Other Services       Precautions / Restrictions Precautions Precautions: Fall Precaution Comments: pt has had vertigo since spring:  worse at sometimes but she reports it is always present.  pt denies any falls in last 3 months Required Braces or Orthoses: Sling Restrictions Other Position/Activity Restrictions: right arm in sling.  Exercises per OT      Mobility  Bed Mobility Overal bed mobility: Modified Independent             General bed mobility comments: up to L side of bed   Transfers Overall transfer level: Needs assistance   Transfers: Sit to/from Stand Sit to Stand: Supervision         General transfer comment: supervision for safety, pt with hx of vertigo  Ambulation/Gait Ambulation/Gait assistance: Supervision Gait Distance (Feet): 80 Feet Assistive device: None Gait Pattern/deviations: Step-through pattern;WFL(Within Functional Limits)     General Gait Details: pt safe with ambulation - educated on breathing techniques.  educated pt  on  how to start a safe walking program at home and how to build up her Government social research officer [place] (Stroke Patients Only)       Balance Overall balance assessment: No apparent balance deficits (not formally assessed)                                           Pertinent Vitals/Pain Pain Assessment: No/denies pain Faces Pain Scale: Hurts little more Pain Location: R shoulder Pain Descriptors / Indicators: Sore Pain Intervention(s): Repositioned;Monitored during session    Home Living Family/patient expects to be discharged to:: Private residence Living Arrangements: Children Available Help at Discharge: Family;Available 24 hours/day Type of Home: House Home Access: Stairs to enter Entrance Stairs-Rails: Psychiatric nurse of Steps: 3 Home Layout: Two level;Bed/bath upstairs;Full bath on main level Home Equipment: None Additional Comments: doesn't think she still has her shower seat.  She will sponge bathe    Prior Function Level of Independence: Independent               Hand Dominance   Dominant Hand: Left    Extremity/Trunk Assessment   Upper Extremity Assessment Upper Extremity Assessment: Defer to OT evaluation RUE Deficits / Details: performed elbow to hand AROM x 10 RUE: Unable to fully assess due to immobilization RUE Coordination: decreased gross motor    Lower Extremity Assessment Lower Extremity  Assessment: Generalized weakness    Cervical / Trunk Assessment Cervical / Trunk Assessment: Normal  Communication   Communication: HOH  Cognition Arousal/Alertness: Awake/alert Behavior During Therapy: WFL for tasks assessed/performed Overall Cognitive Status: Within Functional Limits for tasks assessed                                 General Comments: pt cooperative and pleasant.  Pt did inspirometer - she was educated on its use.  she was not doing it  deeply after shoulder surgery but verbalizes now knowing how to do it.  Pt did cough up phelgm after inspirometer      General Comments      Exercises     Assessment/Plan    PT Assessment Patient needs continued PT services  PT Problem List Decreased mobility;Decreased activity tolerance;Cardiopulmonary status limiting activity;Pain       PT Treatment Interventions Therapeutic activities;Therapeutic exercise;Patient/family education;Functional mobility training    PT Goals (Current goals can be found in the Care Plan section)  Acute Rehab PT Goals Patient Stated Goal: to get back home and progress with my recovery PT Goal Formulation: With patient Time For Goal Achievement: 04/09/19 Potential to Achieve Goals: Good    Frequency Min 3X/week   Barriers to discharge        Co-evaluation       OT goals addressed during session: ADL's and self-care       AM-PAC PT "6 Clicks" Mobility  Outcome Measure Help needed turning from your back to your side while in a flat bed without using bedrails?: A Little Help needed moving from lying on your back to sitting on the side of a flat bed without using bedrails?: A Little Help needed moving to and from a bed to a chair (including a wheelchair)?: A Little Help needed standing up from a chair using your arms (e.g., wheelchair or bedside chair)?: A Little Help needed to walk in hospital room?: A Little Help needed climbing 3-5 steps with a railing? : A Little 6 Click Score: 18    End of Session Equipment Utilized During Treatment: (sling) Activity Tolerance: Patient tolerated treatment well Patient left: in chair;with call bell/phone within reach Nurse Communication: Mobility status PT Visit Diagnosis: Difficulty in walking, not elsewhere classified (R26.2)    Time: 1417-1440 PT Time Calculation (min) (ACUTE ONLY): 23 min   Charges:   PT Evaluation $PT Eval Low Complexity: 1 Low PT Treatments $Gait Training: 8-22 mins         04/02/2019   Rande Lawman, PT   Loyal Buba 04/02/2019, 2:56 PM

## 2019-04-02 NOTE — Evaluation (Signed)
Occupational Therapy Evaluation Patient Details Name: Sheryl [name] MRN: [id number] DOB: Mar 31, 1951 Today's Date: 04/02/2019    History of Present Illness 67 year old with past medical history significant for COPD, ADD, depression, chronic kidney disease, hyperlipidemia who presents to the ER with complaint of shortness of breath and cough that is started after she had surgery right shoulder rotator cuff tear arthropathy. Pt worked up for pneumonia   Clinical Impression   Pt is typically independent. She has had vertigo for several months, but reports no falls. Reviewed sling use, positioning R UE in bed and chair and performed AROM of R elbow to hand. Pt ambulated with min guard assist in halls with Sp02 of 94%. Will follow acutely.    Follow Up Recommendations  Follow surgeon's recommendation for DC plan and follow-up therapies;Supervision/Assistance - 24 hour    Equipment Recommendations  None recommended by OT    Recommendations for Other Services       Precautions / Restrictions Precautions Precautions: Fall Precaution Comments: pt has had vertigo since spring:  worse at sometimes but she reports it is always present.  pt denies any falls in last 3 months Required Braces or Orthoses: Sling Restrictions Weight Bearing Restrictions: Yes Other Position/Activity Restrictions: conservative shoulder protocol      Mobility Bed Mobility Overal bed mobility: Modified Independent             General bed mobility comments: up to L side of bed   Transfers Overall transfer level: Needs assistance Equipment used: None Transfers: Sit to/from Stand Sit to Stand: Supervision         General transfer comment: supervision for safety, pt with hx of vertigo    Balance Overall balance assessment: No apparent balance deficits (not formally assessed)                                         ADL either performed or assessed with clinical judgement   ADL  Overall ADL's : Needs assistance/impaired Eating/Feeding: Set up   Grooming: Set up;Sitting   Upper Body Bathing: Minimal assistance;Sitting   Lower Body Bathing: Sit to/from stand;Minimal assistance   Upper Body Dressing : Minimal assistance;Sitting   Lower Body Dressing: Minimal assistance;Sit to/from stand   Toilet Transfer: Min guard;Ambulation   Toileting- Clothing Manipulation and Hygiene: Minimal assistance;Sitting/lateral lean       Functional mobility during ADLs: Min guard General ADL Comments: educated in building activity tolerance and use of incentive spirometer     Vision Baseline Vision/History: Wears glasses Wears Glasses: At all times Patient Visual Report: No change from baseline       Perception     Praxis      Pertinent Vitals/Pain Pain Assessment: No/denies pain Faces Pain Scale: Hurts little more Pain Location: R shoulder Pain Descriptors / Indicators: Sore Pain Intervention(s): Monitored during session;Repositioned     Hand Dominance Left   Extremity/Trunk Assessment Upper Extremity Assessment Upper Extremity Assessment: Defer to OT evaluation RUE Deficits / Details: performed elbow to hand AROM x 10 RUE: Unable to fully assess due to immobilization RUE Coordination: decreased gross motor   Lower Extremity Assessment Lower Extremity Assessment: Defer to PT evaluation   Cervical / Trunk Assessment Cervical / Trunk Assessment: Normal   Communication Communication Communication: HOH(hearing aids at home)   Cognition Arousal/Alertness: Awake/alert Behavior During Therapy: WFL for tasks assessed/performed Overall Cognitive Status: Within Functional Limits for  tasks assessed                                 General Comments: pt cooperative and pleasant.  Pt did inspirometer - she was educated on its use.  she was not doing it deeply after shoulder surgery but verbalizes now knowing how to do it.  Pt did cough up phelgm  after inspirometer   General Comments       Exercises     Shoulder Instructions      Home Living Family/patient expects to be discharged to:: Private residence Living Arrangements: Children(daughter works from home) Available Help at Discharge: Family;Available 24 hours/day Type of Home: House Home Access: Stairs to enter [employer] of Steps: 3 Entrance Stairs-Rails: Right;Left Home Layout: Two level;Bed/bath upstairs;Full bath on main level Alternate Level Stairs-Number of Steps: flight   Bathroom Shower/Tub: Teacher, early years/pre: Standard     Home Equipment: None   Additional Comments: doesn't think she still has her shower seat.  She will sponge bathe      Prior Functioning/Environment Level of Independence: Independent                 OT Problem List: Decreased activity tolerance;Impaired balance (sitting and/or standing);Decreased knowledge of use of DME or AE;Decreased coordination;Pain;Impaired UE functional use      OT Treatment/Interventions:      OT Goals(Current goals can be found in the care plan section) Acute Rehab OT Goals Patient Stated Goal: to get back home and progress with my recovery OT Goal Formulation: With patient Time For Goal Achievement: 04/09/19 Potential to Achieve Goals: Good  OT Frequency:     Barriers to D/C:            Co-evaluation PT/OT/SLP Co-Evaluation/Treatment: Yes Reason for Co-Treatment: For patient/therapist safety   OT goals addressed during session: ADL's and self-care;Strengthening/ROM      AM-PAC OT "6 Clicks" Daily Activity     Outcome Measure Help from another person eating meals?: None Help from another person taking care of personal grooming?: A Little Help from another person toileting, which includes using toliet, bedpan, or urinal?: A Little Help from another person bathing (including washing, rinsing, drying)?: A Little Help from another person to put on and taking off  regular upper body clothing?: A Little Help from another person to put on and taking off regular lower body clothing?: A Little 6 Click Score: 19   End of Session Equipment Utilized During Treatment: Other (comment)(sling)  Activity Tolerance: Patient tolerated treatment well Patient left: in chair;with call bell/phone within reach  OT Visit Diagnosis: Pain Pain - Right/Left: Right Pain - part of body: Shoulder                Time: 1660-6301 OT Time Calculation (min): 33 min Charges:  OT General Charges $OT Visit: 1 Visit OT Evaluation $OT Eval Moderate Complexity: 1 Mod  Sheryl [name], OTR/L Acute Rehabilitation Services Pager: [phone] Office: [phone]  Sheryl [name] 04/02/2019, 3:39 PM

## 2019-04-02 NOTE — Progress Notes (Signed)
PROGRESS NOTE    Sheryl [name]  JSE:[id number] DOB: [birth year] DOA: 03/31/2019 PCP: Binnie Rail, MD   Brief Narrative: 68 year old with past medical history significant for COPD, ADD, depression, chronic kidney disease, hyperlipidemia who presents to the ER with complaint of shortness of breath and cough that is started after she had surgery right shoulder rotator cuff tear arthropathy.  In the ER patient was becoming hypoxic with minimal exertion.  CT angiogram showed fissure concerning for with pneumonia.  Was negative for PE.  COVID-19 test negative.  Stool for occult blood was negative.  Assessment & Plan:   Principal Problem:   Acute respiratory failure with hypoxia (HCC) Active Problems:   HTN (hypertension)   ADD (attention deficit disorder)   COPD (chronic obstructive pulmonary disease) (HCC)   CKD (chronic kidney disease)   Community acquired pneumonia   Acute blood loss anemia   1-Acute hypoxic respiratory failure likely secondary to pneumonia and acute blood loss anemia. Continue with supplemental oxygen. Treat for pneumonia. Still complaining of dyspnea, cough, continue with IV antibiotics.  Incentive spirometry.   2-Pneumonia: Continue with ceftriaxone and azithromycin. Legionella and strep antigen ordered. Sputum culture ordered. Pending results.   3-Acute blood loss anemia: iron deficiency anemia Transfuse if hemoglobin less than 7. Anemia panel consistent with iron deficiency.  Received IV iron.  B 12 injection times one. Start oral supplement.  Monitor hb.   4-Dizziness;  Check orthostatic.  Hold losartan.   COPD: Continue with inhalers.  Chronic kidney disease a stage III: Monitor cr  Hypertension: Continue with our hold hydrochlorothiazide.  Hyperlipidemia: Continue with the statins.  Rotator cuff sx;  Dr Veverly Fells informed of admission.        Estimated body mass index is 27.08 kg/m as calculated from the following:   Height as of  this encounter: 5\' 1"  (1.549 m).   Weight as of this encounter: 65 kg.   DVT prophylaxis: aspirin Code Status: full code Family Communication: care discussed with patient.  Disposition Plan: remain in the hospital for treatment of PNA Consultants:   ortho  Procedures:   none  Antimicrobials:  Ceftriaxone, azithro  Subjective: Still report dyspnea, on exertion.  lightheaded on exertion.   Objective: Vitals:   04/01/19 1036 04/01/19 1252 04/01/19 2113 04/02/19 0551  BP:  (!) 129/58 (!) 114/54 131/76  Pulse:  92 93 89  Resp:  17 16 18   Temp:  (!) 97.3 F (36.3 C) 97.9 F (36.6 C) 97.8 F (36.6 C)  TempSrc:  Oral Oral Oral  SpO2: 97% 100% 98% 97%  Weight:      Height:        Intake/Output Summary (Last 24 hours) at 04/02/2019 0951 Last data filed at 04/02/2019 0600 Gross per 24 hour  Intake 947 ml  Output -  Net 947 ml   Filed Weights   03/31/19 1820 03/31/19 1828 04/01/19 0644  Weight: 65 kg 62.1 kg 65 kg    Examination:  General exam: NAD Respiratory system: BL ronchus, normal respiratory effort Cardiovascular system: S 1, S 2 RRR Gastrointestinal system: bs present, soft, nt Central nervous system: non focal.  Extremities: symmetric power.  Skin: no rashes.    Data Reviewed: I have personally reviewed following labs and imaging studies  CBC: Recent Labs  Lab 03/26/19 1604 03/31/19 0222 03/31/19 2252 04/01/19 0731 04/02/19 0811  WBC 9.7  --  11.1* 9.5 7.7  NEUTROABS  --   --  7.3 7.3  --   HGB  12.0 9.4* 8.5* 8.1* 8.3*  HCT 38.3 30.2* 26.4* 25.0* 25.9*  MCV 94.8  --  94.3 93.3 94.2  PLT 383  --  289 253 923   Basic Metabolic Panel: Recent Labs  Lab 03/26/19 1604 03/31/19 0222 03/31/19 2252 04/01/19 0731 04/02/19 0811  NA 141 133* 134* 136 140  K 4.0 4.1 3.2* 3.3* 4.2  CL 107 103 103 106 107  CO2 25 22 22  21* 21*  GLUCOSE 99 118* 112* 124* 102*  BUN 21 26* 28* 20 14  CREATININE 1.18* 1.29* 1.43* 0.91 0.88  CALCIUM 9.4 8.0* 8.1*  7.6* 8.4*  MG  --   --   --  2.0  --    GFR: Estimated Creatinine Clearance: 52.8 mL/min (by C-G formula based on SCr of 0.88 mg/dL). Liver Function Tests: Recent Labs  Lab 04/01/19 0731  AST 45*  ALT 23  ALKPHOS 107  BILITOT 0.3  PROT 6.1*  ALBUMIN 2.7*   No results for input(s): LIPASE, AMYLASE in the last 168 hours. No results for input(s): AMMONIA in the last 168 hours. Coagulation Profile: No results for input(s): INR, PROTIME in the last 168 hours. Cardiac Enzymes: No results for input(s): CKTOTAL, CKMB, CKMBINDEX, TROPONINI in the last 168 hours. BNP (last 3 results) No results for input(s): PROBNP in the last 8760 hours. HbA1C: No results for input(s): HGBA1C in the last 72 hours. CBG: No results for input(s): GLUCAP in the last 168 hours. Lipid Profile: No results for input(s): CHOL, HDL, LDLCALC, TRIG, CHOLHDL, LDLDIRECT in the last 72 hours. Thyroid Function Tests: No results for input(s): TSH, T4TOTAL, FREET4, T3FREE, THYROIDAB in the last 72 hours. Anemia Panel: Recent Labs    04/01/19 0959  VITAMINB12 291  FOLATE 17.1  FERRITIN 39  TIBC 255  IRON 17*  RETICCTPCT 1.7   Sepsis Labs: No results for input(s): PROCALCITON, LATICACIDVEN in the last 168 hours.  Recent Results (from the past 240 hour(s))  SARS CORONAVIRUS 2 Nasal Swab Aptima Multi Swab     Status: None   Collection Time: 03/26/19  3:30 PM   Specimen: Aptima Multi Swab; Nasal Swab  Result Value Ref Range Status   SARS Coronavirus 2 NEGATIVE NEGATIVE Final    Comment: (NOTE) SARS-CoV-2 target nucleic acids are NOT DETECTED. The SARS-CoV-2 RNA is generally detectable in upper and lower respiratory specimens during the acute phase of infection. Negative results do not preclude SARS-CoV-2 infection, do not rule out co-infections with other pathogens, and should not be used as the sole basis for treatment or other patient management decisions. Negative results must be combined with clinical  observations, patient history, and epidemiological information. The expected result is Negative. Fact Sheet for Patients: SugarRoll.be Fact Sheet for Healthcare Providers: https://www.woods-mathews.com/ This test is not yet approved or cleared by the [place] FDA and  has been authorized for detection and/or diagnosis of SARS-CoV-2 by FDA under an Emergency Use Authorization (EUA). This EUA will remain  in effect (meaning this test can be used) for the duration of the COVID-19 declaration under Section 56 4(b)(1) of the Act, 21 U.S.C. section 360bbb-3(b)(1), unless the authorization is terminated or revoked sooner. Performed at [facility], [place] [address], [place], [place] [address]   Surgical pcr screen     Status: None   Collection Time: 03/26/19  3:36 PM   Specimen: Nasal Mucosa; Nasal Swab  Result Value Ref Range Status   MRSA, PCR NEGATIVE NEGATIVE Final   Staphylococcus aureus NEGATIVE  NEGATIVE Final    Comment: (NOTE) The Xpert SA Assay (FDA approved for NASAL specimens in patients 73 years of age and older), is one component of a comprehensive surveillance program. It is not intended to diagnose infection nor to guide or monitor treatment. Performed at [facility], [place] [address], [place], [place] [address]   SARS Coronavirus 2 [facility] order, Performed in [facility] hospital lab) Nasopharyngeal Nasopharyngeal Swab     Status: None   Collection Time: 04/01/19 12:53 AM   Specimen: Nasopharyngeal Swab  Result Value Ref Range Status   SARS Coronavirus 2 NEGATIVE NEGATIVE Final    Comment: (NOTE) If result is NEGATIVE SARS-CoV-2 target nucleic acids are NOT DETECTED. The SARS-CoV-2 RNA is generally detectable in upper and lower  respiratory specimens during the acute phase of infection. The lowest  concentration of SARS-CoV-2 viral copies this assay can detect is 250  copies / mL. A negative  result does not preclude SARS-CoV-2 infection  and should not be used as the sole basis for treatment or other  patient management decisions.  A negative result may occur with  improper specimen collection / handling, submission of specimen other  than nasopharyngeal swab, presence of viral mutation(s) within the  areas targeted by this assay, and inadequate number of viral copies  (<250 copies / mL). A negative result must be combined with clinical  observations, patient history, and epidemiological information. If result is POSITIVE SARS-CoV-2 target nucleic acids are DETECTED. The SARS-CoV-2 RNA is generally detectable in upper and lower  respiratory specimens dur ing the acute phase of infection.  Positive  results are indicative of active infection with SARS-CoV-2.  Clinical  correlation with patient history and other diagnostic information is  necessary to determine patient infection status.  Positive results do  not rule out bacterial infection or co-infection with other viruses. If result is PRESUMPTIVE POSTIVE SARS-CoV-2 nucleic acids MAY BE PRESENT.   A presumptive positive result was obtained on the submitted specimen  and confirmed on repeat testing.  While 2019 novel coronavirus  (SARS-CoV-2) nucleic acids may be present in the submitted sample  additional confirmatory testing may be necessary for epidemiological  and / or clinical management purposes  to differentiate between  SARS-CoV-2 and other Sarbecovirus currently known to infect humans.  If clinically indicated additional testing with an alternate test  methodology [phone]) is advised. The SARS-CoV-2 RNA is generally  detectable in upper and lower respiratory sp ecimens during the acute  phase of infection. The expected result is Negative. Fact Sheet for Patients:  StrictlyIdeas.no Fact Sheet for Healthcare Providers: BankingDealers.co.za This test is not yet  approved or cleared by the [place] FDA and has been authorized for detection and/or diagnosis of SARS-CoV-2 by FDA under an Emergency Use Authorization (EUA).  This EUA will remain in effect (meaning this test can be used) for the duration of the COVID-19 declaration under Section 564(b)(1) of the Act, 21 U.S.C. section 360bbb-3(b)(1), unless the authorization is terminated or revoked sooner. Performed at [facility], [place] [address] [place], [place] [address]          Radiology Studies: Dg Chest 2 View  Result Date: 03/31/2019 CLINICAL DATA:  Shortness of breath EXAM: CHEST - 2 VIEW COMPARISON:  October 31, 2017 FINDINGS: There is elevation of the right hemidiaphragm. Streaky opacity seen at the right lung base. The left lung is clear. The cardiomediastinal silhouette is unchanged. Patient is status post right total shoulder arthroplasty  with overlying subcutaneous emphysema. IMPRESSION: 1. Elevation of the right hemidiaphragm. 2. Streaky opacity at the right lung base which could be due to atelectasis and/or aspiration. Electronically Signed   By: Prudencio Pair M.D.   On: 03/31/2019 20:07   Ct Angio Chest Pe W/cm &/or Wo Cm  Result Date: 03/31/2019 CLINICAL DATA:  68 year old female with shortness of breath. Status post right shoulder surgery. EXAM: CT ANGIOGRAPHY CHEST WITH CONTRAST TECHNIQUE: Multidetector CT imaging of the chest was performed using the standard protocol during bolus administration of intravenous contrast. Multiplanar CT image reconstructions and MIPs were obtained to evaluate the vascular anatomy. CONTRAST:  19mL OMNIPAQUE IOHEXOL 350 MG/ML SOLN COMPARISON:  Chest radiograph dated 03/31/2019 FINDINGS: Evaluation of this exam is limited due to respiratory motion artifact. Cardiovascular: There is mild cardiomegaly. No pericardial effusion. Mild atherosclerotic calcification of the thoracic aorta. No aneurysmal dilatation or dissection. The visualized  origins of the great vessels of the aortic arch appear patent. Left carotid bulb calcified plaques noted. Evaluation of the pulmonary arteries is limited due to respiratory motion artifact and suboptimal visualization of the peripheral branches. No pulmonary artery embolus identified. Mediastinum/Nodes: There is no hilar or mediastinal adenopathy. The esophagus is grossly unremarkable. There is a 5 mm calcified nodule in the left lobe of the thyroid gland. No mediastinal fluid collection. Lungs/Pleura: There is eventration of the right hemidiaphragm. Patchy area of consolidative change at the right lung base may represent atelectasis. Pneumonia or aspiration is not excluded. Clinical correlation is recommended. There is a background of emphysema. There is no pleural effusion or pneumothorax. The central airways remain patent. Upper Abdomen: Apparent fatty infiltration of the liver. The visualized upper abdomen is otherwise unremarkable. Musculoskeletal: Postsurgical changes of right shoulder arthroplasty. There are pockets of air in the superficial soft tissues of the right axilla and right chest wall. No drainable fluid collection identified. No acute osseous pathology. Review of the MIP images confirms the above findings. IMPRESSION: 1. No CT evidence of central pulmonary artery embolus. 2. Right lung base atelectasis versus infiltrate. Clinical correlation is recommended. 3. Emphysema. 4. Postsurgical changes of right shoulder arthroplasty. 5. Aortic Atherosclerosis (ICD10-I70.0) and Emphysema (ICD10-J43.9). Electronically Signed   By: Anner Crete M.D.   On: 03/31/2019 23:48        Scheduled Meds: . aspirin  162 mg Oral Daily  . atomoxetine  100 mg Oral Daily  . FLUoxetine  60 mg Oral Daily  . losartan  100 mg Oral Daily  . polyethylene glycol  17 g Oral BID  . rosuvastatin  10 mg Oral Daily  . senna-docusate  1 tablet Oral BID  . traZODone  50 mg Oral QHS  . umeclidinium-vilanterol  1 puff  Inhalation Daily  . vitamin B-12  100 mcg Oral Daily   Continuous Infusions: . azithromycin 500 mg (04/02/19 0150)  . cefTRIAXone (ROCEPHIN)  IV 2 g (04/01/19 2228)     LOS: 1 day    Time spent: 35 minutes   Elmarie Shiley, MD [facility] Pager [phone]  If 7PM-7AM, please contact night-coverage [email or web address] Password [facility] 04/02/2019, 9:51 AM

## 2019-04-02 NOTE — Progress Notes (Signed)
Orthopedics Progress Note  Subjective: Feeling better today. She reports shortness of breath within 24 hours of being home despite using the incentive spirometer which she left at home.The shoulder hurts a little.   Objective:  Vitals:   04/01/19 2113 04/02/19 0551  BP: (!) 114/54 131/76  Pulse: 93 89  Resp: 16 18  Temp: 97.9 F (36.6 C) 97.8 F (36.6 C)  SpO2: 98% 97%    General: Awake and alert  Musculoskeletal: Shoulder dressing intact. Wiggles fingers well Neurovascularly intact  Lab Results  Component Value Date   WBC 9.5 04/01/2019   HGB 8.1 (L) 04/01/2019   HCT 25.0 (L) 04/01/2019   MCV 93.3 04/01/2019   PLT 253 04/01/2019       Component Value Date/Time   NA 136 04/01/2019 0731   NA 138 02/18/2017 1157   K 3.3 (L) 04/01/2019 0731   CL 106 04/01/2019 0731   CO2 21 (L) 04/01/2019 0731   GLUCOSE 124 (H) 04/01/2019 0731   BUN 20 04/01/2019 0731   BUN 14 02/18/2017 1157   CREATININE 0.91 04/01/2019 0731   CREATININE 1.16 (H) 03/22/2016 1610   CALCIUM 7.6 (L) 04/01/2019 0731   GFRNONAA >60 04/01/2019 0731   GFRAA >60 04/01/2019 0731    Lab Results  Component Value Date   INR 2.03 (H) 02/27/2011   INR 2.70 (H) 02/26/2011   INR 2.02 (H) 02/25/2011    Assessment/Plan: POD #5 s/p  Occupational therapy for the shoulder while here.  Appreciate excellent medical team care. Incentive spirometer ordered.  Ice to the shoulder  Doran Heater. Veverly Fells, MD 04/02/2019 7:44 AM

## 2019-04-02 NOTE — Telephone Encounter (Signed)
Called patient and LVM that Dr. Quay Burow' office was following up after her D/C from the hospital. Nurse explained that her discharge instructions stated for her to make a follow-up appointment with PCP. Nurse requested that the patient call her back to make the appointment and to discuss any discharge questions or needs.

## 2019-04-03 LAB — CBC
HCT: 24.6 % — ABNORMAL LOW (ref 36.0–46.0)
Hemoglobin: 7.7 g/dL — ABNORMAL LOW (ref 12.0–15.0)
MCH: 29.3 pg (ref 26.0–34.0)
MCHC: 31.3 g/dL (ref 30.0–36.0)
MCV: 93.5 fL (ref 80.0–100.0)
Platelets: 313 10*3/uL (ref 150–400)
RBC: 2.63 MIL/uL — ABNORMAL LOW (ref 3.87–5.11)
RDW: 14 % (ref 11.5–15.5)
WBC: 9.1 10*3/uL (ref 4.0–10.5)
nRBC: 0 % (ref 0.0–0.2)

## 2019-04-03 LAB — HEPATIC FUNCTION PANEL
ALT: 39 U/L (ref 0–44)
AST: 45 U/L — ABNORMAL HIGH (ref 15–41)
Albumin: 2.9 g/dL — ABNORMAL LOW (ref 3.5–5.0)
Alkaline Phosphatase: 162 U/L — ABNORMAL HIGH (ref 38–126)
Bilirubin, Direct: 0.2 mg/dL (ref 0.0–0.2)
Indirect Bilirubin: 0.5 mg/dL (ref 0.3–0.9)
Total Bilirubin: 0.7 mg/dL (ref 0.3–1.2)
Total Protein: 6.9 g/dL (ref 6.5–8.1)

## 2019-04-03 LAB — STREP PNEUMONIAE URINARY ANTIGEN: Strep Pneumo Urinary Antigen: NEGATIVE

## 2019-04-03 LAB — PREPARE RBC (CROSSMATCH)

## 2019-04-03 LAB — ABO/RH: ABO/RH(D): O POS

## 2019-04-03 MED ORDER — FUROSEMIDE 10 MG/ML IJ SOLN
20.0000 mg | Freq: Once | INTRAMUSCULAR | Status: AC
  Administered 2019-04-03: 20 mg via INTRAVENOUS
  Filled 2019-04-03: qty 2

## 2019-04-03 MED ORDER — SODIUM CHLORIDE 0.9% IV SOLUTION
Freq: Once | INTRAVENOUS | Status: AC
  Administered 2019-04-03: 12:00:00 via INTRAVENOUS

## 2019-04-03 NOTE — Telephone Encounter (Signed)
Reach out to patient and LVM concerning D/C from hospital. Nurse stated for patient to make an appointment to f/u with PCP and requested that she callback if she had any further question or concerns.

## 2019-04-03 NOTE — Progress Notes (Signed)
PT Cancellation Note  Patient Details Name: Sheryl [name] MRN: [id number] DOB: 1951-05-10   Cancelled Treatment:     Pt is currently receiving blood. Pt reports she has been feeling dizzy therefore I will hold therapy today. Pt reports she has a follow-up orthopedic appointment in 2 weeks for her recent shoulder surgery. Pt will eventually need outpatient PT.   [name] 04/03/2019, 2:59 PM

## 2019-04-03 NOTE — Progress Notes (Signed)
Occupational Therapy Treatment Patient Details Name: Sheryl [name] MRN: [id number] DOB: 06-14-1951 Today's Date: 04/03/2019    History of present illness 68 year old with past medical history significant for COPD, ADD, depression, chronic kidney disease, hyperlipidemia who presents to the ER with complaint of shortness of breath and cough that is started after she had surgery right shoulder rotator cuff tear arthropathy. Pt worked up for pneumonia   OT comments  Pt does not initiate her exercise program unless prompted by therapy. Pt currently receiving blood. Performed R elbow to hand AROM x 10 each and instructed and positioned pt in sling. Instructed pt to remove sling x 3 per day and perform exercises. Pt verbalized understanding.  Follow Up Recommendations  Follow surgeon's recommendation for DC plan and follow-up therapies;Supervision/Assistance - 24 hour    Equipment Recommendations  None recommended by OT    Recommendations for Other Services      Precautions / Restrictions Precautions Precautions: Shoulder Type of Shoulder Precautions: conservative protocol Shoulder Interventions: Shoulder sling/immobilizer;Off for dressing/bathing/exercises Required Braces or Orthoses: Sling Restrictions Weight Bearing Restrictions: Yes RUE Weight Bearing: Non weight bearing       Mobility Bed Mobility                  Transfers                      Balance                                           ADL either performed or assessed with clinical judgement   ADL                                         General ADL Comments: educated in donning and doffing sling, positioning in bed     Vision       Perception     Praxis      Cognition Arousal/Alertness: Awake/alert Behavior During Therapy: WFL for tasks assessed/performed Overall Cognitive Status: Within Functional Limits for tasks assessed                                          Exercises     Shoulder Instructions       General Comments      Pertinent Vitals/ Pain       Pain Assessment: Faces Faces Pain Scale: Hurts little more Pain Location: R shoulder Pain Descriptors / Indicators: Sore Pain Intervention(s): Monitored during session;Repositioned;Ice applied  Home Living                                          Prior Functioning/Environment              Frequency  Min 2X/week        Progress Toward Goals  OT Goals(current goals can now be found in the care plan section)  Progress towards OT goals: Progressing toward goals  Acute Rehab OT Goals Patient Stated Goal: to get back home and progress with my recovery OT Goal Formulation: With patient Time For Goal  Achievement: 04/09/19 Potential to Achieve Goals: Good  Plan Discharge plan remains appropriate    Co-evaluation                 AM-PAC OT "6 Clicks" Daily Activity     Outcome Measure   Help from another person eating meals?: None Help from another person taking care of personal grooming?: A Little Help from another person toileting, which includes using toliet, bedpan, or urinal?: A Little Help from another person bathing (including washing, rinsing, drying)?: A Little Help from another person to put on and taking off regular upper body clothing?: A Little Help from another person to put on and taking off regular lower body clothing?: A Little 6 Click Score: 19    End of Session    OT Visit Diagnosis: Pain Pain - Right/Left: Right Pain - part of body: Shoulder   Activity Tolerance Treatment limited secondary to medical complications (Comment)(hgb of 7.7 )   Patient Left in bed;with call bell/phone within reach   Nurse Communication          Time: 9983-3825 OT Time Calculation (min): 22 min  Charges: OT General Charges $OT Visit: 1 Visit OT Treatments $Therapeutic Exercise: 8-22 mins  Nestor Lewandowsky, OTR/L Acute Rehabilitation Services Pager: [phone] Office: [phone]   Sheryl [name] 04/03/2019, 2:55 PM

## 2019-04-03 NOTE — Care Management Important Message (Signed)
Important Message  Patient Details IM Letter given to Rhea Pink SW to present to the Patient Name: Sheryl [name] MRN: [id number] Date of Birth: [birth year]   Medicare Important Message Given:  Yes     Kerin Salen 04/03/2019, 10:25 AM

## 2019-04-03 NOTE — Progress Notes (Signed)
PROGRESS NOTE    Sheryl [name]  IBB:[id number] DOB: 01/15/1951 DOA: 03/31/2019 PCP: Binnie Rail, MD   Brief Narrative: 68 year old with past medical history significant for COPD, ADD, depression, chronic kidney disease, hyperlipidemia who presents to the ER with complaint of shortness of breath and cough that is started after she had surgery right shoulder rotator cuff tear arthropathy.  In the ER patient was becoming hypoxic with minimal exertion.  CT angiogram showed fissure concerning for with pneumonia.  Was negative for PE.  COVID-19 test negative.  Stool for occult blood was negative.  Assessment & Plan:   Principal Problem:   Acute respiratory failure with hypoxia (HCC) Active Problems:   HTN (hypertension)   ADD (attention deficit disorder)   COPD (chronic obstructive pulmonary disease) (HCC)   CKD (chronic kidney disease)   Community acquired pneumonia   Acute blood loss anemia   1-Acute hypoxic respiratory failure likely secondary to pneumonia and acute blood loss anemia. Continue with supplemental oxygen. Treat for pneumonia. Continue with IV antibiotic.s  Incentive spirometry.   2-Pneumonia: Continue with ceftriaxone and azithromycin. Legionella and strep antigen ordered. Sputum culture ordered. Pending results.   3-Acute blood loss anemia: iron deficiency anemia Anemia panel consistent with iron deficiency.  Received IV iron.  B 12 injection times one. Start oral supplement.  Hb decreased to 7.7, she is symptomatic. Will transfuse 2 units.  No evidence of hemolysis or active bleeding.   4-Dizziness;  Orthostatic negative Hold losartan.   COPD: Continue with inhalers.  Chronic kidney disease a stage III: Monitor cr  Hypertension: Continue with our hold hydrochlorothiazide.  Hyperlipidemia: Continue with the statins.  Rotator cuff sx;  Dr Veverly Fells informed of admission.        Estimated body mass index is 27.08 kg/m as calculated from the  following:   Height as of this encounter: 5\' 1"  (1.549 m).   Weight as of this encounter: 65 kg.   DVT prophylaxis: aspirin Code Status: full code Family Communication: care discussed with patient.  Disposition Plan: remain in the hospital for treatment of PNA Consultants:   ortho  Procedures:   none  Antimicrobials:  Ceftriaxone, azithro  Subjective: She is still SOB on exertion. Denies abdominal pain, no black stool, no bright red per rectum   Objective: Vitals:   04/02/19 0551 04/02/19 1257 04/02/19 2129 04/03/19 0612  BP: 131/76 (!) 115/47 (!) 130/55 92/77  Pulse: 89 91 90 87  Resp: 18 16 18 20   Temp: 97.8 F (36.6 C) 97.9 F (36.6 C) 98.3 F (36.8 C) 97.8 F (36.6 C)  TempSrc: Oral Oral Oral Oral  SpO2: 97% 100% 100% 97%  Weight:      Height:        Intake/Output Summary (Last 24 hours) at 04/03/2019 0843 Last data filed at 04/02/2019 1700 Gross per 24 hour  Intake 480 ml  Output -  Net 480 ml   Filed Weights   03/31/19 1820 03/31/19 1828 04/01/19 0644  Weight: 65 kg 62.1 kg 65 kg    Examination:  General exam: NAD Respiratory system: No wheezing Cardiovascular system: S 1, S 2 RRR Gastrointestinal system: BS present, soft, nt Central nervous system: Non focal.  Extremities: Symmetric power.  Skin: No rashes   Data Reviewed: I have personally reviewed following labs and imaging studies  CBC: Recent Labs  Lab 03/31/19 0222 03/31/19 2252 04/01/19 0731 04/02/19 0811 04/03/19 0501  WBC  --  11.1* 9.5 7.7 9.1  NEUTROABS  --  7.3 7.3  --   --   HGB 9.4* 8.5* 8.1* 8.3* 7.7*  HCT 30.2* 26.4* 25.0* 25.9* 24.6*  MCV  --  94.3 93.3 94.2 93.5  PLT  --  289 253 264 553   Basic Metabolic Panel: Recent Labs  Lab 03/31/19 0222 03/31/19 2252 04/01/19 0731 04/02/19 0811  NA 133* 134* 136 140  K 4.1 3.2* 3.3* 4.2  CL 103 103 106 107  CO2 22 22 21* 21*  GLUCOSE 118* 112* 124* 102*  BUN 26* 28* 20 14  CREATININE 1.29* 1.43* 0.91 0.88   CALCIUM 8.0* 8.1* 7.6* 8.4*  MG  --   --  2.0  --    GFR: Estimated Creatinine Clearance: 52.8 mL/min (by C-G formula based on SCr of 0.88 mg/dL). Liver Function Tests: Recent Labs  Lab 04/01/19 0731  AST 45*  ALT 23  ALKPHOS 107  BILITOT 0.3  PROT 6.1*  ALBUMIN 2.7*   No results for input(s): LIPASE, AMYLASE in the last 168 hours. No results for input(s): AMMONIA in the last 168 hours. Coagulation Profile: No results for input(s): INR, PROTIME in the last 168 hours. Cardiac Enzymes: No results for input(s): CKTOTAL, CKMB, CKMBINDEX, TROPONINI in the last 168 hours. BNP (last 3 results) No results for input(s): PROBNP in the last 8760 hours. HbA1C: No results for input(s): HGBA1C in the last 72 hours. CBG: No results for input(s): GLUCAP in the last 168 hours. Lipid Profile: No results for input(s): CHOL, HDL, LDLCALC, TRIG, CHOLHDL, LDLDIRECT in the last 72 hours. Thyroid Function Tests: No results for input(s): TSH, T4TOTAL, FREET4, T3FREE, THYROIDAB in the last 72 hours. Anemia Panel: Recent Labs    04/01/19 0959  VITAMINB12 291  FOLATE 17.1  FERRITIN 39  TIBC 255  IRON 17*  RETICCTPCT 1.7   Sepsis Labs: No results for input(s): PROCALCITON, LATICACIDVEN in the last 168 hours.  Recent Results (from the past 240 hour(s))  SARS CORONAVIRUS 2 Nasal Swab Aptima Multi Swab     Status: None   Collection Time: 03/26/19  3:30 PM   Specimen: Aptima Multi Swab; Nasal Swab  Result Value Ref Range Status   SARS Coronavirus 2 NEGATIVE NEGATIVE Final    Comment: (NOTE) SARS-CoV-2 target nucleic acids are NOT DETECTED. The SARS-CoV-2 RNA is generally detectable in upper and lower respiratory specimens during the acute phase of infection. Negative results do not preclude SARS-CoV-2 infection, do not rule out co-infections with other pathogens, and should not be used as the sole basis for treatment or other patient management decisions. Negative results must be combined  with clinical observations, patient history, and epidemiological information. The expected result is Negative. Fact Sheet for Patients: SugarRoll.be Fact Sheet for Healthcare Providers: https://www.woods-mathews.com/ This test is not yet approved or cleared by the [place] FDA and  has been authorized for detection and/or diagnosis of SARS-CoV-2 by FDA under an Emergency Use Authorization (EUA). This EUA will remain  in effect (meaning this test can be used) for the duration of the COVID-19 declaration under Section 56 4(b)(1) of the Act, 21 U.S.C. section 360bbb-3(b)(1), unless the authorization is terminated or revoked sooner. Performed at [facility], [place] [address] [place], [place] [address]   Surgical pcr screen     Status: None   Collection Time: 03/26/19  3:36 PM   Specimen: Nasal Mucosa; Nasal Swab  Result Value Ref Range Status   MRSA, PCR NEGATIVE NEGATIVE Final   Staphylococcus aureus NEGATIVE NEGATIVE Final  Comment: (NOTE) The Xpert SA Assay (FDA approved for NASAL specimens in patients 26 years of age and older), is one component of a comprehensive surveillance program. It is not intended to diagnose infection nor to guide or monitor treatment. Performed at [facility], [place] [address] [place], [place] [address]   SARS Coronavirus 2 [facility] order, Performed in [facility] hospital lab) Nasopharyngeal Nasopharyngeal Swab     Status: None   Collection Time: 04/01/19 12:53 AM   Specimen: Nasopharyngeal Swab  Result Value Ref Range Status   SARS Coronavirus 2 NEGATIVE NEGATIVE Final    Comment: (NOTE) If result is NEGATIVE SARS-CoV-2 target nucleic acids are NOT DETECTED. The SARS-CoV-2 RNA is generally detectable in upper and lower  respiratory specimens during the acute phase of infection. The lowest  concentration of SARS-CoV-2 viral copies this assay can detect is 250  copies / mL. A  negative result does not preclude SARS-CoV-2 infection  and should not be used as the sole basis for treatment or other  patient management decisions.  A negative result may occur with  improper specimen collection / handling, submission of specimen other  than nasopharyngeal swab, presence of viral mutation(s) within the  areas targeted by this assay, and inadequate number of viral copies  (<250 copies / mL). A negative result must be combined with clinical  observations, patient history, and epidemiological information. If result is POSITIVE SARS-CoV-2 target nucleic acids are DETECTED. The SARS-CoV-2 RNA is generally detectable in upper and lower  respiratory specimens dur ing the acute phase of infection.  Positive  results are indicative of active infection with SARS-CoV-2.  Clinical  correlation with patient history and other diagnostic information is  necessary to determine patient infection status.  Positive results do  not rule out bacterial infection or co-infection with other viruses. If result is PRESUMPTIVE POSTIVE SARS-CoV-2 nucleic acids MAY BE PRESENT.   A presumptive positive result was obtained on the submitted specimen  and confirmed on repeat testing.  While 2019 novel coronavirus  (SARS-CoV-2) nucleic acids may be present in the submitted sample  additional confirmatory testing may be necessary for epidemiological  and / or clinical management purposes  to differentiate between  SARS-CoV-2 and other Sarbecovirus currently known to infect humans.  If clinically indicated additional testing with an alternate test  methodology [phone]) is advised. The SARS-CoV-2 RNA is generally  detectable in upper and lower respiratory sp ecimens during the acute  phase of infection. The expected result is Negative. Fact Sheet for Patients:  StrictlyIdeas.no Fact Sheet for Healthcare Providers: BankingDealers.co.za This test is not  yet approved or cleared by the [place] FDA and has been authorized for detection and/or diagnosis of SARS-CoV-2 by FDA under an Emergency Use Authorization (EUA).  This EUA will remain in effect (meaning this test can be used) for the duration of the COVID-19 declaration under Section 564(b)(1) of the Act, 21 U.S.C. section 360bbb-3(b)(1), unless the authorization is terminated or revoked sooner. Performed at [facility], [place] [address] [place], [place] [address]          Radiology Studies: No results found.      Scheduled Meds: . sodium chloride   Intravenous Once  . aspirin  162 mg Oral Daily  . atomoxetine  100 mg Oral Daily  . FLUoxetine  60 mg Oral Daily  . polyethylene glycol  17 g Oral BID  . rosuvastatin  10 mg Oral Daily  . senna-docusate  1  tablet Oral BID  . traZODone  50 mg Oral QHS  . umeclidinium-vilanterol  1 puff Inhalation Daily  . vitamin B-12  100 mcg Oral Daily   Continuous Infusions: . azithromycin 500 mg (04/03/19 0127)  . cefTRIAXone (ROCEPHIN)  IV 2 g (04/02/19 2228)     LOS: 2 days    Time spent: 35 minutes   Elmarie Shiley, MD [facility] Pager [phone]  If 7PM-7AM, please contact night-coverage [email or web address] Password [facility] 04/03/2019, 8:43 AM

## 2019-04-04 LAB — BPAM RBC
Blood Product Expiration Date: 202009092359
Blood Product Expiration Date: 202009092359
ISSUE DATE / TIME: 202008141153
ISSUE DATE / TIME: 202008141556
Unit Type and Rh: 5100
Unit Type and Rh: 5100

## 2019-04-04 LAB — TYPE AND SCREEN
ABO/RH(D): O POS
Antibody Screen: NEGATIVE
Unit division: 0
Unit division: 0

## 2019-04-04 LAB — CBC
HCT: 36.1 % (ref 36.0–46.0)
Hemoglobin: 11.6 g/dL — ABNORMAL LOW (ref 12.0–15.0)
MCH: 30 pg (ref 26.0–34.0)
MCHC: 32.1 g/dL (ref 30.0–36.0)
MCV: 93.3 fL (ref 80.0–100.0)
Platelets: 322 10*3/uL (ref 150–400)
RBC: 3.87 MIL/uL (ref 3.87–5.11)
RDW: 14.4 % (ref 11.5–15.5)
WBC: 8.8 10*3/uL (ref 4.0–10.5)
nRBC: 0 % (ref 0.0–0.2)

## 2019-04-04 LAB — LEGIONELLA PNEUMOPHILA SEROGP 1 UR AG: L. pneumophila Serogp 1 Ur Ag: NEGATIVE

## 2019-04-04 MED ORDER — AMOXICILLIN-POT CLAVULANATE 875-125 MG PO TABS: 1.0000 | ORAL_TABLET | Freq: Two times a day (BID) | ORAL | 0 refills | Status: DC

## 2019-04-04 MED ORDER — POLYETHYLENE GLYCOL 3350 17 G PO PACK: 17.0000 g | PACK | Freq: Two times a day (BID) | ORAL | 0 refills | Status: DC

## 2019-04-04 MED ORDER — CYANOCOBALAMIN 100 MCG PO TABS: 100.0000 ug | ORAL_TABLET | Freq: Every day | ORAL | 0 refills | Status: DC

## 2019-04-04 MED ORDER — FERROUS SULFATE 325 (65 FE) MG PO TABS: 325.0000 mg | ORAL_TABLET | Freq: Two times a day (BID) | ORAL | 3 refills | Status: DC

## 2019-04-04 MED ORDER — AZITHROMYCIN 500 MG PO TABS: 500.0000 mg | ORAL_TABLET | Freq: Every day | ORAL | 0 refills | Status: AC

## 2019-04-04 MED ORDER — AMOXICILLIN-POT CLAVULANATE 875-125 MG PO TABS: 1.0000 | ORAL_TABLET | Freq: Two times a day (BID) | ORAL | 0 refills | Status: AC

## 2019-04-04 NOTE — Progress Notes (Signed)
Occupational Therapy Treatment Patient Details Name: Sheryl [name] MRN: [id number] DOB: 1951-03-26 Today's Date: 04/04/2019    History of present illness 68 year old with past medical history significant for COPD, ADD, depression, chronic kidney disease, hyperlipidemia who presents to the ER with complaint of shortness of breath and cough that is started after she had surgery right shoulder rotator cuff tear arthropathy. Pt worked up for pneumonia   OT comments  Pt seen for AROM elbow, wrist, and hand as well as reinforcement of precautions and sling wear - requires mod A.  She reports daughter will assist her at home, and it appears, via notes, daughter went through instruction after TSA.   Follow Up Recommendations  Follow surgeon's recommendation for DC plan and follow-up therapies;Supervision/Assistance - 24 hour    Equipment Recommendations  None recommended by OT    Recommendations for Other Services      Precautions / Restrictions Precautions Precautions: Shoulder Type of Shoulder Precautions: conservative protocol Shoulder Interventions: Shoulder sling/immobilizer;Off for dressing/bathing/exercises Required Braces or Orthoses: Sling Restrictions Weight Bearing Restrictions: Yes RUE Weight Bearing: Non weight bearing Other Position/Activity Restrictions: conservative shoulder protocol       Mobility Bed Mobility Overal bed mobility: Modified Independent                Transfers Overall transfer level: Needs assistance Equipment used: None Transfers: Sit to/from Stand Sit to Stand: Supervision              Balance Overall balance assessment: No apparent balance deficits (not formally assessed)                                         ADL either performed or assessed with clinical judgement   ADL                                         General ADL Comments: educated in donning and doffing sling     Vision        Perception     Praxis      Cognition Arousal/Alertness: Awake/alert Behavior During Therapy: WFL for tasks assessed/performed Overall Cognitive Status: No family/caregiver present to determine baseline cognitive functioning                                 General Comments: Pt unable to recall precautions or elbow, wrist, and hand exercises         Exercises Exercises: Shoulder Shoulder Exercises Elbow Flexion: AROM;Right;10 reps;Standing Elbow Extension: AROM;Right;10 reps;Standing Wrist Flexion: AROM;Right;10 reps;Standing Wrist Extension: AROM;Right;10 reps;Standing Digit Composite Flexion: AROM;Right;10 reps;Standing Composite Extension: AROM;Right;Standing;10 reps   Shoulder Instructions Shoulder Instructions Correct positioning of sling/immobilizer: Moderate assistance ROM for elbow, wrist and digits of operated UE: Minimal assistance     General Comments Pt with no recollection of exercises or precautions.  Instructed pt in NO shoulder ROM, and to wear sling at all times.  Instructed her in ROM elbow, wrist, and hand while keeping Rt elbow tucked into her side to prevent shoulder movement - she says daughter will be helping her with that at home    Pertinent Vitals/ Pain       Pain Assessment: Faces Faces Pain Scale: Hurts little more Pain Location: R shoulder  Pain Descriptors / Indicators: Sore Pain Intervention(s): Monitored during session  Home Living                                          Prior Functioning/Environment              Frequency  Min 2X/week        Progress Toward Goals  OT Goals(current goals can now be found in the care plan section)  Progress towards OT goals: Progressing toward goals     Plan Discharge plan remains appropriate    Co-evaluation                 AM-PAC OT "6 Clicks" Daily Activity     Outcome Measure   Help from another person eating meals?: None Help from another  person taking care of personal grooming?: A Little Help from another person toileting, which includes using toliet, bedpan, or urinal?: A Little Help from another person bathing (including washing, rinsing, drying)?: A Little Help from another person to put on and taking off regular upper body clothing?: A Little Help from another person to put on and taking off regular lower body clothing?: A Little 6 Click Score: 19    End of Session    OT Visit Diagnosis: Pain Pain - Right/Left: Right Pain - part of body: Shoulder   Activity Tolerance Treatment limited secondary to medical complications (Comment)(hgb of 7.7 )   Patient Left in bed;with call bell/phone within reach   Nurse Communication          Time: 1007-1020 OT Time Calculation (min): 13 min  Charges: OT General Charges $OT Visit: 1 Visit  Lucille Passy, OTR/L [place] Pager [phone] Office [phone]    Lucille Passy M 04/04/2019, 11:16 AM

## 2019-04-04 NOTE — Progress Notes (Signed)
Patient discharging home.  IV removed - WNL.  Reviewed AVS and medications. Patient asked about showering, previous AVS pulled up and notes from surgeon copied over as reminder.  Patient verbalizes understanding.  No questions at this time.  Patient in NAD, waiting for neighbor to arrive to transport home

## 2019-04-04 NOTE — Progress Notes (Signed)
Patients O2 checked with ambulation to the BR - sats maintained 91-95% on RA.  Checked again after patient settled and back in bed.  reamains at 95% on RA

## 2019-04-04 NOTE — Progress Notes (Signed)
Patient had large smooth formed BM this AM.  Patient had to get up urgently and no hat in commode.  Unable to collect hemoccult.  No blood obvious from appearance of stool though - light brown in color

## 2019-04-04 NOTE — Discharge Summary (Signed)
Physician Discharge Summary  Sheryl [name] JOA:[id number] DOB: 04/04/1951 DOA: 03/31/2019  PCP: Binnie Rail, MD  Admit date: 03/31/2019 Discharge date: 04/04/2019  Admitted From: Home  Disposition: Home   Recommendations for Outpatient Follow-up:  1. Follow up with PCP in 1-2 weeks 2. Please obtain BMP/CBC in one week 3. Needs repeat Hb level. Further evaluation of iron deficiency anemia./  4. Follow up on resolution of PNA.  5. Follow up with ortho post op shoulder sx.  6. Consider resuming cozaar if BP increases.   Home Health: none  Discharge Condition: stable.  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  In the ER patient was becoming hypoxic with minimal exertion.  CT angiogram showed fissure concerning for with pneumonia.  Was negative for PE.  COVID-19 test negative.  Stool for occult blood was negative.  1-Acute hypoxic respiratory failure likely secondary to pneumonia and acute blood loss anemia. Continue with supplemental oxygen. Treat for pneumonia. Continue with IV antibiotic.s  Incentive spirometry.   2-Pneumonia: Treated  with ceftriaxone (4 days)  and azithromycin (3 days), will discharge on 3 days of Augmentin and azithromycin. Legionella and strep antigen ordered. Sputum culture ordered. Pending results.   3-Acute blood loss anemia: iron deficiency anemia Anemia panel consistent with iron deficiency.  Received IV iron.  B 12 injection times one. Start oral supplement.  Received 2 units PRBC 8/14. Hb increase appropriately to 11.  No evidence of hemolysis or active bleeding.  She has hematoma flank area, same site of her shoulder surgery. This  might have explain some blood loss. Advised to apply ice. Monitor closely at home. Discussed with ortho on call ok to continue with aspirin.  Hb today stable.   4-Dizziness;  Orthostatic negative Hold losartan.  improved after Blood transfusion.   COPD: Continue with inhalers.  Chronic kidney disease a stage III: Monitor cr  Hypertension: Continue with our hold cozaar, resume hydrochlorothiazide to avoid fluid retention. .  Hyperlipidemia: Continue with the statins.  Rotator cuff sx;  Dr Veverly Fells informed of admission.  plan to follow up out patient.      Discharge Diagnoses:  Principal Problem:   Acute respiratory failure with hypoxia (HCC) Active Problems:   HTN (hypertension)   ADD (attention deficit disorder)   COPD (chronic obstructive pulmonary disease) (HCC)   CKD (chronic kidney disease)   Community acquired pneumonia   Acute blood loss anemia    Discharge Instructions  Discharge Instructions    Diet - low sodium heart healthy   Complete by: As directed    Increase activity slowly   Complete by: As directed      Allergies as of 04/04/2019      Reactions   Macrobid [nitrofurantoin Monohyd Macro] Nausea And Vomiting   GI upset      Medication List    STOP taking these medications   losartan 100 MG tablet Commonly known as: COZAAR     TAKE these medications   albuterol 108 (90 Base) MCG/ACT inhaler Commonly known as: VENTOLIN HFA Inhale 1-2 puffs into the lungs every 6 (six) hours as needed for wheezing or shortness of breath.   amoxicillin-clavulanate 875-125 MG tablet Commonly known as: Augmentin Take 1 tablet by mouth 2 (  two) times daily for 3 days.   Anoro Ellipta 62.5-25 MCG/INH Aepb Generic drug: umeclidinium-vilanterol TAKE 1 PUFF BY MOUTH EVERY DAY What changed: See the new instructions.   aspirin 81 MG chewable tablet Chew 162 mg by mouth daily.   atomoxetine 100 MG capsule Commonly known as: STRATTERA TAKE 1 CAPSULE BY  MOUTH EVERY DAY What changed: how much to take   azithromycin 500 MG tablet Commonly known as: Zithromax Take 1 tablet (500 mg total) by mouth daily for 3 days. Take 1 tablet daily for 3 days.   cyanocobalamin 100 MCG tablet Take 1 tablet (100 mcg total) by mouth daily. Start taking on: April 05, 2019   ferrous sulfate 325 (65 FE) MG tablet Take 1 tablet (325 mg total) by mouth 2 (two) times daily with a meal.   FLUoxetine 40 MG capsule Commonly known as: PROZAC Take 1 capsule (40 mg total) by mouth daily. In addition to 20 mg for total of 60 mg daily What changed: Another medication with the same name was removed. Continue taking this medication, and follow the directions you see here.   hydrochlorothiazide 12.5 MG tablet Commonly known as: HYDRODIURIL Take 1 tablet (12.5 mg total) by mouth daily.   HYDROcodone-acetaminophen 5-325 MG tablet Commonly known as: Norco Take 1 tablet by mouth every 4 (four) hours as needed for moderate pain or severe pain.   meclizine 25 MG tablet Commonly known as: ANTIVERT Take 1 tablet (25 mg total) by mouth 3 (three) times daily as needed for dizziness.   polyethylene glycol 17 g packet Commonly known as: MIRALAX / GLYCOLAX Take 17 g by mouth 2 (two) times daily.   rosuvastatin 10 MG tablet Commonly known as: CRESTOR Take 1 tablet (10 mg total) by mouth daily.   traMADol 50 MG tablet Commonly known as: ULTRAM Take 1 tablet (50 mg total) by mouth every 6 (six) hours as needed. What changed: reasons to take this   traZODone 50 MG tablet Commonly known as: DESYREL TAKE 1 TABLET BY MOUTH EVERYDAY AT BEDTIME What changed:   how much to take  how to take this  when to take this  additional instructions   TURMERIC PO Take 1,000 mg by mouth daily.   ZINC PICOLINATE PO Take 50 mg by mouth daily.       Allergies  Allergen Reactions  . Macrobid [Nitrofurantoin Monohyd Macro] Nausea And Vomiting    GI upset     Consultations:  Dr Veverly Fells   Procedures/Studies: Dg Chest 2 View  Result Date: 03/31/2019 CLINICAL DATA:  Shortness of breath EXAM: CHEST - 2 VIEW COMPARISON:  October 31, 2017 FINDINGS: There is elevation of the right hemidiaphragm. Streaky opacity seen at the right lung base. The left lung is clear. The cardiomediastinal silhouette is unchanged. Patient is status post right total shoulder arthroplasty with overlying subcutaneous emphysema. IMPRESSION: 1. Elevation of the right hemidiaphragm. 2. Streaky opacity at the right lung base which could be due to atelectasis and/or aspiration. Electronically Signed   By: Prudencio Pair M.D.   On: 03/31/2019 20:07   Ct Angio Chest Pe W/cm &/or Wo Cm  Result Date: 03/31/2019 CLINICAL DATA:  68 year old female with shortness of breath. Status post right shoulder surgery. EXAM: CT ANGIOGRAPHY CHEST WITH CONTRAST TECHNIQUE: Multidetector CT imaging of the chest was performed using the standard protocol during bolus administration of intravenous contrast. Multiplanar CT image reconstructions and MIPs were obtained to evaluate the vascular anatomy. CONTRAST:  44mL OMNIPAQUE IOHEXOL 350  MG/ML SOLN COMPARISON:  Chest radiograph dated 03/31/2019 FINDINGS: Evaluation of this exam is limited due to respiratory motion artifact. Cardiovascular: There is mild cardiomegaly. No pericardial effusion. Mild atherosclerotic calcification of the thoracic aorta. No aneurysmal dilatation or dissection. The visualized origins of the great vessels of the aortic arch appear patent. Left carotid bulb calcified plaques noted. Evaluation of the pulmonary arteries is limited due to respiratory motion artifact and suboptimal visualization of the peripheral branches. No pulmonary artery embolus identified. Mediastinum/Nodes: There is no hilar or mediastinal adenopathy. The esophagus is grossly unremarkable. There is a 5 mm calcified nodule in the left lobe of the thyroid gland. No mediastinal  fluid collection. Lungs/Pleura: There is eventration of the right hemidiaphragm. Patchy area of consolidative change at the right lung base may represent atelectasis. Pneumonia or aspiration is not excluded. Clinical correlation is recommended. There is a background of emphysema. There is no pleural effusion or pneumothorax. The central airways remain patent. Upper Abdomen: Apparent fatty infiltration of the liver. The visualized upper abdomen is otherwise unremarkable. Musculoskeletal: Postsurgical changes of right shoulder arthroplasty. There are pockets of air in the superficial soft tissues of the right axilla and right chest wall. No drainable fluid collection identified. No acute osseous pathology. Review of the MIP images confirms the above findings. IMPRESSION: 1. No CT evidence of central pulmonary artery embolus. 2. Right lung base atelectasis versus infiltrate. Clinical correlation is recommended. 3. Emphysema. 4. Postsurgical changes of right shoulder arthroplasty. 5. Aortic Atherosclerosis (ICD10-I70.0) and Emphysema (ICD10-J43.9). Electronically Signed   By: Anner Crete M.D.   On: 03/31/2019 23:48   Dg Shoulder Right Port  Result Date: 03/30/2019 CLINICAL DATA:  Status post right shoulder arthroplasty. EXAM: PORTABLE RIGHT SHOULDER COMPARISON:  Radiographs of the right shoulder 10/19/2018 FINDINGS: There has been interval right shoulder arthroplasty. The humeral and glenoid components appear well seated. No adverse features. Degenerative change of the acromioclavicular joint. Ill-defined opacity at the right lung base may reflect atelectasis. IMPRESSION: Interval right shoulder arthroplasty without adverse features. Ill-defined opacity at the right lung base may reflect atelectasis. Pneumonia is difficult to exclude. Electronically Signed   By: Kellie Simmering   On: 03/30/2019 12:56     Subjective: She is feeling well, still with productive cough, improving.  Dyspnea improved. She feels  much better today   Discharge Exam: Vitals:   04/04/19 0739 04/04/19 0911  BP:    Pulse:    Resp:    Temp:    SpO2: 90% 95%     General: Pt is alert, awake, not in acute distress Cardiovascular: RRR, S1/S2 +, no rubs, no gallops Respiratory: CTA bilaterally, no wheezing, no rhonchi Abdominal: Soft, NT, ND, bowel sounds + flank skin hematoma Extremities: no edema, no cyanosis    The results of significant diagnostics from this hospitalization (including imaging, microbiology, ancillary and laboratory) are listed below for reference.     Microbiology: Recent Results (from the past 240 hour(s))  SARS CORONAVIRUS 2 Nasal Swab Aptima Multi Swab     Status: None   Collection Time: 03/26/19  3:30 PM   Specimen: Aptima Multi Swab; Nasal Swab  Result Value Ref Range Status   SARS Coronavirus 2 NEGATIVE NEGATIVE Final    Comment: (NOTE) SARS-CoV-2 target nucleic acids are NOT DETECTED. The SARS-CoV-2 RNA is generally detectable in upper and lower respiratory specimens during the acute phase of infection. Negative results do not preclude SARS-CoV-2 infection, do not rule out co-infections with other pathogens, and should not be  used as the sole basis for treatment or other patient management decisions. Negative results must be combined with clinical observations, patient history, and epidemiological information. The expected result is Negative. Fact Sheet for Patients: SugarRoll.be Fact Sheet for Healthcare Providers: https://www.woods-mathews.com/ This test is not yet approved or cleared by the [place] FDA and  has been authorized for detection and/or diagnosis of SARS-CoV-2 by FDA under an Emergency Use Authorization (EUA). This EUA will remain  in effect (meaning this test can be used) for the duration of the COVID-19 declaration under Section 56 4(b)(1) of the Act, 21 U.S.C. section 360bbb-3(b)(1), unless the authorization is  terminated or revoked sooner. Performed at [facility], [place] [address], [place], [place] [address]   Surgical pcr screen     Status: None   Collection Time: 03/26/19  3:36 PM   Specimen: Nasal Mucosa; Nasal Swab  Result Value Ref Range Status   MRSA, PCR NEGATIVE NEGATIVE Final   Staphylococcus aureus NEGATIVE NEGATIVE Final    Comment: (NOTE) The Xpert SA Assay (FDA approved for NASAL specimens in patients 8 years of age and older), is one component of a comprehensive surveillance program. It is not intended to diagnose infection nor to guide or monitor treatment. Performed at [facility], [place] [address] [place], [place] [address]   SARS Coronavirus 2 [facility] order, Performed in [facility] hospital lab) Nasopharyngeal Nasopharyngeal Swab     Status: None   Collection Time: 04/01/19 12:53 AM   Specimen: Nasopharyngeal Swab  Result Value Ref Range Status   SARS Coronavirus 2 NEGATIVE NEGATIVE Final    Comment: (NOTE) If result is NEGATIVE SARS-CoV-2 target nucleic acids are NOT DETECTED. The SARS-CoV-2 RNA is generally detectable in upper and lower  respiratory specimens during the acute phase of infection. The lowest  concentration of SARS-CoV-2 viral copies this assay can detect is 250  copies / mL. A negative result does not preclude SARS-CoV-2 infection  and should not be used as the sole basis for treatment or other  patient management decisions.  A negative result may occur with  improper specimen collection / handling, submission of specimen other  than nasopharyngeal swab, presence of viral mutation(s) within the  areas targeted by this assay, and inadequate number of viral copies  (<250 copies / mL). A negative result must be combined with clinical  observations, patient history, and epidemiological information. If result is POSITIVE SARS-CoV-2 target nucleic acids are DETECTED. The SARS-CoV-2 RNA is generally detectable in upper and  lower  respiratory specimens dur ing the acute phase of infection.  Positive  results are indicative of active infection with SARS-CoV-2.  Clinical  correlation with patient history and other diagnostic information is  necessary to determine patient infection status.  Positive results do  not rule out bacterial infection or co-infection with other viruses. If result is PRESUMPTIVE POSTIVE SARS-CoV-2 nucleic acids MAY BE PRESENT.   A presumptive positive result was obtained on the submitted specimen  and confirmed on repeat testing.  While 2019 novel coronavirus  (SARS-CoV-2) nucleic acids may be present in the submitted sample  additional confirmatory testing may be necessary for epidemiological  and / or clinical management purposes  to differentiate between  SARS-CoV-2 and other Sarbecovirus currently known to infect humans.  If clinically indicated additional testing with an alternate test  methodology [phone]) is advised. The SARS-CoV-2 RNA is generally  detectable in upper and lower respiratory sp ecimens during the acute  phase of  infection. The expected result is Negative. Fact Sheet for Patients:  StrictlyIdeas.no Fact Sheet for Healthcare Providers: BankingDealers.co.za This test is not yet approved or cleared by the [place] FDA and has been authorized for detection and/or diagnosis of SARS-CoV-2 by FDA under an Emergency Use Authorization (EUA).  This EUA will remain in effect (meaning this test can be used) for the duration of the COVID-19 declaration under Section 564(b)(1) of the Act, 21 U.S.C. section 360bbb-3(b)(1), unless the authorization is terminated or revoked sooner. Performed at [facility], [place] [address] [place], [place] [address]      Labs: BNP (last 3 results) Recent Labs    04/01/19 0731  BNP 678.9*   Basic Metabolic Panel: Recent Labs  Lab 03/31/19 0222 03/31/19 2252  04/01/19 0731 04/02/19 0811  NA 133* 134* 136 140  K 4.1 3.2* 3.3* 4.2  CL 103 103 106 107  CO2 22 22 21* 21*  GLUCOSE 118* 112* 124* 102*  BUN 26* 28* 20 14  CREATININE 1.29* 1.43* 0.91 0.88  CALCIUM 8.0* 8.1* 7.6* 8.4*  MG  --   --  2.0  --    Liver Function Tests: Recent Labs  Lab 04/01/19 0731 04/03/19 1010  AST 45* 45*  ALT 23 39  ALKPHOS 107 162*  BILITOT 0.3 0.7  PROT 6.1* 6.9  ALBUMIN 2.7* 2.9*   No results for input(s): LIPASE, AMYLASE in the last 168 hours. No results for input(s): AMMONIA in the last 168 hours. CBC: Recent Labs  Lab 03/31/19 2252 04/01/19 0731 04/02/19 0811 04/03/19 0501 04/04/19 0501  WBC 11.1* 9.5 7.7 9.1 8.8  NEUTROABS 7.3 7.3  --   --   --   HGB 8.5* 8.1* 8.3* 7.7* 11.6*  HCT 26.4* 25.0* 25.9* 24.6* 36.1  MCV 94.3 93.3 94.2 93.5 93.3  PLT 289 253 264 313 322   Cardiac Enzymes: No results for input(s): CKTOTAL, CKMB, CKMBINDEX, TROPONINI in the last 168 hours. BNP: Invalid input(s): POCBNP CBG: No results for input(s): GLUCAP in the last 168 hours. D-Dimer No results for input(s): DDIMER in the last 72 hours. Hgb A1c No results for input(s): HGBA1C in the last 72 hours. Lipid Profile No results for input(s): CHOL, HDL, LDLCALC, TRIG, CHOLHDL, LDLDIRECT in the last 72 hours. Thyroid function studies No results for input(s): TSH, T4TOTAL, T3FREE, THYROIDAB in the last 72 hours.  Invalid input(s): FREET3 Anemia work up Recent Labs    04/01/19 0959  VITAMINB12 291  FOLATE 17.1  FERRITIN 39  TIBC 255  IRON 17*  RETICCTPCT 1.7   Urinalysis    Component Value Date/Time   COLORURINE YELLOW 07/02/2017 1016   APPEARANCEUR CLEAR 07/02/2017 1016   LABSPEC 1.020 07/02/2017 1016   PHURINE 7.0 07/02/2017 1016   GLUCOSEU NEGATIVE 07/02/2017 1016   HGBUR NEGATIVE 07/02/2017 1016   BILIRUBINUR NEGATIVE 07/02/2017 1016   BILIRUBINUR negative 02/18/2017 1121   BILIRUBINUR neg 04/29/2015 1431   KETONESUR NEGATIVE 07/02/2017  1016   PROTEINUR negative 02/18/2017 1121   PROTEINUR NEGATIVE 03/05/2016 1559   UROBILINOGEN 0.2 07/02/2017 1016   NITRITE NEGATIVE 07/02/2017 1016   LEUKOCYTESUR NEGATIVE 07/02/2017 1016   Sepsis Labs Invalid input(s): PROCALCITONIN,  WBC,  LACTICIDVEN Microbiology Recent Results (from the past 240 hour(s))  SARS CORONAVIRUS 2 Nasal Swab Aptima Multi Swab     Status: None   Collection Time: 03/26/19  3:30 PM   Specimen: Aptima Multi Swab; Nasal Swab  Result Value Ref Range Status   SARS  Coronavirus 2 NEGATIVE NEGATIVE Final    Comment: (NOTE) SARS-CoV-2 target nucleic acids are NOT DETECTED. The SARS-CoV-2 RNA is generally detectable in upper and lower respiratory specimens during the acute phase of infection. Negative results do not preclude SARS-CoV-2 infection, do not rule out co-infections with other pathogens, and should not be used as the sole basis for treatment or other patient management decisions. Negative results must be combined with clinical observations, patient history, and epidemiological information. The expected result is Negative. Fact Sheet for Patients: SugarRoll.be Fact Sheet for Healthcare Providers: https://www.woods-mathews.com/ This test is not yet approved or cleared by the [place] FDA and  has been authorized for detection and/or diagnosis of SARS-CoV-2 by FDA under an Emergency Use Authorization (EUA). This EUA will remain  in effect (meaning this test can be used) for the duration of the COVID-19 declaration under Section 56 4(b)(1) of the Act, 21 U.S.C. section 360bbb-3(b)(1), unless the authorization is terminated or revoked sooner. Performed at [facility], [place] [address] [place], [place] [address]   Surgical pcr screen     Status: None   Collection Time: 03/26/19  3:36 PM   Specimen: Nasal Mucosa; Nasal Swab  Result Value Ref Range Status   MRSA, PCR NEGATIVE NEGATIVE Final    Staphylococcus aureus NEGATIVE NEGATIVE Final    Comment: (NOTE) The Xpert SA Assay (FDA approved for NASAL specimens in patients 74 years of age and older), is one component of a comprehensive surveillance program. It is not intended to diagnose infection nor to guide or monitor treatment. Performed at [facility], [place] [address] [place], [place] [address]   SARS Coronavirus 2 [facility] order, Performed in [facility] hospital lab) Nasopharyngeal Nasopharyngeal Swab     Status: None   Collection Time: 04/01/19 12:53 AM   Specimen: Nasopharyngeal Swab  Result Value Ref Range Status   SARS Coronavirus 2 NEGATIVE NEGATIVE Final    Comment: (NOTE) If result is NEGATIVE SARS-CoV-2 target nucleic acids are NOT DETECTED. The SARS-CoV-2 RNA is generally detectable in upper and lower  respiratory specimens during the acute phase of infection. The lowest  concentration of SARS-CoV-2 viral copies this assay can detect is 250  copies / mL. A negative result does not preclude SARS-CoV-2 infection  and should not be used as the sole basis for treatment or other  patient management decisions.  A negative result may occur with  improper specimen collection / handling, submission of specimen other  than nasopharyngeal swab, presence of viral mutation(s) within the  areas targeted by this assay, and inadequate number of viral copies  (<250 copies / mL). A negative result must be combined with clinical  observations, patient history, and epidemiological information. If result is POSITIVE SARS-CoV-2 target nucleic acids are DETECTED. The SARS-CoV-2 RNA is generally detectable in upper and lower  respiratory specimens dur ing the acute phase of infection.  Positive  results are indicative of active infection with SARS-CoV-2.  Clinical  correlation with patient history and other diagnostic information is  necessary to determine patient infection status.  Positive results do  not  rule out bacterial infection or co-infection with other viruses. If result is PRESUMPTIVE POSTIVE SARS-CoV-2 nucleic acids MAY BE PRESENT.   A presumptive positive result was obtained on the submitted specimen  and confirmed on repeat testing.  While 2019 novel coronavirus  (SARS-CoV-2) nucleic acids may be present in the submitted sample  additional confirmatory testing may be necessary for epidemiological  and /  or clinical management purposes  to differentiate between  SARS-CoV-2 and other Sarbecovirus currently known to infect humans.  If clinically indicated additional testing with an alternate test  methodology [phone]) is advised. The SARS-CoV-2 RNA is generally  detectable in upper and lower respiratory sp ecimens during the acute  phase of infection. The expected result is Negative. Fact Sheet for Patients:  StrictlyIdeas.no Fact Sheet for Healthcare Providers: BankingDealers.co.za This test is not yet approved or cleared by the [place] FDA and has been authorized for detection and/or diagnosis of SARS-CoV-2 by FDA under an Emergency Use Authorization (EUA).  This EUA will remain in effect (meaning this test can be used) for the duration of the COVID-19 declaration under Section 564(b)(1) of the Act, 21 U.S.C. section 360bbb-3(b)(1), unless the authorization is terminated or revoked sooner. Performed at [facility], [place] [address] [place], [place] [address]      Time coordinating discharge: 40 minutes  SIGNED:   Elmarie Shiley, MD  [facility]

## 2019-04-04 NOTE — Discharge Instructions (Signed)
These are the instructions from your previous DC ...  Ice to the shoulder constantly.  Keep the incision covered and clean and dry for one week, then ok to get it wet in the shower.  Do exercise as instructed several times per day.  DO NOT reach behind your back or push up out of a chair with the operative arm.  Use a sling while you are up and around for comfort, may remove while seated.  Keep pillow propped behind the operative elbow.  Follow up with Dr Veverly Fells in two weeks in the office, call [phone] for appt

## 2019-04-06 ENCOUNTER — Telehealth: Payer: Self-pay | Admitting: *Deleted

## 2019-04-07 NOTE — Telephone Encounter (Signed)
error 

## 2019-04-09 NOTE — Assessment & Plan Note (Addendum)
Losartan held in the hospital secondary to dizziness, but she was not aware that she was supposed to discontinue it and has been taking it Also taking hydrochlorothiazide Blood pressure good here and dizziness has improved so she will continue both of her medications since blood pressure looks good BMP

## 2019-04-09 NOTE — Assessment & Plan Note (Addendum)
Secondary to shoulder surgery 03/2019 and hematoma right flank Received 2 units PRBC 8/14 Received IV iron Received B12 injection x1, started on oral B12 Continue oral iron and oral B12 for now Check CBC, BMP Advised her to follow-up sooner if she experiences any shortness of breath or unusual fatigue

## 2019-04-09 NOTE — Patient Instructions (Addendum)
  Tests ordered today. Your results will be released to MyChart (or called to you) after review.  If any changes need to be made, you will be notified at that same time.    Medications reviewed and updated.  Changes include :   none     Please followup in 6 months   

## 2019-04-09 NOTE — Progress Notes (Signed)
Subjective:    Patient ID: Sheryl [name], female    DOB: 04-18-1951, 68 y.o.   MRN: [id number]  HPI The patient is here for follow up from the hospital.   Admitted 03/31/19 - 04/04/19    Recommendations for Outpatient Follow-up:  1. Follow up with PCP in 1-2 weeks 2. Please obtain BMP/CBC in one week 3. Needs repeat Hb level. Further evaluation of iron deficiency anemia./  4. Follow up on resolution of PNA.  5. Follow up with ortho post op shoulder sx.  6. Consider resuming cozaar if BP increases.    She had surgery for her right shoulder rotator cuff tear on 8/9 and was discharged to home 8/10.  After going home she had SOB with minimal exertion with productive cough.  She denied fever/chillls.  She denied blood in the stools or bleeding.   ED:  She was hypoxic with minimal exertion.  Ct angiogram of the chest showed possible PNA, neg for PE.  COVID-19 test was neg.  She had a significant drop in hemoglobin.  Stool for occult blood was negative.  She was started on empiric antibiotics for PNA and admitted for acute respiratory failure.  EKG showed NSR.     Acute hypoxic respiratory failure likely secondary to PNA and acute blood loss: Was on supplemental oxygen-weaned off during hospitalization received IV Abx incentive spirometry  Pneumonia: Received IV ceftriaxone ( 4 days) and Azithro ( 3 days) and discharged on Augmentin, Azithromycin x 3 days She completed the antibiotics She states she is still coughing and bringing up some sputum and still has some wheezing and shortness of breath.  All of her symptoms have improved.  She denies any fevers or chills. She has quit smoking  Acute blood loss anemia, iron def anemia: Received IV iron Received B12 injection x 1, start oral supplement Received 2 units PRBC 8/14.  Hemoglobin increased appropriately to 11 No evidence of hemolysis or active bleeding Hematoma right flank-same site of right shoulder surgery, which could  have explain blood loss.  Advised to apply ice and monitor closely Ortho agreed okay to continue aspirin Hemoglobin stable before discharge  Dizziness: Orthostatic negative Losartan held - she restarted it when she got home-she was not aware that she was supposed to discontinue it Improved after blood transfusion She had vertigo all summer, but she states it was worse while in the hospital.  Since coming home it has been better  COPD: Continue inhalers - uses Anoro daily Has not used albuterol  Chronic kidney disease, stage III: Stable  Hypertension: Losartan held, but she restarted it when she got home Resume hydrochlorothiazide to avoid fluid retention  Hyperlipidemia: Continue statin  Rotator cuff surgery Will follow-up with orthopedics    She has stopped smoking, but is concerned about restarting.  She is under a lot of stress and is concerned she will restart even though she does not want to right now.  Overall she is feeling better   Medications and allergies reviewed with patient and updated if appropriate.  Patient Active Problem List   Diagnosis Date Noted  . Acute respiratory failure with hypoxia ([place]) 04/01/2019  . Community acquired pneumonia 04/01/2019  . Acute blood loss anemia 04/01/2019  . 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 Vertigo 06/05/2018  . 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 CKD (chronic kidney disease) 12/27/2016  . Insomnia 06/29/2016  . Prediabetes 06/29/2016  . 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  COPD (chronic obstructive pulmonary disease) ([place]) 12/09/2014  . Lumbago 07/16/2014  . Bilateral hand pain 07/16/2014  . HTN (hypertension) 06/04/2012  . Hyperlipidemia 06/04/2012  . Depression 06/04/2012  . ADD (attention deficit disorder) 06/04/2012  . AR (allergic rhinitis) 06/04/2012    Current Outpatient Medications on File Prior to Visit  Medication Sig Dispense Refill  .  albuterol (PROVENTIL HFA;VENTOLIN HFA) 108 (90 Base) MCG/ACT inhaler Inhale 1-2 puffs into the lungs every 6 (six) hours as needed for wheezing or shortness of breath. 1 Inhaler 3  . ANORO ELLIPTA 62.5-25 MCG/INH AEPB TAKE 1 PUFF BY MOUTH EVERY DAY (Patient taking differently: Inhale 1 puff into the lungs daily. ) 60 each 5  . aspirin 81 MG chewable tablet Chew 162 mg by mouth daily.    Marland Kitchen atomoxetine (STRATTERA) 100 MG capsule TAKE 1 CAPSULE BY MOUTH EVERY DAY (Patient taking differently: Take 100 mg by mouth daily. ) 90 capsule 1  . ferrous sulfate 325 (65 FE) MG tablet Take 1 tablet (325 mg total) by mouth 2 (two) times daily with a meal. 30 tablet 3  . FLUoxetine (PROZAC) 40 MG capsule Take 1 capsule (40 mg total) by mouth daily. In addition to 20 mg for total of 60 mg daily 90 capsule 1  . hydrochlorothiazide (HYDRODIURIL) 12.5 MG tablet Take 1 tablet (12.5 mg total) by mouth daily. 90 tablet 1  . HYDROcodone-acetaminophen (NORCO) 5-325 MG tablet Take 1 tablet by mouth every 4 (four) hours as needed for moderate pain or severe pain. 25 tablet 0  . meclizine (ANTIVERT) 25 MG tablet Take 1 tablet (25 mg total) by mouth 3 (three) times daily as needed for dizziness. 30 tablet 0  . rosuvastatin (CRESTOR) 10 MG tablet Take 1 tablet (10 mg total) by mouth daily. 90 tablet 1  . traMADol (ULTRAM) 50 MG tablet Take 1 tablet (50 mg total) by mouth every 6 (six) hours as needed. (Patient taking differently: Take 50 mg by mouth every 6 (six) hours as needed for moderate pain. ) 15 tablet 0  . traZODone (DESYREL) 50 MG tablet TAKE 1 TABLET BY MOUTH EVERYDAY AT BEDTIME (Patient taking differently: Take 50 mg by mouth at bedtime. ) 90 tablet 3  . TURMERIC PO Take 1,000 mg by mouth daily.     . vitamin B-12 100 MCG tablet Take 1 tablet (100 mcg total) by mouth daily. 30 tablet 0  . ZINC PICOLINATE PO Take 50 mg by mouth daily.     No current facility-administered medications on file prior to visit.     Past  Medical History:  Diagnosis Date  . ADD (attention deficit disorder)   . Allergy   . Arthritis   . CKD (chronic kidney disease), stage III ([place])   . COPD (chronic obstructive pulmonary disease) ([place]) 12/09/2014  . Depression   . Does use hearing aid   . Dyspnea    on exertion  . Hyperlipidemia   . Hypertension   . LGSIL (low grade squamous intraepithelial dysplasia)   . Osteoporosis   . Pre-diabetes     Past Surgical History:  Procedure Laterality Date  . JOINT REPLACEMENT     right knee  . MULTIPLE TOOTH EXTRACTIONS    . REPLACEMENT TOTAL KNEE  2013   right  . REVERSE SHOULDER ARTHROPLASTY Right 03/30/2019   Procedure: REVERSE TOTAL SHOULDER ARTHROPLASTY;  Surgeon: Netta Cedars, MD;  Location: [facility];  Service: Orthopedics;  Laterality: Right;  interscalene block  . TOTAL KNEE ARTHROPLASTY  Left 12/27/2017  . TOTAL KNEE ARTHROPLASTY Left 12/27/2017   Procedure: LEFT TOTAL KNEE ARTHROPLASTY;  Surgeon: Netta Cedars, MD;  Location: [facility];  Service: Orthopedics;  Laterality: Left;    Social History   Socioeconomic History  . Marital status: Divorced    Spouse name: Not on file  . Number of children: 2  . Years of education: Not on file  . Highest education level: Not on file  Occupational History  . Not on file  Social Needs  . Financial resource strain: Not hard at all  . Food insecurity    Worry: Never true    Inability: Never true  . Transportation needs    Medical: No    Non-medical: No  Tobacco Use  . Smoking status: Current Every Day Smoker    Packs/day: 0.25    Years: 49.00    Pack years: 12.25    Types: Cigarettes    Last attempt to quit: 06/02/2018    Years since quitting: 0.8  . Smokeless tobacco: Never Used  . Tobacco comment: Trying quit smoking!, resumed    Substance and Sexual Activity  . Alcohol use: No    Alcohol/week: 0.0 standard drinks  . Drug use: No  . Sexual activity: Not Currently  Lifestyle  . Physical activity    Days per week: 0  days    Minutes per session: 0 min  . Stress: Not at all  Relationships  . Social connections    Talks on phone: More than three times a week    Gets together: More than three times a week    Attends religious service: 1 to 4 times per year    Active member of club or organization: Yes    Attends meetings of clubs or organizations: More than 4 times per year    Relationship status: Divorced  Other Topics Concern  . Not on file  Social History Narrative  . Not on file    Family History  Problem Relation Age of Onset  . Hyperlipidemia Mother   . Alcohol abuse Father   . Arthritis Father   . Colon polyps Father   . Alcohol abuse Maternal Uncle   . Stroke Maternal Grandmother     Review of Systems  Constitutional: Negative for chills and fever.  Respiratory: Positive for cough (productive, improving), shortness of breath (a little - better) and wheezing (a little).   Cardiovascular: Negative for chest pain, palpitations and leg swelling.  Gastrointestinal: Negative for abdominal pain, constipation, diarrhea and nausea.  Neurological: Positive for dizziness, light-headedness and headaches.       Objective:   Vitals:   04/10/19 0959  BP: 132/80  Pulse: 100  Resp: 16  Temp: 98 F (36.7 C)  SpO2: 96%   BP Readings from Last 3 Encounters:  04/10/19 132/80  04/04/19 131/68  03/31/19 (!) 106/44   Wt Readings from Last 3 Encounters:  04/10/19 146 lb (66.2 kg)  04/01/19 143 lb 4.8 oz (65 kg)  03/30/19 138 lb (62.6 kg)   Body mass index is 27.59 kg/m.   Physical Exam    Constitutional: Appears well-developed and well-nourished. No distress.  HENT:  Head: Normocephalic and atraumatic.  Neck: Neck supple. No tracheal deviation present. No thyromegaly present.  No cervical lymphadenopathy Cardiovascular: Normal rate, regular rhythm and normal heart sounds.  No murmur heard. No carotid bruit .  No edema Pulmonary/Chest: Effort normal and breath sounds normal. No  respiratory distress. No has no wheezes. No rales.  Abdomen: Soft, nontender, nondistended Skin: Skin is warm and dry. Not diaphoretic.  Residual hematoma right flank-no swelling or tenderness Psychiatric: Normal mood and affect. Behavior is normal.      Assessment & Plan:    See Problem List for Assessment and Plan of chronic medical problems.

## 2019-04-09 NOTE — Assessment & Plan Note (Addendum)
Secondary to acute blood loss and pneumonia Treated with antibiotics, supplemental oxygen Received 2 units PRBC Improved while in the hospital and discharged without oxygen.  Oxygen saturation 96% on room air here today Cough, wheeze and shortness of breath improving Using incentive spirometry-she will continue BMP, CBC

## 2019-04-10 ENCOUNTER — Other Ambulatory Visit: Payer: Self-pay

## 2019-04-10 ENCOUNTER — Other Ambulatory Visit (INDEPENDENT_AMBULATORY_CARE_PROVIDER_SITE_OTHER): Payer: Medicare Other

## 2019-04-10 ENCOUNTER — Encounter: Payer: Self-pay | Admitting: Internal Medicine

## 2019-04-10 ENCOUNTER — Ambulatory Visit (INDEPENDENT_AMBULATORY_CARE_PROVIDER_SITE_OTHER): Payer: Medicare Other | Admitting: Internal Medicine

## 2019-04-10 VITALS — BP 132/80 | HR 100 | Temp 98.0°F | Resp 16 | Ht 61.0 in | Wt 146.0 lb

## 2019-04-10 DIAGNOSIS — J189 Pneumonia, unspecified organism: Secondary | ICD-10-CM

## 2019-04-10 DIAGNOSIS — N183 Chronic kidney disease, stage 3 unspecified: Secondary | ICD-10-CM

## 2019-04-10 DIAGNOSIS — R42 Dizziness and giddiness: Secondary | ICD-10-CM

## 2019-04-10 DIAGNOSIS — D62 Acute posthemorrhagic anemia: Secondary | ICD-10-CM | POA: Diagnosis not present

## 2019-04-10 DIAGNOSIS — I10 Essential (primary) hypertension: Secondary | ICD-10-CM

## 2019-04-10 DIAGNOSIS — J9601 Acute respiratory failure with hypoxia: Secondary | ICD-10-CM

## 2019-04-10 LAB — BASIC METABOLIC PANEL
BUN: 24 mg/dL — ABNORMAL HIGH (ref 6–23)
CO2: 26 mEq/L (ref 19–32)
Calcium: 9.8 mg/dL (ref 8.4–10.5)
Chloride: 101 mEq/L (ref 96–112)
Creatinine, Ser: 1.15 mg/dL (ref 0.40–1.20)
GFR: 46.83 mL/min — ABNORMAL LOW (ref >60.00)
Glucose, Bld: 114 mg/dL — ABNORMAL HIGH (ref 70–99)
Potassium: 3.9 mEq/L (ref 3.5–5.1)
Sodium: 136 mEq/L (ref 135–145)

## 2019-04-10 LAB — CBC WITH DIFFERENTIAL/PLATELET
Basophils Absolute: 0.1 10*3/uL (ref 0.0–0.1)
Basophils Relative: 0.9 % (ref 0.0–3.0)
Eosinophils Absolute: 0.6 10*3/uL (ref 0.0–0.7)
Eosinophils Relative: 9.9 % — ABNORMAL HIGH (ref 0.0–5.0)
HCT: 38.5 % (ref 36.0–46.0)
Hemoglobin: 12.8 g/dL (ref 12.0–15.0)
Lymphocytes Relative: 22.8 % (ref 12.0–46.0)
Lymphs Abs: 1.4 10*3/uL (ref 0.7–4.0)
MCHC: 33.3 g/dL (ref 30.0–36.0)
MCV: 92.6 fl (ref 78.0–100.0)
Monocytes Absolute: 0.5 10*3/uL (ref 0.1–1.0)
Monocytes Relative: 8.4 % (ref 3.0–12.0)
Neutro Abs: 3.6 10*3/uL (ref 1.4–7.7)
Neutrophils Relative %: 58 % (ref 43.0–77.0)
Platelets: 375 10*3/uL (ref 150.0–400.0)
RBC: 4.16 Mil/uL (ref 3.87–5.11)
RDW: 14.9 % (ref 11.5–15.5)
WBC: 6.3 10*3/uL (ref 4.0–10.5)

## 2019-04-10 MED ORDER — LOSARTAN POTASSIUM 100 MG PO TABS: 100.0000 mg | ORAL_TABLET | Freq: Every day | ORAL | 1 refills | Status: DC

## 2019-04-10 NOTE — Assessment & Plan Note (Signed)
Completed antibiotic she was discharged on Oxygen saturation here 96% on room air She still has some productive cough, wheeze and shortness of breath, but all have improved and clinically she is doing much better Continue incentive spirometry Continue Anoro inhaler daily, advised to use albuterol inhaler if needed Stressed that she needs to call if her symptoms do not continue to improve

## 2019-04-10 NOTE — Assessment & Plan Note (Signed)
Chronic, but got worse while in the hospital-likely related to anemia Improved when she left the hospital Unlikely related to losartan, which she did restart We will continue losartan and hydrochlorothiazide Monitor vertigo-can use meclizine if needed

## 2019-04-10 NOTE — Assessment & Plan Note (Signed)
BMP today Was stable in hospital

## 2019-04-11 ENCOUNTER — Encounter: Payer: Self-pay | Admitting: Internal Medicine

## 2019-05-02 ENCOUNTER — Other Ambulatory Visit: Payer: Self-pay | Admitting: Internal Medicine

## 2019-05-30 ENCOUNTER — Other Ambulatory Visit: Payer: Self-pay | Admitting: Internal Medicine

## 2019-06-25 ENCOUNTER — Other Ambulatory Visit: Payer: Self-pay | Admitting: Internal Medicine

## 2019-06-25 DIAGNOSIS — E7800 Pure hypercholesterolemia, unspecified: Secondary | ICD-10-CM

## 2019-07-07 NOTE — Progress Notes (Deleted)
Subjective:   Sheryl [name] is a 68 y.o. female who presents for Medicare Annual (Subsequent) preventive examination.  Review of Systems:     Sleep patterns: {SX; SLEEP PATTERNS:18802::"feels rested on waking","does not get up to void","gets up *** times nightly to void","sleeps *** hours nightly"}.    Home Safety/Smoke Alarms: Feels safe in home. Smoke alarms in place.  Living environment; residence and Firearm Safety: {Rehab home environment / accessibility:30080::"no firearms","firearms stored safely"}. Seat Belt Safety/Bike Helmet: Wears seat belt.      Objective:     Vitals: There were no vitals taken for this visit.  There is no height or weight on file to calculate BMI.  Advanced Directives 04/01/2019 03/31/2019 03/30/2019 03/30/2019 03/26/2019 10/19/2018 07/03/2018  Does Patient Have a Medical Advance Directive? No Yes Yes Yes Yes No No  Type of Advance Directive Living will Living will Living will Living will Living will - -  Does patient want to make changes to medical advance directive? No - Patient declined No - Patient declined No - Patient declined No - Patient declined No - Patient declined - -  Would patient like information on creating a medical advance directive? No - Patient declined No - Patient declined - - - - No - Patient declined    Tobacco Social History   Tobacco Use  Smoking Status Current Every Day Smoker  . Packs/day: 0.25  . Years: 49.00  . Pack years: 12.25  . Types: Cigarettes  . Last attempt to quit: 06/02/2018  . Years since quitting: 1.0  Smokeless Tobacco Never Used  Tobacco Comment   Trying quit smoking!, resumed       Ready to quit: Not Answered Counseling given: Not Answered Comment: Trying quit smoking!, resumed    Past Medical History:  Diagnosis Date  . ADD (attention deficit disorder)   . Allergy   . Arthritis   . CKD (chronic kidney disease), stage III ([place])   . COPD (chronic obstructive pulmonary disease) ([place]) 12/09/2014  .  Depression   . Does use hearing aid   . Dyspnea    on exertion  . Hyperlipidemia   . Hypertension   . LGSIL (low grade squamous intraepithelial dysplasia)   . Osteoporosis   . Pre-diabetes    Past Surgical History:  Procedure Laterality Date  . JOINT REPLACEMENT     right knee  . MULTIPLE TOOTH EXTRACTIONS    . REPLACEMENT TOTAL KNEE  2013   right  . REVERSE SHOULDER ARTHROPLASTY Right 03/30/2019   Procedure: REVERSE TOTAL SHOULDER ARTHROPLASTY;  Surgeon: Netta Cedars, MD;  Location: [facility];  Service: Orthopedics;  Laterality: Right;  interscalene block  . TOTAL KNEE ARTHROPLASTY Left 12/27/2017  . TOTAL KNEE ARTHROPLASTY Left 12/27/2017   Procedure: LEFT TOTAL KNEE ARTHROPLASTY;  Surgeon: Netta Cedars, MD;  Location: [facility];  Service: Orthopedics;  Laterality: Left;   Family History  Problem Relation Age of Onset  . Hyperlipidemia Mother   . Alcohol abuse Father   . Arthritis Father   . Colon polyps Father   . Alcohol abuse Maternal Uncle   . Stroke Maternal Grandmother    Social History   Socioeconomic History  . Marital status: Divorced    Spouse name: Not on file  . Number of children: 2  . Years of education: Not on file  . Highest education level: Not on file  Occupational History  . Not on file  Social Needs  . Financial resource strain: Not hard at all  .  Food insecurity    Worry: Never true    Inability: Never true  . Transportation needs    Medical: No    Non-medical: No  Tobacco Use  . Smoking status: Current Every Day Smoker    Packs/day: 0.25    Years: 49.00    Pack years: 12.25    Types: Cigarettes    Last attempt to quit: 06/02/2018    Years since quitting: 1.0  . Smokeless tobacco: Never Used  . Tobacco comment: Trying quit smoking!, resumed    Substance and Sexual Activity  . Alcohol use: No    Alcohol/week: 0.0 standard drinks  . Drug use: No  . Sexual activity: Not Currently  Lifestyle  . Physical activity    Days per week: 0 days     Minutes per session: 0 min  . Stress: Not at all  Relationships  . Social connections    Talks on phone: More than three times a week    Gets together: More than three times a week    Attends religious service: 1 to 4 times per year    Active member of club or organization: Yes    Attends meetings of clubs or organizations: More than 4 times per year    Relationship status: Divorced  Other Topics Concern  . Not on file  Social History Narrative  . Not on file    Outpatient Encounter Medications as of 07/08/2019  Medication Sig  . albuterol (PROVENTIL HFA;VENTOLIN HFA) 108 (90 Base) MCG/ACT inhaler Inhale 1-2 puffs into the lungs every 6 (six) hours as needed for wheezing or shortness of breath.  Jearl Klinefelter ELLIPTA 62.5-25 MCG/INH AEPB TAKE 1 PUFF BY MOUTH EVERY DAY (Patient taking differently: Inhale 1 puff into the lungs daily. )  . aspirin 81 MG chewable tablet Chew 162 mg by mouth daily.  Marland Kitchen atomoxetine (STRATTERA) 100 MG capsule TAKE 1 CAPSULE BY MOUTH EVERY DAY (Patient taking differently: Take 100 mg by mouth daily. )  . ferrous sulfate 325 (65 FE) MG tablet Take 1 tablet (325 mg total) by mouth 2 (two) times daily with a meal.  . FLUoxetine (PROZAC) 20 MG capsule Take 1 capsule (20 mg total) by mouth daily in addition to 40 mg for a total of 60 mg.  . FLUoxetine (PROZAC) 40 MG capsule Take 1 capsule (40 mg total) by mouth daily. In addition to 20 mg for total of 60 mg daily  . hydrochlorothiazide (HYDRODIURIL) 12.5 MG tablet TAKE 1 TABLET BY MOUTH EVERY DAY  . HYDROcodone-acetaminophen (NORCO) 5-325 MG tablet Take 1 tablet by mouth every 4 (four) hours as needed for moderate pain or severe pain.  Marland Kitchen losartan (COZAAR) 100 MG tablet TAKE 1 TABLET BY MOUTH EVERY DAY  . meclizine (ANTIVERT) 25 MG tablet Take 1 tablet (25 mg total) by mouth 3 (three) times daily as needed for dizziness.  . rosuvastatin (CRESTOR) 10 MG tablet TAKE 1 TABLET BY MOUTH EVERY DAY  . traMADol (ULTRAM) 50 MG  tablet Take 1 tablet (50 mg total) by mouth every 6 (six) hours as needed. (Patient taking differently: Take 50 mg by mouth every 6 (six) hours as needed for moderate pain. )  . traZODone (DESYREL) 50 MG tablet TAKE 1 TABLET BY MOUTH EVERYDAY AT BEDTIME (Patient taking differently: Take 50 mg by mouth at bedtime. )  . TURMERIC PO Take 1,000 mg by mouth daily.   . vitamin B-12 100 MCG tablet Take 1 tablet (100 mcg total) by mouth  daily.  Marland Kitchen ZINC PICOLINATE PO Take 50 mg by mouth daily.   No facility-administered encounter medications on file as of 07/08/2019.     Activities of Daily Living In your present state of health, do you have any difficulty performing the following activities: 04/01/2019 03/30/2019  Hearing? N Y  Vision? N N  Difficulty concentrating or making decisions? N N  Walking or climbing stairs? Y N  Dressing or bathing? Y N  Doing errands, shopping? N N  Some recent data might be hidden    Patient Care Team: Binnie Rail, MD as PCP - General (Internal Medicine)    Assessment:   This is a routine wellness examination for Kalen. Physical assessment deferred to PCP.  Exercise Activities and Dietary recommendations   Diet (meal preparation, eat out, water intake, caffeinated beverages, dairy products, fruits and vegetables): {Desc; diets:16563}   Goals    . Patient Stated     Increase my physical activity by starting to use pilates machine I have and start to go to the gym.       Fall Risk Fall Risk  04/10/2019 07/03/2018 07/02/2017 06/19/2017 02/18/2017  Falls in the past year? 0 0 No No No  Number falls in past yr: 0 - - - -  Injury with Fall? - - - - -   Is the patient's home free of loose throw rugs in walkways, pet beds, electrical cords, etc?   {Blank single:19197::"yes","no"}      Grab bars in the bathroom? {Blank single:19197::"yes","no"}      Handrails on the stairs?   {Blank single:19197::"yes","no"}      Adequate lighting?   {Blank  single:19197::"yes","no"}  Depression Screen PHQ 2/9 Scores 04/10/2019 07/03/2018 07/02/2017 06/19/2017  PHQ - 2 Score 2 1 2  0  PHQ- 9 Score 9 5 3  -     Cognitive Function MMSE - Mini Mental State Exam 07/02/2017  Orientation to time 5  Orientation to Place 5  Registration 3  Attention/ Calculation 4  Recall 2  Language- name 2 objects 2  Language- repeat 1  Language- follow 3 step command 3  Language- read & follow direction 1  Write a sentence 1  Copy design 1  Total score 28        Immunization History  Administered Date(s) Administered  . Influenza Split 06/04/2012  . Influenza, High Dose Seasonal PF 06/29/2016, 06/19/2017, 05/06/2018  . Influenza, Seasonal, Injecte, Preservative Fre 07/19/2013  . Influenza,inj,Quad PF,6+ Mos 06/01/2015  . Pneumococcal Conjugate-13 07/19/2013  . Pneumococcal Polysaccharide-23 11/09/2015  . Td 09/22/2014  . Tdap 03/12/2019  . Zoster Recombinat (Shingrix) 03/12/2019    Screening Tests Health Maintenance  Topic Date Due  . DEXA SCAN  08/06/2015  . MAMMOGRAM  02/19/2019  . INFLUENZA VACCINE  11/18/2019 (Originally 03/21/2019)  . COLONOSCOPY  09/10/2022  . TETANUS/TDAP  03/11/2029  . Hepatitis C Screening  Completed  . PNA vac Low Risk Adult  Completed      Plan:    I have personally reviewed and noted the following in the patient's chart:   . Medical and social history . Use of alcohol, tobacco or illicit drugs  . Current medications and supplements . Functional ability and status . Nutritional status . Physical activity . Advanced directives . List of other physicians . Vitals . Screenings to include cognitive, depression, and falls . Referrals and appointments  In addition, I have reviewed and discussed with patient certain preventive protocols, quality metrics, and best practice recommendations.  A written personalized care plan for preventive services as well as general preventive health recommendations were  provided to patient.     Michiel Cowboy, RN  07/07/2019

## 2019-07-08 ENCOUNTER — Ambulatory Visit: Payer: Medicare Other

## 2019-08-04 ENCOUNTER — Other Ambulatory Visit: Payer: Self-pay | Admitting: Internal Medicine

## 2019-08-07 ENCOUNTER — Other Ambulatory Visit: Payer: Self-pay | Admitting: Internal Medicine

## 2019-08-07 DIAGNOSIS — F988 Other specified behavioral and emotional disorders with onset usually occurring in childhood and adolescence: Secondary | ICD-10-CM

## 2019-08-24 ENCOUNTER — Telehealth: Payer: Self-pay

## 2019-08-24 NOTE — Telephone Encounter (Signed)
Received a message that patient wanted to schedule visit for possible vertigo and/or issues with inner ear. Called and LVM for patient to return call to schedule.

## 2019-08-28 DIAGNOSIS — Z20828 Contact with and (suspected) exposure to other viral communicable diseases: Secondary | ICD-10-CM | POA: Diagnosis not present

## 2019-09-17 DIAGNOSIS — H04123 Dry eye syndrome of bilateral lacrimal glands: Secondary | ICD-10-CM | POA: Diagnosis not present

## 2019-09-17 DIAGNOSIS — H40013 Open angle with borderline findings, low risk, bilateral: Secondary | ICD-10-CM | POA: Diagnosis not present

## 2019-09-17 DIAGNOSIS — H524 Presbyopia: Secondary | ICD-10-CM | POA: Diagnosis not present

## 2019-09-17 DIAGNOSIS — H52203 Unspecified astigmatism, bilateral: Secondary | ICD-10-CM | POA: Diagnosis not present

## 2019-09-17 DIAGNOSIS — H25813 Combined forms of age-related cataract, bilateral: Secondary | ICD-10-CM | POA: Diagnosis not present

## 2019-09-17 DIAGNOSIS — H5203 Hypermetropia, bilateral: Secondary | ICD-10-CM | POA: Diagnosis not present

## 2019-09-27 NOTE — Progress Notes (Signed)
Subjective:    Patient ID: Sheryl [name], female    DOB: 1951/02/11, 69 y.o.   MRN: [id number]  HPI The patient is here for an acute visit.   She was itching a lot and started using baby oil. Her sister told her to stop taking hot baths.  It has helped.    Flare of vertigo:  Started in the fall.  It has been more in the past two weeks.  She has it 4-5 times a day.  It does not last long.  Her ear feel stopped up.  She hears something sloshing in her left ear.  Feels like fluid in ears.  She can not pop ear.   The dizziness is related to beding over and head movements.    Has taken meclzine and it helps.  She has not taken it consistently.     Medications and allergies reviewed with patient and updated if appropriate.  Patient Active Problem List   Diagnosis Date Noted  . Acute respiratory failure with hypoxia ([place]) 04/01/2019  . Community acquired pneumonia 04/01/2019  . Anemia 04/01/2019  . 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 Vertigo 06/05/2018  . 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 CKD (chronic kidney disease) 12/27/2016  . Insomnia 06/29/2016  . Prediabetes 06/29/2016  . 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 COPD (chronic obstructive pulmonary disease) ([place]) 12/09/2014  . Lumbago 07/16/2014  . Bilateral hand pain 07/16/2014  . HTN (hypertension) 06/04/2012  . Hyperlipidemia 06/04/2012  . Depression 06/04/2012  . ADD (attention deficit disorder) 06/04/2012  . AR (allergic rhinitis) 06/04/2012    Current Outpatient Medications on File Prior to Visit  Medication Sig Dispense Refill  . aspirin 81 MG chewable tablet Chew 162 mg by mouth daily.    . ferrous sulfate 325 (65 FE) MG tablet Take 1 tablet (325 mg total) by mouth 2 (two) times daily with a meal. 30 tablet 3  . FLUoxetine (PROZAC) 20 MG capsule Take 1 capsule (20 mg total) by mouth daily in addition to 40 mg for a total of 60 mg. 90 capsule 1  .  FLUoxetine (PROZAC) 40 MG capsule TAKE 1 CAPSULE (40 MG TOTAL) BY MOUTH DAILY. IN ADDITION TO 20 MG FOR TOTAL OF 60 MG DAILY 90 capsule 0  . fluticasone (FLONASE) 50 MCG/ACT nasal spray Place into both nostrils daily.    . hydrochlorothiazide (HYDRODIURIL) 12.5 MG tablet TAKE 1 TABLET BY MOUTH EVERY DAY 90 tablet 1  . losartan (COZAAR) 100 MG tablet TAKE 1 TABLET BY MOUTH EVERY DAY 90 tablet 1  . rosuvastatin (CRESTOR) 10 MG tablet TAKE 1 TABLET BY MOUTH EVERY DAY 90 tablet 1  . STRATTERA 100 MG capsule TAKE 1 CAPSULE BY MOUTH EVERY DAY 30 capsule 5  . traZODone (DESYREL) 50 MG tablet TAKE 1 TABLET BY MOUTH EVERYDAY AT BEDTIME (Patient taking differently: Take 50 mg by mouth at bedtime. ) 90 tablet 3  . TURMERIC PO Take 1,000 mg by mouth daily.     . vitamin B-12 100 MCG tablet Take 1 tablet (100 mcg total) by mouth daily. 30 tablet 0  . ZINC PICOLINATE PO Take 50 mg by mouth daily.     No current facility-administered medications on file prior to visit.    Past Medical History:  Diagnosis Date  . ADD (attention deficit disorder)   . Allergy   . Arthritis   . CKD (chronic kidney disease), stage III   .  COPD (chronic obstructive pulmonary disease) ([place]) 12/09/2014  . Depression   . Does use hearing aid   . Dyspnea    on exertion  . Hyperlipidemia   . Hypertension   . LGSIL (low grade squamous intraepithelial dysplasia)   . Osteoporosis   . Pre-diabetes     Past Surgical History:  Procedure Laterality Date  . JOINT REPLACEMENT     right knee  . MULTIPLE TOOTH EXTRACTIONS    . REPLACEMENT TOTAL KNEE  2013   right  . REVERSE SHOULDER ARTHROPLASTY Right 03/30/2019   Procedure: REVERSE TOTAL SHOULDER ARTHROPLASTY;  Surgeon: Netta Cedars, MD;  Location: [facility];  Service: Orthopedics;  Laterality: Right;  interscalene block  . TOTAL KNEE ARTHROPLASTY Left 12/27/2017  . TOTAL KNEE ARTHROPLASTY Left 12/27/2017   Procedure: LEFT TOTAL KNEE ARTHROPLASTY;  Surgeon: Netta Cedars, MD;   Location: [facility];  Service: Orthopedics;  Laterality: Left;    Social History   Socioeconomic History  . Marital status: Divorced    Spouse name: Not on file  . Number of children: 2  . Years of education: Not on file  . Highest education level: Not on file  Occupational History  . Not on file  Tobacco Use  . Smoking status: Current Every Day Smoker    Packs/day: 0.25    Years: 49.00    Pack years: 12.25    Types: Cigarettes    Last attempt to quit: 06/02/2018    Years since quitting: 1.3  . Smokeless tobacco: Never Used  . Tobacco comment: Trying quit smoking!, resumed    Substance and Sexual Activity  . Alcohol use: No    Alcohol/week: 0.0 standard drinks  . Drug use: No  . Sexual activity: Not Currently  Other Topics Concern  . Not on file  Social History Narrative  . Not on file   Social Determinants of Health   Financial Resource Strain:   . Difficulty of Paying Living Expenses: Not on file  Food Insecurity:   . Worried About Charity fundraiser in the Last Year: Not on file  . Ran Out of Food in the Last Year: Not on file  Transportation Needs:   . Lack of Transportation (Medical): Not on file  . Lack of Transportation (Non-Medical): Not on file  Physical Activity:   . Days of Exercise per Week: Not on file  . Minutes of Exercise per Session: Not on file  Stress:   . Feeling of Stress : Not on file  Social Connections:   . Frequency of Communication with Friends and Family: Not on file  . Frequency of Social Gatherings with Friends and Family: Not on file  . Attends Religious Services: Not on file  . Active Member of Clubs or Organizations: Not on file  . Attends Archivist Meetings: Not on file  . Marital Status: Not on file    Family History  Problem Relation Age of Onset  . Hyperlipidemia Mother   . Alcohol abuse Father   . Arthritis Father   . Colon polyps Father   . Alcohol abuse Maternal Uncle   . Stroke Maternal Grandmother      Review of Systems  Constitutional: Negative for chills and fever.  HENT: Positive for postnasal drip (a little) and sinus pressure (a little). Negative for congestion, ear pain (ear pressure) and sinus pain.   Respiratory: Positive for cough (clear ) and shortness of breath. Negative for wheezing.   Cardiovascular: Negative for chest pain.  Neurological: Positive for dizziness and headaches. Negative for weakness and numbness.       Objective:   Vitals:   09/28/19 1455  BP: (!) 144/84  Pulse: 70  Resp: 16  Temp: 98 F (36.7 C)  SpO2: 99%   BP Readings from Last 3 Encounters:  09/28/19 (!) 144/84  04/10/19 132/80  04/04/19 131/68   Wt Readings from Last 3 Encounters:  09/28/19 143 lb 9.6 oz (65.1 kg)  04/10/19 146 lb (66.2 kg)  04/01/19 143 lb 4.8 oz (65 kg)   Body mass index is 27.13 kg/m.   Physical Exam    GENERAL APPEARANCE: Appears stated age, well appearing, NAD EYES: conjunctiva clear, no icterus, mild horizontal nystagmus HEENT: bilateral tympanic membranes and ear canals normal, oropharynx with no erythema, no thyromegaly, trachea midline, no cervical or supraclavicular lymphadenopathy LUNGS: Clear to auscultation without wheeze or crackles, unlabored breathing, good air entry bilaterally CARDIOVASCULAR: Normal S1,S2 without murmurs, no edema Neuro: CN ii-Xi intact, normal sensation and strength all extrem SKIN: Warm, dry      Assessment & Plan:    See Problem List for Assessment and Plan of chronic medical problems.     This visit occurred during the SARS-CoV-2 public health emergency.  Safety protocols were in place, including screening questions prior to the visit, additional usage of staff PPE, and extensive cleaning of exam room while observing appropriate contact time as indicated for disinfecting solutions.

## 2019-09-27 NOTE — Patient Instructions (Addendum)
  Blood work was ordered.     Medications reviewed and updated.  Changes include :   Meclizine as needed.  Take the steroid taper as prescribed.  Use the flonase daily.   Your prescription(s) have been submitted to your pharmacy. Please take as directed and contact our office if you believe you are having problem(s) with the medication(s).  Please call if there is no improvement in your symptoms.

## 2019-09-28 ENCOUNTER — Ambulatory Visit: Payer: Medicare PPO | Admitting: Internal Medicine

## 2019-09-28 ENCOUNTER — Encounter: Payer: Self-pay | Admitting: Internal Medicine

## 2019-09-28 ENCOUNTER — Other Ambulatory Visit: Payer: Self-pay

## 2019-09-28 VITALS — BP 144/84 | HR 70 | Temp 98.0°F | Resp 16 | Ht 61.0 in | Wt 143.6 lb

## 2019-09-28 DIAGNOSIS — R42 Dizziness and giddiness: Secondary | ICD-10-CM

## 2019-09-28 DIAGNOSIS — Z23 Encounter for immunization: Secondary | ICD-10-CM

## 2019-09-28 DIAGNOSIS — D649 Anemia, unspecified: Secondary | ICD-10-CM

## 2019-09-28 DIAGNOSIS — J449 Chronic obstructive pulmonary disease, unspecified: Secondary | ICD-10-CM

## 2019-09-28 LAB — COMPREHENSIVE METABOLIC PANEL
ALT: 19 U/L (ref 0–35)
AST: 20 U/L (ref 0–37)
Albumin: 3.9 g/dL (ref 3.5–5.2)
Alkaline Phosphatase: 112 U/L (ref 39–117)
BUN: 18 mg/dL (ref 6–23)
CO2: 27 mEq/L (ref 19–32)
Calcium: 8.8 mg/dL (ref 8.4–10.5)
Chloride: 104 mEq/L (ref 96–112)
Creatinine, Ser: 1.23 mg/dL — ABNORMAL HIGH (ref 0.40–1.20)
GFR: 43.27 mL/min — ABNORMAL LOW (ref >60.00)
Glucose, Bld: 90 mg/dL (ref 70–99)
Potassium: 3.8 mEq/L (ref 3.5–5.1)
Sodium: 139 mEq/L (ref 135–145)
Total Bilirubin: 0.4 mg/dL (ref 0.2–1.2)
Total Protein: 7.6 g/dL (ref 6.0–8.3)

## 2019-09-28 LAB — CBC WITH DIFFERENTIAL/PLATELET
Basophils Absolute: 0.1 10*3/uL (ref 0.0–0.1)
Basophils Relative: 1.3 % (ref 0.0–3.0)
Eosinophils Absolute: 0.8 10*3/uL — ABNORMAL HIGH (ref 0.0–0.7)
Eosinophils Relative: 12.9 % — ABNORMAL HIGH (ref 0.0–5.0)
HCT: 36.8 % (ref 36.0–46.0)
Hemoglobin: 12.3 g/dL (ref 12.0–15.0)
Lymphocytes Relative: 33 % (ref 12.0–46.0)
Lymphs Abs: 2 10*3/uL (ref 0.7–4.0)
MCHC: 33.5 g/dL (ref 30.0–36.0)
MCV: 96.6 fl (ref 78.0–100.0)
Monocytes Absolute: 0.4 10*3/uL (ref 0.1–1.0)
Monocytes Relative: 7.3 % (ref 3.0–12.0)
Neutro Abs: 2.8 10*3/uL (ref 1.4–7.7)
Neutrophils Relative %: 45.5 % (ref 43.0–77.0)
Platelets: 334 10*3/uL (ref 150.0–400.0)
RBC: 3.81 Mil/uL — ABNORMAL LOW (ref 3.87–5.11)
RDW: 12.7 % (ref 11.5–15.5)
WBC: 6.1 10*3/uL (ref 4.0–10.5)

## 2019-09-28 MED ORDER — ALBUTEROL SULFATE HFA 108 (90 BASE) MCG/ACT IN AERS: 1.0000 | INHALATION_SPRAY | RESPIRATORY_TRACT | 1 refills | Status: DC | PRN

## 2019-09-28 MED ORDER — MECLIZINE HCL 25 MG PO TABS: 25.0000 mg | ORAL_TABLET | Freq: Three times a day (TID) | ORAL | 0 refills | Status: DC | PRN

## 2019-09-28 MED ORDER — ANORO ELLIPTA 62.5-25 MCG/INH IN AEPB: INHALATION_SPRAY | RESPIRATORY_TRACT | 5 refills | Status: DC

## 2019-09-28 MED ORDER — METHYLPREDNISOLONE 4 MG PO TBPK: ORAL_TABLET | ORAL | 0 refills | Status: DC

## 2019-09-28 NOTE — Assessment & Plan Note (Signed)
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

## 2019-09-28 NOTE — Assessment & Plan Note (Signed)
H/o anemia after shoulder surgery ? Contributing to vertigo Cbc, iron panel

## 2019-09-29 ENCOUNTER — Encounter: Payer: Self-pay | Admitting: Internal Medicine

## 2019-09-29 LAB — IRON,TIBC AND FERRITIN PANEL
%SAT: 33 % (calc) (ref 16–45)
Ferritin: 146 ng/mL (ref 16–288)
Iron: 88 ug/dL (ref 45–160)
TIBC: 270 mcg/dL (calc) (ref 250–450)

## 2019-10-06 ENCOUNTER — Telehealth: Payer: Self-pay

## 2019-10-06 NOTE — Telephone Encounter (Signed)
Refill not appropriate - is she still dizzy? Did the steroid help?

## 2019-10-06 NOTE — Telephone Encounter (Signed)
Tried calling pt back. No answer and VM was full.

## 2019-10-06 NOTE — Telephone Encounter (Signed)
New message   Pt c/o medication issue:  1. Name of Medication: methylPREDNISolone (MEDROL DOSEPAK) 4 MG TBPK tablet  2. How are you currently taking this medication (dosage and times per day)?   3. Are you having a reaction (difficulty breathing--STAT)? no  4. What is your medication issue? Dizzy and nausea

## 2019-10-06 NOTE — Telephone Encounter (Signed)
Was seen on 09/28/19

## 2019-10-07 NOTE — Telephone Encounter (Signed)
Tried calling pt back. No answer and VM was full.

## 2019-10-11 NOTE — Progress Notes (Signed)
Subjective:    Patient ID: Sheryl [name], female    DOB: [birth year], 69 y.o.   MRN: [id number]  HPI The patient is here for follow up of their chronic medical problems, including hypertension, prediabetes, CKD, hyperlipidemia, ADD, depression, insomnia, COPD   Vertigo:  She was recently for vertigo.  She took the medrol dose pak - it made the dizziness worse.  He has tinnitus.  It is not as frequent.  It is intermittent and often occurs with quick movements.      Medications and allergies reviewed with patient and updated if appropriate.  Patient Active Problem List   Diagnosis Date Noted  . Acute respiratory failure with hypoxia ([place]) 04/01/2019  . Community acquired pneumonia 04/01/2019  . Anemia 04/01/2019  . 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 Vertigo 06/05/2018  . 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 CKD (chronic kidney disease) 12/27/2016  . Insomnia 06/29/2016  . Prediabetes 06/29/2016  . 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 COPD (chronic obstructive pulmonary disease) ([place]) 12/09/2014  . Lumbago 07/16/2014  . Bilateral hand pain 07/16/2014  . HTN (hypertension) 06/04/2012  . Hyperlipidemia 06/04/2012  . Depression 06/04/2012  . ADD (attention deficit disorder) 06/04/2012  . AR (allergic rhinitis) 06/04/2012    Current Outpatient Medications on File Prior to Visit  Medication Sig Dispense Refill  . albuterol (VENTOLIN HFA) 108 (90 Base) MCG/ACT inhaler Inhale 1-2 puffs into the lungs every 4 (four) hours as needed for wheezing or shortness of breath. 16 g 1  . aspirin 81 MG chewable tablet Chew 162 mg by mouth daily.    . ferrous sulfate 325 (65 FE) MG tablet Take 1 tablet (325 mg total) by mouth 2 (two) times daily with a meal. 30 tablet 3  . FLUoxetine (PROZAC) 20 MG capsule Take 1 capsule (20 mg total) by mouth daily in addition to 40 mg for a total of 60 mg. 90 capsule 1  . FLUoxetine (PROZAC) 40 MG  capsule TAKE 1 CAPSULE (40 MG TOTAL) BY MOUTH DAILY. IN ADDITION TO 20 MG FOR TOTAL OF 60 MG DAILY 90 capsule 0  . fluticasone (FLONASE) 50 MCG/ACT nasal spray Place into both nostrils daily.    . hydrochlorothiazide (HYDRODIURIL) 12.5 MG tablet TAKE 1 TABLET BY MOUTH EVERY DAY 90 tablet 1  . losartan (COZAAR) 100 MG tablet TAKE 1 TABLET BY MOUTH EVERY DAY 90 tablet 1  . meclizine (ANTIVERT) 25 MG tablet Take 1 tablet (25 mg total) by mouth 3 (three) times daily as needed for dizziness. 30 tablet 0  . rosuvastatin (CRESTOR) 10 MG tablet TAKE 1 TABLET BY MOUTH EVERY DAY 90 tablet 1  . STRATTERA 100 MG capsule TAKE 1 CAPSULE BY MOUTH EVERY DAY 30 capsule 5  . traZODone (DESYREL) 50 MG tablet TAKE 1 TABLET BY MOUTH EVERYDAY AT BEDTIME (Patient taking differently: Take 50 mg by mouth at bedtime. ) 90 tablet 3  . TURMERIC PO Take 1,000 mg by mouth daily.     Marland Kitchen umeclidinium-vilanterol (ANORO ELLIPTA) 62.5-25 MCG/INH AEPB TAKE 1 PUFF BY MOUTH EVERY DAY 60 each 5  . vitamin B-12 100 MCG tablet Take 1 tablet (100 mcg total) by mouth daily. 30 tablet 0  . ZINC PICOLINATE PO Take 50 mg by mouth daily.     No current facility-administered medications on file prior to visit.    Past Medical History:  Diagnosis Date  . ADD (attention deficit disorder)   .  Allergy   . Arthritis   . CKD (chronic kidney disease), stage III   . COPD (chronic obstructive pulmonary disease) ([place]) 12/09/2014  . Depression   . Does use hearing aid   . Dyspnea    on exertion  . Hyperlipidemia   . Hypertension   . LGSIL (low grade squamous intraepithelial dysplasia)   . Osteoporosis   . Pre-diabetes     Past Surgical History:  Procedure Laterality Date  . JOINT REPLACEMENT     right knee  . MULTIPLE TOOTH EXTRACTIONS    . REPLACEMENT TOTAL KNEE  2013   right  . REVERSE SHOULDER ARTHROPLASTY Right 03/30/2019   Procedure: REVERSE TOTAL SHOULDER ARTHROPLASTY;  Surgeon: Netta Cedars, MD;  Location: [facility];  Service:  Orthopedics;  Laterality: Right;  interscalene block  . TOTAL KNEE ARTHROPLASTY Left 12/27/2017  . TOTAL KNEE ARTHROPLASTY Left 12/27/2017   Procedure: LEFT TOTAL KNEE ARTHROPLASTY;  Surgeon: Netta Cedars, MD;  Location: [facility];  Service: Orthopedics;  Laterality: Left;    Social History   Socioeconomic History  . Marital status: Divorced    Spouse name: Not on file  . Number of children: 2  . Years of education: Not on file  . Highest education level: Not on file  Occupational History  . Not on file  Tobacco Use  . Smoking status: Current Every Day Smoker    Packs/day: 0.25    Years: 49.00    Pack years: 12.25    Types: Cigarettes    Last attempt to quit: 06/02/2018    Years since quitting: 1.3  . Smokeless tobacco: Never Used  . Tobacco comment: Trying quit smoking!, resumed    Substance and Sexual Activity  . Alcohol use: No    Alcohol/week: 0.0 standard drinks  . Drug use: No  . Sexual activity: Not Currently  Other Topics Concern  . Not on file  Social History Narrative  . Not on file   Social Determinants of Health   Financial Resource Strain:   . Difficulty of Paying Living Expenses: Not on file  Food Insecurity:   . Worried About Charity fundraiser in the Last Year: Not on file  . Ran Out of Food in the Last Year: Not on file  Transportation Needs:   . Lack of Transportation (Medical): Not on file  . Lack of Transportation (Non-Medical): Not on file  Physical Activity:   . Days of Exercise per Week: Not on file  . Minutes of Exercise per Session: Not on file  Stress:   . Feeling of Stress : Not on file  Social Connections:   . Frequency of Communication with Friends and Family: Not on file  . Frequency of Social Gatherings with Friends and Family: Not on file  . Attends Religious Services: Not on file  . Active Member of Clubs or Organizations: Not on file  . Attends Archivist Meetings: Not on file  . Marital Status: Not on file    Family  History  Problem Relation Age of Onset  . Hyperlipidemia Mother   . Alcohol abuse Father   . Arthritis Father   . Colon polyps Father   . Alcohol abuse Maternal Uncle   . Stroke Maternal Grandmother     Review of Systems  Constitutional: Negative for chills and fever.  Respiratory: Negative for cough, shortness of breath and wheezing.   Cardiovascular: Negative for chest pain, palpitations and leg swelling.  Neurological: Positive for dizziness. Negative for  weakness, numbness and headaches.       Objective:   Vitals:   10/12/19 1358  BP: (!) 148/84  Resp: 16  Temp: 98.4 F (36.9 C)   BP Readings from Last 3 Encounters:  10/12/19 (!) 148/84  09/28/19 (!) 144/84  04/10/19 132/80   Wt Readings from Last 3 Encounters:  10/12/19 141 lb (64 kg)  09/28/19 143 lb 9.6 oz (65.1 kg)  04/10/19 146 lb (66.2 kg)   Body mass index is 26.64 kg/m.   Physical Exam    Constitutional: Appears well-developed and well-nourished. No distress.  HENT:  Head: Normocephalic and atraumatic.  Neck: Neck supple. No tracheal deviation present. No thyromegaly present.  No cervical lymphadenopathy Cardiovascular: Normal rate, regular rhythm and normal heart sounds.   No murmur heard. No carotid bruit .  No edema Pulmonary/Chest: Effort normal and breath sounds normal. No respiratory distress. No has no wheezes. No rales.  Skin: Skin is warm and dry. Not diaphoretic.  Psychiatric: Normal mood and affect. Behavior is normal.    Lab Results  Component Value Date   WBC 6.1 09/28/2019   HGB 12.3 09/28/2019   HCT 36.8 09/28/2019   PLT 334.0 09/28/2019   GLUCOSE 90 09/28/2019   CHOL 181 06/18/2018   TRIG 112.0 06/18/2018   HDL 54.10 06/18/2018   LDLDIRECT 100.0 12/27/2016   LDLCALC 105 (H) 06/18/2018   ALT 19 09/28/2019   AST 20 09/28/2019   NA 139 09/28/2019   K 3.8 09/28/2019   CL 104 09/28/2019   CREATININE 1.23 (H) 09/28/2019   BUN 18 09/28/2019   CO2 27 09/28/2019   TSH 0.96  03/22/2016   INR 2.03 (H) 02/27/2011   HGBA1C 5.7 (H) 03/26/2019     Assessment & Plan:    See Problem List for Assessment and Plan of chronic medical problems.    This visit occurred during the SARS-CoV-2 public health emergency.  Safety protocols were in place, including screening questions prior to the visit, additional usage of staff PPE, and extensive cleaning of exam room while observing appropriate contact time as indicated for disinfecting solutions.

## 2019-10-11 NOTE — Patient Instructions (Addendum)
[facility]  [address]  [place], [place] [address]  Main: [place] [address] [place], [place] [address] [phone]     Medications reviewed and updated.  Changes include :   Start amlodipine 5 mg daily for your BP.  Your prescription(s) have been submitted to your pharmacy. Please take as directed and contact our office if you believe you are having problem(s) with the medication(s).  A referral was ordered for PT for your dizziness.    Please followup in 6 months

## 2019-10-12 ENCOUNTER — Other Ambulatory Visit: Payer: Self-pay

## 2019-10-12 ENCOUNTER — Ambulatory Visit (INDEPENDENT_AMBULATORY_CARE_PROVIDER_SITE_OTHER): Payer: Medicare PPO | Admitting: Internal Medicine

## 2019-10-12 ENCOUNTER — Encounter: Payer: Self-pay | Admitting: Internal Medicine

## 2019-10-12 VITALS — BP 148/84 | Temp 98.4°F | Resp 16 | Ht 61.0 in | Wt 141.0 lb

## 2019-10-12 DIAGNOSIS — I10 Essential (primary) hypertension: Secondary | ICD-10-CM

## 2019-10-12 DIAGNOSIS — E782 Mixed hyperlipidemia: Secondary | ICD-10-CM | POA: Diagnosis not present

## 2019-10-12 DIAGNOSIS — J449 Chronic obstructive pulmonary disease, unspecified: Secondary | ICD-10-CM

## 2019-10-12 DIAGNOSIS — G4700 Insomnia, unspecified: Secondary | ICD-10-CM

## 2019-10-12 DIAGNOSIS — N1832 Chronic kidney disease, stage 3b: Secondary | ICD-10-CM | POA: Diagnosis not present

## 2019-10-12 DIAGNOSIS — F329 Major depressive disorder, single episode, unspecified: Secondary | ICD-10-CM | POA: Diagnosis not present

## 2019-10-12 DIAGNOSIS — F988 Other specified behavioral and emotional disorders with onset usually occurring in childhood and adolescence: Secondary | ICD-10-CM

## 2019-10-12 DIAGNOSIS — R42 Dizziness and giddiness: Secondary | ICD-10-CM | POA: Diagnosis not present

## 2019-10-12 DIAGNOSIS — R7303 Prediabetes: Secondary | ICD-10-CM | POA: Diagnosis not present

## 2019-10-12 DIAGNOSIS — F32A Depression, unspecified: Secondary | ICD-10-CM

## 2019-10-12 MED ORDER — AMLODIPINE BESYLATE 5 MG PO TABS: 5.0000 mg | ORAL_TABLET | Freq: Every day | ORAL | 1 refills | Status: DC

## 2019-10-12 NOTE — Assessment & Plan Note (Addendum)
Chronic Avoids nsaids Stressed increasing fluids Start amlodipine for better BP control

## 2019-10-12 NOTE — Assessment & Plan Note (Signed)
Chronic Controlled, stable Continue current dose of medication - strattera 100 mg daily

## 2019-10-12 NOTE — Assessment & Plan Note (Signed)
Better, but still having vertigo Related to quick movements Medrol dose pak made her symptoms worse Referred to PT

## 2019-10-12 NOTE — Assessment & Plan Note (Signed)
Chronic Controlled, stable Continue current dose of medication- trazodone 50 mg daily

## 2019-10-12 NOTE — Assessment & Plan Note (Signed)
Chronic Controlled, stable Continue current dose of medication - prozac 60 mg daily

## 2019-10-12 NOTE — Assessment & Plan Note (Signed)
Chronic Using anoro daily Denies wheeze, cough, sob controlled

## 2019-10-12 NOTE — Assessment & Plan Note (Signed)
Lab Results  Component Value Date   HGBA1C 5.7 (H) 03/26/2019   Low sugar/carb diet Regular exercise stress Advised weight loss

## 2019-10-12 NOTE — Assessment & Plan Note (Signed)
Chronic BP not controlled Add amlodipine 5 mg daily Continue other medications

## 2019-10-22 ENCOUNTER — Ambulatory Visit: Payer: Medicare PPO | Attending: Internal Medicine

## 2019-10-22 DIAGNOSIS — Z23 Encounter for immunization: Secondary | ICD-10-CM

## 2019-10-22 NOTE — Progress Notes (Signed)
   Covid-19 Vaccination Clinic  Name:  Sheryl [name]    MRN: [id number] DOB: 04-05-1951  10/22/2019  [name] was observed post Covid-19 immunization for 15 minutes without incident. She was provided with Vaccine Information Sheet and instruction to access the V-Safe system.   [name] was instructed to call 911 with any severe reactions post vaccine: Marland Kitchen Difficulty breathing  . Swelling of face and throat  . A fast heartbeat  . A bad rash all over body  . Dizziness and weakness   Immunizations Administered    Name Date Dose VIS Date Route   Pfizer COVID-19 Vaccine 10/22/2019 10:06 AM 0.3 mL 07/31/2019 Intramuscular   Manufacturer: [place]   Lot: [id number]   [place]: [id number]

## 2019-11-03 ENCOUNTER — Other Ambulatory Visit: Payer: Self-pay | Admitting: Internal Medicine

## 2019-11-18 ENCOUNTER — Ambulatory Visit: Payer: Medicare PPO | Attending: Internal Medicine

## 2019-11-18 DIAGNOSIS — Z23 Encounter for immunization: Secondary | ICD-10-CM

## 2019-11-18 NOTE — Progress Notes (Signed)
   Covid-19 Vaccination Clinic  Name:  KAYORI HOLZMANN    MRN: [id number] DOB: 1951-05-10  11/18/2019  Ms. Hemker was observed post Covid-19 immunization for 15 minutes without incident. She was provided with Vaccine Information Sheet and instruction to access the V-Safe system.   Ms. Acha was instructed to call 911 with any severe reactions post vaccine: Marland Kitchen Difficulty breathing  . Swelling of face and throat  . A fast heartbeat  . A bad rash all over body  . Dizziness and weakness   Immunizations Administered    Name Date Dose VIS Date Route   Pfizer COVID-19 Vaccine 11/18/2019 10:02 AM 0.3 mL 07/31/2019 Intramuscular   Manufacturer: [employer]   Lot: [id number]   [place]: [id number]

## 2019-11-24 ENCOUNTER — Telehealth: Payer: Self-pay

## 2019-11-24 NOTE — Telephone Encounter (Signed)
New message   The patient calling   Medication [place] - Respimat solution for inhalation is the replacement medication umeclidinium-vilanterol (ANORO ELLIPTA) 62.5-25 MCG/INH AEPB insurance will not approve current medication .

## 2019-11-25 MED ORDER — STIOLTO RESPIMAT 2.5-2.5 MCG/ACT IN AERS: 2.0000 | INHALATION_SPRAY | Freq: Every day | RESPIRATORY_TRACT | 11 refills | Status: DC

## 2019-11-25 NOTE — Telephone Encounter (Signed)
New inhaler sent to [facility]

## 2019-11-25 NOTE — Telephone Encounter (Signed)
I know there are different options. Can you send something else in with respimat in it?

## 2019-12-03 ENCOUNTER — Other Ambulatory Visit: Payer: Self-pay | Admitting: Internal Medicine

## 2019-12-03 ENCOUNTER — Telehealth: Payer: Self-pay

## 2019-12-03 NOTE — Telephone Encounter (Signed)
LVM to call back in regards to strattera PA

## 2019-12-08 ENCOUNTER — Encounter: Payer: Self-pay | Admitting: Internal Medicine

## 2019-12-08 NOTE — Telephone Encounter (Signed)
LVM to call back in regards. 

## 2019-12-08 NOTE — Telephone Encounter (Signed)
New message:  Pt is returning a call in regards to her inhaler that needed to be changed for insurance purposes. Please advise.

## 2019-12-16 DIAGNOSIS — H25011 Cortical age-related cataract, right eye: Secondary | ICD-10-CM | POA: Diagnosis not present

## 2019-12-16 DIAGNOSIS — H25012 Cortical age-related cataract, left eye: Secondary | ICD-10-CM | POA: Diagnosis not present

## 2019-12-16 DIAGNOSIS — H2512 Age-related nuclear cataract, left eye: Secondary | ICD-10-CM | POA: Diagnosis not present

## 2019-12-16 DIAGNOSIS — H2511 Age-related nuclear cataract, right eye: Secondary | ICD-10-CM | POA: Diagnosis not present

## 2019-12-22 ENCOUNTER — Other Ambulatory Visit: Payer: Self-pay

## 2019-12-22 DIAGNOSIS — F988 Other specified behavioral and emotional disorders with onset usually occurring in childhood and adolescence: Secondary | ICD-10-CM

## 2019-12-22 MED ORDER — ATOMOXETINE HCL 100 MG PO CAPS: ORAL_CAPSULE | ORAL | 5 refills | Status: DC

## 2019-12-22 NOTE — Telephone Encounter (Signed)
Can you send in the generic for straterra to [facility] on [address]. The brand name is not covered.

## 2020-01-13 DIAGNOSIS — H2511 Age-related nuclear cataract, right eye: Secondary | ICD-10-CM | POA: Diagnosis not present

## 2020-01-13 DIAGNOSIS — H25011 Cortical age-related cataract, right eye: Secondary | ICD-10-CM | POA: Diagnosis not present

## 2020-01-21 ENCOUNTER — Other Ambulatory Visit: Payer: Self-pay | Admitting: Internal Medicine

## 2020-01-21 DIAGNOSIS — F988 Other specified behavioral and emotional disorders with onset usually occurring in childhood and adolescence: Secondary | ICD-10-CM

## 2020-01-22 NOTE — Telephone Encounter (Signed)
Last OV 10/12/19 Next OV 04/12/20   Not showing up on database.. I thought it was controlled?

## 2020-03-24 ENCOUNTER — Other Ambulatory Visit: Payer: Self-pay | Admitting: Internal Medicine

## 2020-04-03 ENCOUNTER — Other Ambulatory Visit: Payer: Self-pay | Admitting: Internal Medicine

## 2020-04-04 ENCOUNTER — Other Ambulatory Visit: Payer: Self-pay | Admitting: Internal Medicine

## 2020-04-07 ENCOUNTER — Other Ambulatory Visit: Payer: Self-pay | Admitting: Internal Medicine

## 2020-04-11 NOTE — Patient Instructions (Addendum)
Blood work was ordered.    All other Health Maintenance issues reviewed.   All recommended immunizations and age-appropriate screenings are up-to-date or discussed.  No immunization administered today.    Medications reviewed and updated.  Changes include :   Start omeprazole 20 mg, use the daily inhaler every day.   Your prescription(s) have been submitted to your pharmacy. Please take as directed and contact our office if you believe you are having problem(s) with the medication(s).   Please followup in 4 weeks   [facility] [place] [phone]  [facility] [place] [place], [place], [place], [address] [phone]   [facility]            [place] Maintenance, Female Adopting a healthy lifestyle and getting preventive care are important in promoting health and wellness. Ask your health care provider about:  The right schedule for you to have regular tests and exams.  Things you can do on your own to prevent diseases and keep yourself healthy. What should I know about diet, weight, and exercise? Eat a healthy diet   Eat a diet that includes plenty of vegetables, fruits, low-fat dairy products, and lean protein.  Do not eat a lot of foods that are high in solid fats, added sugars, or sodium. Maintain a healthy weight Body mass index (BMI) is used to identify weight problems. It estimates body fat based on height and weight. Your health care provider can help determine your BMI and help you achieve or maintain a healthy weight. Get regular exercise Get regular exercise. This is one of the most important things you can do for your health. Most adults  should:  Exercise for at least 150 minutes each week. The exercise should increase your heart rate and make you sweat (moderate-intensity exercise).  Do strengthening exercises at least twice a week. This is in addition to the moderate-intensity exercise.  Spend less time sitting. Even light physical activity can be beneficial. Watch cholesterol and blood lipids Have your blood tested for lipids and cholesterol at 70 years of age, then have this test every 5 years. Have your cholesterol levels checked more often if:  Your lipid or cholesterol levels are high.  You are older than 70 years of age.  You are at high risk for heart disease. What should I know about cancer screening? Depending on your health history and family history, you may need to have cancer screening at various ages. This may include screening for:  Breast cancer.  Cervical cancer.  Colorectal cancer.  Skin cancer.  Lung cancer. What should I know about heart disease, diabetes, and high blood pressure? Blood pressure and heart disease  High blood pressure causes heart disease and increases the risk of stroke. This is more likely to develop in people who have high blood pressure readings, are of African descent, or are overweight.  Have your blood pressure checked: ? Every 3-5 years if you are 52-53 years of age. ? Every year if you are 55 years old or older. Diabetes Have regular diabetes screenings. This checks your fasting blood sugar level. Have the screening done:  Once every three years after age 57 if you are at a normal weight and have a low risk for  diabetes.  More often and at a younger age if you are overweight or have a high risk for diabetes. What should I know about preventing infection? Hepatitis B If you have a higher risk for hepatitis B, you should be screened for this virus. Talk with your health care provider to find out if you are at risk for hepatitis B infection. Hepatitis C Testing  is recommended for:  Everyone born from [birth year] through 1966.  Anyone with known risk factors for hepatitis C. Sexually transmitted infections (STIs)  Get screened for STIs, including gonorrhea and chlamydia, if: ? You are sexually active and are younger than 70 years of age. ? You are older than 70 years of age and your health care provider tells you that you are at risk for this type of infection. ? Your sexual activity has changed since you were last screened, and you are at increased risk for chlamydia or gonorrhea. Ask your health care provider if you are at risk.  Ask your health care provider about whether you are at high risk for HIV. Your health care provider may recommend a prescription medicine to help prevent HIV infection. If you choose to take medicine to prevent HIV, you should first get tested for HIV. You should then be tested every 3 months for as long as you are taking the medicine. Pregnancy  If you are about to stop having your period (premenopausal) and you may become pregnant, seek counseling before you get pregnant.  Take 400 to 800 micrograms (mcg) of folic acid every day if you become pregnant.  Ask for birth control (contraception) if you want to prevent pregnancy. Osteoporosis and menopause Osteoporosis is a disease in which the bones lose minerals and strength with aging. This can result in bone fractures. If you are 48 years old or older, or if you are at risk for osteoporosis and fractures, ask your health care provider if you should:  Be screened for bone loss.  Take a calcium or vitamin D supplement to lower your risk of fractures.  Be given hormone replacement therapy (HRT) to treat symptoms of menopause. Follow these instructions at home: Lifestyle  Do not use any products that contain nicotine or tobacco, such as cigarettes, e-cigarettes, and chewing tobacco. If you need help quitting, ask your health care provider.  Do not use street drugs.  Do not  share needles.  Ask your health care provider for help if you need support or information about quitting drugs. Alcohol use  Do not drink alcohol if: ? Your health care provider tells you not to drink. ? You are pregnant, may be pregnant, or are planning to become pregnant.  If you drink alcohol: ? Limit how much you use to 0-1 drink a day. ? Limit intake if you are breastfeeding.  Be aware of how much alcohol is in your drink. In the [place] one drink equals one 12 oz bottle of beer (355 mL), one 5 oz glass of wine (148 mL), or one 1 oz glass of hard liquor (44 mL). General instructions  Schedule regular health, dental, and eye exams.  Stay current with your vaccines.  Tell your health care provider if: ? You often feel depressed. ? You have ever been abused or do not feel safe at home. Summary  Adopting a healthy lifestyle and getting preventive care are important in promoting health and wellness.  Follow your health care provider's instructions about healthy diet, exercising, and getting tested or screened for diseases.  Follow your health care provider's instructions on monitoring your cholesterol and blood pressure. This information is not intended to replace advice given to you by your health care provider. Make sure you discuss any questions you have with your health care provider. Document Revised: 07/30/2018 Document Reviewed: 07/30/2018 Elsevier Patient Education  2020 [employer].

## 2020-04-11 NOTE — Progress Notes (Signed)
Subjective:    Patient ID: Sheryl [name], female    DOB: [birth year], 69 y.o.   MRN: [id number]  HPI She is here for a physical exam.   Her son last year went bipolar and manic.  She has had increased depression and stress.  Him and his girlfriend and sons came to live with her - she had to call the police - they were trashing the house - she thinks they were on drugs. She feels depressed.  She denies anxiety.   She quit smoking about three months ago.  She feels SOB with exertion.  She has not taken stiolto daily  She was trying to use the albuterol daily,but it made her jittery.   Food gets stuck with swallowing. She has gerd daily.    Medications and allergies reviewed with patient and updated if appropriate.  Patient Active Problem List   Diagnosis Date Noted  . Acute respiratory failure with hypoxia ([place]) 04/01/2019  . Community acquired pneumonia 04/01/2019  . Anemia 04/01/2019  . 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 Vertigo 06/05/2018  . 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 CKD (chronic kidney disease) 12/27/2016  . Insomnia 06/29/2016  . Prediabetes 06/29/2016  . 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 COPD (chronic obstructive pulmonary disease) ([place]) 12/09/2014  . Lumbago 07/16/2014  . Bilateral hand pain 07/16/2014  . HTN (hypertension) 06/04/2012  . Hyperlipidemia 06/04/2012  . Depression 06/04/2012  . ADD (attention deficit disorder) 06/04/2012  . AR (allergic rhinitis) 06/04/2012    Current Outpatient Medications on File Prior to Visit  Medication Sig Dispense Refill  . albuterol (VENTOLIN HFA) 108 (90 Base) MCG/ACT inhaler Inhale 1-2 puffs into the lungs every 4 (four) hours as needed for wheezing or shortness of breath. 16 g 1  . amLODipine (NORVASC) 5 MG tablet Take 1 tablet (5 mg total) by mouth daily. 90 tablet 1  . aspirin 81 MG chewable tablet Chew 162 mg by mouth daily.    Marland Kitchen atomoxetine  (STRATTERA) 100 MG capsule TAKE 1 CAPSULE BY MOUTH EVERY DAY 90 capsule 2  . FLUoxetine (PROZAC) 20 MG capsule TAKE 1 CAPSULE (20 MG TOTAL) BY MOUTH DAILY IN ADDITION TO 40 MG FOR A TOTAL OF 60 MG. 90 capsule 1  . FLUoxetine (PROZAC) 40 MG capsule TAKE 1 CAPSULE (40 MG TOTAL) BY MOUTH DAILY. IN ADDITION TO 20 MG FOR TOTAL OF 60 MG DAILY 90 capsule 0  . fluticasone (FLONASE) 50 MCG/ACT nasal spray Place into both nostrils daily.    . hydrochlorothiazide (HYDRODIURIL) 12.5 MG tablet TAKE 1 TABLET BY MOUTH EVERY DAY 90 tablet 1  . losartan (COZAAR) 100 MG tablet TAKE 1 TABLET BY MOUTH EVERY DAY 90 tablet 1  . meclizine (ANTIVERT) 25 MG tablet Take 1 tablet (25 mg total) by mouth 3 (three) times daily as needed for dizziness. 30 tablet 0  . rosuvastatin (CRESTOR) 10 MG tablet TAKE 1 TABLET BY MOUTH EVERY DAY 90 tablet 1  . Tiotropium Bromide-Olodaterol (STIOLTO RESPIMAT) 2.5-2.5 MCG/ACT AERS Inhale 2 puffs into the lungs daily. 4 g 11  . traZODone (DESYREL) 50 MG tablet TAKE 1 TABLET BY MOUTH EVERYDAY AT BEDTIME 90 tablet 1  . TURMERIC PO Take 1,000 mg by mouth daily.     . vitamin B-12 100 MCG tablet Take 1 tablet (100 mcg total) by mouth daily. 30 tablet 0  . ZINC PICOLINATE PO Take 50 mg by mouth daily.    Marland Kitchen  ferrous sulfate 325 (65 FE) MG tablet Take 1 tablet (325 mg total) by mouth 2 (two) times daily with a meal. 30 tablet 3   No current facility-administered medications on file prior to visit.    Past Medical History:  Diagnosis Date  . ADD (attention deficit disorder)   . Allergy   . Arthritis   . CKD (chronic kidney disease), stage III   . COPD (chronic obstructive pulmonary disease) ([place]) 12/09/2014  . Depression   . Does use hearing aid   . Dyspnea    on exertion  . Hyperlipidemia   . Hypertension   . LGSIL (low grade squamous intraepithelial dysplasia)   . Osteoporosis   . Pre-diabetes     Past Surgical History:  Procedure Laterality Date  . JOINT REPLACEMENT     right  knee  . MULTIPLE TOOTH EXTRACTIONS    . REPLACEMENT TOTAL KNEE  2013   right  . REVERSE SHOULDER ARTHROPLASTY Right 03/30/2019   Procedure: REVERSE TOTAL SHOULDER ARTHROPLASTY;  Surgeon: Netta Cedars, MD;  Location: [facility];  Service: Orthopedics;  Laterality: Right;  interscalene block  . TOTAL KNEE ARTHROPLASTY Left 12/27/2017  . TOTAL KNEE ARTHROPLASTY Left 12/27/2017   Procedure: LEFT TOTAL KNEE ARTHROPLASTY;  Surgeon: Netta Cedars, MD;  Location: [facility];  Service: Orthopedics;  Laterality: Left;    Social History   Socioeconomic History  . Marital status: Divorced    Spouse name: Not on file  . Number of children: 2  . Years of education: Not on file  . Highest education level: Not on file  Occupational History  . Not on file  Tobacco Use  . Smoking status: Current Every Day Smoker    Packs/day: 0.25    Years: 49.00    Pack years: 12.25    Types: Cigarettes    Last attempt to quit: 06/02/2018    Years since quitting: 1.8  . Smokeless tobacco: Never Used  . Tobacco comment: Trying quit smoking!, resumed    Vaping Use  . Vaping Use: Former  Substance and Sexual Activity  . Alcohol use: No    Alcohol/week: 0.0 standard drinks  . Drug use: No  . Sexual activity: Not Currently  Other Topics Concern  . Not on file  Social History Narrative  . Not on file   Social Determinants of Health   Financial Resource Strain:   . Difficulty of Paying Living Expenses: Not on file  Food Insecurity:   . Worried About Charity fundraiser in the Last Year: Not on file  . Ran Out of Food in the Last Year: Not on file  Transportation Needs:   . Lack of Transportation (Medical): Not on file  . Lack of Transportation (Non-Medical): Not on file  Physical Activity:   . Days of Exercise per Week: Not on file  . Minutes of Exercise per Session: Not on file  Stress:   . Feeling of Stress : Not on file  Social Connections:   . Frequency of Communication with Friends and Family: Not on  file  . Frequency of Social Gatherings with Friends and Family: Not on file  . Attends Religious Services: Not on file  . Active Member of Clubs or Organizations: Not on file  . Attends Archivist Meetings: Not on file  . Marital Status: Not on file    Family History  Problem Relation Age of Onset  . Hyperlipidemia Mother   . Alcohol abuse Father   . Arthritis  Father   . Colon polyps Father   . Alcohol abuse Maternal Uncle   . Stroke Maternal Grandmother     Review of Systems  Constitutional: Negative for chills and fever.  HENT: Positive for trouble swallowing.   Eyes: Negative for visual disturbance.  Respiratory: Positive for cough, shortness of breath and wheezing.   Cardiovascular: Negative for chest pain, palpitations and leg swelling.  Gastrointestinal: Positive for diarrhea (taking metamucil pill - controlled). Negative for abdominal pain, blood in stool, constipation and nausea.       Gerd  Genitourinary: Negative for dysuria.  Musculoskeletal: Positive for arthralgias (everywhere).  Skin: Negative for rash.  Neurological: Positive for dizziness (occ). Negative for headaches.  Psychiatric/Behavioral: Positive for dysphoric mood. Negative for suicidal ideas. The patient is not nervous/anxious.        Objective:   Vitals:   04/12/20 1337  BP: 134/86  Pulse: (!) 101  Temp: 98.2 F (36.8 C)  SpO2: 95%   Filed Weights   04/12/20 1337  Weight: 143 lb (64.9 kg)   Body mass index is 27.02 kg/m.  BP Readings from Last 3 Encounters:  04/12/20 134/86  10/12/19 (!) 148/84  09/28/19 (!) 144/84    Wt Readings from Last 3 Encounters:  04/12/20 143 lb (64.9 kg)  10/12/19 141 lb (64 kg)  09/28/19 143 lb 9.6 oz (65.1 kg)     Physical Exam Constitutional: She appears well-developed and well-nourished. No distress.  HENT:  Head: Normocephalic and atraumatic.  Right Ear: External ear normal. Normal ear canal and TM Left Ear: External ear normal.   Normal ear canal and TM Mouth/Throat: Oropharynx is clear and moist.  Eyes: Conjunctivae and EOM are normal.  Neck: Neck supple. No tracheal deviation present. No thyromegaly present.  No carotid bruit  Cardiovascular: Normal rate, regular rhythm and normal heart sounds.   No murmur heard.  No edema. Pulmonary/Chest: Effort normal and breath sounds normal. No respiratory distress. She has no wheezes. She has no rales.  Breast: deferred   Abdominal: Soft. She exhibits no distension. There is no tenderness.  Lymphadenopathy: She has no cervical adenopathy.  Skin: Skin is warm and dry. She is not diaphoretic.  Psychiatric: She has a normal mood and affect. Her behavior is normal.        Assessment & Plan:   Physical exam: Screening blood work    ordered Immunizations  rec flu, others up to date Colonoscopy  Up to date  Mammogram  Due - will schedule Gyn  Due - will order Dexa  Due - ordered Eye exams  Up to date  Exercise  none Weight  Weigh to for age Substance abuse  Quit smoking, no etoh  See Problem List for Assessment and Plan of chronic medical problems.   This visit occurred during the SARS-CoV-2 public health emergency.  Safety protocols were in place, including screening questions prior to the visit, additional usage of staff PPE, and extensive cleaning of exam room while observing appropriate contact time as indicated for disinfecting solutions.

## 2020-04-12 ENCOUNTER — Ambulatory Visit (INDEPENDENT_AMBULATORY_CARE_PROVIDER_SITE_OTHER): Payer: Medicare PPO | Admitting: Internal Medicine

## 2020-04-12 ENCOUNTER — Encounter: Payer: Self-pay | Admitting: Internal Medicine

## 2020-04-12 ENCOUNTER — Other Ambulatory Visit: Payer: Self-pay

## 2020-04-12 VITALS — BP 134/86 | HR 101 | Temp 98.2°F | Ht 61.0 in | Wt 143.0 lb

## 2020-04-12 DIAGNOSIS — F988 Other specified behavioral and emotional disorders with onset usually occurring in childhood and adolescence: Secondary | ICD-10-CM

## 2020-04-12 DIAGNOSIS — J449 Chronic obstructive pulmonary disease, unspecified: Secondary | ICD-10-CM

## 2020-04-12 DIAGNOSIS — Z1231 Encounter for screening mammogram for malignant neoplasm of breast: Secondary | ICD-10-CM

## 2020-04-12 DIAGNOSIS — R7303 Prediabetes: Secondary | ICD-10-CM | POA: Diagnosis not present

## 2020-04-12 DIAGNOSIS — G4700 Insomnia, unspecified: Secondary | ICD-10-CM

## 2020-04-12 DIAGNOSIS — Z0000 Encounter for general adult medical examination without abnormal findings: Secondary | ICD-10-CM

## 2020-04-12 DIAGNOSIS — E782 Mixed hyperlipidemia: Secondary | ICD-10-CM

## 2020-04-12 DIAGNOSIS — N1832 Chronic kidney disease, stage 3b: Secondary | ICD-10-CM | POA: Diagnosis not present

## 2020-04-12 DIAGNOSIS — F329 Major depressive disorder, single episode, unspecified: Secondary | ICD-10-CM

## 2020-04-12 DIAGNOSIS — F32A Depression, unspecified: Secondary | ICD-10-CM

## 2020-04-12 DIAGNOSIS — I10 Essential (primary) hypertension: Secondary | ICD-10-CM

## 2020-04-12 DIAGNOSIS — K219 Gastro-esophageal reflux disease without esophagitis: Secondary | ICD-10-CM

## 2020-04-12 DIAGNOSIS — Z13820 Encounter for screening for osteoporosis: Secondary | ICD-10-CM

## 2020-04-12 MED ORDER — OMEPRAZOLE 20 MG PO CPDR: 20.0000 mg | DELAYED_RELEASE_CAPSULE | Freq: Every day | ORAL | 1 refills | Status: DC

## 2020-04-12 NOTE — Assessment & Plan Note (Signed)
Chronic Controlled, stable Continue current dose of medication  

## 2020-04-12 NOTE — Assessment & Plan Note (Signed)
Chronic BP well controlled Current regimen effective and well tolerated Continue current medications at current doses cmp  

## 2020-04-12 NOTE — Assessment & Plan Note (Addendum)
Chronic, DOE Not controlled Taking albuterol - causes jitteriness.  Not taking stiolto  Advised taking stiolto daily If no improvement she will let me know Stressed regular exercise

## 2020-04-12 NOTE — Assessment & Plan Note (Signed)
Chronic Not currently controlled Does not want to change medication at this time Names and numbers of therapists given

## 2020-04-12 NOTE — Assessment & Plan Note (Signed)
Chronic cmp 

## 2020-04-12 NOTE — Assessment & Plan Note (Signed)
Chronic Check lipid panel  Continue daily statin Regular exercise and healthy diet encouraged  

## 2020-04-12 NOTE — Assessment & Plan Note (Signed)
New  Having GERD daily and some dysphagia Start omeprazole 20 mg daily F/u in 4 weeks - if no improvement will refer to GI

## 2020-04-12 NOTE — Assessment & Plan Note (Signed)
Chronic Check a1c Low sugar / carb diet Stressed regular exercise  

## 2020-04-13 LAB — CBC WITH DIFFERENTIAL/PLATELET
Absolute Monocytes: 730 cells/uL (ref 200–950)
Basophils Absolute: 88 cells/uL (ref 0–200)
Basophils Relative: 1 %
Eosinophils Absolute: 959 cells/uL — ABNORMAL HIGH (ref 15–500)
Eosinophils Relative: 10.9 %
HCT: 37.7 % (ref 35.0–45.0)
Hemoglobin: 12.5 g/dL (ref 11.7–15.5)
Lymphs Abs: 2420 cells/uL (ref 850–3900)
MCH: 30.9 pg (ref 27.0–33.0)
MCHC: 33.2 g/dL (ref 32.0–36.0)
MCV: 93.3 fL (ref 80.0–100.0)
MPV: 10.5 fL (ref 7.5–12.5)
Monocytes Relative: 8.3 %
Neutro Abs: 4602 cells/uL (ref 1500–7800)
Neutrophils Relative %: 52.3 %
Platelets: 403 10*3/uL — ABNORMAL HIGH (ref 140–400)
RBC: 4.04 10*6/uL (ref 3.80–5.10)
RDW: 12.1 % (ref 11.0–15.0)
Total Lymphocyte: 27.5 %
WBC: 8.8 10*3/uL (ref 3.8–10.8)

## 2020-04-13 LAB — COMPREHENSIVE METABOLIC PANEL
AG Ratio: 1 (calc) (ref 1.0–2.5)
ALT: 22 U/L (ref 6–29)
AST: 27 U/L (ref 10–35)
Albumin: 4.1 g/dL (ref 3.6–5.1)
Alkaline phosphatase (APISO): 140 U/L (ref 37–153)
BUN/Creatinine Ratio: 15 (calc) (ref 6–22)
BUN: 20 mg/dL (ref 7–25)
CO2: 27 mmol/L (ref 20–32)
Calcium: 9.7 mg/dL (ref 8.6–10.4)
Chloride: 101 mmol/L (ref 98–110)
Creat: 1.32 mg/dL — ABNORMAL HIGH (ref 0.50–0.99)
Globulin: 4.1 g/dL (calc) — ABNORMAL HIGH (ref 1.9–3.7)
Glucose, Bld: 82 mg/dL (ref 65–99)
Potassium: 4.8 mmol/L (ref 3.5–5.3)
Sodium: 139 mmol/L (ref 135–146)
Total Bilirubin: 0.5 mg/dL (ref 0.2–1.2)
Total Protein: 8.2 g/dL — ABNORMAL HIGH (ref 6.1–8.1)

## 2020-04-13 LAB — LIPID PANEL
Cholesterol: 190 mg/dL (ref <200)
HDL: 53 mg/dL (ref >=50)
LDL Cholesterol (Calc): 109 mg/dL (calc) — ABNORMAL HIGH
Non-HDL Cholesterol (Calc): 137 mg/dL (calc) — ABNORMAL HIGH (ref <130)
Total CHOL/HDL Ratio: 3.6 (calc) (ref <5.0)
Triglycerides: 165 mg/dL — ABNORMAL HIGH (ref <150)

## 2020-04-13 LAB — TSH: TSH: 2.18 mIU/L (ref 0.40–4.50)

## 2020-04-13 LAB — HEMOGLOBIN A1C
Hgb A1c MFr Bld: 5.5 % of total Hgb (ref <5.7)
Mean Plasma Glucose: 111 (calc)
eAG (mmol/L): 6.2 (calc)

## 2020-04-26 ENCOUNTER — Other Ambulatory Visit: Payer: Self-pay | Admitting: Internal Medicine

## 2020-05-09 DIAGNOSIS — I700 Atherosclerosis of aorta: Secondary | ICD-10-CM | POA: Insufficient documentation

## 2020-05-09 NOTE — Patient Instructions (Addendum)
Get your shingrix shot at the pharmacy.   Get your covid booster in November.   Blood work was ordered.     Flu immunization administered today.     Medications reviewed and updated.  Changes include :   Start chantix.  Try the new inhaler and use albuterol as needed only.    Your prescription(s) have been submitted to your pharmacy. Please take as directed and contact our office if you believe you are having problem(s) with the medication(s).   Please followup in 6 months

## 2020-05-09 NOTE — Progress Notes (Signed)
Subjective:    Patient ID: Sheryl [name], female    DOB: Aug 18, 1951, 69 y.o.   MRN: [id number]  HPI The patient is here for follow up of her DOE and GERD.   One month ago - had DOE.  Had quit smoking.  Was not taking stiolto.  She was using albuterol daily ( caused jitteriness).  Advised to take stiolto daily.  She has been using the stiolto but is not sure she is getting the medication.  She started smoking again 3 weeks ago.  She does state shortness of breath with exertion.   Food was getting stuck with swallowing.  She had gerd daily.  She started omeprazole 20 mg daily.  She was taking fiber pills and thinks that was causing the problem.  She stopped the fiber pills and her symptoms improved greatly.  She stopped the omeprazole.  She has occasional GERD and will take the omeprazole as needed.   Depression -she did talk to her son and he is doing a little bit better.  She is not sure if he is still using drugs or not.  She feels her depression is better.  Medications and allergies reviewed with patient and updated if appropriate.  Patient Active Problem List   Diagnosis Date Noted  . Aortic atherosclerosis ([place]) 05/09/2020  . GERD (gastroesophageal reflux disease) 04/12/2020  . Acute respiratory failure with hypoxia ([place]) 04/01/2019  . Community acquired pneumonia 04/01/2019  . Anemia 04/01/2019  . 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 Vertigo 06/05/2018  . 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 CKD (chronic kidney disease) 12/27/2016  . Insomnia 06/29/2016  . Prediabetes 06/29/2016  . 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 COPD (chronic obstructive pulmonary disease) ([place]) 12/09/2014  . Lumbago 07/16/2014  . Bilateral hand pain 07/16/2014  . HTN (hypertension) 06/04/2012  . Hyperlipidemia 06/04/2012  . Depression 06/04/2012  . ADD (attention deficit disorder) 06/04/2012  . AR (allergic rhinitis) 06/04/2012     Current Outpatient Medications on File Prior to Visit  Medication Sig Dispense Refill  . albuterol (VENTOLIN HFA) 108 (90 Base) MCG/ACT inhaler Inhale 1-2 puffs into the lungs every 4 (four) hours as needed for wheezing or shortness of breath. 16 g 1  . amLODipine (NORVASC) 5 MG tablet TAKE 1 TABLET BY MOUTH EVERY DAY 90 tablet 1  . aspirin 81 MG chewable tablet Chew 162 mg by mouth daily.    Marland Kitchen atomoxetine (STRATTERA) 100 MG capsule TAKE 1 CAPSULE BY MOUTH EVERY DAY 90 capsule 2  . FLUoxetine (PROZAC) 20 MG capsule TAKE 1 CAPSULE (20 MG TOTAL) BY MOUTH DAILY IN ADDITION TO 40 MG FOR A TOTAL OF 60 MG. 90 capsule 1  . FLUoxetine (PROZAC) 40 MG capsule TAKE 1 CAPSULE (40 MG TOTAL) BY MOUTH DAILY. IN ADDITION TO 20 MG FOR TOTAL OF 60 MG DAILY 90 capsule 0  . fluticasone (FLONASE) 50 MCG/ACT nasal spray Place into both nostrils daily.    . hydrochlorothiazide (HYDRODIURIL) 12.5 MG tablet TAKE 1 TABLET BY MOUTH EVERY DAY 90 tablet 1  . losartan (COZAAR) 100 MG tablet TAKE 1 TABLET BY MOUTH EVERY DAY 90 tablet 1  . meclizine (ANTIVERT) 25 MG tablet Take 1 tablet (25 mg total) by mouth 3 (three) times daily as needed for dizziness. 30 tablet 0  . omeprazole (PRILOSEC) 20 MG capsule Take 1 capsule (20 mg total) by mouth daily. 90 capsule 1  . polyethylene glycol powder (GLYCOLAX/MIRALAX) 17  GM/SCOOP powder polyethylene glycol 3350 17 gram oral powder packet  TAKE 17 G BY MOUTH 2 (TWO) TIMES DAILY.    . rosuvastatin (CRESTOR) 10 MG tablet TAKE 1 TABLET BY MOUTH EVERY DAY 90 tablet 1  . Tiotropium Bromide-Olodaterol (STIOLTO RESPIMAT) 2.5-2.5 MCG/ACT AERS Inhale 2 puffs into the lungs daily. 4 g 11  . traZODone (DESYREL) 50 MG tablet TAKE 1 TABLET BY MOUTH EVERYDAY AT BEDTIME 90 tablet 1  . TURMERIC PO Take 1,000 mg by mouth daily.     . varenicline (CHANTIX STARTING MONTH PAK) 0.5 MG X 11 & 1 MG X 42 tablet Chantix Starting Month Box 0.5 mg (11)-1 mg (42) tablets in dose pack  TAKE AS DIRECTED ON  PACKAGE    . vitamin B-12 100 MCG tablet Take 1 tablet (100 mcg total) by mouth daily. 30 tablet 0  . ZINC PICOLINATE PO Take 50 mg by mouth daily.     No current facility-administered medications on file prior to visit.    Past Medical History:  Diagnosis Date  . ADD (attention deficit disorder)   . Allergy   . Arthritis   . CKD (chronic kidney disease), stage III   . COPD (chronic obstructive pulmonary disease) ([place]) 12/09/2014  . Depression   . Does use hearing aid   . Dyspnea    on exertion  . Hyperlipidemia   . Hypertension   . LGSIL (low grade squamous intraepithelial dysplasia)   . Osteoporosis   . Pre-diabetes     Past Surgical History:  Procedure Laterality Date  . JOINT REPLACEMENT     right knee  . MULTIPLE TOOTH EXTRACTIONS    . REPLACEMENT TOTAL KNEE  2013   right  . REVERSE SHOULDER ARTHROPLASTY Right 03/30/2019   Procedure: REVERSE TOTAL SHOULDER ARTHROPLASTY;  Surgeon: Netta Cedars, MD;  Location: [facility];  Service: Orthopedics;  Laterality: Right;  interscalene block  . TOTAL KNEE ARTHROPLASTY Left 12/27/2017  . TOTAL KNEE ARTHROPLASTY Left 12/27/2017   Procedure: LEFT TOTAL KNEE ARTHROPLASTY;  Surgeon: Netta Cedars, MD;  Location: [facility];  Service: Orthopedics;  Laterality: Left;    Social History   Socioeconomic History  . Marital status: Divorced    Spouse name: Not on file  . Number of children: 2  . Years of education: Not on file  . Highest education level: Not on file  Occupational History  . Not on file  Tobacco Use  . Smoking status: Current Every Day Smoker    Packs/day: 0.25    Years: 49.00    Pack years: 12.25    Types: Cigarettes  . Smokeless tobacco: Never Used  . Tobacco comment: Trying quit smoking!, resumed    Vaping Use  . Vaping Use: Former  Substance and Sexual Activity  . Alcohol use: No    Alcohol/week: 0.0 standard drinks  . Drug use: No  . Sexual activity: Not Currently  Other Topics Concern  . Not on file  Social  History Narrative  . Not on file   Social Determinants of Health   Financial Resource Strain: Low Risk   . Difficulty of Paying Living Expenses: Not hard at all  Food Insecurity: No Food Insecurity  . Worried About Charity fundraiser in the Last Year: Never true  . Ran Out of Food in the Last Year: Never true  Transportation Needs: No Transportation Needs  . Lack of Transportation (Medical): No  . Lack of Transportation (Non-Medical): No  Physical Activity: Sufficiently Active  .  Days of Exercise per Week: 7 days  . Minutes of Exercise per Session: 30 min  Stress: No Stress Concern Present  . Feeling of Stress : Not at all  Social Connections: Moderately Integrated  . Frequency of Communication with Friends and Family: More than three times a week  . Frequency of Social Gatherings with Friends and Family: More than three times a week  . Attends Religious Services: More than 4 times per year  . Active Member of Clubs or Organizations: Yes  . Attends Archivist Meetings: More than 4 times per year  . Marital Status: Never married    Family History  Problem Relation Age of Onset  . Hyperlipidemia Mother   . Alcohol abuse Father   . Arthritis Father   . Colon polyps Father   . Alcohol abuse Maternal Uncle   . Stroke Maternal Grandmother     Review of Systems  Constitutional: Negative for chills and fever.  Respiratory: Positive for cough (minimal sputum), shortness of breath (with exertion) and wheezing.   Cardiovascular: Negative for chest pain.  Neurological: Negative for light-headedness and headaches.       Objective:   Vitals:   05/10/20 1439  BP: 120/80  Pulse: 96  Temp: 98.1 F (36.7 C)  SpO2: 98%   BP Readings from Last 3 Encounters:  05/10/20 120/80  05/10/20 120/80  04/12/20 134/86   Wt Readings from Last 3 Encounters:  05/10/20 142 lb 6.4 oz (64.6 kg)  05/10/20 142 lb 6.4 oz (64.6 kg)  04/12/20 143 lb (64.9 kg)   Body mass index is  26.91 kg/m.   Physical Exam    Constitutional: Appears well-developed and well-nourished. No distress.  HENT:  Head: Normocephalic and atraumatic.  Neck: Neck supple. No tracheal deviation present. No thyromegaly present.  No cervical lymphadenopathy Cardiovascular: Normal rate, regular rhythm and normal heart sounds.   No murmur heard. No carotid bruit .  No edema Pulmonary/Chest: Effort normal and breath sounds normal. No respiratory distress. No has no wheezes. No rales.  Skin: Skin is warm and dry. Not diaphoretic.  Psychiatric: Normal mood and affect. Behavior is normal.      Assessment & Plan:    See Problem List for Assessment and Plan of chronic medical problems.    This visit occurred during the SARS-CoV-2 public health emergency.  Safety protocols were in place, including screening questions prior to the visit, additional usage of staff PPE, and extensive cleaning of exam room while observing appropriate contact time as indicated for disinfecting solutions.

## 2020-05-10 ENCOUNTER — Encounter: Payer: Self-pay | Admitting: Internal Medicine

## 2020-05-10 ENCOUNTER — Other Ambulatory Visit: Payer: Self-pay

## 2020-05-10 ENCOUNTER — Ambulatory Visit: Payer: Medicare PPO | Admitting: Internal Medicine

## 2020-05-10 ENCOUNTER — Ambulatory Visit (INDEPENDENT_AMBULATORY_CARE_PROVIDER_SITE_OTHER): Payer: Medicare PPO

## 2020-05-10 VITALS — BP 120/80 | HR 96 | Temp 98.1°F | Resp 16 | Ht 61.0 in | Wt 142.4 lb

## 2020-05-10 VITALS — BP 120/80 | HR 96 | Temp 98.1°F | Ht 61.0 in | Wt 142.4 lb

## 2020-05-10 DIAGNOSIS — Z0000 Encounter for general adult medical examination without abnormal findings: Secondary | ICD-10-CM | POA: Diagnosis not present

## 2020-05-10 DIAGNOSIS — J449 Chronic obstructive pulmonary disease, unspecified: Secondary | ICD-10-CM | POA: Diagnosis not present

## 2020-05-10 DIAGNOSIS — K219 Gastro-esophageal reflux disease without esophagitis: Secondary | ICD-10-CM

## 2020-05-10 DIAGNOSIS — I700 Atherosclerosis of aorta: Secondary | ICD-10-CM | POA: Diagnosis not present

## 2020-05-10 DIAGNOSIS — Z23 Encounter for immunization: Secondary | ICD-10-CM

## 2020-05-10 DIAGNOSIS — F3289 Other specified depressive episodes: Secondary | ICD-10-CM

## 2020-05-10 MED ORDER — ALBUTEROL SULFATE HFA 108 (90 BASE) MCG/ACT IN AERS: 1.0000 | INHALATION_SPRAY | RESPIRATORY_TRACT | 8 refills | Status: DC | PRN

## 2020-05-10 MED ORDER — CHANTIX STARTING MONTH PAK 0.5 MG X 11 & 1 MG X 42 PO TABS
ORAL_TABLET | ORAL | 0 refills | Status: DC
Start: 2020-05-10 — End: 2020-12-14

## 2020-05-10 MED ORDER — BREZTRI AEROSPHERE 160-9-4.8 MCG/ACT IN AERO: 2.0000 | INHALATION_SPRAY | Freq: Two times a day (BID) | RESPIRATORY_TRACT | 5 refills | Status: DC

## 2020-05-10 NOTE — Progress Notes (Signed)
Subjective:   Sheryl [name] is a 69 y.o. female who presents for Medicare Annual (Subsequent) preventive examination.  Review of Systems    No ROS. Medicare Wellness Visit. Additional risk factors are reflected in social history. Cardiac Risk Factors include: advanced age (>23men, >18 women);dyslipidemia;family history of premature cardiovascular disease;hypertension;smoking/ tobacco exposure Sleep Patterns: Issues with insomnia.    Home Safety/Smoke Alarms: Feels safe in home; uses home alarm. Smoke alarms in place. Living environment: 2-story home; Lives alone with her dog; no needs for DME; good support system. Seat Belt Safety/Bike Helmet: Wears seat belt.    Objective:    Today's Vitals   05/10/20 1401 05/10/20 1402  BP: 120/80   Pulse: 96   Resp: 16   Temp: 98.1 F (36.7 C)   SpO2: 98%   Weight: 142 lb 6.4 oz (64.6 kg)   Height: 5\' 1"  (1.549 m)   PainSc:  6    Body mass index is 26.91 kg/m.  Advanced Directives 05/10/2020 04/01/2019 03/31/2019 03/30/2019 03/30/2019 03/26/2019 10/19/2018  Does Patient Have a Medical Advance Directive? Yes No Yes Yes Yes Yes No  Type of Advance Directive Living will Living will Living will Living will Living will Living will -  Does patient want to make changes to medical advance directive? No - Patient declined No - Patient declined No - Patient declined No - Patient declined No - Patient declined No - Patient declined -  Would patient like information on creating a medical advance directive? - No - Patient declined No - Patient declined - - - -    Current Medications (verified) Outpatient Encounter Medications as of 05/10/2020  Medication Sig  . albuterol (VENTOLIN HFA) 108 (90 Base) MCG/ACT inhaler Inhale 1-2 puffs into the lungs every 4 (four) hours as needed for wheezing or shortness of breath.  Marland Kitchen amLODipine (NORVASC) 5 MG tablet TAKE 1 TABLET BY MOUTH EVERY DAY  . aspirin 81 MG chewable tablet Chew 162 mg by mouth daily.  Marland Kitchen  atomoxetine (STRATTERA) 100 MG capsule TAKE 1 CAPSULE BY MOUTH EVERY DAY  . ferrous sulfate 325 (65 FE) MG tablet Take 1 tablet (325 mg total) by mouth 2 (two) times daily with a meal.  . FLUoxetine (PROZAC) 20 MG capsule TAKE 1 CAPSULE (20 MG TOTAL) BY MOUTH DAILY IN ADDITION TO 40 MG FOR A TOTAL OF 60 MG.  . FLUoxetine (PROZAC) 40 MG capsule TAKE 1 CAPSULE (40 MG TOTAL) BY MOUTH DAILY. IN ADDITION TO 20 MG FOR TOTAL OF 60 MG DAILY  . fluticasone (FLONASE) 50 MCG/ACT nasal spray Place into both nostrils daily.  . hydrochlorothiazide (HYDRODIURIL) 12.5 MG tablet TAKE 1 TABLET BY MOUTH EVERY DAY  . losartan (COZAAR) 100 MG tablet TAKE 1 TABLET BY MOUTH EVERY DAY  . meclizine (ANTIVERT) 25 MG tablet Take 1 tablet (25 mg total) by mouth 3 (three) times daily as needed for dizziness.  Marland Kitchen omeprazole (PRILOSEC) 20 MG capsule Take 1 capsule (20 mg total) by mouth daily.  . rosuvastatin (CRESTOR) 10 MG tablet TAKE 1 TABLET BY MOUTH EVERY DAY  . Tiotropium Bromide-Olodaterol (STIOLTO RESPIMAT) 2.5-2.5 MCG/ACT AERS Inhale 2 puffs into the lungs daily.  . traZODone (DESYREL) 50 MG tablet TAKE 1 TABLET BY MOUTH EVERYDAY AT BEDTIME  . TURMERIC PO Take 1,000 mg by mouth daily.   . vitamin B-12 100 MCG tablet Take 1 tablet (100 mcg total) by mouth daily.  Marland Kitchen ZINC PICOLINATE PO Take 50 mg by mouth daily.  No facility-administered encounter medications on file as of 05/10/2020.    Allergies (verified) Macrobid [nitrofurantoin monohyd macro]   History: Past Medical History:  Diagnosis Date  . ADD (attention deficit disorder)   . Allergy   . Arthritis   . CKD (chronic kidney disease), stage III   . COPD (chronic obstructive pulmonary disease) ([place]) 12/09/2014  . Depression   . Does use hearing aid   . Dyspnea    on exertion  . Hyperlipidemia   . Hypertension   . LGSIL (low grade squamous intraepithelial dysplasia)   . Osteoporosis   . Pre-diabetes    Past Surgical History:  Procedure Laterality  Date  . JOINT REPLACEMENT     right knee  . MULTIPLE TOOTH EXTRACTIONS    . REPLACEMENT TOTAL KNEE  2013   right  . REVERSE SHOULDER ARTHROPLASTY Right 03/30/2019   Procedure: REVERSE TOTAL SHOULDER ARTHROPLASTY;  Surgeon: Netta Cedars, MD;  Location: [facility];  Service: Orthopedics;  Laterality: Right;  interscalene block  . TOTAL KNEE ARTHROPLASTY Left 12/27/2017  . TOTAL KNEE ARTHROPLASTY Left 12/27/2017   Procedure: LEFT TOTAL KNEE ARTHROPLASTY;  Surgeon: Netta Cedars, MD;  Location: [facility];  Service: Orthopedics;  Laterality: Left;   Family History  Problem Relation Age of Onset  . Hyperlipidemia Mother   . Alcohol abuse Father   . Arthritis Father   . Colon polyps Father   . Alcohol abuse Maternal Uncle   . Stroke Maternal Grandmother    Social History   Socioeconomic History  . Marital status: Divorced    Spouse name: Not on file  . Number of children: 2  . Years of education: Not on file  . Highest education level: Not on file  Occupational History  . Not on file  Tobacco Use  . Smoking status: Current Every Day Smoker    Packs/day: 0.25    Years: 49.00    Pack years: 12.25    Types: Cigarettes  . Smokeless tobacco: Never Used  . Tobacco comment: Trying quit smoking!, resumed    Vaping Use  . Vaping Use: Former  Substance and Sexual Activity  . Alcohol use: No    Alcohol/week: 0.0 standard drinks  . Drug use: No  . Sexual activity: Not Currently  Other Topics Concern  . Not on file  Social History Narrative  . Not on file   Social Determinants of Health   Financial Resource Strain: Low Risk   . Difficulty of Paying Living Expenses: Not hard at all  Food Insecurity: No Food Insecurity  . Worried About Charity fundraiser in the Last Year: Never true  . Ran Out of Food in the Last Year: Never true  Transportation Needs: No Transportation Needs  . Lack of Transportation (Medical): No  . Lack of Transportation (Non-Medical): No  Physical Activity:  Sufficiently Active  . Days of Exercise per Week: 7 days  . Minutes of Exercise per Session: 30 min  Stress: No Stress Concern Present  . Feeling of Stress : Not at all  Social Connections: Moderately Integrated  . Frequency of Communication with Friends and Family: More than three times a week  . Frequency of Social Gatherings with Friends and Family: More than three times a week  . Attends Religious Services: More than 4 times per year  . Active Member of Clubs or Organizations: Yes  . Attends Archivist Meetings: More than 4 times per year  . Marital Status: Never married  Tobacco Counseling Ready to quit: Yes Counseling given: Not Answered Comment: Trying quit smoking!, resumed     Clinical Intake:  Pre-visit preparation completed: Yes  Pain : 0-10 Pain Score: 6  Pain Type: Chronic pain Pain Location: Hip Pain Orientation: Left Pain Radiating Towards: no Pain Descriptors / Indicators: Discomfort, Aching Pain Onset: More than a month ago Pain Frequency: Constant Pain Relieving Factors: no pain meds Effect of Pain on Daily Activities: Pain produces disability and affects the way in which the participants are able to perform their daily activities.  Pain Relieving Factors: no pain meds  BMI - recorded: 26.91 Nutritional Status: BMI 25 -29 Overweight Nutritional Risks: None Diabetes: No  How often do you need to have someone help you when you read instructions, pamphlets, or other written materials from your doctor or pharmacy?: 1 - Never What is the last grade level you completed in school?: Master's Degree  Diabetic? no  Interpreter Needed?: No  Information entered by :: Reeve Turnley N. Deliyah Muckle, LPN   Activities of Daily Living In your present state of health, do you have any difficulty performing the following activities: 05/10/2020  Hearing? N  Vision? N  Difficulty concentrating or making decisions? N  Walking or climbing stairs? N  Dressing or  bathing? N  Doing errands, shopping? N  Preparing Food and eating ? N  Using the Toilet? N  In the past six months, have you accidently leaked urine? N  Do you have problems with loss of bowel control? N  Managing your Medications? N  Managing your Finances? N  Housekeeping or managing your Housekeeping? N  Some recent data might be hidden    Patient Care Team: Binnie Rail, MD as PCP - General (Internal Medicine)  Indicate any recent Medical Services you may have received from other than [facility] providers in the past year (date may be approximate).     Assessment:   This is a routine wellness examination for My[place].  Hearing/Vision screen No exam data present  Dietary issues and exercise activities discussed: Current Exercise Habits: Home exercise routine, Time (Minutes): 25, Frequency (Times/Week): 7, Weekly Exercise (Minutes/Week): 175, Intensity: Mild, Exercise limited by: cardiac condition(s);psychological condition(s)  Goals    . Patient Stated     Increase my physical activity by starting to use pilates machine I have and start to go to the gym.    . Patient Stated     To maintain my current health status by continuing to eat healthy, stay physically active and socially active. I would like to lose 10-15 pounds.      Depression Screen PHQ 2/9 Scores 05/10/2020 04/12/2020 09/29/2019 04/10/2019 07/03/2018 07/02/2017 06/19/2017  PHQ - 2 Score 0 5 4 2 1 2  0  PHQ- 9 Score - 17 12 9 5 3  -    Fall Risk Fall Risk  05/10/2020 04/12/2020 04/10/2019 07/03/2018 07/02/2017  Falls in the past year? 1 0 0 0 No  Number falls in past yr: 0 - 0 - -  Injury with Fall? 0 - - - -  Risk for fall due to : No Fall Risks;Other (Comment) - - - -  Risk for fall due to: Comment dog tripped her while walking on the beach; no injury - - - -  Follow up Falls evaluation completed;Education provided - - - -    Any stairs in or around the home? Yes  If so, are there any without handrails? No  Home  free of loose throw rugs  in walkways, pet beds, electrical cords, etc? Yes  Adequate lighting in your home to reduce risk of falls? Yes   ASSISTIVE DEVICES UTILIZED TO PREVENT FALLS:  Life alert? No  Use of a cane, walker or w/c? No  Grab bars in the bathroom? No  Shower chair or bench in shower? No  Elevated toilet seat or a handicapped toilet? No   TIMED UP AND GO:  Was the test performed? No .  Length of time to ambulate 10 feet: 0 sec.   Gait steady and fast without use of assistive device  Cognitive Function: MMSE - Mini Mental State Exam 07/02/2017  Orientation to time 5  Orientation to Place 5  Registration 3  Attention/ Calculation 4  Recall 2  Language- name 2 objects 2  Language- repeat 1  Language- follow 3 step command 3  Language- read & follow direction 1  Write a sentence 1  Copy design 1  Total score 28     6CIT Screen 05/10/2020  What Year? 0 points  What month? 0 points  What time? 0 points  Count back from 20 0 points  Months in reverse 0 points  Repeat phrase 0 points  Total Score 0    Immunizations Immunization History  Administered Date(s) Administered  . Fluad Quad(high Dose 65+) 09/28/2019  . Influenza Split 06/04/2012  . Influenza, High Dose Seasonal PF 06/29/2016, 06/19/2017, 05/06/2018  . Influenza, Seasonal, Injecte, Preservative Fre 07/19/2013  . Influenza,inj,Quad PF,6+ Mos 06/01/2015  . PFIZER SARS-COV-2 Vaccination 10/22/2019, 11/18/2019  . Pneumococcal Conjugate-13 07/19/2013  . Pneumococcal Polysaccharide-23 11/09/2015  . Td 09/22/2014  . Tdap 03/12/2019  . Zoster Recombinat (Shingrix) 03/12/2019    TDAP status: Up to date Flu Vaccine status: Up to date Pneumococcal vaccine status: Up to date Covid-19 vaccine status: Completed vaccines  Qualifies for Shingles Vaccine? Yes   Zostavax completed Yes   Shingrix Completed?: Yes  Screening Tests Health Maintenance  Topic Date Due  . DEXA SCAN  Never done  . MAMMOGRAM   02/19/2019  . INFLUENZA VACCINE  03/20/2020  . COLONOSCOPY  09/10/2022  . TETANUS/TDAP  03/11/2029  . COVID-19 Vaccine  Completed  . Hepatitis C Screening  Completed  . PNA vac Low Risk Adult  Completed    Health Maintenance  Health Maintenance Due  Topic Date Due  . DEXA SCAN  Never done  . MAMMOGRAM  02/19/2019  . INFLUENZA VACCINE  03/20/2020    Colorectal cancer screening: Completed 09/10/2017. Repeat every 5 years Mammogram status: Completed 05/11/2020. Repeat every year Bone density status  Lung Cancer Screening: (Low Dose CT Chest recommended if Age 17-80 years, 30 pack-year currently smoking OR have quit w/in 15years.) does qualify.   Lung Cancer Screening Referral: no  Additional Screening:  Hepatitis C Screening: does qualify; Completed: yes  Vision Screening: Recommended annual ophthalmology exams for early detection of glaucoma and other disorders of the eye. Is the patient up to date with their annual eye exam?  Yes  Who is the provider or what is the name of the office in which the patient attends annual eye exams? Rutherford Guys, MD If pt is not established with a provider, would they like to be referred to a provider to establish care? No .   Dental Screening: Recommended annual dental exams for proper oral hygiene  Community Resource Referral / Chronic Care Management: CRR required this visit?  No   CCM required this visit?  No      Plan:  I have personally reviewed and noted the following in the patient's chart:   . Medical and social history . Use of alcohol, tobacco or illicit drugs  . Current medications and supplements . Functional ability and status . Nutritional status . Physical activity . Advanced directives . List of other physicians . Hospitalizations, surgeries, and ER visits in previous 12 months . Vitals . Screenings to include cognitive, depression, and falls . Referrals and appointments  In addition, I have reviewed and  discussed with patient certain preventive protocols, quality metrics, and best practice recommendations. A written personalized care plan for preventive services as well as general preventive health recommendations were provided to patient.     Sheral Flow, LPN   [id number]   Nurse Notes: n/a

## 2020-05-10 NOTE — Patient Instructions (Signed)
Sheryl [name] , Thank you for taking time to come for your Medicare Wellness Visit. I appreciate your ongoing commitment to your health goals. Please review the following plan we discussed and let me know if I can assist you in the future.   Screening recommendations/referrals: Colonoscopy: 09/10/2017; due every 5 years Mammogram: 02/18/2017; due every 2 years Bone Density: never done Recommended yearly ophthalmology/optometry visit for glaucoma screening and checkup Recommended yearly dental visit for hygiene and checkup  Vaccinations: Influenza vaccine: 09/28/2019 Pneumococcal vaccine: up to date Tdap vaccine: 03/12/2019; due every 10 years Shingles vaccine: 03/12/2019   Covid-19: up to date [employer])  Advanced directives: Please bring a copy of your health care power of attorney and living will to the office at your convenience.  Conditions/risks identified: Yes; Reviewed health maintenance screenings with patient today and relevant education, vaccines, and/or referrals were provided. Please continue to do your personal lifestyle choices by: daily care of teeth and gums, regular physical activity (goal should be 5 days a week for 30 minutes), eat a healthy diet, avoid tobacco and drug use, limiting any alcohol intake, taking a low-dose aspirin (if not allergic or have been advised by your provider otherwise) and taking vitamins and minerals as recommended by your provider. Continue doing brain stimulating activities (puzzles, reading, adult coloring books, staying active) to keep memory sharp. Continue to eat heart healthy diet (full of fruits, vegetables, whole grains, lean protein, water--limit salt, fat, and sugar intake) and increase physical activity as tolerated.  Next appointment: Please schedule your next Medicare Wellness Visit with your Nurse Health Advisor in 1 year.  Preventive Care 70 Years and Older, Female Preventive care refers to lifestyle choices and visits with your health care  provider that can promote health and wellness. What does preventive care include?  A yearly physical exam. This is also called an annual well check.  Dental exams once or twice a year.  Routine eye exams. Ask your health care provider how often you should have your eyes checked.  Personal lifestyle choices, including:  Daily care of your teeth and gums.  Regular physical activity.  Eating a healthy diet.  Avoiding tobacco and drug use.  Limiting alcohol use.  Practicing safe sex.  Taking low-dose aspirin every day.  Taking vitamin and mineral supplements as recommended by your health care provider. What happens during an annual well check? The services and screenings done by your health care provider during your annual well check will depend on your age, overall health, lifestyle risk factors, and family history of disease. Counseling  Your health care provider may ask you questions about your:  Alcohol use.  Tobacco use.  Drug use.  Emotional well-being.  Home and relationship well-being.  Sexual activity.  Eating habits.  History of falls.  Memory and ability to understand (cognition).  Work and work Statistician.  Reproductive health. Screening  You may have the following tests or measurements:  Height, weight, and BMI.  Blood pressure.  Lipid and cholesterol levels. These may be checked every 5 years, or more frequently if you are over 50 years old.  Skin check.  Lung cancer screening. You may have this screening every year starting at age 24 if you have a 30-pack-year history of smoking and currently smoke or have quit within the past 15 years.  Fecal occult blood test (FOBT) of the stool. You may have this test every year starting at age 50.  Flexible sigmoidoscopy or colonoscopy. You may have a sigmoidoscopy every  5 years or a colonoscopy every 10 years starting at age 50.  Hepatitis C blood test.  Hepatitis B blood test.  Sexually  transmitted disease (STD) testing.  Diabetes screening. This is done by checking your blood sugar (glucose) after you have not eaten for a while (fasting). You may have this done every 1-3 years.  Bone density scan. This is done to screen for osteoporosis. You may have this done starting at age 38.  Mammogram. This may be done every 1-2 years. Talk to your health care provider about how often you should have regular mammograms. Talk with your health care provider about your test results, treatment options, and if necessary, the need for more tests. Vaccines  Your health care provider may recommend certain vaccines, such as:  Influenza vaccine. This is recommended every year.  Tetanus, diphtheria, and acellular pertussis (Tdap, Td) vaccine. You may need a Td booster every 10 years.  Zoster vaccine. You may need this after age 39.  Pneumococcal 13-valent conjugate (PCV13) vaccine. One dose is recommended after age 67.  Pneumococcal polysaccharide (PPSV23) vaccine. One dose is recommended after age 48. Talk to your health care provider about which screenings and vaccines you need and how often you need them. This information is not intended to replace advice given to you by your health care provider. Make sure you discuss any questions you have with your health care provider. Document Released: 09/02/2015 Document Revised: 04/25/2016 Document Reviewed: 06/07/2015 Elsevier Interactive Patient Education  2017 [place] Prevention in the Home Falls can cause injuries. They can happen to people of all ages. There are many things you can do to make your home safe and to help prevent falls. What can I do on the outside of my home?  Regularly fix the edges of walkways and driveways and fix any cracks.  Remove anything that might make you trip as you walk through a door, such as a raised step or threshold.  Trim any bushes or trees on the path to your home.  Use bright outdoor  lighting.  Clear any walking paths of anything that might make someone trip, such as rocks or tools.  Regularly check to see if handrails are loose or broken. Make sure that both sides of any steps have handrails.  Any raised decks and porches should have guardrails on the edges.  Have any leaves, snow, or ice cleared regularly.  Use sand or salt on walking paths during winter.  Clean up any spills in your garage right away. This includes oil or grease spills. What can I do in the bathroom?  Use night lights.  Install grab bars by the toilet and in the tub and shower. Do not use towel bars as grab bars.  Use non-skid mats or decals in the tub or shower.  If you need to sit down in the shower, use a plastic, non-slip stool.  Keep the floor dry. Clean up any water that spills on the floor as soon as it happens.  Remove soap buildup in the tub or shower regularly.  Attach bath mats securely with double-sided non-slip rug tape.  Do not have throw rugs and other things on the floor that can make you trip. What can I do in the bedroom?  Use night lights.  Make sure that you have a light by your bed that is easy to reach.  Do not use any sheets or blankets that are too big for your bed. They should not  hang down onto the floor.  Have a firm chair that has side arms. You can use this for support while you get dressed.  Do not have throw rugs and other things on the floor that can make you trip. What can I do in the kitchen?  Clean up any spills right away.  Avoid walking on wet floors.  Keep items that you use a lot in easy-to-reach places.  If you need to reach something above you, use a strong step stool that has a grab bar.  Keep electrical cords out of the way.  Do not use floor polish or wax that makes floors slippery. If you must use wax, use non-skid floor wax.  Do not have throw rugs and other things on the floor that can make you trip. What can I do with my  stairs?  Do not leave any items on the stairs.  Make sure that there are handrails on both sides of the stairs and use them. Fix handrails that are broken or loose. Make sure that handrails are as long as the stairways.  Check any carpeting to make sure that it is firmly attached to the stairs. Fix any carpet that is loose or worn.  Avoid having throw rugs at the top or bottom of the stairs. If you do have throw rugs, attach them to the floor with carpet tape.  Make sure that you have a light switch at the top of the stairs and the bottom of the stairs. If you do not have them, ask someone to add them for you. What else can I do to help prevent falls?  Wear shoes that:  Do not have high heels.  Have rubber bottoms.  Are comfortable and fit you well.  Are closed at the toe. Do not wear sandals.  If you use a stepladder:  Make sure that it is fully opened. Do not climb a closed stepladder.  Make sure that both sides of the stepladder are locked into place.  Ask someone to hold it for you, if possible.  Clearly mark and make sure that you can see:  Any grab bars or handrails.  First and last steps.  Where the edge of each step is.  Use tools that help you move around (mobility aids) if they are needed. These include:  Canes.  Walkers.  Scooters.  Crutches.  Turn on the lights when you go into a dark area. Replace any light bulbs as soon as they burn out.  Set up your furniture so you have a clear path. Avoid moving your furniture around.  If any of your floors are uneven, fix them.  If there are any pets around you, be aware of where they are.  Review your medicines with your doctor. Some medicines can make you feel dizzy. This can increase your chance of falling. Ask your doctor what other things that you can do to help prevent falls. This information is not intended to replace advice given to you by your health care provider. Make sure you discuss any  questions you have with your health care provider. Document Released: 06/02/2009 Document Revised: 01/12/2016 Document Reviewed: 09/10/2014 Elsevier Interactive Patient Education  2017 [employer].

## 2020-05-10 NOTE — Assessment & Plan Note (Signed)
Chronic Symptoms improved with stopping fiber pills At this point not having any GERD Will take omeprazole only as needed

## 2020-05-10 NOTE — Assessment & Plan Note (Signed)
Chronic Depression is much better Continue current dose of medication - prozac

## 2020-05-10 NOTE — Assessment & Plan Note (Signed)
Chronic Has dyspnea on exertion She is taking the Stiolto, but is not sure if she is getting the medication-she does not like the mechanism of the inhaler Will try switching to a different inhaler if we can find one covered Advised to use albuterol only as needed Stressed smoking cessation-we will prescribe Chantix, which has worked in the past May need to see pulmonary if her shortness of breath does not improve with the inhaler

## 2020-05-10 NOTE — Assessment & Plan Note (Signed)
Chronic Seen on imaging She is taking rosuvastatin 10 mg daily Stressed that she needs to be exercising regularly Discussed that we should consider increasing the rosuvastatin to get her LDL to goal, but she does not want to do this at this time

## 2020-05-11 ENCOUNTER — Encounter: Payer: Self-pay | Admitting: Internal Medicine

## 2020-05-11 DIAGNOSIS — M810 Age-related osteoporosis without current pathological fracture: Secondary | ICD-10-CM | POA: Diagnosis not present

## 2020-05-11 DIAGNOSIS — Z780 Asymptomatic menopausal state: Secondary | ICD-10-CM | POA: Diagnosis not present

## 2020-05-11 DIAGNOSIS — Z1231 Encounter for screening mammogram for malignant neoplasm of breast: Secondary | ICD-10-CM | POA: Diagnosis not present

## 2020-05-18 DIAGNOSIS — Z961 Presence of intraocular lens: Secondary | ICD-10-CM | POA: Diagnosis not present

## 2020-05-21 ENCOUNTER — Encounter: Payer: Self-pay | Admitting: Internal Medicine

## 2020-05-21 DIAGNOSIS — M810 Age-related osteoporosis without current pathological fracture: Secondary | ICD-10-CM | POA: Insufficient documentation

## 2020-07-03 ENCOUNTER — Other Ambulatory Visit: Payer: Self-pay | Admitting: Internal Medicine

## 2020-07-05 DIAGNOSIS — H903 Sensorineural hearing loss, bilateral: Secondary | ICD-10-CM | POA: Diagnosis not present

## 2020-07-30 ENCOUNTER — Other Ambulatory Visit: Payer: Self-pay | Admitting: Internal Medicine

## 2020-07-30 DIAGNOSIS — E7800 Pure hypercholesterolemia, unspecified: Secondary | ICD-10-CM

## 2020-08-23 IMAGING — CT CT ANGIOGRAPHY CHEST
2 of 6 series · 18 of 46 positions shown · IV contrast (APPLIED)
Comparison: Chest radiograph dated 03/31/2019

CLINICAL DATA: 67-year-old female with shortness of breath. Status
post right shoulder surgery.

EXAM:
CT ANGIOGRAPHY CHEST WITH CONTRAST
TECHNIQUE: Multidetector CT imaging of the chest was performed using the
standard protocol during bolus administration of intravenous
contrast. Multiplanar CT image reconstructions and MIPs were
obtained to evaluate the vascular anatomy.
CONTRAST:  80mL OMNIPAQUE IOHEXOL 350 MG/ML SOLN

[Series 5: thins · axial · 0.60mm/px · z∈[+1444,+1680]mm · 16 of 260 slices shown]
[im 12/260  lung]
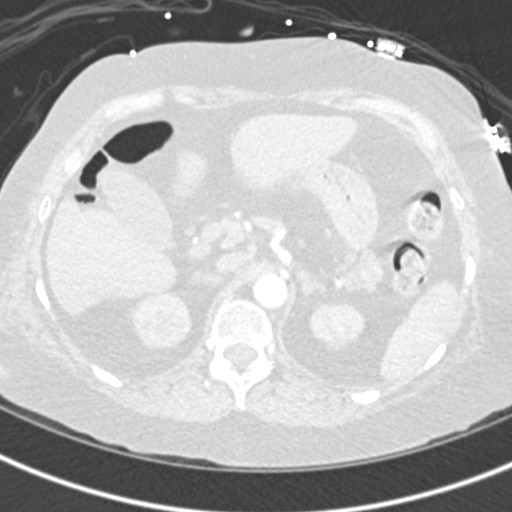
[im 34/260  soft-tissue]
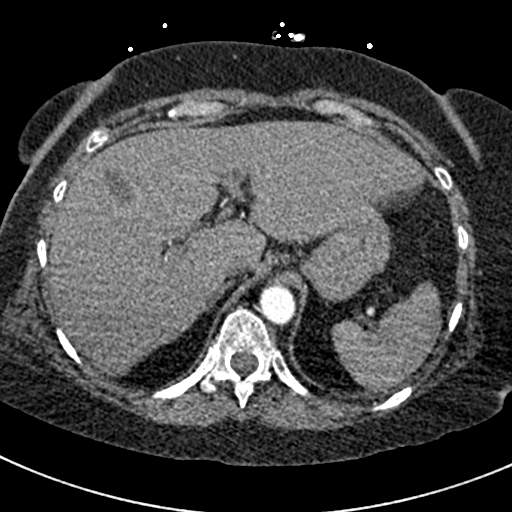
[im 46/260  lung]
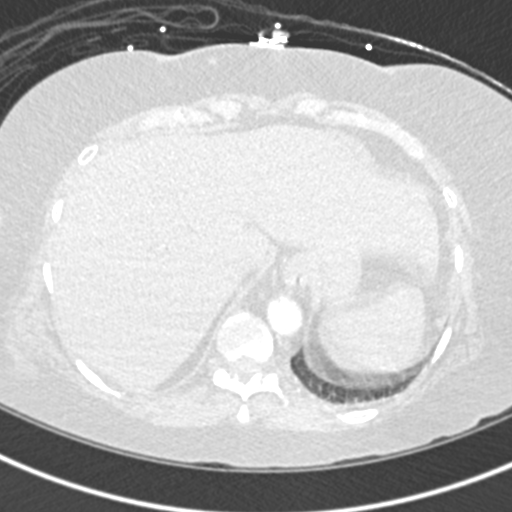
[im 57/260  soft-tissue]
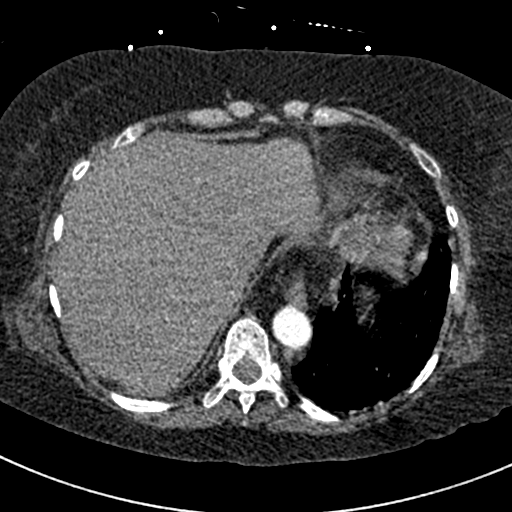
[im 79/260  lung]
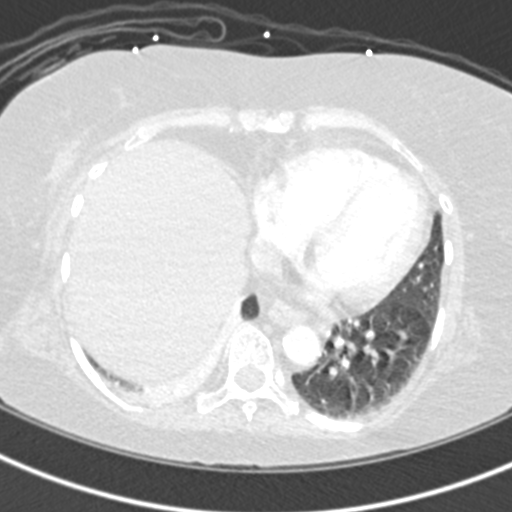
[im 91/260  soft-tissue]
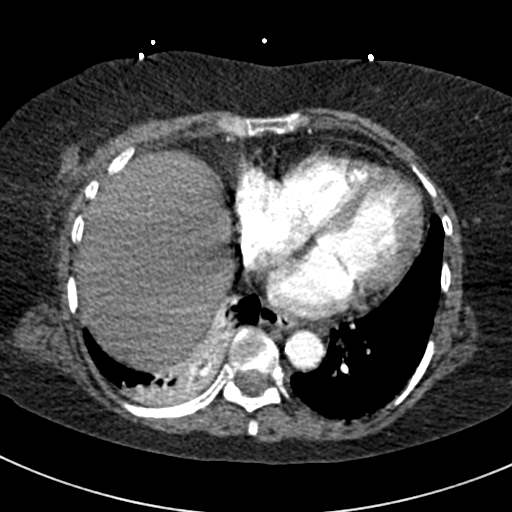
[im 102/260  lung]
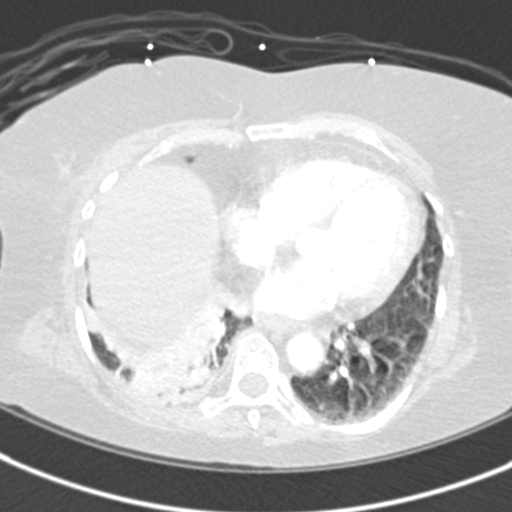
[im 124/260  soft-tissue]
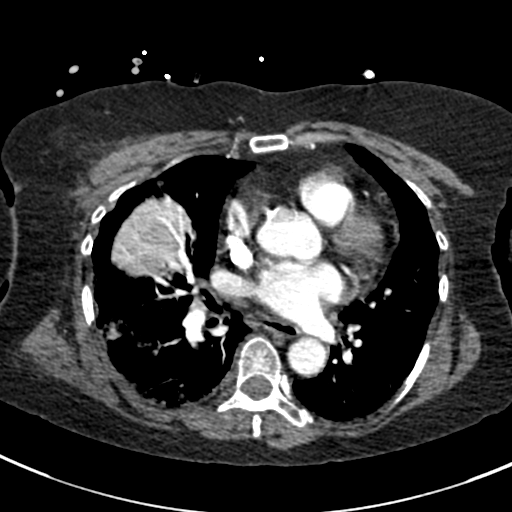
[im 136/260  lung]
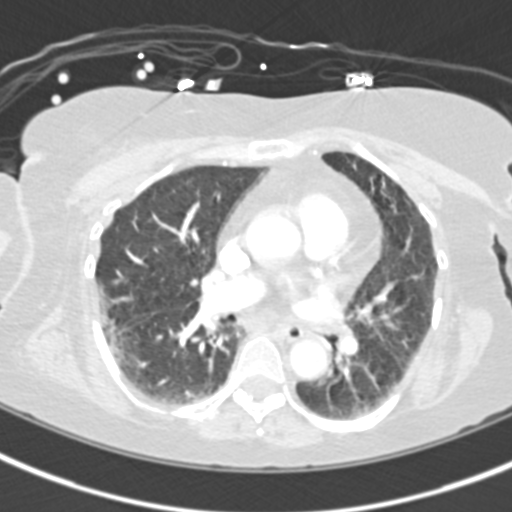
[im 158/260  soft-tissue]
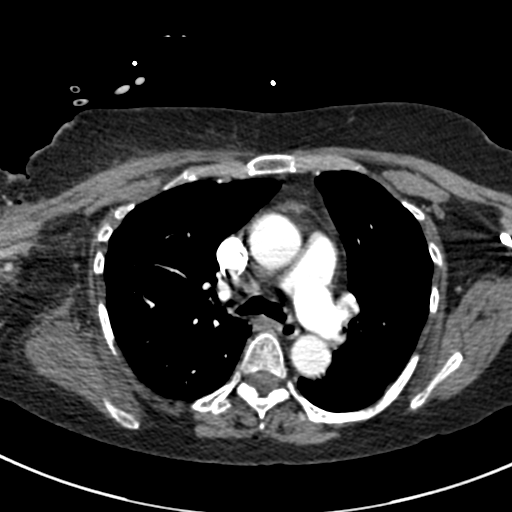
[im 169/260  lung]
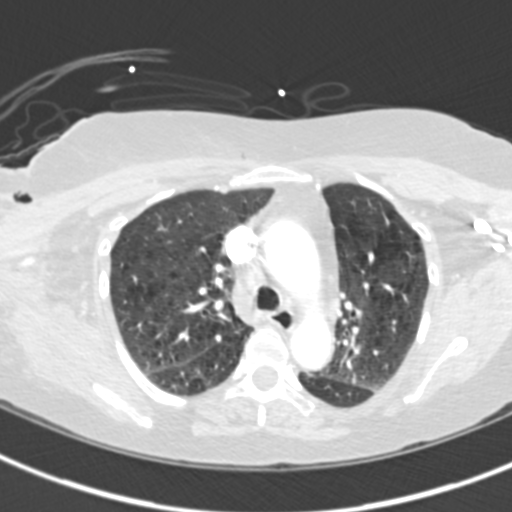
[im 181/260  soft-tissue]
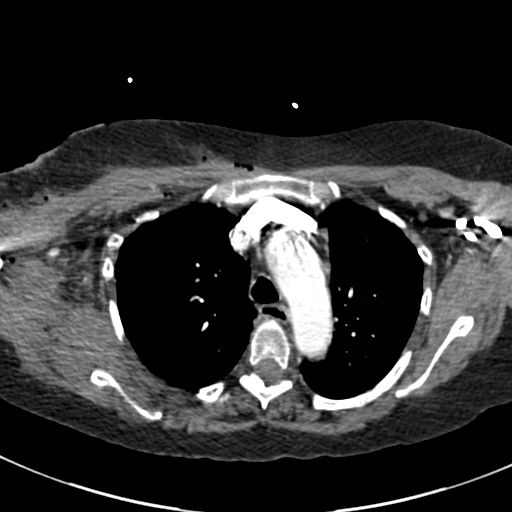
[im 203/260  lung]
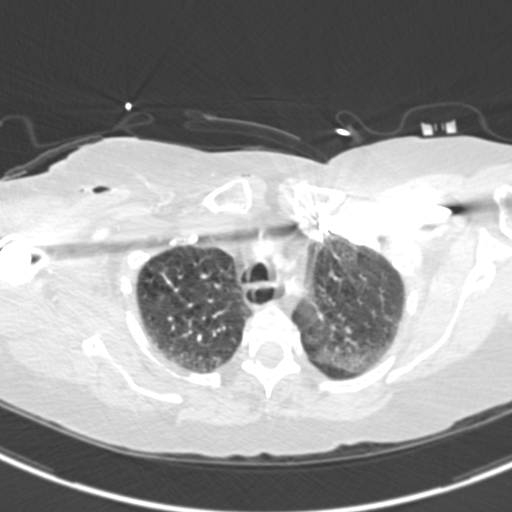
[im 214/260  soft-tissue]
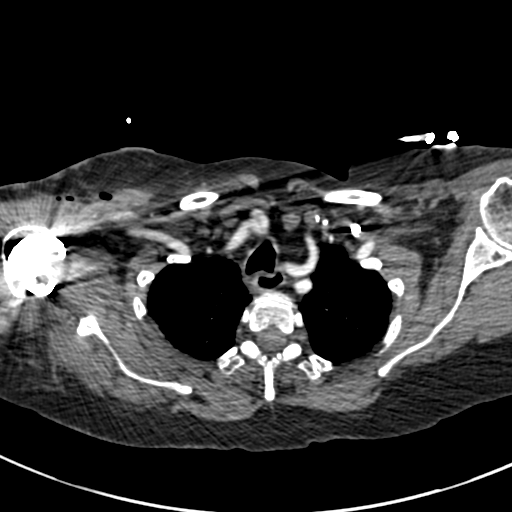
[im 226/260  lung]
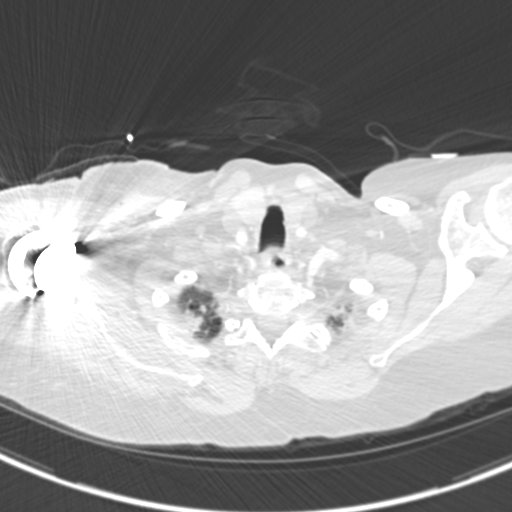
[im 248/260  soft-tissue]
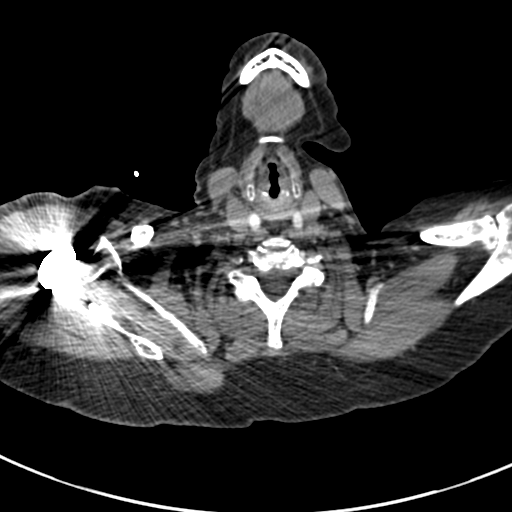

[Series 7: coronal mpr · coronal · 0.53mm/px · 2 of 82 slices shown]
[im 28/82  soft-tissue]
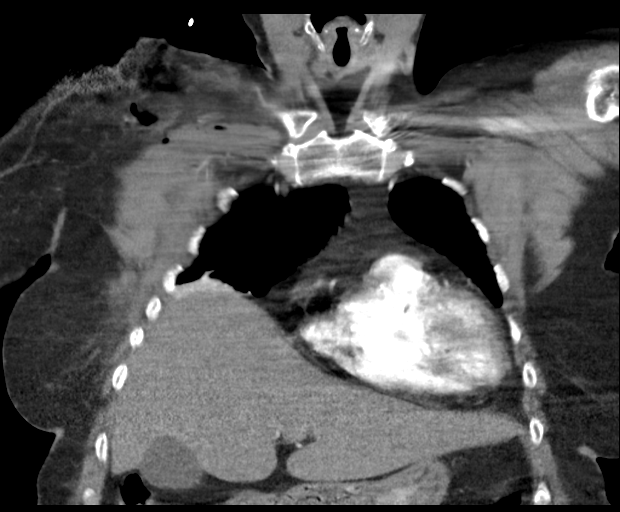
[im 55/82  soft-tissue]
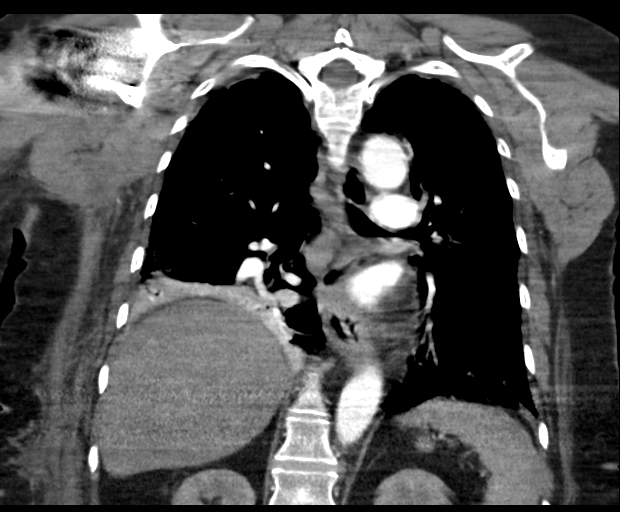

[18 of 46 positions shown; findings below may reference images not displayed]

FINDINGS: Evaluation of this exam is limited due to respiratory motion
artifact.

Cardiovascular: There is mild cardiomegaly. No pericardial effusion.
Mild atherosclerotic calcification of the thoracic aorta. No
aneurysmal dilatation or dissection. The visualized origins of the
great vessels of the aortic arch appear patent. Left carotid bulb
calcified plaques noted. Evaluation of the pulmonary arteries is
limited due to respiratory motion artifact and suboptimal
visualization of the peripheral branches. No pulmonary artery
embolus identified.

Mediastinum/Nodes: There is no hilar or mediastinal adenopathy. The
esophagus is grossly unremarkable. There is a 5 mm calcified nodule
in the left lobe of the thyroid gland. No mediastinal fluid
collection.

Lungs/Pleura: There is eventration of the right hemidiaphragm.
Patchy area of consolidative change at the right lung base may
represent atelectasis. Pneumonia or aspiration is not excluded.
Clinical correlation is recommended. There is a background of
emphysema. There is no pleural effusion or pneumothorax. The central
airways remain patent.

Upper Abdomen: Apparent fatty infiltration of the liver. The
visualized upper abdomen is otherwise unremarkable.

Musculoskeletal: Postsurgical changes of right shoulder
arthroplasty. There are pockets of air in the superficial soft
tissues of the right axilla and right chest wall. No drainable fluid
collection identified. No acute osseous pathology.

Review of the MIP images confirms the above findings.
IMPRESSION: 1. No CT evidence of central pulmonary artery embolus.
2. Right lung base atelectasis versus infiltrate. Clinical
correlation is recommended.
3. Emphysema.
4. Postsurgical changes of right shoulder arthroplasty.
5. Aortic Atherosclerosis (FAU8C-ZHM.M) and Emphysema ([id number].D).

## 2020-08-23 IMAGING — CR CHEST - 2 VIEW
2 series · 2 of 2 positions shown · non-contrast
Comparison: October 31, 2017

CLINICAL DATA: Shortness of breath

EXAM:
CHEST - 2 VIEW

[w chest pa]
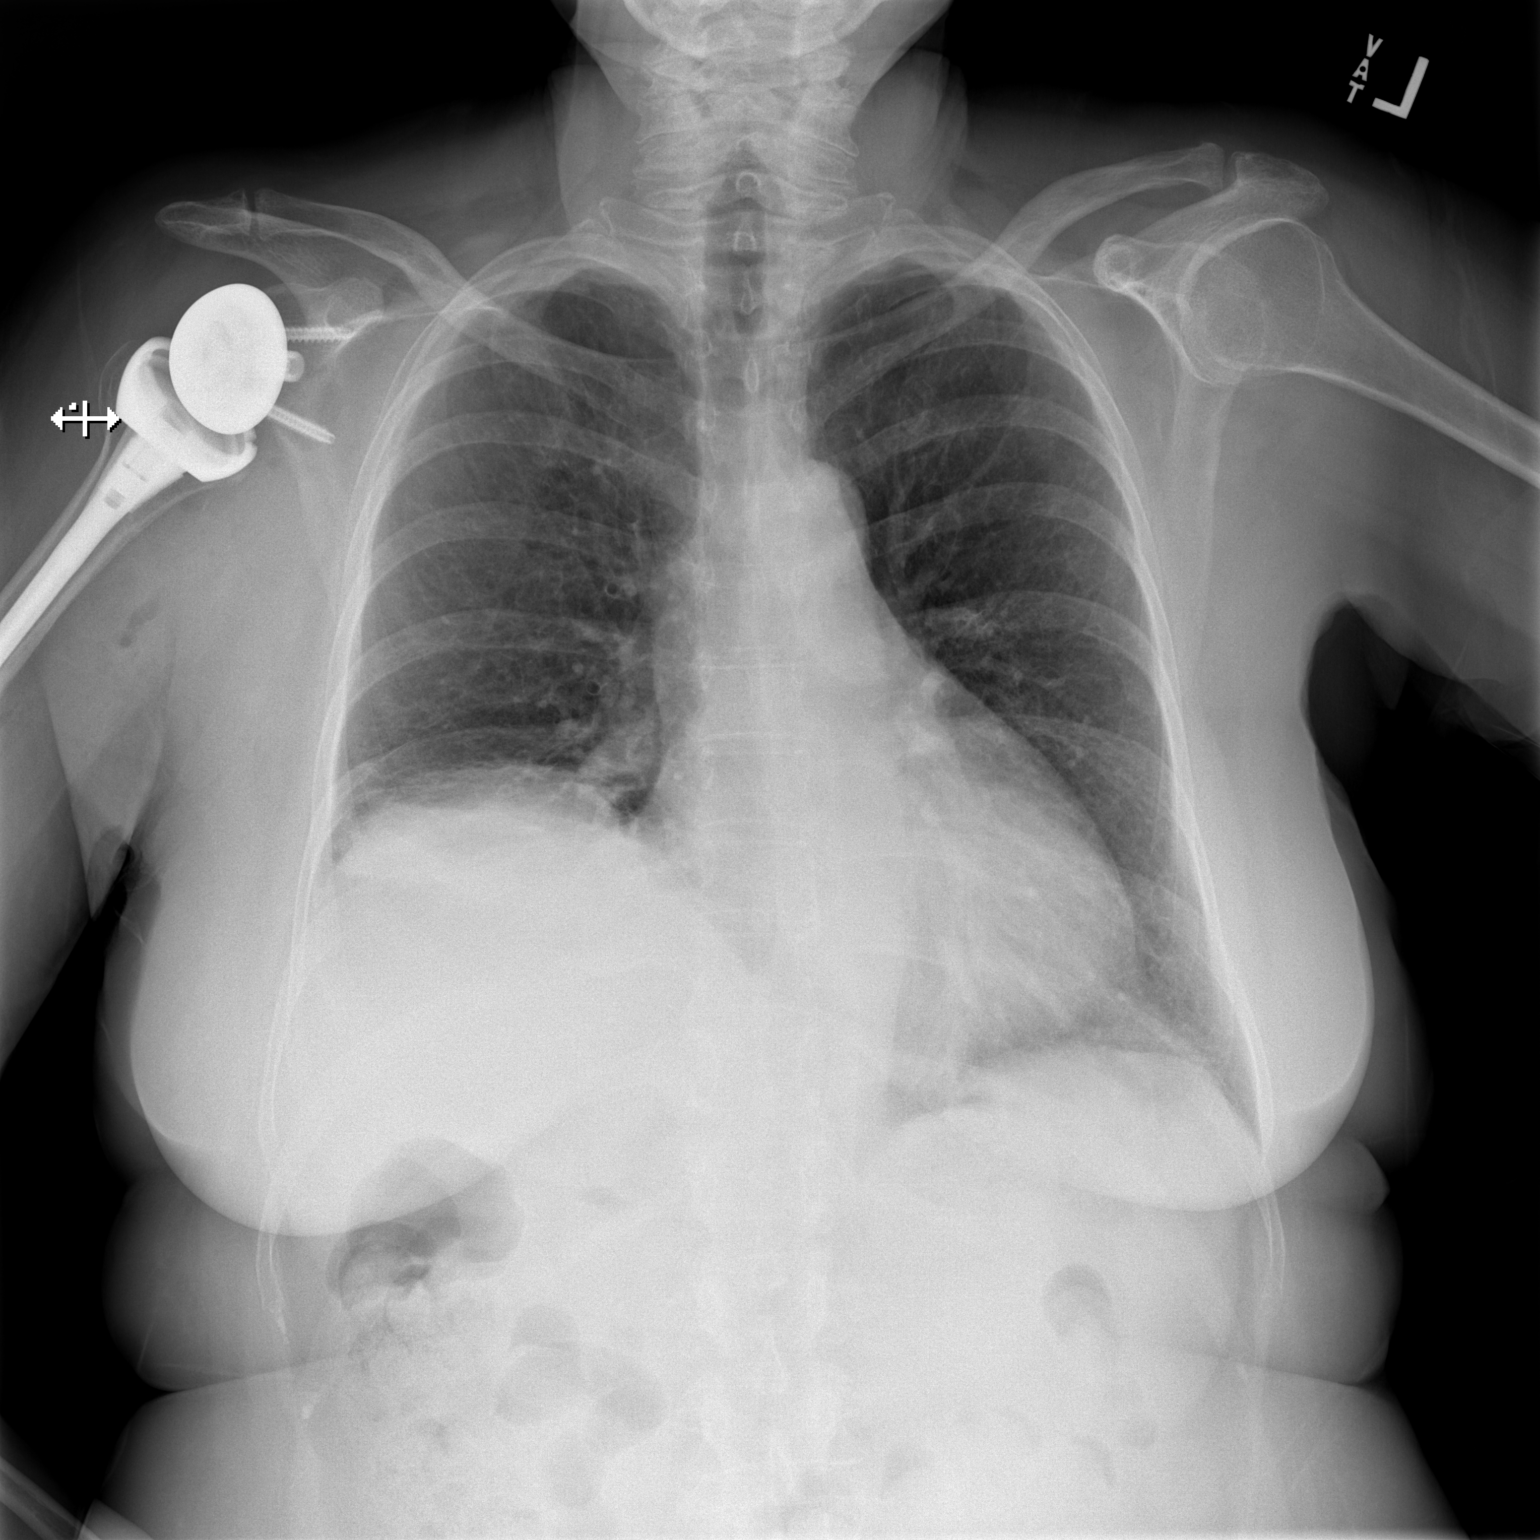

[w chest lat]
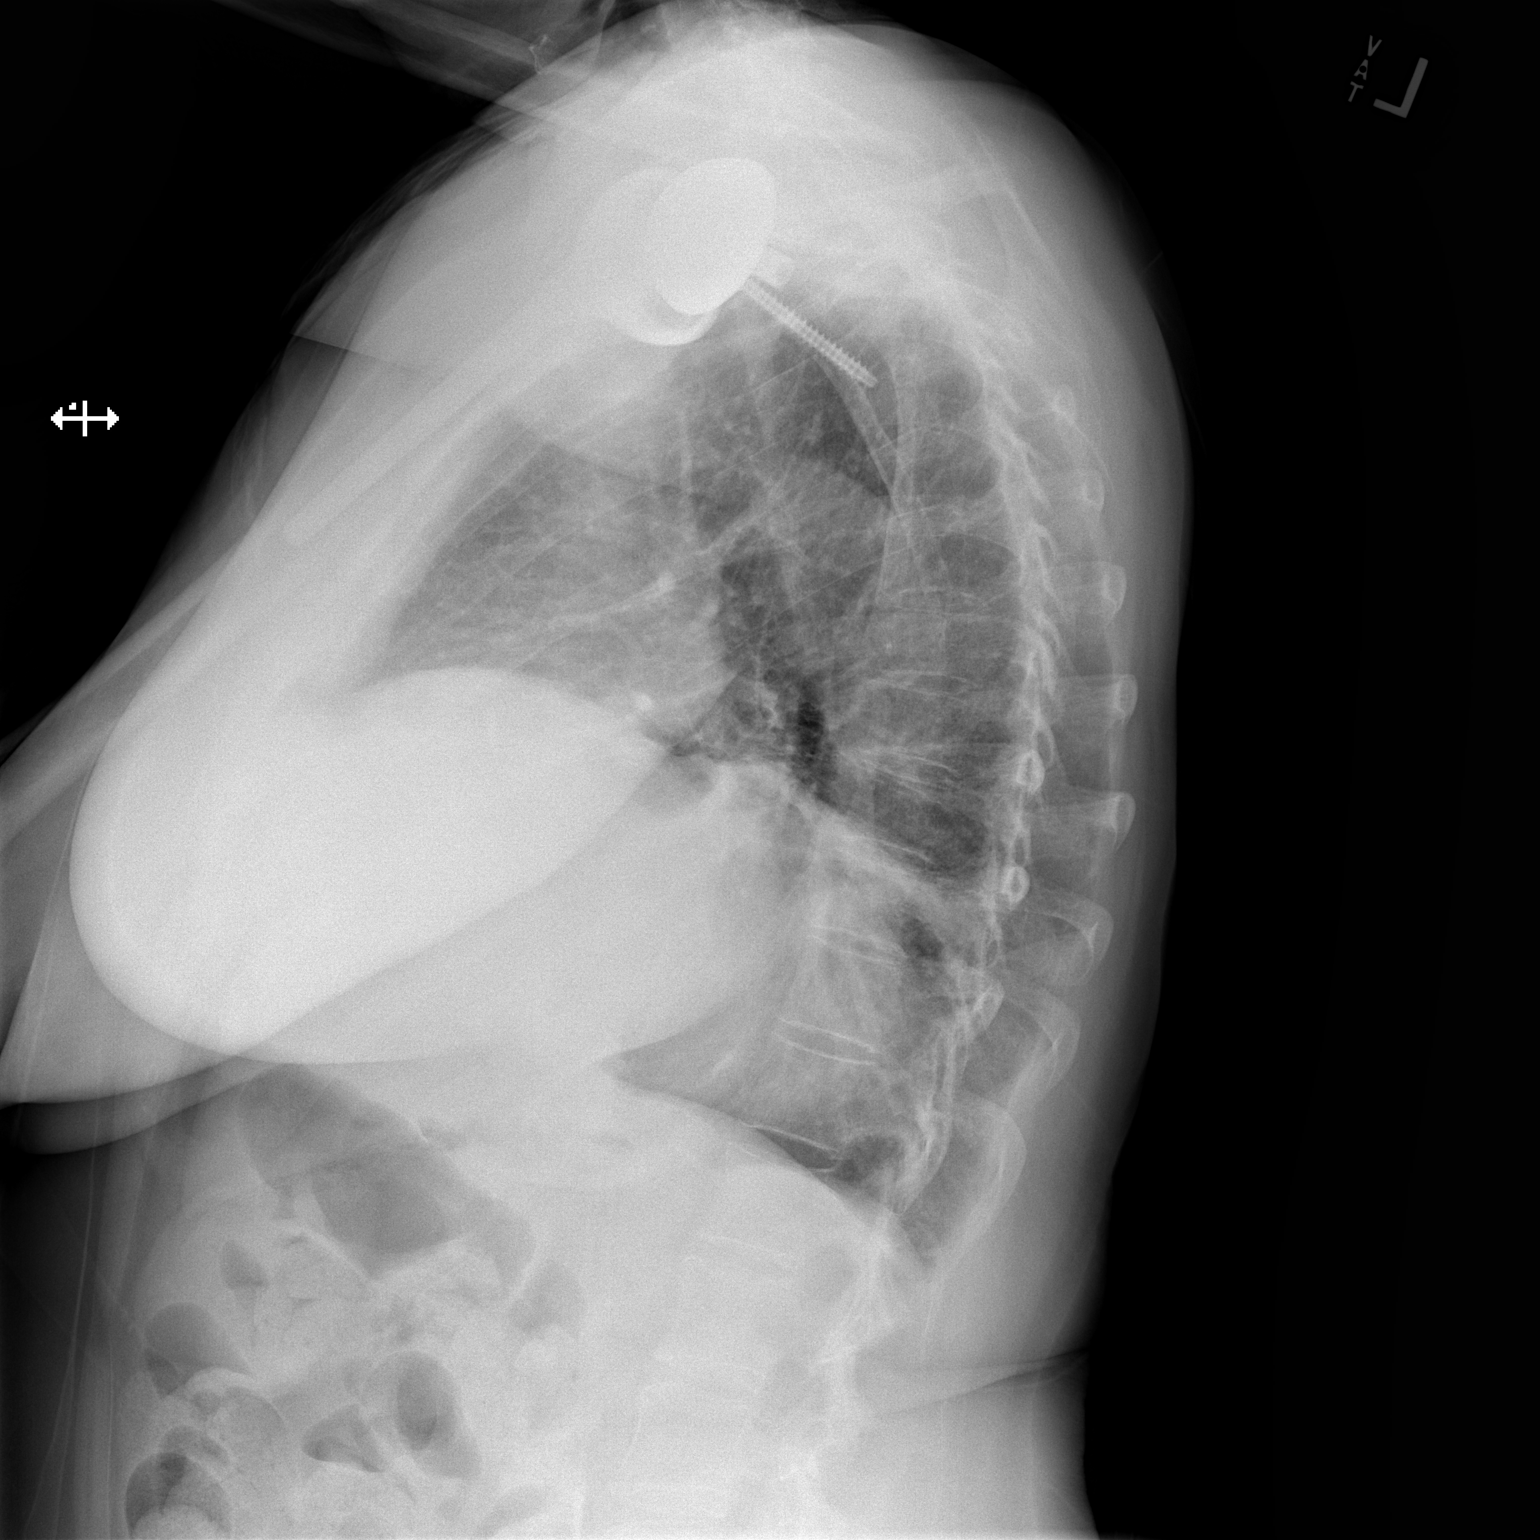

[2 of 2 positions shown; findings below may reference images not displayed]

FINDINGS: There is elevation of the right hemidiaphragm. Streaky opacity seen
at the right lung base. The left lung is clear. The
cardiomediastinal silhouette is unchanged. Patient is status post
right total shoulder arthroplasty with overlying subcutaneous
emphysema.
IMPRESSION: 1. Elevation of the right hemidiaphragm.
2. Streaky opacity at the right lung base which could be due to
atelectasis and/or aspiration.

## 2020-09-07 DIAGNOSIS — M25572 Pain in left ankle and joints of left foot: Secondary | ICD-10-CM | POA: Diagnosis not present

## 2020-09-07 DIAGNOSIS — M25562 Pain in left knee: Secondary | ICD-10-CM | POA: Diagnosis not present

## 2020-10-02 ENCOUNTER — Other Ambulatory Visit: Payer: Self-pay | Admitting: Internal Medicine

## 2020-10-05 DIAGNOSIS — M25562 Pain in left knee: Secondary | ICD-10-CM | POA: Diagnosis not present

## 2020-10-12 ENCOUNTER — Other Ambulatory Visit: Payer: Self-pay | Admitting: Internal Medicine

## 2020-10-22 ENCOUNTER — Other Ambulatory Visit: Payer: Self-pay | Admitting: Internal Medicine

## 2020-10-22 DIAGNOSIS — F988 Other specified behavioral and emotional disorders with onset usually occurring in childhood and adolescence: Secondary | ICD-10-CM

## 2020-10-24 ENCOUNTER — Other Ambulatory Visit: Payer: Self-pay | Admitting: Internal Medicine

## 2020-11-07 ENCOUNTER — Encounter: Payer: Self-pay | Admitting: Registered Nurse

## 2020-11-07 ENCOUNTER — Ambulatory Visit: Payer: Medicare PPO | Admitting: Registered Nurse

## 2020-11-07 ENCOUNTER — Other Ambulatory Visit: Payer: Self-pay

## 2020-11-07 VITALS — BP 121/76 | HR 100 | Temp 98.5°F | Resp 18 | Ht 61.0 in | Wt 142.2 lb

## 2020-11-07 DIAGNOSIS — H60503 Unspecified acute noninfective otitis externa, bilateral: Secondary | ICD-10-CM

## 2020-11-07 DIAGNOSIS — M26622 Arthralgia of left temporomandibular joint: Secondary | ICD-10-CM | POA: Diagnosis not present

## 2020-11-07 MED ORDER — HYDROCORTISONE-ACETIC ACID 1-2 % OT SOLN: 3.0000 [drp] | Freq: Two times a day (BID) | OTIC | 0 refills | Status: DC

## 2020-11-07 MED ORDER — CYCLOBENZAPRINE HCL 5 MG PO TABS: 5.0000 mg | ORAL_TABLET | Freq: Two times a day (BID) | ORAL | 1 refills | Status: DC | PRN

## 2020-11-07 NOTE — Patient Instructions (Signed)
° ° ° °  If you have lab work done today you will be contacted with your lab results within the next 2 weeks.  If you have not heard from us then please contact us. The fastest way to get your results is to register for My Chart. ° ° °IF you received an x-ray today, you will receive an invoice from [facility]. Please contact  Radiology at [phone] with questions or concerns regarding your invoice.  ° °IF you received labwork today, you will receive an invoice from [facility]. Please contact [facility] at [phone] with questions or concerns regarding your invoice.  ° °Our billing staff will not be able to assist you with questions regarding bills from these companies. ° °You will be contacted with the lab results as soon as they are available. The fastest way to get your results is to activate your My Chart account. Instructions are located on the last page of this paperwork. If you have not heard from us regarding the results in 2 weeks, please contact this office. °  ° ° ° °

## 2020-11-11 ENCOUNTER — Other Ambulatory Visit: Payer: Self-pay | Admitting: Internal Medicine

## 2020-12-11 ENCOUNTER — Other Ambulatory Visit: Payer: Self-pay | Admitting: Internal Medicine

## 2020-12-13 NOTE — Progress Notes (Signed)
Subjective:    Patient ID: Sheryl [name], female    DOB: 1950/10/08, 70 y.o.   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  Still has left ear pain. She is clenching more.  She was prescribed flexeril 5 mg and ear drops.  She is not taking the Flexeril regularly.  She is trying to quit smoking.  Would like help.  She gardens.     Medications and allergies reviewed with patient and updated if appropriate.  Patient Active Problem List   Diagnosis Date Noted  . Tobacco abuse 12/14/2020  . Osteoporosis 05/21/2020  . Aortic atherosclerosis ([place]) 05/09/2020  . GERD (gastroesophageal reflux disease) 04/12/2020  . Acute respiratory failure with hypoxia ([place]) 04/01/2019  . Community acquired pneumonia 04/01/2019  . Anemia 04/01/2019  . 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 Vertigo 06/05/2018  . 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 CKD (chronic kidney disease) 12/27/2016  . Insomnia 06/29/2016  . Prediabetes 06/29/2016  . 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 COPD (chronic obstructive pulmonary disease) ([place]) 12/09/2014  . Lumbago 07/16/2014  . Bilateral hand pain 07/16/2014  . HTN (hypertension) 06/04/2012  . Hyperlipidemia 06/04/2012  . Depression 06/04/2012  . ADD (attention deficit disorder) 06/04/2012  . AR (allergic rhinitis) 06/04/2012    Current Outpatient Medications on File Prior to Visit  Medication Sig Dispense Refill  . acetic acid-hydrocortisone (VOSOL-HC) OTIC solution Place 3 drops into both ears 2 (two) times daily. 10 mL 0  . albuterol (VENTOLIN HFA) 108 (90 Base) MCG/ACT inhaler Inhale 1-2 puffs into the lungs every 4 (four) hours as needed for wheezing or shortness of breath. 16 g 8  . amLODipine (NORVASC) 5 MG tablet TAKE 1 TABLET BY  MOUTH EVERY DAY 90 tablet 1  . aspirin 81 MG chewable tablet Chew 162 mg by mouth daily.    Marland Kitchen atomoxetine (STRATTERA) 100 MG capsule TAKE 1 CAPSULE BY MOUTH EVERY DAY 90 capsule 2  . Budeson-Glycopyrrol-Formoterol (BREZTRI AEROSPHERE) 160-9-4.8 MCG/ACT AERO Inhale 2 puffs into the lungs 2 (two) times daily. 10.7 g 5  . cyclobenzaprine (FLEXERIL) 5 MG tablet Take 1 tablet (5 mg total) by mouth 2 (two) times daily as needed for muscle spasms. 30 tablet 1  . FLUoxetine (PROZAC) 20 MG capsule TAKE 1 CAPSULE (20 MG TOTAL) BY MOUTH DAILY IN ADDITION TO 40 MG FOR A TOTAL OF 60 MG. 90 capsule 0  . FLUoxetine (PROZAC) 40 MG capsule TAKE 1 CAPSULE (40 MG TOTAL) BY MOUTH DAILY. IN ADDITION TO 20 MG FOR TOTAL OF 60 MG DAILY 90 capsule 0  . fluticasone (FLONASE) 50 MCG/ACT nasal spray Place into both nostrils daily.    . hydrochlorothiazide (HYDRODIURIL) 12.5 MG tablet TAKE 1 TABLET BY MOUTH EVERY DAY 90 tablet 1  . losartan (COZAAR) 100 MG tablet TAKE 1 TABLET BY MOUTH EVERY DAY 90 tablet 1  . meclizine (ANTIVERT) 25 MG tablet Take 1 tablet (25 mg total) by mouth 3 (three) times daily as needed for dizziness. 30 tablet 0  . omeprazole (PRILOSEC) 20 MG capsule Take 1 capsule (20 mg total) by mouth daily. 90 capsule 1  . polyethylene glycol powder (GLYCOLAX/MIRALAX) 17 GM/SCOOP powder polyethylene glycol 3350 17 gram oral powder packet  TAKE 17 G BY MOUTH 2 (TWO) TIMES DAILY.    Marland Kitchen  rosuvastatin (CRESTOR) 10 MG tablet TAKE 1 TABLET BY MOUTH EVERY DAY 90 tablet 2  . traZODone (DESYREL) 50 MG tablet TAKE 1 TABLET BY MOUTH EVERYDAY AT BEDTIME 90 tablet 0  . TURMERIC PO Take 1,000 mg by mouth daily.     . vitamin B-12 100 MCG tablet Take 1 tablet (100 mcg total) by mouth daily. 30 tablet 0  . ZINC PICOLINATE PO Take 50 mg by mouth daily.     No current facility-administered medications on file prior to visit.    Past Medical History:  Diagnosis Date  . ADD (attention deficit disorder)   . Allergy   .  Arthritis   . CKD (chronic kidney disease), stage III ([place])   . COPD (chronic obstructive pulmonary disease) ([place]) 12/09/2014  . Depression   . Does use hearing aid   . Dyspnea    on exertion  . Hyperlipidemia   . Hypertension   . LGSIL (low grade squamous intraepithelial dysplasia)   . Osteoporosis   . Pre-diabetes     Past Surgical History:  Procedure Laterality Date  . JOINT REPLACEMENT     right knee  . MULTIPLE TOOTH EXTRACTIONS    . REPLACEMENT TOTAL KNEE  2013   right  . REVERSE SHOULDER ARTHROPLASTY Right 03/30/2019   Procedure: REVERSE TOTAL SHOULDER ARTHROPLASTY;  Surgeon: Netta Cedars, MD;  Location: [facility];  Service: Orthopedics;  Laterality: Right;  interscalene block  . TOTAL KNEE ARTHROPLASTY Left 12/27/2017  . TOTAL KNEE ARTHROPLASTY Left 12/27/2017   Procedure: LEFT TOTAL KNEE ARTHROPLASTY;  Surgeon: Netta Cedars, MD;  Location: [facility];  Service: Orthopedics;  Laterality: Left;    Social History   Socioeconomic History  . Marital status: Divorced    Spouse name: Not on file  . Number of children: 2  . Years of education: Not on file  . Highest education level: Not on file  Occupational History  . Not on file  Tobacco Use  . Smoking status: Current Every Day Smoker    Packs/day: 0.25    Years: 49.00    Pack years: 12.25    Types: Cigarettes  . Smokeless tobacco: Never Used  . Tobacco comment: Trying quit smoking!, resumed    Vaping Use  . Vaping Use: Former  Substance and Sexual Activity  . Alcohol use: No    Alcohol/week: 0.0 standard drinks  . Drug use: No  . Sexual activity: Not Currently  Other Topics Concern  . Not on file  Social History Narrative  . Not on file   Social Determinants of Health   Financial Resource Strain: Low Risk   . Difficulty of Paying Living Expenses: Not hard at all  Food Insecurity: No Food Insecurity  . Worried About Charity fundraiser in the Last Year: Never true  . Ran Out of Food in the Last Year: Never  true  Transportation Needs: No Transportation Needs  . Lack of Transportation (Medical): No  . Lack of Transportation (Non-Medical): No  Physical Activity: Sufficiently Active  . Days of Exercise per Week: 7 days  . Minutes of Exercise per Session: 30 min  Stress: No Stress Concern Present  . Feeling of Stress : Not at all  Social Connections: Moderately Integrated  . Frequency of Communication with Friends and Family: More than three times a week  . Frequency of Social Gatherings with Friends and Family: More than three times a week  . Attends Religious Services: More than 4 times per year  .  Active Member of Clubs or Organizations: Yes  . Attends Archivist Meetings: More than 4 times per year  . Marital Status: Never married    Family History  Problem Relation Age of Onset  . Hyperlipidemia Mother   . Alcohol abuse Father   . Arthritis Father   . Colon polyps Father   . Alcohol abuse Maternal Uncle   . Stroke Maternal Grandmother     Review of Systems  Constitutional: Negative for chills and fever.  HENT: Positive for ear pain.   Respiratory: Positive for cough, shortness of breath and wheezing.   Cardiovascular: Negative for chest pain, palpitations and leg swelling.  Neurological: Positive for headaches (in afternoon). Negative for light-headedness.  Psychiatric/Behavioral: Positive for sleep disturbance.       Objective:   Vitals:   12/14/20 1415  BP: 120/70  Pulse: 100  Temp: 98.4 F (36.9 C)  SpO2: 96%   BP Readings from Last 3 Encounters:  12/14/20 120/70  11/07/20 121/76  05/10/20 120/80   Wt Readings from Last 3 Encounters:  12/14/20 143 lb 9.6 oz (65.1 kg)  11/07/20 142 lb 3.2 oz (64.5 kg)  05/10/20 142 lb 6.4 oz (64.6 kg)   Body mass index is 27.13 kg/m.   Physical Exam    Constitutional: Appears well-developed and well-nourished. No distress.  HENT:  Head: Normocephalic and atraumatic. Ears: Bilateral ear canals with minimal  cerumen, no erythema or discharge, TMs bilaterally normal Neck: Neck supple. No tracheal deviation present. No thyromegaly present.  No cervical lymphadenopathy Cardiovascular: Normal rate, regular rhythm and normal heart sounds.   No murmur heard. No carotid bruit .  No edema Pulmonary/Chest: Effort normal and breath sounds normal. No respiratory distress. No has no wheezes. No rales.  Skin: Skin is warm and dry. Not diaphoretic.  Psychiatric: Normal mood and affect. Behavior is normal.      Assessment & Plan:    See Problem List for Assessment and Plan of chronic medical problems.    This visit occurred during the SARS-CoV-2 public health emergency.  Safety protocols were in place, including screening questions prior to the visit, additional usage of staff PPE, and extensive cleaning of exam room while observing appropriate contact time as indicated for disinfecting solutions.

## 2020-12-13 NOTE — Patient Instructions (Addendum)
  Blood work was ordered.     Medications changes include :  requip at night for your restless leg. Take the muscle relaxer at night.  Hold the trazodone temporarily.     A referral was ordered for smoking program.        Someone from their office will call you to schedule an appointment.     Please followup in 4 months

## 2020-12-14 ENCOUNTER — Ambulatory Visit: Payer: Medicare PPO | Admitting: Internal Medicine

## 2020-12-14 ENCOUNTER — Encounter: Payer: Self-pay | Admitting: Internal Medicine

## 2020-12-14 ENCOUNTER — Other Ambulatory Visit: Payer: Self-pay

## 2020-12-14 VITALS — BP 120/70 | HR 100 | Temp 98.4°F | Ht 61.0 in | Wt 143.6 lb

## 2020-12-14 DIAGNOSIS — G4709 Other insomnia: Secondary | ICD-10-CM

## 2020-12-14 DIAGNOSIS — Z720 Tobacco use: Secondary | ICD-10-CM

## 2020-12-14 DIAGNOSIS — J449 Chronic obstructive pulmonary disease, unspecified: Secondary | ICD-10-CM

## 2020-12-14 DIAGNOSIS — F17210 Nicotine dependence, cigarettes, uncomplicated: Secondary | ICD-10-CM | POA: Diagnosis not present

## 2020-12-14 DIAGNOSIS — F3289 Other specified depressive episodes: Secondary | ICD-10-CM

## 2020-12-14 DIAGNOSIS — E782 Mixed hyperlipidemia: Secondary | ICD-10-CM | POA: Diagnosis not present

## 2020-12-14 DIAGNOSIS — I10 Essential (primary) hypertension: Secondary | ICD-10-CM

## 2020-12-14 DIAGNOSIS — R7303 Prediabetes: Secondary | ICD-10-CM

## 2020-12-14 DIAGNOSIS — N1832 Chronic kidney disease, stage 3b: Secondary | ICD-10-CM

## 2020-12-14 DIAGNOSIS — K219 Gastro-esophageal reflux disease without esophagitis: Secondary | ICD-10-CM

## 2020-12-14 DIAGNOSIS — F988 Other specified behavioral and emotional disorders with onset usually occurring in childhood and adolescence: Secondary | ICD-10-CM

## 2020-12-14 DIAGNOSIS — M26622 Arthralgia of left temporomandibular joint: Secondary | ICD-10-CM

## 2020-12-14 DIAGNOSIS — I700 Atherosclerosis of aorta: Secondary | ICD-10-CM

## 2020-12-14 DIAGNOSIS — G2581 Restless legs syndrome: Secondary | ICD-10-CM | POA: Insufficient documentation

## 2020-12-14 DIAGNOSIS — M26629 Arthralgia of temporomandibular joint, unspecified side: Secondary | ICD-10-CM | POA: Insufficient documentation

## 2020-12-14 LAB — CBC WITH DIFFERENTIAL/PLATELET
Basophils Absolute: 0.1 10*3/uL (ref 0.0–0.1)
Basophils Relative: 1 % (ref 0.0–3.0)
Eosinophils Absolute: 0.5 10*3/uL (ref 0.0–0.7)
Eosinophils Relative: 7.3 % — ABNORMAL HIGH (ref 0.0–5.0)
HCT: 34.4 % — ABNORMAL LOW (ref 36.0–46.0)
Hemoglobin: 11.5 g/dL — ABNORMAL LOW (ref 12.0–15.0)
Lymphocytes Relative: 34.4 % (ref 12.0–46.0)
Lymphs Abs: 2.5 10*3/uL (ref 0.7–4.0)
MCHC: 33.4 g/dL (ref 30.0–36.0)
MCV: 91.4 fl (ref 78.0–100.0)
Monocytes Absolute: 0.6 10*3/uL (ref 0.1–1.0)
Monocytes Relative: 8.6 % (ref 3.0–12.0)
Neutro Abs: 3.6 10*3/uL (ref 1.4–7.7)
Neutrophils Relative %: 48.7 % (ref 43.0–77.0)
Platelets: 363 10*3/uL (ref 150.0–400.0)
RBC: 3.76 Mil/uL — ABNORMAL LOW (ref 3.87–5.11)
RDW: 13.4 % (ref 11.5–15.5)
WBC: 7.3 10*3/uL (ref 4.0–10.5)

## 2020-12-14 LAB — COMPREHENSIVE METABOLIC PANEL
ALT: 22 U/L (ref 0–35)
AST: 22 U/L (ref 0–37)
Albumin: 4 g/dL (ref 3.5–5.2)
Alkaline Phosphatase: 146 U/L — ABNORMAL HIGH (ref 39–117)
BUN: 18 mg/dL (ref 6–23)
CO2: 26 mEq/L (ref 19–32)
Calcium: 9.5 mg/dL (ref 8.4–10.5)
Chloride: 103 mEq/L (ref 96–112)
Creatinine, Ser: 1.22 mg/dL — ABNORMAL HIGH (ref 0.40–1.20)
GFR: 45.01 mL/min — ABNORMAL LOW (ref >60.00)
Glucose, Bld: 103 mg/dL — ABNORMAL HIGH (ref 70–99)
Potassium: 4.1 mEq/L (ref 3.5–5.1)
Sodium: 139 mEq/L (ref 135–145)
Total Bilirubin: 0.3 mg/dL (ref 0.2–1.2)
Total Protein: 8 g/dL (ref 6.0–8.3)

## 2020-12-14 LAB — HEMOGLOBIN A1C: Hgb A1c MFr Bld: 5.8 % (ref 4.6–6.5)

## 2020-12-14 LAB — LIPID PANEL
Cholesterol: 158 mg/dL (ref 0–200)
HDL: 50 mg/dL (ref >39.00)
LDL Cholesterol: 81 mg/dL (ref 0–99)
NonHDL: 107.66
Total CHOL/HDL Ratio: 3
Triglycerides: 134 mg/dL (ref 0.0–149.0)
VLDL: 26.8 mg/dL (ref 0.0–40.0)

## 2020-12-14 LAB — VITAMIN D 25 HYDROXY (VIT D DEFICIENCY, FRACTURES): VITD: 41.42 ng/mL (ref 30.00–100.00)

## 2020-12-14 MED ORDER — ROPINIROLE HCL 0.25 MG PO TABS: 0.2500 mg | ORAL_TABLET | Freq: Every day | ORAL | 5 refills | Status: DC

## 2020-12-14 NOTE — Assessment & Plan Note (Signed)
Smoking cessation was discussed for more than 3 minutes.  The patient was counseled on the dangers of tobacco use, and was advised to quit and referred to a tobacco cessation program.  Reviewed ways of quitting smoking including nicotine replacement, vapping/e-cigarettes, cold [place], weaning off cigarettes, and pharmacotherapy (wellbutrin and chantix).  She has used Chantix in the past and would like to take that again, but currently it is recalled but that may be something she can try in the future when it comes back on the market  She does want to quit.  Discussed ways of dealing with some of her stress other than smoking.  Referred to tobacco cessation program

## 2020-12-14 NOTE — Assessment & Plan Note (Addendum)
Chronic GERD not controlled Decrease coffee intake Continue omeprazole 20 mg  -  Will need to increase if not getting better

## 2020-12-14 NOTE — Assessment & Plan Note (Signed)
Chronic Controlled, stable Continue Strattera 100 mg daily

## 2020-12-14 NOTE — Assessment & Plan Note (Signed)
Chronic Check a1c Low sugar / carb diet Stressed regular exercise  

## 2020-12-14 NOTE — Assessment & Plan Note (Signed)
Chronic Check lipid panel Continue rosuvastatin 10 mg daily-most likely will need to increase this but will await blood work results Stressed smoking cessation, increased exercise and healthy diet

## 2020-12-14 NOTE — Assessment & Plan Note (Addendum)
Chronic Has DOE Stressed smoking cessation-Still smoking - she does want to quit-referred to smoking cessation program Use breztri daily - has not started Using albuterol inhaler as needed

## 2020-12-14 NOTE — Assessment & Plan Note (Signed)
Chronic BP well controlled Continue amlodipine 5 mg daily, hctz 12.5 mg qd, losartan 100 mg,  cmp

## 2020-12-14 NOTE — Assessment & Plan Note (Signed)
Chronic Continue Crestor 10 mg daily Check lipid panel Likely needs to increase to 20 mg daily Stressed smoking cessation Advised healthy diet and increased exercise

## 2020-12-14 NOTE — Assessment & Plan Note (Signed)
Chronic ?Stable ?CMP ?

## 2020-12-14 NOTE — Assessment & Plan Note (Signed)
Chronic Fairly controlled Continue fluoxetine 60 mg daily

## 2020-12-14 NOTE — Assessment & Plan Note (Signed)
New She states restless leg type symptoms at night and this is affecting her sleep Will try Requip 0.25 mg at bedtime

## 2020-12-14 NOTE — Assessment & Plan Note (Signed)
Chronic Not ideally controlled Trazodone does help so we will continue 50 mg at night along with her melatonin She will take the Flexeril at night for now given her flare of TMJ so we will hold the trazodone temporarily

## 2020-12-14 NOTE — Assessment & Plan Note (Signed)
Acute Started after dental work Being treated by her dentist She has not been taking the Flexeril regularly and stressed that she should start taking that at night-that most likely will help Advised to follow-up with her dentist if she is not seeing improvement

## 2020-12-29 DIAGNOSIS — Z20828 Contact with and (suspected) exposure to other viral communicable diseases: Secondary | ICD-10-CM | POA: Diagnosis not present

## 2020-12-29 DIAGNOSIS — Z20822 Contact with and (suspected) exposure to covid-19: Secondary | ICD-10-CM | POA: Diagnosis not present

## 2021-03-15 ENCOUNTER — Other Ambulatory Visit: Payer: Self-pay | Admitting: Internal Medicine

## 2021-03-22 ENCOUNTER — Other Ambulatory Visit: Payer: Self-pay | Admitting: Internal Medicine

## 2021-03-26 ENCOUNTER — Other Ambulatory Visit: Payer: Self-pay | Admitting: Internal Medicine

## 2021-04-16 NOTE — Progress Notes (Signed)
Subjective:    Patient ID: DNAJA SLEPPY, female    DOB: 02-01-1951, 70 y.o.   MRN: [id number]  HPI The patient is here for follow up of their chronic medical problems, including htn, hichol, prediabetes, ckd, copd, gerd, add, insomnia, rls  Right groin pain - it started a few days ago.  She feels it when she is walking.  She will monitor it.  Denies lifting anything heavy.  No hip issues in the past.     Medications and allergies reviewed with patient and updated if appropriate.  Patient Active Problem List   Diagnosis Date Noted   Right groin pain 04/17/2021   Tobacco abuse 12/14/2020   Restless leg syndrome 12/14/2020   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   Osteoporosis 05/21/2020   Aortic atherosclerosis ([place]) 05/09/2020   GERD (gastroesophageal reflux disease) 04/12/2020   Acute respiratory failure with hypoxia ([place]) 04/01/2019   Community acquired pneumonia 04/01/2019   Anemia 04/01/2019   S/P shoulder replacement, 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   Vertigo 06/05/2018   Status post total knee replacement, 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   CKD (chronic kidney disease) 12/27/2016   Insomnia 06/29/2016   Prediabetes 06/29/2016   LGSIL (low grade squamous intraepithelial dysplasia) 12/08/2015   COPD (chronic obstructive pulmonary disease) ([place]) 12/09/2014   Lumbago 07/16/2014   Bilateral hand pain 07/16/2014   HTN (hypertension) 06/04/2012   Hyperlipidemia 06/04/2012   Depression 06/04/2012   ADD (attention deficit disorder) 06/04/2012   AR (allergic rhinitis) 06/04/2012    Current Outpatient Medications on File Prior to Visit  Medication Sig Dispense Refill   acetic acid-hydrocortisone (VOSOL-HC) OTIC solution Place 3 drops into both ears 2 (two) times daily. 10 mL 0   albuterol (VENTOLIN HFA) 108 (90 Base) MCG/ACT inhaler Inhale 1-2 puffs into the lungs every 4 (four) hours as needed for wheezing or shortness of breath. 16 g 8   amLODipine (NORVASC) 5 MG tablet TAKE 1 TABLET BY MOUTH EVERY DAY 90  tablet 1   aspirin 81 MG chewable tablet Chew 162 mg by mouth daily.     atomoxetine (STRATTERA) 100 MG capsule TAKE 1 CAPSULE BY MOUTH EVERY DAY 90 capsule 2   Budeson-Glycopyrrol-Formoterol (BREZTRI AEROSPHERE) 160-9-4.8 MCG/ACT AERO Inhale 2 puffs into the lungs 2 (two) times daily. 10.7 g 5   FLUoxetine (PROZAC) 40 MG capsule TAKE 1 CAPSULE (40 MG TOTAL) BY MOUTH DAILY. IN ADDITION TO 20 MG FOR TOTAL OF 60 MG DAILY 90 capsule 0   fluticasone (FLONASE) 50 MCG/ACT nasal spray Place into both nostrils daily.     hydrochlorothiazide (HYDRODIURIL) 12.5 MG tablet TAKE 1 TABLET BY MOUTH EVERY DAY 90 tablet 1   losartan (COZAAR) 100 MG tablet TAKE 1 TABLET BY MOUTH EVERY DAY 90 tablet 1   meclizine (ANTIVERT) 25 MG tablet Take 1 tablet (25 mg total) by mouth 3 (three) times daily as needed for dizziness. 30 tablet 0   omeprazole (PRILOSEC) 20 MG capsule Take 1 capsule (20 mg total) by mouth daily. 90 capsule 1   polyethylene glycol powder (GLYCOLAX/MIRALAX) 17 GM/SCOOP powder polyethylene glycol 3350 17 gram oral powder packet  TAKE 17 G BY MOUTH 2 (TWO) TIMES DAILY.     rOPINIRole (REQUIP) 0.25 MG tablet Take 1 tablet (0.25 mg total) by mouth at bedtime. 30 tablet 5   traZODone (DESYREL) 50 MG tablet TAKE 1 TABLET BY MOUTH EVERYDAY AT BEDTIME 90 tablet 0   TURMERIC PO Take 1,000 mg by mouth daily.      vitamin B-12  100 MCG tablet Take 1 tablet (100 mcg total) by mouth daily. 30 tablet 0   ZINC PICOLINATE PO Take 50 mg by mouth daily.     No current facility-administered medications on file prior to visit.    Past Medical History:  Diagnosis Date   ADD (attention deficit disorder)    Allergy    Arthritis    CKD (chronic kidney disease), stage III ([facility])    COPD (chronic obstructive pulmonary disease) ([place]) 12/09/2014   Depression    Does use hearing aid    Dyspnea    on exertion   Hyperlipidemia    Hypertension    LGSIL (low grade squamous intraepithelial dysplasia)    Osteoporosis     Pre-diabetes     Past Surgical History:  Procedure Laterality Date   JOINT REPLACEMENT     right knee   MULTIPLE TOOTH EXTRACTIONS     REPLACEMENT TOTAL KNEE  2013   right   REVERSE SHOULDER ARTHROPLASTY Right 03/30/2019   Procedure: REVERSE TOTAL SHOULDER ARTHROPLASTY;  Surgeon: Netta Cedars, MD;  Location: [facility];  Service: Orthopedics;  Laterality: Right;  interscalene block   TOTAL KNEE ARTHROPLASTY Left 12/27/2017   TOTAL KNEE ARTHROPLASTY Left 12/27/2017   Procedure: LEFT TOTAL KNEE ARTHROPLASTY;  Surgeon: Netta Cedars, MD;  Location: [facility];  Service: Orthopedics;  Laterality: Left;    Social History   Socioeconomic History   Marital status: Divorced    Spouse name: Not on file   Number of children: 2   Years of education: Not on file   Highest education level: Not on file  Occupational History   Not on file  Tobacco Use   Smoking status: Every Day    Packs/day: 0.25    Years: 49.00    Pack years: 12.25    Types: Cigarettes   Smokeless tobacco: Never   Tobacco comments:    Trying quit smoking!, resumed    Vaping Use   Vaping Use: Former  Substance and Sexual Activity   Alcohol use: No    Alcohol/week: 0.0 standard drinks   Drug use: No   Sexual activity: Not Currently  Other Topics Concern   Not on file  Social History Narrative   Not on file   Social Determinants of Health   Financial Resource Strain: Low Risk    Difficulty of Paying Living Expenses: Not hard at all  Food Insecurity: No Food Insecurity   Worried About Charity fundraiser in the Last Year: Never true   [place] in the Last Year: Never true  Transportation Needs: No Transportation Needs   Lack of Transportation (Medical): No   Lack of Transportation (Non-Medical): No  Physical Activity: Sufficiently Active   Days of Exercise per Week: 7 days   Minutes of Exercise per Session: 30 min  Stress: No Stress Concern Present   Feeling of Stress : Not at all  Social Connections:  Moderately Integrated   Frequency of Communication with Friends and Family: More than three times a week   Frequency of Social Gatherings with Friends and Family: More than three times a week   Attends Religious Services: More than 4 times per year   Active Member of [employer] or Organizations: Yes   Attends Music therapist: More than 4 times per year   Marital Status: Never married    Family History  Problem Relation Age of Onset   Hyperlipidemia Mother    Alcohol abuse Father  Arthritis Father    Colon polyps Father    Alcohol abuse Maternal Uncle    Stroke Maternal Grandmother     Review of Systems  Constitutional:  Negative for chills and fever.  Respiratory:  Positive for cough and shortness of breath. Negative for wheezing.   Cardiovascular:  Negative for chest pain, palpitations and leg swelling.  Neurological:  Positive for dizziness (occ). Negative for light-headedness and headaches.      Objective:   Vitals:   04/17/21 1407  BP: 112/78  Pulse: 98  Temp: 98.5 F (36.9 C)  SpO2: 98%   BP Readings from Last 3 Encounters:  04/17/21 112/78  12/14/20 120/70  11/07/20 121/76   Wt Readings from Last 3 Encounters:  04/17/21 142 lb 3.2 oz (64.5 kg)  12/14/20 143 lb 9.6 oz (65.1 kg)  11/07/20 142 lb 3.2 oz (64.5 kg)   Body mass index is 26.87 kg/m.   Physical Exam    Constitutional: Appears well-developed and well-nourished. No distress.  HENT:  Head: Normocephalic and atraumatic.  Neck: Neck supple. No tracheal deviation present. No thyromegaly present.  No cervical lymphadenopathy Cardiovascular: Normal rate, regular rhythm and normal heart sounds.   No murmur heard. No carotid bruit .  No edema Pulmonary/Chest: Effort normal and breath sounds normal. No respiratory distress. No has no wheezes. No rales.  Skin: Skin is warm and dry. Not diaphoretic.  Psychiatric: Normal mood and affect. Behavior is normal.      Assessment & Plan:    See  Problem List for Assessment and Plan of chronic medical problems.    This visit occurred during the SARS-CoV-2 public health emergency.  Safety protocols were in place, including screening questions prior to the visit, additional usage of staff PPE, and extensive cleaning of exam room while observing appropriate contact time as indicated for disinfecting solutions.

## 2021-04-16 NOTE — Patient Instructions (Addendum)
  Blood work was ordered.     Medications changes include :   increase crestor to 20 mg daily.  Flexeril 5-10 mg three times a day as needed   Your prescription(s) have been submitted to your pharmacy. Please take as directed and contact our office if you believe you are having problem(s) with the medication(s).     Please followup in 6 months

## 2021-04-17 ENCOUNTER — Other Ambulatory Visit: Payer: Self-pay

## 2021-04-17 ENCOUNTER — Ambulatory Visit: Payer: Medicare PPO | Admitting: Internal Medicine

## 2021-04-17 ENCOUNTER — Encounter: Payer: Self-pay | Admitting: Internal Medicine

## 2021-04-17 VITALS — BP 112/78 | HR 98 | Temp 98.5°F | Ht 61.0 in | Wt 142.2 lb

## 2021-04-17 DIAGNOSIS — K219 Gastro-esophageal reflux disease without esophagitis: Secondary | ICD-10-CM

## 2021-04-17 DIAGNOSIS — J449 Chronic obstructive pulmonary disease, unspecified: Secondary | ICD-10-CM

## 2021-04-17 DIAGNOSIS — F3289 Other specified depressive episodes: Secondary | ICD-10-CM | POA: Diagnosis not present

## 2021-04-17 DIAGNOSIS — E782 Mixed hyperlipidemia: Secondary | ICD-10-CM

## 2021-04-17 DIAGNOSIS — I700 Atherosclerosis of aorta: Secondary | ICD-10-CM

## 2021-04-17 DIAGNOSIS — R7303 Prediabetes: Secondary | ICD-10-CM

## 2021-04-17 DIAGNOSIS — F988 Other specified behavioral and emotional disorders with onset usually occurring in childhood and adolescence: Secondary | ICD-10-CM | POA: Diagnosis not present

## 2021-04-17 DIAGNOSIS — I10 Essential (primary) hypertension: Secondary | ICD-10-CM | POA: Diagnosis not present

## 2021-04-17 DIAGNOSIS — G4709 Other insomnia: Secondary | ICD-10-CM

## 2021-04-17 DIAGNOSIS — N1832 Chronic kidney disease, stage 3b: Secondary | ICD-10-CM | POA: Diagnosis not present

## 2021-04-17 DIAGNOSIS — M26622 Arthralgia of left temporomandibular joint: Secondary | ICD-10-CM

## 2021-04-17 DIAGNOSIS — G2581 Restless legs syndrome: Secondary | ICD-10-CM

## 2021-04-17 DIAGNOSIS — R1031 Right lower quadrant pain: Secondary | ICD-10-CM

## 2021-04-17 MED ORDER — CYCLOBENZAPRINE HCL 10 MG PO TABS: 5.0000 mg | ORAL_TABLET | Freq: Three times a day (TID) | ORAL | 3 refills | Status: DC | PRN

## 2021-04-17 MED ORDER — ROSUVASTATIN CALCIUM 20 MG PO TABS: 20.0000 mg | ORAL_TABLET | Freq: Every day | ORAL | 3 refills | Status: DC

## 2021-04-17 MED ORDER — FLUOXETINE HCL 20 MG PO CAPS: ORAL_CAPSULE | ORAL | 1 refills | Status: DC

## 2021-04-17 NOTE — Assessment & Plan Note (Addendum)
Chronic BP well controlled Continue amlodipine 5 mg qd, hctz 12.5 mg qd, losartan 100 mg qd,  cmp

## 2021-04-17 NOTE — Assessment & Plan Note (Signed)
Chronic Not controlled Will go see a specialist Will retry flexeril - inc dose to 5-10 mg TID prn - may not tolerate 10 mg during the day Using mouth guard

## 2021-04-17 NOTE — Assessment & Plan Note (Addendum)
Chronic Reviewed with patient Will increase  rosuvastatin to 20 mg daily since LDL is not at goal Encouraged regular exercise, healthy diet

## 2021-04-17 NOTE — Assessment & Plan Note (Signed)
Chronic LDL has not been at goal given her aortic atherosclerosis Will increase Crestor to 20 mg daily Encourage regular exercise, healthy diet

## 2021-04-17 NOTE — Assessment & Plan Note (Signed)
Chronic GERD controlled Continue omeprazole 20 mg daily  

## 2021-04-17 NOTE — Assessment & Plan Note (Signed)
Chronic Fairly controlled Continue fluoxetine 60 mg daily

## 2021-04-17 NOTE — Assessment & Plan Note (Signed)
Chronic CMP, CBC 

## 2021-04-17 NOTE — Assessment & Plan Note (Signed)
Chronic Controlled, stable Continue trazodone 50 mg HS  

## 2021-04-17 NOTE — Assessment & Plan Note (Signed)
Chronic Improved Continue Requip 0.25 mg nightly

## 2021-04-17 NOTE — Assessment & Plan Note (Signed)
Chronic She is experiencing some shortness of breath and cough She is still smoking-stressed smoking cessation She is not using her inhaler on a daily basis and knows it would help to use it on a daily basis-encouraged better compliance

## 2021-04-17 NOTE — Assessment & Plan Note (Signed)
Chronic Controlled, stable Continue strattera 100 mg daily

## 2021-04-17 NOTE — Assessment & Plan Note (Signed)
Chronic Check a1c Low sugar / carb diet Stressed regular exercise  

## 2021-04-17 NOTE — Assessment & Plan Note (Signed)
Acute Started a few days ago Mostly when she walks - makes her need to stop walking and sit Deferred further evaluation since it just started She will let me know if it continues so I can refer her to a specialist

## 2021-04-18 LAB — COMPREHENSIVE METABOLIC PANEL
ALT: 34 U/L (ref 0–35)
AST: 28 U/L (ref 0–37)
Albumin: 3.9 g/dL (ref 3.5–5.2)
Alkaline Phosphatase: 153 U/L — ABNORMAL HIGH (ref 39–117)
BUN: 17 mg/dL (ref 6–23)
CO2: 27 mEq/L (ref 19–32)
Calcium: 9.5 mg/dL (ref 8.4–10.5)
Chloride: 100 mEq/L (ref 96–112)
Creatinine, Ser: 1.22 mg/dL — ABNORMAL HIGH (ref 0.40–1.20)
GFR: 44.9 mL/min — ABNORMAL LOW (ref >60.00)
Glucose, Bld: 116 mg/dL — ABNORMAL HIGH (ref 70–99)
Potassium: 3.7 mEq/L (ref 3.5–5.1)
Sodium: 136 mEq/L (ref 135–145)
Total Bilirubin: 0.4 mg/dL (ref 0.2–1.2)
Total Protein: 8.1 g/dL (ref 6.0–8.3)

## 2021-04-18 LAB — CBC WITH DIFFERENTIAL/PLATELET
Basophils Absolute: 0.1 10*3/uL (ref 0.0–0.1)
Basophils Relative: 1.2 % (ref 0.0–3.0)
Eosinophils Absolute: 0.7 10*3/uL (ref 0.0–0.7)
Eosinophils Relative: 10 % — ABNORMAL HIGH (ref 0.0–5.0)
HCT: 33.9 % — ABNORMAL LOW (ref 36.0–46.0)
Hemoglobin: 11 g/dL — ABNORMAL LOW (ref 12.0–15.0)
Lymphocytes Relative: 33.5 % (ref 12.0–46.0)
Lymphs Abs: 2.3 10*3/uL (ref 0.7–4.0)
MCHC: 32.3 g/dL (ref 30.0–36.0)
MCV: 89.4 fl (ref 78.0–100.0)
Monocytes Absolute: 0.6 10*3/uL (ref 0.1–1.0)
Monocytes Relative: 8.1 % (ref 3.0–12.0)
Neutro Abs: 3.3 10*3/uL (ref 1.4–7.7)
Neutrophils Relative %: 47.2 % (ref 43.0–77.0)
Platelets: 370 10*3/uL (ref 150.0–400.0)
RBC: 3.79 Mil/uL — ABNORMAL LOW (ref 3.87–5.11)
RDW: 14.1 % (ref 11.5–15.5)
WBC: 7 10*3/uL (ref 4.0–10.5)

## 2021-04-18 LAB — HEMOGLOBIN A1C: Hgb A1c MFr Bld: 6 % (ref 4.6–6.5)

## 2021-04-18 NOTE — Addendum Note (Signed)
Addended by: Boris Lown B on: 04/18/2021 01:00 PM   Modules accepted: Orders

## 2021-05-04 ENCOUNTER — Other Ambulatory Visit: Payer: Self-pay | Admitting: Internal Medicine

## 2021-05-04 DIAGNOSIS — E7800 Pure hypercholesterolemia, unspecified: Secondary | ICD-10-CM

## 2021-05-12 DIAGNOSIS — Z1231 Encounter for screening mammogram for malignant neoplasm of breast: Secondary | ICD-10-CM | POA: Diagnosis not present

## 2021-05-12 LAB — HM MAMMOGRAPHY

## 2021-05-29 NOTE — Progress Notes (Signed)
Virtual Visit via Video Note  I connected with Sheryl [name] on 05/29/21 at  7:50 AM EDT by a video enabled telemedicine application and verified that I am speaking with the correct person using two identifiers.   I discussed the limitations of evaluation and management by telemedicine and the availability of in person appointments. The patient expressed understanding and agreed to proceed.  Present for the visit:  Myself, Dr Billey Gosling, [name].  The patient is currently at home and I am in the office.    No referring provider.    History of Present Illness: She is here for an acute visit for cold symptoms.   Her symptoms started about 10 days.  She thinks she may have turned the corner yesterday when she called to make an appointment.  She is trying to feel little bit better.  She is experiencing chills, fatigue, nasal congestion, ear pain which she thinks is from her TMJ, sinus pain and sore throat, which is better.  She has a productive cough of clear sputum, shortness of breath that is chronic but slightly worse than usual and some wheezing.  She has also had dizziness, but not currently having that.  She has tried taking zyrtec.  She has the inhalers some.  She is not sure if it has helped.    An at home covid test was negative.     Review of Systems  Constitutional:  Positive for chills and malaise/fatigue. Negative for fever.  HENT:  Positive for congestion, ear pain (? tmj), sinus pain and sore throat.   Respiratory:  Positive for cough, sputum production (clear), shortness of breath (chronic, but slightly worse) and wheezing.   Gastrointestinal:  Negative for diarrhea and nausea.  Musculoskeletal:  Negative for myalgias.  Neurological:  Positive for dizziness. Negative for headaches.     Social History   Socioeconomic History   Marital status: Divorced    Spouse name: Not on file   Number of children: 2   Years of education: Not on file   Highest education  level: Not on file  Occupational History   Not on file  Tobacco Use   Smoking status: Every Day    Packs/day: 0.25    Years: 49.00    Pack years: 12.25    Types: Cigarettes   Smokeless tobacco: Never   Tobacco comments:    Trying quit smoking!, resumed    Vaping Use   Vaping Use: Former  Substance and Sexual Activity   Alcohol use: No    Alcohol/week: 0.0 standard drinks   Drug use: No   Sexual activity: Not Currently  Other Topics Concern   Not on file  Social History Narrative   Not on file   Social Determinants of Health   Financial Resource Strain: Not on file  Food Insecurity: Not on file  Transportation Needs: Not on file  Physical Activity: Not on file  Stress: Not on file  Social Connections: Not on file     Observations/Objective: Appears well in NAD Sounds congested Breathing normally and speaking full sentences  Assessment and Plan:  See Problem List for Assessment and Plan of chronic medical problems.   Follow Up Instructions:    I discussed the assessment and treatment plan with the patient. The patient was provided an opportunity to ask questions and all were answered. The patient agreed with the plan and demonstrated an understanding of the instructions.   The patient was advised to call back or seek  an in-person evaluation if the symptoms worsen or if the condition fails to improve as anticipated.    Binnie Rail, MD

## 2021-05-30 ENCOUNTER — Telehealth (INDEPENDENT_AMBULATORY_CARE_PROVIDER_SITE_OTHER): Payer: Medicare PPO | Admitting: Internal Medicine

## 2021-05-30 ENCOUNTER — Encounter: Payer: Self-pay | Admitting: Internal Medicine

## 2021-05-30 DIAGNOSIS — J441 Chronic obstructive pulmonary disease with (acute) exacerbation: Secondary | ICD-10-CM | POA: Diagnosis not present

## 2021-05-30 DIAGNOSIS — J069 Acute upper respiratory infection, unspecified: Secondary | ICD-10-CM | POA: Insufficient documentation

## 2021-05-30 DIAGNOSIS — J449 Chronic obstructive pulmonary disease, unspecified: Secondary | ICD-10-CM | POA: Diagnosis not present

## 2021-05-30 MED ORDER — BREZTRI AEROSPHERE 160-9-4.8 MCG/ACT IN AERO: 2.0000 | INHALATION_SPRAY | Freq: Two times a day (BID) | RESPIRATORY_TRACT | 5 refills | Status: DC

## 2021-05-30 MED ORDER — HYDROCODONE BIT-HOMATROP MBR 5-1.5 MG/5ML PO SOLN: 5.0000 mL | Freq: Three times a day (TID) | ORAL | 0 refills | Status: DC | PRN

## 2021-05-30 NOTE — Assessment & Plan Note (Signed)
Acute Symptoms suggestive of viral URI Home COVID test negative Could have been the flu or other virus Symptoms are improving so at this point no antiviral/antibiotic is needed or necessary Symptomatic treatment Continue Zyrtec Will prescribe Hycodan cough syrup 5 mL every 8 hours as needed Rest, fluids She will call if her symptoms do not continue to improve

## 2021-05-30 NOTE — Assessment & Plan Note (Signed)
Acute Viral URI with COPD exacerbation Advised using her inhalers regularly-refill for Breztri was sent to pharmacy Shortness of breath is worse than her baseline, also has some wheezing, but that has improved Symptoms are improving so I do not think we need to do any steroids at this time-just advised her to use those inhalers on a daily basis She did stop smoking with this illness-stressed of not restarting She is interested in seeing pulmonary-has struggled with shortness of breath chronically-referral ordered

## 2021-06-02 ENCOUNTER — Ambulatory Visit (HOSPITAL_COMMUNITY)
Admission: EM | Admit: 2021-06-02 | Discharge: 2021-06-02 | Disposition: A | Discharge status: Home / Self Care | Payer: Medicare PPO | Attending: Emergency Medicine | Admitting: Emergency Medicine

## 2021-06-02 ENCOUNTER — Ambulatory Visit (INDEPENDENT_AMBULATORY_CARE_PROVIDER_SITE_OTHER): Payer: Medicare PPO

## 2021-06-02 ENCOUNTER — Telehealth: Payer: Self-pay | Admitting: Internal Medicine

## 2021-06-02 ENCOUNTER — Other Ambulatory Visit: Payer: Self-pay

## 2021-06-02 ENCOUNTER — Encounter (HOSPITAL_COMMUNITY): Payer: Self-pay

## 2021-06-02 DIAGNOSIS — J209 Acute bronchitis, unspecified: Secondary | ICD-10-CM

## 2021-06-02 DIAGNOSIS — R0602 Shortness of breath: Secondary | ICD-10-CM | POA: Diagnosis not present

## 2021-06-02 DIAGNOSIS — R059 Cough, unspecified: Secondary | ICD-10-CM | POA: Diagnosis not present

## 2021-06-02 DIAGNOSIS — J441 Chronic obstructive pulmonary disease with (acute) exacerbation: Secondary | ICD-10-CM | POA: Diagnosis not present

## 2021-06-02 DIAGNOSIS — R5383 Other fatigue: Secondary | ICD-10-CM | POA: Diagnosis not present

## 2021-06-02 MED ORDER — AZITHROMYCIN 250 MG PO TABS: 250.0000 mg | ORAL_TABLET | Freq: Every day | ORAL | 0 refills | Status: DC

## 2021-06-02 MED ORDER — PREDNISONE 10 MG (21) PO TBPK: ORAL_TABLET | Freq: Every day | ORAL | 0 refills | Status: DC

## 2021-06-02 NOTE — ED Triage Notes (Signed)
Pt reports cough and fatigue x 2 weeks. Pt is concern for Pneumonia.   Reports negative home  COVID test 4 days ago.

## 2021-06-02 NOTE — Telephone Encounter (Signed)
Would recommend she go to urgent care for in person evaluation - they can do a chest xray and advise on additional treatment

## 2021-06-02 NOTE — Telephone Encounter (Signed)
Notified pt w/MD response.../lmb 

## 2021-06-02 NOTE — ED Provider Notes (Signed)
[place]    CSN: [id number] Arrival date & time: 06/02/21  1643      History   Chief Complaint Chief Complaint  Patient presents with   Cough   Fatigue    HPI Sheryl [name] is a 70 y.o. female.   Patient here for evaluation of cough and fatigue that has been ongoing for the past 2 weeks.  Reports having a video visit with her doctor on Monday but states that she was not given any medications at that time.  Does have history of COPD.  Reports at home COVID test 4 days ago was negative.  Denies any trauma, injury, or other precipitating event.  Denies any specific alleviating or aggravating factors.  Denies any fevers, chest pain, N/V/D, numbness, tingling, weakness, abdominal pain, or headaches.    The history is provided by the patient.  Cough Associated symptoms: shortness of breath    Past Medical History:  Diagnosis Date   ADD (attention deficit disorder)    Allergy    Arthritis    CKD (chronic kidney disease), stage III (HCC)    COPD (chronic obstructive pulmonary disease) ([place]) 12/09/2014   Depression    Does use hearing aid    Dyspnea    on exertion   Hyperlipidemia    Hypertension    LGSIL (low grade squamous intraepithelial dysplasia)    Osteoporosis    Pre-diabetes     Patient Active Problem List   Diagnosis Date Noted   URI (upper respiratory infection) 05/30/2021   Right groin pain 04/17/2021   Tobacco abuse 12/14/2020   Restless leg syndrome 12/14/2020   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   Osteoporosis 05/21/2020   Aortic atherosclerosis ([place]) 05/09/2020   GERD (gastroesophageal reflux disease) 04/12/2020   Acute respiratory failure with hypoxia ([place]) 04/01/2019   Community acquired pneumonia 04/01/2019   Anemia 04/01/2019   S/P shoulder replacement, 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   Vertigo 06/05/2018   Status post total knee replacement, 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   CKD (chronic kidney disease) 12/27/2016   Insomnia 06/29/2016   Prediabetes 06/29/2016    LGSIL (low grade squamous intraepithelial dysplasia) 12/08/2015   COPD (chronic obstructive pulmonary disease) ([place]) 12/09/2014   COPD exacerbation ([place]) 11/02/2014   Lumbago 07/16/2014   Bilateral hand pain 07/16/2014   HTN (hypertension) 06/04/2012   Hyperlipidemia 06/04/2012   Depression 06/04/2012   ADD (attention deficit disorder) 06/04/2012   AR (allergic rhinitis) 06/04/2012    Past Surgical History:  Procedure Laterality Date   JOINT REPLACEMENT     right knee   MULTIPLE TOOTH EXTRACTIONS     REPLACEMENT TOTAL KNEE  2013   right   REVERSE SHOULDER ARTHROPLASTY Right 03/30/2019   Procedure: REVERSE TOTAL SHOULDER ARTHROPLASTY;  Surgeon: Netta Cedars, MD;  Location: [facility];  Service: Orthopedics;  Laterality: Right;  interscalene block   TOTAL KNEE ARTHROPLASTY Left 12/27/2017   TOTAL KNEE ARTHROPLASTY Left 12/27/2017   Procedure: LEFT TOTAL KNEE ARTHROPLASTY;  Surgeon: Netta Cedars, MD;  Location: [facility] [facility];  Service: Orthopedics;  Laterality: Left;    OB History     Gravida  2   Para  2   Term      Preterm      AB      Living  2      SAB      IAB      Ectopic      Multiple      Live Births  Home Medications    Prior to Admission medications   Medication Sig Start Date End Date Taking? Authorizing Provider  azithromycin (ZITHROMAX) 250 MG tablet Take 1 tablet (250 mg total) by mouth daily. Take first 2 tablets together, then 1 every day until finished. 06/02/21  Yes Pearson Forster, NP  predniSONE (STERAPRED UNI-PAK 21 TAB) 10 MG (21) TBPK tablet Take by mouth daily. Take 6 tabs by mouth daily  for 2 days, then 5 tabs for 2 days, then 4 tabs for 2 days, then 3 tabs for 2 days, 2 tabs for 2 days, then 1 tab by mouth daily for 2 days 06/02/21  Yes Pearson Forster, NP  acetic acid-hydrocortisone (VOSOL-HC) OTIC solution Place 3 drops into both ears 2 (two) times daily. 11/07/20   Maximiano Coss, NP  albuterol (VENTOLIN HFA) 108 (90 Base)  MCG/ACT inhaler Inhale 1-2 puffs into the lungs every 4 (four) hours as needed for wheezing or shortness of breath. 05/10/20   Burns, Claudina Lick, MD  amLODipine (NORVASC) 5 MG tablet TAKE 1 TABLET BY MOUTH EVERY DAY 05/04/21   Binnie Rail, MD  aspirin 81 MG chewable tablet Chew 162 mg by mouth daily.    [provider]  atomoxetine (STRATTERA) 100 MG capsule TAKE 1 CAPSULE BY MOUTH EVERY DAY 10/24/20   Burns, Claudina Lick, MD  Budeson-Glycopyrrol-Formoterol (BREZTRI AEROSPHERE) 160-9-4.8 MCG/ACT AERO Inhale 2 puffs into the lungs 2 (two) times daily. 05/30/21   Binnie Rail, MD  cyclobenzaprine (FLEXERIL) 10 MG tablet Take 0.5-1 tablets (5-10 mg total) by mouth 3 (three) times daily as needed for muscle spasms. 04/17/21   Binnie Rail, MD  FLUoxetine (PROZAC) 20 MG capsule TAKE 1 CAPSULE (20 MG TOTAL) BY MOUTH DAILY IN ADDITION TO 40 MG FOR A TOTAL OF 60 MG. 04/17/21   Burns, Claudina Lick, MD  FLUoxetine (PROZAC) 40 MG capsule TAKE 1 CAPSULE (40 MG TOTAL) BY MOUTH DAILY. IN ADDITION TO 20 MG FOR TOTAL OF 60 MG DAILY 03/22/21   Binnie Rail, MD  fluticasone [facility]) 50 MCG/ACT nasal spray Place into both nostrils daily.    [provider]  hydrochlorothiazide (HYDRODIURIL) 12.5 MG tablet TAKE 1 TABLET BY MOUTH EVERY DAY 12/12/20   Binnie Rail, MD  HYDROcodone bit-homatropine (HYCODAN) 5-1.5 MG/5ML syrup Take 5 mLs by mouth every 8 (eight) hours as needed for cough. 05/30/21   Binnie Rail, MD  losartan (COZAAR) 100 MG tablet TAKE 1 TABLET BY MOUTH EVERY DAY 03/16/21   Binnie Rail, MD  meclizine (ANTIVERT) 25 MG tablet Take 1 tablet (25 mg total) by mouth 3 (three) times daily as needed for dizziness. 09/28/19   Binnie Rail, MD  omeprazole (PRILOSEC) 20 MG capsule Take 1 capsule (20 mg total) by mouth daily. 04/12/20   Binnie Rail, MD  polyethylene glycol powder (GLYCOLAX/MIRALAX) 17 GM/SCOOP powder polyethylene glycol 3350 17 gram oral powder packet  TAKE 17 G BY MOUTH 2 (TWO) TIMES  DAILY.    [provider]  rOPINIRole (REQUIP) 0.25 MG tablet Take 1 tablet (0.25 mg total) by mouth at bedtime. 12/14/20   Binnie Rail, MD  rosuvastatin (CRESTOR) 20 MG tablet Take 1 tablet (20 mg total) by mouth daily. 04/17/21   Binnie Rail, MD  traZODone (DESYREL) 50 MG tablet TAKE 1 TABLET BY MOUTH EVERYDAY AT BEDTIME 03/27/21   Binnie Rail, MD  TURMERIC PO Take 1,000 mg by mouth daily.  [provider]  vitamin B-12 100 MCG tablet Take 1 tablet (100 mcg total) by mouth daily. 04/05/19   Regalado, Belkys A, MD  ZINC PICOLINATE PO Take 50 mg by mouth daily.    [provider]    Family History Family History  Problem Relation Age of Onset   Hyperlipidemia Mother    Alcohol abuse Father    Arthritis Father    Colon polyps Father    Alcohol abuse Maternal Uncle    Stroke Maternal Grandmother     Social History Social History   Tobacco Use   Smoking status: Every Day    Packs/day: 0.25    Years: 49.00    Pack years: 12.25    Types: Cigarettes   Smokeless tobacco: Never   Tobacco comments:    Trying quit smoking!, resumed    Vaping Use   Vaping Use: Former  Substance Use Topics   Alcohol use: No    Alcohol/week: 0.0 standard drinks   Drug use: No     Allergies   Macrobid [nitrofurantoin monohyd macro]   Review of Systems Review of Systems  Respiratory:  Positive for cough and shortness of breath.   All other systems reviewed and are negative.   Physical Exam Triage Vital Signs ED Triage Vitals  Enc Vitals Group     BP 06/02/21 1750 (!) 145/79     Pulse Rate 06/02/21 1750 (!) 101     Resp 06/02/21 1750 18     Temp 06/02/21 1750 98.1 F (36.7 C)     Temp Source 06/02/21 1750 Oral     SpO2 06/02/21 1750 97 %     Weight --      Height --      Head Circumference --      Peak Flow --      Pain Score 06/02/21 1748 0     Pain Loc --      Pain Edu? --      Excl. in [place]? --    No data found.  Updated Vital Signs BP (!)  145/79 (BP Location: Left Arm)   Pulse (!) 101   Temp 98.1 F (36.7 C) (Oral)   Resp 18   SpO2 97%   Visual Acuity Right Eye Distance:   Left Eye Distance:   Bilateral Distance:    Right Eye Near:   Left Eye Near:    Bilateral Near:     Physical Exam Vitals and nursing note reviewed.  Constitutional:      General: She is not in acute distress.    Appearance: Normal appearance. She is not ill-appearing, toxic-appearing or diaphoretic.  HENT:     Head: Normocephalic and atraumatic.  Eyes:     Conjunctiva/sclera: Conjunctivae normal.  Cardiovascular:     Rate and Rhythm: Normal rate.     Pulses: Normal pulses.     Heart sounds: Normal heart sounds.  Pulmonary:     Effort: Pulmonary effort is normal. No tachypnea, bradypnea or accessory muscle usage.     Breath sounds: No decreased air movement or transmitted upper airway sounds. Examination of the right-upper field reveals decreased breath sounds. Examination of the left-upper field reveals decreased breath sounds. Decreased breath sounds present. No wheezing, rhonchi or rales.  Abdominal:     General: Abdomen is flat.  Musculoskeletal:        General: Normal range of motion.     Cervical back: Normal range of motion.  Skin:    General:  Skin is warm and dry.  Neurological:     General: No focal deficit present.     Mental Status: She is alert and oriented to person, place, and time.  Psychiatric:        Mood and Affect: Mood normal.     UC Treatments / Results  Labs (all labs ordered are listed, but only abnormal results are displayed) Labs Reviewed - No data to display  EKG   Radiology DG Chest 2 View  Result Date: 06/02/2021 CLINICAL DATA:  Two weeks of cough shortness of breath and fatigue. EXAM: CHEST - 2 VIEW COMPARISON:  Chest radiograph March 31, 2019 and October 31, 2017. FINDINGS: The heart size and mediastinal contours are within normal limits. Chronic bronchitic lung changes. Pulmonary hyperinflation.  No focal airspace consolidation. No pleural effusion. No pneumothorax. Partially visualized right shoulder arthroplasty. IMPRESSION: Chronic lung changes without evidence of acute cardiopulmonary disease. Electronically Signed   By: Dahlia Bailiff M.D.   On: 06/02/2021 19:00    Procedures Procedures (including critical care time)  Medications Ordered in UC Medications - No data to display  Initial Impression / Assessment and Plan / UC Course  I have reviewed the triage vital signs and the nursing notes.  Pertinent labs & imaging results that were available during my care of the patient were reviewed by me and considered in my medical decision making (see chart for details).    Assessment negative for red flags or concerns.  X-ray shows chronic lung changes without evidence of acute cardiopulmonary disease.  COPD exacerbation versus acute bronchitis.  Will treat with azithromycin and prednisone.  May take Tylenol and or ibuprofen as needed.  Encourage fluids and rest.  Follow-up with primary care provider for reevaluation of soon as possible. Final Clinical Impressions(s) / UC Diagnoses   Final diagnoses:  Acute bronchitis, unspecified organism  COPD exacerbation ([place])     Discharge Instructions      Take the prednisone daily as prescribed. Take the Zithromax 2 pills on the first day and then 1 pill every day until completed.  Take all of the pills.  Continue to use inhalers as prescribed.  You can take Tylenol and/or Ibuprofen as needed for pain relief and fever reduction.   Return or go to the Emergency Department if symptoms worsen or do not improve in the next few days.      ED Prescriptions     Medication Sig Dispense Auth. Provider   azithromycin (ZITHROMAX) 250 MG tablet Take 1 tablet (250 mg total) by mouth daily. Take first 2 tablets together, then 1 every day until finished. 6 tablet Pearson Forster, NP   predniSONE (STERAPRED UNI-PAK 21 TAB) 10 MG (21) TBPK tablet  Take by mouth daily. Take 6 tabs by mouth daily  for 2 days, then 5 tabs for 2 days, then 4 tabs for 2 days, then 3 tabs for 2 days, 2 tabs for 2 days, then 1 tab by mouth daily for 2 days 42 tablet Pearson Forster, NP      PDMP not reviewed this encounter.   Pearson Forster, NP 06/02/21 [phone]

## 2021-06-02 NOTE — Telephone Encounter (Signed)
Patient had VV visit w/ provider 10/11.. says she is still having cough along w/ feeling fatigue & tired  Please call patient [phone]

## 2021-06-02 NOTE — Discharge Instructions (Signed)
Take the prednisone daily as prescribed. Take the Zithromax 2 pills on the first day and then 1 pill every day until completed.  Take all of the pills.  Continue to use inhalers as prescribed.  You can take Tylenol and/or Ibuprofen as needed for pain relief and fever reduction.   Return or go to the Emergency Department if symptoms worsen or do not improve in the next few days.

## 2021-06-11 ENCOUNTER — Other Ambulatory Visit: Payer: Self-pay | Admitting: Internal Medicine

## 2021-06-21 ENCOUNTER — Other Ambulatory Visit: Payer: Self-pay | Admitting: Internal Medicine

## 2021-06-25 NOTE — Progress Notes (Signed)
Established Patient Office Visit  Subjective:  Patient ID: Sheryl [name], female    DOB: Feb 18, 1951  Age: 70 y.o. MRN: [id number]  CC:  Chief Complaint  Patient presents with   Otalgia    Patient states she has not been feeling well since March 4 she went to the dentist and she has been feeling down since. Per patient she has been having a ear ache and sore throat only on the left side.    HPI Sheryl [name] presents for ear pain  Anterior, both sides.  Ongoing since dentist visit on March 4 No sinus pressure, rhinorrhea, drainage from ears Worse with jaw movement No dental pain  No dysphagia  Past Medical History:  Diagnosis Date   ADD (attention deficit disorder)    Allergy    Arthritis    CKD (chronic kidney disease), stage III (HCC)    COPD (chronic obstructive pulmonary disease) ([facility]) 12/09/2014   Depression    Does use hearing aid    Dyspnea    on exertion   Hyperlipidemia    Hypertension    LGSIL (low grade squamous intraepithelial dysplasia)    Osteoporosis    Pre-diabetes     Past Surgical History:  Procedure Laterality Date   JOINT REPLACEMENT     right knee   MULTIPLE TOOTH EXTRACTIONS     REPLACEMENT TOTAL KNEE  2013   right   REVERSE SHOULDER ARTHROPLASTY Right 03/30/2019   Procedure: REVERSE TOTAL SHOULDER ARTHROPLASTY;  Surgeon: Netta Cedars, MD;  Location: [facility];  Service: Orthopedics;  Laterality: Right;  interscalene block   TOTAL KNEE ARTHROPLASTY Left 12/27/2017   TOTAL KNEE ARTHROPLASTY Left 12/27/2017   Procedure: LEFT TOTAL KNEE ARTHROPLASTY;  Surgeon: Netta Cedars, MD;  Location: [facility];  Service: Orthopedics;  Laterality: Left;    Family History  Problem Relation Age of Onset   Hyperlipidemia Mother    Alcohol abuse Father    Arthritis Father    Colon polyps Father    Alcohol abuse Maternal Uncle    Stroke Maternal Grandmother     Social History   Socioeconomic History   Marital status: Divorced    Spouse name: Not on  file   Number of children: 2   Years of education: Not on file   Highest education level: Not on file  Occupational History   Not on file  Tobacco Use   Smoking status: Every Day    Packs/day: 0.25    Years: 49.00    Pack years: 12.25    Types: Cigarettes   Smokeless tobacco: Never   Tobacco comments:    Trying quit smoking!, resumed    Vaping Use   Vaping Use: Former  Substance and Sexual Activity   Alcohol use: No    Alcohol/week: 0.0 standard drinks   Drug use: No   Sexual activity: Not Currently  Other Topics Concern   Not on file  Social History Narrative   Not on file   Social Determinants of Health   Financial Resource Strain: Not on file  Food Insecurity: Not on file  Transportation Needs: Not on file  Physical Activity: Not on file  Stress: Not on file  Social Connections: Not on file  Intimate Partner Violence: Not on file    Outpatient Medications Prior to Visit  Medication Sig Dispense Refill   albuterol (VENTOLIN HFA) 108 (90 Base) MCG/ACT inhaler Inhale 1-2 puffs into the lungs every 4 (four) hours as needed for wheezing or shortness  of breath. 16 g 8   aspirin 81 MG chewable tablet Chew 162 mg by mouth daily.     atomoxetine (STRATTERA) 100 MG capsule TAKE 1 CAPSULE BY MOUTH EVERY DAY 90 capsule 2   fluticasone (FLONASE) 50 MCG/ACT nasal spray Place into both nostrils daily.     meclizine (ANTIVERT) 25 MG tablet Take 1 tablet (25 mg total) by mouth 3 (three) times daily as needed for dizziness. 30 tablet 0   polyethylene glycol powder (GLYCOLAX/MIRALAX) 17 GM/SCOOP powder polyethylene glycol 3350 17 gram oral powder packet  TAKE 17 G BY MOUTH 2 (TWO) TIMES DAILY.     TURMERIC PO Take 1,000 mg by mouth daily.      vitamin B-12 100 MCG tablet Take 1 tablet (100 mcg total) by mouth daily. 30 tablet 0   ZINC PICOLINATE PO Take 50 mg by mouth daily.     amLODipine (NORVASC) 5 MG tablet TAKE 1 TABLET BY MOUTH EVERY DAY 90 tablet 1    Budeson-Glycopyrrol-Formoterol (BREZTRI AEROSPHERE) 160-9-4.8 MCG/ACT AERO Inhale 2 puffs into the lungs 2 (two) times daily. 10.7 g 5   FLUoxetine (PROZAC) 20 MG capsule TAKE 1 CAPSULE (20 MG TOTAL) BY MOUTH DAILY IN ADDITION TO 40 MG FOR A TOTAL OF 60 MG. 90 capsule 0   FLUoxetine (PROZAC) 40 MG capsule TAKE 1 CAPSULE (40 MG TOTAL) BY MOUTH DAILY. IN ADDITION TO 20 MG FOR TOTAL OF 60 MG DAILY 90 capsule 0   hydrochlorothiazide (HYDRODIURIL) 12.5 MG tablet TAKE 1 TABLET BY MOUTH EVERY DAY 90 tablet 1   losartan (COZAAR) 100 MG tablet TAKE 1 TABLET BY MOUTH EVERY DAY 90 tablet 1   omeprazole (PRILOSEC) 20 MG capsule Take 1 capsule (20 mg total) by mouth daily. 90 capsule 1   rosuvastatin (CRESTOR) 10 MG tablet TAKE 1 TABLET BY MOUTH EVERY DAY 90 tablet 2   traZODone (DESYREL) 50 MG tablet TAKE 1 TABLET BY MOUTH EVERYDAY AT BEDTIME 90 tablet 0   varenicline (CHANTIX STARTING MONTH PAK) 0.5 MG X 11 & 1 MG X 42 tablet TAKE AS DIRECTED ON PACKAGE 53 tablet 0   No facility-administered medications prior to visit.    Allergies  Allergen Reactions   Macrobid [Nitrofurantoin Monohyd Macro] Nausea And Vomiting    GI upset    ROS Review of Systems  Constitutional: Negative.   HENT:  Positive for ear pain.   Eyes: Negative.   Respiratory: Negative.    Cardiovascular: Negative.   Gastrointestinal: Negative.   Genitourinary: Negative.   Musculoskeletal: Negative.   Skin: Negative.   Neurological: Negative.   Psychiatric/Behavioral: Negative.    All other systems reviewed and are negative.    Objective:    Physical Exam Vitals and nursing note reviewed.  Constitutional:      General: She is not in acute distress.    Appearance: Normal appearance. She is normal weight. She is not ill-appearing, toxic-appearing or diaphoretic.  HENT:     Head: Normocephalic and atraumatic.     Right Ear: Tympanic membrane, ear canal and external ear normal. There is no impacted cerumen.     Left Ear:  Tympanic membrane, ear canal and external ear normal. There is no impacted cerumen.     Mouth/Throat:     Mouth: Mucous membranes are moist.     Pharynx: Oropharynx is clear. No oropharyngeal exudate.  Eyes:     Conjunctiva/sclera: Conjunctivae normal.     Pupils: Pupils are equal, round, and reactive to light.  Neck:     Vascular: No carotid bruit.  Cardiovascular:     Rate and Rhythm: Normal rate and regular rhythm.     Heart sounds: Normal heart sounds. No murmur heard.   No friction rub. No gallop.  Pulmonary:     Effort: Pulmonary effort is normal. No respiratory distress.     Breath sounds: Normal breath sounds. No stridor. No wheezing, rhonchi or rales.  Chest:     Chest wall: No tenderness.  Musculoskeletal:        General: Tenderness (tmj bilaterally) present. Normal range of motion.     Cervical back: Normal range of motion and neck supple. No rigidity or tenderness.  Lymphadenopathy:     Cervical: No cervical adenopathy.  Skin:    General: Skin is warm and dry.  Neurological:     General: No focal deficit present.     Mental Status: She is alert and oriented to person, place, and time. Mental status is at baseline.  Psychiatric:        Mood and Affect: Mood normal.        Behavior: Behavior normal.        Thought Content: Thought content normal.        Judgment: Judgment normal.    BP 121/76   Pulse 100   Temp 98.5 F (36.9 C) (Temporal)   Resp 18   Ht 5\' 1"  (1.549 m)   Wt 142 lb 3.2 oz (64.5 kg)   SpO2 98%   BMI 26.87 kg/m  Wt Readings from Last 3 Encounters:  04/17/21 142 lb 3.2 oz (64.5 kg)  12/14/20 143 lb 9.6 oz (65.1 kg)  11/07/20 142 lb 3.2 oz (64.5 kg)     Health Maintenance Due  Topic Date Due   MAMMOGRAM  02/19/2019   Zoster Vaccines- Shingrix (2 of 2) 05/07/2019   COVID-19 Vaccine (4 - Booster for Pfizer series) 10/04/2020    There are no preventive care reminders to display for this patient.  Lab Results  Component Value Date   TSH  2.18 04/12/2020   Lab Results  Component Value Date   WBC 7.0 04/18/2021   HGB 11.0 (L) 04/18/2021   HCT 33.9 (L) 04/18/2021   MCV 89.4 04/18/2021   PLT 370.0 04/18/2021   Lab Results  Component Value Date   NA 136 04/18/2021   K 3.7 04/18/2021   CO2 27 04/18/2021   GLUCOSE 116 (H) 04/18/2021   BUN 17 04/18/2021   CREATININE 1.22 (H) 04/18/2021   BILITOT 0.4 04/18/2021   ALKPHOS 153 (H) 04/18/2021   AST 28 04/18/2021   ALT 34 04/18/2021   PROT 8.1 04/18/2021   ALBUMIN 3.9 04/18/2021   CALCIUM 9.5 04/18/2021   ANIONGAP 12 04/02/2019   GFR 44.90 (L) 04/18/2021   Lab Results  Component Value Date   CHOL 158 12/14/2020   Lab Results  Component Value Date   HDL 50.00 12/14/2020   Lab Results  Component Value Date   LDLCALC 81 12/14/2020   Lab Results  Component Value Date   TRIG 134.0 12/14/2020   Lab Results  Component Value Date   CHOLHDL 3 12/14/2020   Lab Results  Component Value Date   HGBA1C 6.0 04/18/2021      Assessment & Plan:   Problem List Items Addressed This Visit   None Visit Diagnoses     Arthralgia of left temporomandibular joint    -  Primary   Acute otitis externa of both ears,  unspecified type       Relevant Medications   acetic acid-hydrocortisone (VOSOL-HC) OTIC solution       Meds ordered this encounter  Medications   DISCONTD: cyclobenzaprine (FLEXERIL) 5 MG tablet    Sig: Take 1 tablet (5 mg total) by mouth 2 (two) times daily as needed for muscle spasms.    Dispense:  30 tablet    Refill:  1    Order Specific Question:   Supervising Provider    Answer:   Carlota Raspberry, JEFFREY R [[id number]]   acetic acid-hydrocortisone (VOSOL-HC) OTIC solution    Sig: Place 3 drops into both ears 2 (two) times daily.    Dispense:  10 mL    Refill:  0    Order Specific Question:   Supervising Provider    Answer:   Carlota Raspberry, JEFFREY R [[id number]]    Follow-up: No follow-ups on file.   PLAN Exam suggests TMJ more than AOM. Question of otitis externa  on exam Treat each as above. Return precautions reviewed Patient encouraged to call clinic with any questions, comments, or concerns.  Maximiano Coss, NP

## 2021-06-26 ENCOUNTER — Encounter: Payer: Self-pay | Admitting: Internal Medicine

## 2021-06-26 NOTE — Progress Notes (Signed)
Outside notes received. Information abstracted. Notes sent to scan.  

## 2021-07-01 ENCOUNTER — Other Ambulatory Visit: Payer: Self-pay | Admitting: Internal Medicine

## 2021-07-02 ENCOUNTER — Other Ambulatory Visit: Payer: Self-pay | Admitting: Internal Medicine

## 2021-07-03 ENCOUNTER — Telehealth: Payer: Self-pay | Admitting: Internal Medicine

## 2021-07-03 NOTE — Telephone Encounter (Signed)
LVM for pt to rtn my call to schedule AWV with [facility]. Please schedule this appt if pt calls the office.  °

## 2021-07-25 ENCOUNTER — Ambulatory Visit: Payer: Medicare PPO

## 2021-08-06 ENCOUNTER — Other Ambulatory Visit: Payer: Self-pay | Admitting: Internal Medicine

## 2021-08-06 DIAGNOSIS — F988 Other specified behavioral and emotional disorders with onset usually occurring in childhood and adolescence: Secondary | ICD-10-CM

## 2021-09-09 ENCOUNTER — Other Ambulatory Visit: Payer: Self-pay | Admitting: Internal Medicine

## 2021-09-14 ENCOUNTER — Other Ambulatory Visit: Payer: Self-pay | Admitting: Internal Medicine

## 2021-09-26 ENCOUNTER — Other Ambulatory Visit: Payer: Self-pay

## 2021-09-26 ENCOUNTER — Inpatient Hospital Stay (HOSPITAL_COMMUNITY)
Admission: EM | Admit: 2021-09-26 | Discharge: 2021-10-02 | DRG: 065 | Disposition: A | Discharge status: Skilled Nursing Facility | Payer: Medicare PPO | Attending: Internal Medicine | Admitting: Internal Medicine

## 2021-09-26 ENCOUNTER — Emergency Department (HOSPITAL_COMMUNITY): Payer: Medicare PPO

## 2021-09-26 DIAGNOSIS — Z83438 Family history of other disorder of lipoprotein metabolism and other lipidemia: Secondary | ICD-10-CM

## 2021-09-26 DIAGNOSIS — R4182 Altered mental status, unspecified: Secondary | ICD-10-CM | POA: Diagnosis not present

## 2021-09-26 DIAGNOSIS — E785 Hyperlipidemia, unspecified: Secondary | ICD-10-CM | POA: Diagnosis not present

## 2021-09-26 DIAGNOSIS — Z974 Presence of external hearing-aid: Secondary | ICD-10-CM

## 2021-09-26 DIAGNOSIS — Z960 Presence of urogenital implants: Secondary | ICD-10-CM | POA: Diagnosis present

## 2021-09-26 DIAGNOSIS — H9190 Unspecified hearing loss, unspecified ear: Secondary | ICD-10-CM | POA: Diagnosis present

## 2021-09-26 DIAGNOSIS — R7303 Prediabetes: Secondary | ICD-10-CM | POA: Diagnosis not present

## 2021-09-26 DIAGNOSIS — M810 Age-related osteoporosis without current pathological fracture: Secondary | ICD-10-CM | POA: Diagnosis present

## 2021-09-26 DIAGNOSIS — Z7401 Bed confinement status: Secondary | ICD-10-CM | POA: Diagnosis not present

## 2021-09-26 DIAGNOSIS — F32A Depression, unspecified: Secondary | ICD-10-CM | POA: Diagnosis present

## 2021-09-26 DIAGNOSIS — Z720 Tobacco use: Secondary | ICD-10-CM

## 2021-09-26 DIAGNOSIS — J449 Chronic obstructive pulmonary disease, unspecified: Secondary | ICD-10-CM

## 2021-09-26 DIAGNOSIS — N1831 Chronic kidney disease, stage 3a: Secondary | ICD-10-CM | POA: Diagnosis not present

## 2021-09-26 DIAGNOSIS — R29708 NIHSS score 8: Secondary | ICD-10-CM | POA: Diagnosis present

## 2021-09-26 DIAGNOSIS — M1990 Unspecified osteoarthritis, unspecified site: Secondary | ICD-10-CM | POA: Diagnosis present

## 2021-09-26 DIAGNOSIS — M6282 Rhabdomyolysis: Secondary | ICD-10-CM | POA: Diagnosis present

## 2021-09-26 DIAGNOSIS — I639 Cerebral infarction, unspecified: Secondary | ICD-10-CM | POA: Diagnosis not present

## 2021-09-26 DIAGNOSIS — S199XXA Unspecified injury of neck, initial encounter: Secondary | ICD-10-CM | POA: Diagnosis not present

## 2021-09-26 DIAGNOSIS — J439 Emphysema, unspecified: Secondary | ICD-10-CM | POA: Diagnosis not present

## 2021-09-26 DIAGNOSIS — Y92019 Unspecified place in single-family (private) house as the place of occurrence of the external cause: Secondary | ICD-10-CM | POA: Diagnosis not present

## 2021-09-26 DIAGNOSIS — I129 Hypertensive chronic kidney disease with stage 1 through stage 4 chronic kidney disease, or unspecified chronic kidney disease: Secondary | ICD-10-CM | POA: Diagnosis present

## 2021-09-26 DIAGNOSIS — E162 Hypoglycemia, unspecified: Secondary | ICD-10-CM | POA: Diagnosis not present

## 2021-09-26 DIAGNOSIS — Z751 Person awaiting admission to adequate facility elsewhere: Secondary | ICD-10-CM

## 2021-09-26 DIAGNOSIS — F988 Other specified behavioral and emotional disorders with onset usually occurring in childhood and adolescence: Secondary | ICD-10-CM | POA: Diagnosis present

## 2021-09-26 DIAGNOSIS — D649 Anemia, unspecified: Secondary | ICD-10-CM | POA: Diagnosis not present

## 2021-09-26 DIAGNOSIS — I159 Secondary hypertension, unspecified: Secondary | ICD-10-CM | POA: Diagnosis not present

## 2021-09-26 DIAGNOSIS — R062 Wheezing: Secondary | ICD-10-CM | POA: Diagnosis not present

## 2021-09-26 DIAGNOSIS — S90811A Abrasion, right foot, initial encounter: Secondary | ICD-10-CM | POA: Diagnosis present

## 2021-09-26 DIAGNOSIS — Z823 Family history of stroke: Secondary | ICD-10-CM

## 2021-09-26 DIAGNOSIS — Z0189 Encounter for other specified special examinations: Secondary | ICD-10-CM

## 2021-09-26 DIAGNOSIS — R471 Dysarthria and anarthria: Secondary | ICD-10-CM | POA: Diagnosis present

## 2021-09-26 DIAGNOSIS — W19XXXA Unspecified fall, initial encounter: Secondary | ICD-10-CM | POA: Diagnosis present

## 2021-09-26 DIAGNOSIS — I517 Cardiomegaly: Secondary | ICD-10-CM | POA: Diagnosis not present

## 2021-09-26 DIAGNOSIS — R29810 Facial weakness: Secondary | ICD-10-CM | POA: Diagnosis not present

## 2021-09-26 DIAGNOSIS — I63233 Cerebral infarction due to unspecified occlusion or stenosis of bilateral carotid arteries: Secondary | ICD-10-CM | POA: Diagnosis not present

## 2021-09-26 DIAGNOSIS — R4781 Slurred speech: Secondary | ICD-10-CM | POA: Diagnosis not present

## 2021-09-26 DIAGNOSIS — I69352 Hemiplegia and hemiparesis following cerebral infarction affecting left dominant side: Secondary | ICD-10-CM | POA: Diagnosis not present

## 2021-09-26 DIAGNOSIS — G8101 Flaccid hemiplegia affecting right dominant side: Secondary | ICD-10-CM | POA: Diagnosis not present

## 2021-09-26 DIAGNOSIS — Z79899 Other long term (current) drug therapy: Secondary | ICD-10-CM

## 2021-09-26 DIAGNOSIS — E870 Hyperosmolality and hypernatremia: Secondary | ICD-10-CM | POA: Diagnosis not present

## 2021-09-26 DIAGNOSIS — Z8261 Family history of arthritis: Secondary | ICD-10-CM

## 2021-09-26 DIAGNOSIS — Z96611 Presence of right artificial shoulder joint: Secondary | ICD-10-CM | POA: Diagnosis present

## 2021-09-26 DIAGNOSIS — Z1389 Encounter for screening for other disorder: Secondary | ICD-10-CM

## 2021-09-26 DIAGNOSIS — F17210 Nicotine dependence, cigarettes, uncomplicated: Secondary | ICD-10-CM | POA: Diagnosis present

## 2021-09-26 DIAGNOSIS — T796XXA Traumatic ischemia of muscle, initial encounter: Secondary | ICD-10-CM

## 2021-09-26 DIAGNOSIS — E161 Other hypoglycemia: Secondary | ICD-10-CM | POA: Diagnosis not present

## 2021-09-26 DIAGNOSIS — E860 Dehydration: Secondary | ICD-10-CM | POA: Diagnosis not present

## 2021-09-26 DIAGNOSIS — I63512 Cerebral infarction due to unspecified occlusion or stenosis of left middle cerebral artery: Principal | ICD-10-CM

## 2021-09-26 DIAGNOSIS — R000 Tachycardia, unspecified: Secondary | ICD-10-CM | POA: Diagnosis not present

## 2021-09-26 DIAGNOSIS — I6602 Occlusion and stenosis of left middle cerebral artery: Secondary | ICD-10-CM | POA: Diagnosis not present

## 2021-09-26 DIAGNOSIS — M5030 Other cervical disc degeneration, unspecified cervical region: Secondary | ICD-10-CM | POA: Diagnosis not present

## 2021-09-26 DIAGNOSIS — Z811 Family history of alcohol abuse and dependence: Secondary | ICD-10-CM

## 2021-09-26 DIAGNOSIS — Z96653 Presence of artificial knee joint, bilateral: Secondary | ICD-10-CM | POA: Diagnosis present

## 2021-09-26 DIAGNOSIS — Z7951 Long term (current) use of inhaled steroids: Secondary | ICD-10-CM

## 2021-09-26 DIAGNOSIS — Q283 Other malformations of cerebral vessels: Secondary | ICD-10-CM | POA: Diagnosis not present

## 2021-09-26 DIAGNOSIS — F29 Unspecified psychosis not due to a substance or known physiological condition: Secondary | ICD-10-CM | POA: Diagnosis not present

## 2021-09-26 DIAGNOSIS — I10 Essential (primary) hypertension: Secondary | ICD-10-CM | POA: Diagnosis not present

## 2021-09-26 DIAGNOSIS — Z20822 Contact with and (suspected) exposure to covid-19: Secondary | ICD-10-CM | POA: Diagnosis not present

## 2021-09-26 DIAGNOSIS — I6522 Occlusion and stenosis of left carotid artery: Secondary | ICD-10-CM | POA: Diagnosis not present

## 2021-09-26 DIAGNOSIS — I700 Atherosclerosis of aorta: Secondary | ICD-10-CM | POA: Diagnosis not present

## 2021-09-26 DIAGNOSIS — I63232 Cerebral infarction due to unspecified occlusion or stenosis of left carotid arteries: Secondary | ICD-10-CM | POA: Diagnosis not present

## 2021-09-26 DIAGNOSIS — Z881 Allergy status to other antibiotic agents status: Secondary | ICD-10-CM

## 2021-09-26 DIAGNOSIS — G459 Transient cerebral ischemic attack, unspecified: Secondary | ICD-10-CM | POA: Diagnosis not present

## 2021-09-26 DIAGNOSIS — E782 Mixed hyperlipidemia: Secondary | ICD-10-CM | POA: Diagnosis not present

## 2021-09-26 DIAGNOSIS — Z01818 Encounter for other preprocedural examination: Secondary | ICD-10-CM | POA: Diagnosis not present

## 2021-09-26 DIAGNOSIS — I6612 Occlusion and stenosis of left anterior cerebral artery: Secondary | ICD-10-CM | POA: Diagnosis not present

## 2021-09-26 DIAGNOSIS — K219 Gastro-esophageal reflux disease without esophagitis: Secondary | ICD-10-CM | POA: Diagnosis present

## 2021-09-26 DIAGNOSIS — G2581 Restless legs syndrome: Secondary | ICD-10-CM | POA: Diagnosis not present

## 2021-09-26 DIAGNOSIS — Z8371 Family history of colonic polyps: Secondary | ICD-10-CM

## 2021-09-26 DIAGNOSIS — I6359 Cerebral infarction due to unspecified occlusion or stenosis of other cerebral artery: Secondary | ICD-10-CM | POA: Diagnosis not present

## 2021-09-26 LAB — CBC WITH DIFFERENTIAL/PLATELET
Abs Immature Granulocytes: 0.05 10*3/uL (ref 0.00–0.07)
Basophils Absolute: 0.1 10*3/uL (ref 0.0–0.1)
Basophils Relative: 0 %
Eosinophils Absolute: 0 10*3/uL (ref 0.0–0.5)
Eosinophils Relative: 0 %
HCT: 34.4 % — ABNORMAL LOW (ref 36.0–46.0)
Hemoglobin: 10.3 g/dL — ABNORMAL LOW (ref 12.0–15.0)
Immature Granulocytes: 0 %
Lymphocytes Relative: 9 %
Lymphs Abs: 1.4 10*3/uL (ref 0.7–4.0)
MCH: 27.5 pg (ref 26.0–34.0)
MCHC: 29.9 g/dL — ABNORMAL LOW (ref 30.0–36.0)
MCV: 91.7 fL (ref 80.0–100.0)
Monocytes Absolute: 1 10*3/uL (ref 0.1–1.0)
Monocytes Relative: 7 %
Neutro Abs: 12.3 10*3/uL — ABNORMAL HIGH (ref 1.7–7.7)
Neutrophils Relative %: 84 %
Platelets: 400 10*3/uL (ref 150–400)
RBC: 3.75 MIL/uL — ABNORMAL LOW (ref 3.87–5.11)
RDW: 14.8 % (ref 11.5–15.5)
WBC: 14.8 10*3/uL — ABNORMAL HIGH (ref 4.0–10.5)
nRBC: 0 % (ref 0.0–0.2)

## 2021-09-26 LAB — RESP PANEL BY RT-PCR (FLU A&B, COVID) ARPGX2
Influenza A by PCR: NEGATIVE
Influenza B by PCR: NEGATIVE
SARS Coronavirus 2 by RT PCR: NEGATIVE

## 2021-09-26 LAB — I-STAT CHEM 8, ED
BUN: 52 mg/dL — ABNORMAL HIGH (ref 8–23)
Calcium, Ion: 1.08 mmol/L — ABNORMAL LOW (ref 1.15–1.40)
Chloride: 113 mmol/L — ABNORMAL HIGH (ref 98–111)
Creatinine, Ser: 1.1 mg/dL — ABNORMAL HIGH (ref 0.44–1.00)
Glucose, Bld: 107 mg/dL — ABNORMAL HIGH (ref 70–99)
HCT: 32 % — ABNORMAL LOW (ref 36.0–46.0)
Hemoglobin: 10.9 g/dL — ABNORMAL LOW (ref 12.0–15.0)
Potassium: 6.1 mmol/L — ABNORMAL HIGH (ref 3.5–5.1)
Sodium: 141 mmol/L (ref 135–145)
TCO2: 25 mmol/L (ref 22–32)

## 2021-09-26 LAB — RAPID URINE DRUG SCREEN, HOSP PERFORMED
Amphetamines: NOT DETECTED
Barbiturates: NOT DETECTED
Benzodiazepines: NOT DETECTED
Cocaine: NOT DETECTED
Opiates: NOT DETECTED
Tetrahydrocannabinol: NOT DETECTED

## 2021-09-26 LAB — COMPREHENSIVE METABOLIC PANEL
ALT: 76 U/L — ABNORMAL HIGH (ref 0–44)
AST: 140 U/L — ABNORMAL HIGH (ref 15–41)
Albumin: 3.8 g/dL (ref 3.5–5.0)
Alkaline Phosphatase: 142 U/L — ABNORMAL HIGH (ref 38–126)
Anion gap: 18 — ABNORMAL HIGH (ref 5–15)
BUN: 32 mg/dL — ABNORMAL HIGH (ref 8–23)
CO2: 17 mmol/L — ABNORMAL LOW (ref 22–32)
Calcium: 9.8 mg/dL (ref 8.9–10.3)
Chloride: 108 mmol/L (ref 98–111)
Creatinine, Ser: 1.24 mg/dL — ABNORMAL HIGH (ref 0.44–1.00)
GFR, Estimated: 47 mL/min — ABNORMAL LOW (ref >60)
Glucose, Bld: 101 mg/dL — ABNORMAL HIGH (ref 70–99)
Potassium: 4 mmol/L (ref 3.5–5.1)
Sodium: 143 mmol/L (ref 135–145)
Total Bilirubin: 0.5 mg/dL (ref 0.3–1.2)
Total Protein: 8.3 g/dL — ABNORMAL HIGH (ref 6.5–8.1)

## 2021-09-26 LAB — URINALYSIS, ROUTINE W REFLEX MICROSCOPIC
Bilirubin Urine: NEGATIVE
Glucose, UA: NEGATIVE mg/dL
Hgb urine dipstick: NEGATIVE
Ketones, ur: 20 mg/dL — AB
Leukocytes,Ua: NEGATIVE
Nitrite: NEGATIVE
Protein, ur: NEGATIVE mg/dL
Specific Gravity, Urine: 1.031 — ABNORMAL HIGH (ref 1.005–1.030)
pH: 5 (ref 5.0–8.0)

## 2021-09-26 LAB — CK: Total CK: 1795 U/L — ABNORMAL HIGH (ref 38–234)

## 2021-09-26 LAB — ETHANOL: Alcohol, Ethyl (B): <10 mg/dL (ref <10)

## 2021-09-26 LAB — APTT: aPTT: 22 seconds — ABNORMAL LOW (ref 24–36)

## 2021-09-26 LAB — PROTIME-INR
INR: 1.1 (ref 0.8–1.2)
Prothrombin Time: 14.1 seconds (ref 11.4–15.2)

## 2021-09-26 MED ORDER — NICOTINE 14 MG/24HR TD PT24
14.0000 mg | MEDICATED_PATCH | Freq: Every day | TRANSDERMAL | Status: DC
  Administered 2021-09-26 – 2021-10-02 (×7): 14 mg via TRANSDERMAL
  Filled 2021-09-26 (×8): qty 1

## 2021-09-26 MED ORDER — ACETAMINOPHEN 160 MG/5ML PO SOLN: 650.0000 mg | ORAL | Status: DC | PRN

## 2021-09-26 MED ORDER — ACETAMINOPHEN 650 MG RE SUPP: 650.0000 mg | RECTAL | Status: DC | PRN

## 2021-09-26 MED ORDER — LACTATED RINGERS IV SOLN: INTRAVENOUS | Status: AC

## 2021-09-26 MED ORDER — IOHEXOL 350 MG/ML SOLN
65.0000 mL | Freq: Once | INTRAVENOUS | Status: AC | PRN
  Administered 2021-09-26: 65 mL via INTRAVENOUS

## 2021-09-26 MED ORDER — STROKE: EARLY STAGES OF RECOVERY BOOK
Freq: Once | Status: AC
  Filled 2021-09-26: qty 1

## 2021-09-26 MED ORDER — ACETAMINOPHEN 325 MG PO TABS
650.0000 mg | ORAL_TABLET | ORAL | Status: DC | PRN
  Administered 2021-10-01 (×2): 650 mg via ORAL
  Filled 2021-09-26 (×2): qty 2

## 2021-09-26 NOTE — Assessment & Plan Note (Addendum)
No wheezing heard on exam.

## 2021-09-26 NOTE — H&P (Signed)
History and Physical    Sheryl [name] HDQ:[id number] DOB: [birth year] DOA: 09/26/2021  DOS: the patient was seen and examined on 09/26/2021  PCP: Binnie Rail, MD   Patient coming from: Home  I have personally briefly reviewed patient's old medical records in [place]  CC: found on floor HPI: 71 year old female with a history of hyperlipidemia, COPD, prediabetes, CKD stage III, chronic tobacco abuse, ADD presents to the ER today after being found down on the floor.  Last known normal was about 48 hours ago.  Family talk to her on Sunday night.  No contact with her on Monday.  Patient would not answer the phone until today.  EMS was called.  Patient was found on the ground.  Brought to the ER.  Patient is left-hand dominant.  She has a dense right upper extremity parapsoas.  CT scan demonstrated a large infarct of the left corona radiata, left caudate nucleus, left internal capsule and left external capsule.  Local mass effect with effacement of the left lateral ventricle.  Patient outside the window for TNK due to last known normal being 48 hours ago.  Patient has dysarthria.  Labs notable for rhabdomyolysis with a CK of about 1700.  EKG showed sinus tachycardia.  EDP is consulted neurology.  [facility] hospitalist contacted for admission.   ED Course: CT head showed dense short left MCA distribution. EKG sinus tach.  Review of Systems:  Review of Systems  Unable to perform ROS: Other  stroke  Past Medical History:  Diagnosis Date   ADD (attention deficit disorder)    Allergy    Arthritis    CKD (chronic kidney disease), stage III ([place])    COPD (chronic obstructive pulmonary disease) ([place]) 12/09/2014   Depression    Does use hearing aid    Dyspnea    on exertion   Hyperlipidemia    Hypertension    LGSIL (low grade squamous intraepithelial dysplasia)    Osteoporosis    Pre-diabetes     Past Surgical History:  Procedure Laterality Date   JOINT REPLACEMENT      right knee   MULTIPLE TOOTH EXTRACTIONS     REPLACEMENT TOTAL KNEE  2013   right   REVERSE SHOULDER ARTHROPLASTY Right 03/30/2019   Procedure: REVERSE TOTAL SHOULDER ARTHROPLASTY;  Surgeon: Netta Cedars, MD;  Location: [facility];  Service: Orthopedics;  Laterality: Right;  interscalene block   TOTAL KNEE ARTHROPLASTY Left 12/27/2017   TOTAL KNEE ARTHROPLASTY Left 12/27/2017   Procedure: LEFT TOTAL KNEE ARTHROPLASTY;  Surgeon: Netta Cedars, MD;  Location: [facility];  Service: Orthopedics;  Laterality: Left;     reports that she has been smoking cigarettes. She has a 12.25 pack-year smoking history. She has never used smokeless tobacco. She reports that she does not drink alcohol and does not use drugs.  Allergies  Allergen Reactions   Macrobid [Nitrofurantoin Monohyd Macro] Nausea And Vomiting    GI upset    Family History  Problem Relation Age of Onset   Hyperlipidemia Mother    Alcohol abuse Father    Arthritis Father    Colon polyps Father    Alcohol abuse Maternal Uncle    Stroke Maternal Grandmother     Prior to Admission medications   Medication Sig Start Date End Date Taking? Authorizing Provider  albuterol (VENTOLIN HFA) 108 (90 Base) MCG/ACT inhaler Inhale 1-2 puffs into the lungs every 4 (four) hours as needed for wheezing or shortness of breath. 05/10/20  Yes Burns,  Claudina Lick, MD  amLODipine (NORVASC) 5 MG tablet TAKE 1 TABLET BY MOUTH EVERY DAY 05/04/21  Yes Burns, Claudina Lick, MD  atomoxetine (STRATTERA) 100 MG capsule TAKE 1 CAPSULE BY MOUTH EVERY DAY Patient taking differently: Take 100 mg by mouth daily. 08/07/21  Yes Burns, Claudina Lick, MD  Budeson-Glycopyrrol-Formoterol (BREZTRI AEROSPHERE) 160-9-4.8 MCG/ACT AERO Inhale 2 puffs into the lungs 2 (two) times daily. 05/30/21  Yes Burns, Claudina Lick, MD  FLUoxetine (PROZAC) 20 MG capsule TAKE 1 CAPSULE (20 MG TOTAL) BY MOUTH DAILY IN ADDITION TO 40 MG FOR A TOTAL OF 60 MG. 04/17/21  Yes Burns, Claudina Lick, MD  FLUoxetine (PROZAC) 40 MG capsule  TAKE 1 CAPSULE BY MOUTH DAILY. IN ADDITION TO 20 MG FOR TOTAL OF 60 MG DAILY 09/14/21  Yes Burns, Claudina Lick, MD  hydrochlorothiazide (HYDRODIURIL) 12.5 MG tablet TAKE 1 TABLET BY MOUTH EVERY DAY 07/03/21  Yes Burns, Claudina Lick, MD  losartan (COZAAR) 100 MG tablet TAKE 1 TABLET BY MOUTH EVERY DAY 09/11/21  Yes Burns, Claudina Lick, MD  meclizine (ANTIVERT) 25 MG tablet Take 1 tablet (25 mg total) by mouth 3 (three) times daily as needed for dizziness. 09/28/19  Yes Burns, Claudina Lick, MD  omeprazole (PRILOSEC) 20 MG capsule TAKE 1 CAPSULE BY MOUTH EVERY DAY 06/21/21  Yes Burns, Claudina Lick, MD  rOPINIRole (REQUIP) 0.25 MG tablet TAKE 1 TABLET BY MOUTH AT BEDTIME. 06/12/21  Yes Burns, Claudina Lick, MD  rosuvastatin (CRESTOR) 20 MG tablet Take 1 tablet (20 mg total) by mouth daily. 04/17/21  Yes Burns, Claudina Lick, MD  traZODone (DESYREL) 50 MG tablet TAKE 1 TABLET BY MOUTH EVERYDAY AT BEDTIME 09/14/21  Yes Burns, Claudina Lick, MD  acetic acid-hydrocortisone (VOSOL-HC) OTIC solution Place 3 drops into both ears 2 (two) times daily. 11/07/20   Maximiano Coss, NP  azithromycin (ZITHROMAX) 250 MG tablet Take 1 tablet (250 mg total) by mouth daily. Take first 2 tablets together, then 1 every day until finished. 06/02/21   Pearson Forster, NP  cyclobenzaprine (FLEXERIL) 10 MG tablet Take 0.5-1 tablets (5-10 mg total) by mouth 3 (three) times daily as needed for muscle spasms. 04/17/21   Binnie Rail, MD  fluticasone (FLONASE) 50 MCG/ACT nasal spray Place into both nostrils daily.    [provider]  HYDROcodone bit-homatropine (HYCODAN) 5-1.5 MG/5ML syrup Take 5 mLs by mouth every 8 (eight) hours as needed for cough. 05/30/21   Burns, Claudina Lick, MD  predniSONE (STERAPRED UNI-PAK 21 TAB) 10 MG (21) TBPK tablet Take by mouth daily. Take 6 tabs by mouth daily  for 2 days, then 5 tabs for 2 days, then 4 tabs for 2 days, then 3 tabs for 2 days, 2 tabs for 2 days, then 1 tab by mouth daily for 2 days 06/02/21   Pearson Forster, NP  TURMERIC PO  Take 1,000 mg by mouth daily.     [provider]  ZINC PICOLINATE PO Take 50 mg by mouth daily.    [provider]    Physical Exam: Vitals:   09/26/21 2030 09/26/21 2106 09/26/21 2145 09/26/21 2215  BP: 118/80 (!) 133/46 129/62 125/62  Pulse: 94  99 (!) 109  Resp: 19 18 18 16   Temp:      TempSrc:      SpO2: 100% 100% 100% 100%    Physical Exam Vitals and nursing note reviewed.  Constitutional:      General: She is not in acute distress.  Appearance: She is ill-appearing. She is not toxic-appearing or diaphoretic.  HENT:     Head: Normocephalic and atraumatic.     Nose: Nose normal. No rhinorrhea.  Neck:     Comments: In hard cervical collar. Cardiovascular:     Rate and Rhythm: Regular rhythm. Tachycardia present.     Pulses: Normal pulses.  Abdominal:     General: Bowel sounds are normal. There is no distension.     Tenderness: There is no abdominal tenderness. There is no guarding or rebound.  Skin:    Comments: Multiple abrasions on right foot  Neurological:     Mental Status: She is alert.     Comments: Dense flaccid paresis right UE/right hand  1/5 right hip flexor 1/5 right foot plantar flexion  4/5 left hand grip strength 4/5 left bicep strength  +dysarthria  Able to comprehend conversation and follow commands.      Labs on Admission: I have personally reviewed following labs and imaging studies  CBC: Recent Labs  Lab 09/26/21 2019 09/26/21 2030  WBC  --  14.8*  NEUTROABS  --  12.3*  HGB 10.9* 10.3*  HCT 32.0* 34.4*  MCV  --  91.7  PLT  --  720   Basic Metabolic Panel: Recent Labs  Lab 09/26/21 2019 09/26/21 2030  NA 141 143  K 6.1* 4.0  CL 113* 108  CO2  --  17*  GLUCOSE 107* 101*  BUN 52* 32*  CREATININE 1.10* 1.24*  CALCIUM  --  9.8   GFR: CrCl cannot be calculated (Unknown ideal weight.). Liver Function Tests: Recent Labs  Lab 09/26/21 2030  AST 140*  ALT 76*  ALKPHOS 142*  BILITOT 0.5  PROT 8.3*   ALBUMIN 3.8   No results for input(s): LIPASE, AMYLASE in the last 168 hours. No results for input(s): AMMONIA in the last 168 hours. Coagulation Profile: No results for input(s): INR, PROTIME in the last 168 hours. Cardiac Enzymes: Recent Labs  Lab 09/26/21 2030  CKTOTAL 1,795*   BNP (last 3 results) No results for input(s): PROBNP in the last 8760 hours. HbA1C: No results for input(s): HGBA1C in the last 72 hours. CBG: No results for input(s): GLUCAP in the last 168 hours. Lipid Profile: No results for input(s): CHOL, HDL, LDLCALC, TRIG, CHOLHDL, LDLDIRECT in the last 72 hours. Thyroid Function Tests: No results for input(s): TSH, T4TOTAL, FREET4, T3FREE, THYROIDAB in the last 72 hours. Anemia Panel: No results for input(s): VITAMINB12, FOLATE, FERRITIN, TIBC, IRON, RETICCTPCT in the last 72 hours. Urine analysis:    Component Value Date/Time   COLORURINE YELLOW 07/02/2017 [place] 07/02/2017 1016   LABSPEC 1.020 07/02/2017 1016   PHURINE 7.0 07/02/2017 1016   GLUCOSEU NEGATIVE 07/02/2017 1016   HGBUR NEGATIVE 07/02/2017 1016   BILIRUBINUR NEGATIVE 07/02/2017 1016   BILIRUBINUR negative 02/18/2017 1121   BILIRUBINUR neg 04/29/2015 1431   KETONESUR NEGATIVE 07/02/2017 1016   PROTEINUR negative 02/18/2017 1121   PROTEINUR NEGATIVE 03/05/2016 1559   UROBILINOGEN 0.2 07/02/2017 1016   NITRITE NEGATIVE 07/02/2017 1016   LEUKOCYTESUR NEGATIVE 07/02/2017 1016    Radiological Exams on Admission: I have personally reviewed images CT ANGIO HEAD NECK W WO CM  Result Date: 09/26/2021 CLINICAL DATA:  Provided history: Neuro deficit, acute, stroke suspected. Additional history provided: Patient from home after daughter found patient on ground, last known well Sunday night, stroke-like symptoms, right arm weakness, slurred speech. EXAM: CT ANGIOGRAPHY HEAD AND NECK TECHNIQUE: Multidetector CT imaging  of the head and neck was performed using the standard protocol  during bolus administration of intravenous contrast. Multiplanar CT image reconstructions and MIPs were obtained to evaluate the vascular anatomy. Carotid stenosis measurements (when applicable) are obtained utilizing NASCET criteria, using the distal internal carotid diameter as the denominator. RADIATION DOSE REDUCTION: This exam was performed according to the departmental dose-optimization program which includes automated exposure control, adjustment of the mA and/or kV according to patient size and/or use of iterative reconstruction technique. CONTRAST:  23mL OMNIPAQUE IOHEXOL 350 MG/ML SOLN COMPARISON:  None. FINDINGS: CT HEAD FINDINGS Brain: Cerebral volume appears normal for age. Acute or early subacute infarct measuring 4.8 x 2.7 x 3.7 cm within the left corona radiata, left caudate and lentiform nuclei, left internal capsule and left external capsule. Mild local mass effect with partial effacement of the left lateral ventricle. No midline shift. No evidence of hemorrhagic conversion. No demarcated cortical infarct. No extra-axial fluid collection. No evidence of an intracranial mass. No midline shift. Vascular: No hyperdense vessel.  Atherosclerotic calcifications. Skull: Normal. Negative for fracture or focal lesion. Sinuses: Visualized orbits show no acute finding. Orbits: No orbital mass or acute orbital finding. Review of the MIP images confirms the above findings CTA NECK FINDINGS Aortic arch: Standard aortic branching. Atherosclerotic plaque within the visualized aortic arch and proximal major branch vessels of the neck. Motion artifact limits evaluation of the right subclavian artery. Streak and beam hardening artifact arising from a dense left-sided contrast bolus limits evaluation of the left subclavian artery. Within this limitation, no definite hemodynamically significant stenosis is identified within the innominate or proximal subclavian arteries. Right carotid system: CCA and ICA patent within  the neck without significant stenosis (50% or greater). Mild atherosclerotic plaque about the carotid bifurcation and within the proximal ICA. Left carotid system: CCA and ICA patent within the neck. Soft calcified plaque about the carotid bifurcation and within the proximal ICA. Stenosis at the origin of the left ICA which is difficult to accurately quantify due to blooming artifact from calcified plaque, but appears greater than 80%. Distal to this, the left ICA is asymmetrically diminutive within the neck as compared to the right ICA. Vertebral arteries: Vertebral arteries patent within the neck without significant stenosis. Skeleton: Please refer to the separately reported CT of the cervical spine for cervical spine findings. There, no acute bony abnormality or aggressive osseous lesion is identified. Other neck: No neck mass or cervical lymphadenopathy. Subcentimeter calcified nodule within the left thyroid lobe not meeting consensus criteria for ultrasound follow-up based on size. Upper chest: No consolidation within the imaged lung apices. Centrilobular emphysema. Review of the MIP images confirms the above findings CTA HEAD FINDINGS Anterior circulation: The intracranial internal carotid arteries are patent. Calcified plaque within both vessels with no more than mild stenosis. The left M1 middle cerebral artery is occluded at its origin. There is some reconstitution of enhancement within the M2 and more distal left MCA vessels. Over, there is a severe stenosis versus focal occlusion within an inferior division proximal M2 left MCA vessel (series 14, image 25). The M1 right MCA is patent. No right M2 proximal branch occlusion or high-grade proximal stenosis. The anterior cerebral arteries are patent. No intracranial aneurysm is identified. Posterior circulation: The intracranial vertebral arteries are patent. The basilar artery is patent. The posterior cerebral arteries are patent. Predominantly fetal origin  of the right posterior cerebral artery. A sizable left posterior communicating artery is present. Venous sinuses: Within the limitations of contrast  timing, no convincing thrombus. Anatomic variants: As described. Review of the MIP images confirms the above findings These results were called by telephone at the time of interpretation on 09/26/2021 at 9:34 pm to provider Davonna Belling , who verbally acknowledged these results. CT head and CTA head impression #1 called by telephone at the time of interpretation on 09/26/2021 at 9:55 pm to provider Davonna Belling , who verbally acknowledged these results. IMPRESSION: CT head: Acute or early subacute infarct measuring 4.8 x 2.7 x 3.7 cm within the left corona radiata, left caudate and lentiform nuclei, left internal capsule and left external capsule. Local mass effect with partial effacement of the left lateral ventricle. No evidence of hemorrhagic conversion. CTA neck: 1. The left common and internal carotid arteries are patent within the neck. Prominent atherosclerotic plaque about the left carotid bifurcation and within the proximal ICA. Exact quantification of stenosis at the origin of the left ICA is difficult due to blooming artifact from calcified plaque at this site, but appears greater than 80%. Consider correlation with a carotid artery duplex ultrasound. 2. The right common and internal carotid arteries are patent within the neck without hemodynamically significant stenosis. Mild atherosclerotic plaque within the right carotid system within the neck, as described. 3. Vertebral arteries patent within the neck without significant stenosis. 4. Aortic Atherosclerosis (ICD10-I70.0) and Emphysema (ICD10-J43.9). CTA head: 1. 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 There is some reconstitution of enhancement within the M2 and more distal left MCA vessels. However, there is a superimposed severe stenosis versus focal occlusion within an inferior  division left M2 MCA vessel. 2. Calcified plaque within the intracranial internal carotid arteries with no more than mild stenosis. Electronically Signed   By: Kellie Simmering D.O.   On: 09/26/2021 21:56   CT C-SPINE NO CHARGE  Result Date: 09/26/2021 CLINICAL DATA:  Found on floor. EXAM: CT CERVICAL SPINE WITHOUT CONTRAST TECHNIQUE: Multidetector CT imaging of the cervical spine was performed without intravenous contrast. Multiplanar CT image reconstructions were also generated. RADIATION DOSE REDUCTION: This exam was performed according to the departmental dose-optimization program which includes automated exposure control, adjustment of the mA and/or kV according to patient size and/or use of iterative reconstruction technique. COMPARISON:  None. FINDINGS: Alignment: Normal Skull base and vertebrae: No acute fracture. No primary bone lesion or focal pathologic process. Soft tissues and spinal canal: No prevertebral fluid or swelling. No visible canal hematoma. Disc levels: Advanced diffuse degenerative facet disease bilaterally. Upper chest: Emphysema.  No acute abnormality. Other: None IMPRESSION: Advanced diffuse degenerative facet disease. No acute bony abnormality. Electronically Signed   By: Rolm Baptise M.D.   On: 09/26/2021 21:30   DG Chest Portable 1 View  Result Date: 09/26/2021 CLINICAL DATA:  Fall EXAM: PORTABLE CHEST 1 VIEW COMPARISON:  06/02/2021 FINDINGS: Right shoulder replacement. Borderline to mild cardiomegaly. No focal opacity or pleural effusion. No pneumothorax IMPRESSION: No active disease.  Borderline cardiomegaly Electronically Signed   By: Donavan Foil M.D.   On: 09/26/2021 20:57    EKG: I have personally reviewed EKG: sinus tachycardia   Assessment/Plan Principal Problem:   Acute ischemic left MCA stroke (HCC) Active Problems:   Rhabdomyolysis   Hyperlipidemia   ADD (attention deficit disorder)   COPD (chronic obstructive pulmonary disease) (HCC)   Prediabetes   Stage  3a chronic kidney disease (CKD) (HCC) - baseline SCr 1.1-1.3   Tobacco abuse    Assessment and Plan: * Acute ischemic left MCA stroke (  [place])- (present on admission) Admit to neuro progressive bed. EDP has consulted neurology. Will check echo, lipid panel. Rest of orders per neurology(specifically DVT prophylaxis, ASA vs plavix or both). Verified FULL code status with pt and her family at bedside.  Rhabdomyolysis Gentle IVF. Monitor urine output. Likely due to fall and being on floor for 24 hours. Repeat CK in AM.  Tobacco abuse- (present on admission) Add nicotine patch.  Stage 3a chronic kidney disease (CKD) (HCC) - baseline SCr 1.1-1.3- (present on admission) At baseline.  Prediabetes- (present on admission) Check A1c.  COPD (chronic obstructive pulmonary disease) ([place])- (present on admission) Not exacerbated.  ADD (attention deficit disorder)- (present on admission) stable  Hyperlipidemia- (present on admission) Check lipid panel. Needs swallow evaluation before po meds.   DVT prophylaxis: SCDs Code Status: Full Code Family Communication: discussed with pt, pt's dtr michelle and pt's sister jeanette Disposition Plan: SNF vs CIR  Consults called: EDP has consulted neurology  Admission status: Inpatient,  progressive   Kristopher Oppenheim, DO [facility] 09/26/2021, 10:25 PM

## 2021-09-26 NOTE — Assessment & Plan Note (Signed)
Check A1c. 

## 2021-09-26 NOTE — Assessment & Plan Note (Signed)
Add nicotine patch.

## 2021-09-26 NOTE — ED Provider Notes (Signed)
[facility] EMERGENCY DEPARTMENT Provider Note   CSN: [id number] Arrival date & time: 09/26/21  1932     History  Chief Complaint  Patient presents with   Altered Mental Status   AMS Stroke Like Symptoms    Sheryl [name] is a 71 y.o. female.   Altered Mental Status Presenting symptoms: no confusion   Associated symptoms: weakness   Associated symptoms: no abdominal pain   Patient presents with fall and right-sided weakness.  Reportedly last well Sunday night.  Somewhat difficult to get history on when she fell.  However has difficulty moving her right side at this time.  Mild neck pain.  Also right-sided facial droop.  No headache.  Not on blood thinners.   Past Medical History:  Diagnosis Date   ADD (attention deficit disorder)    Allergy    Arthritis    CKD (chronic kidney disease), stage III (HCC)    COPD (chronic obstructive pulmonary disease) ([place]) 12/09/2014   Depression    Does use hearing aid    Dyspnea    on exertion   Hyperlipidemia    Hypertension    LGSIL (low grade squamous intraepithelial dysplasia)    Osteoporosis    Pre-diabetes     Home Medications Prior to Admission medications   Medication Sig Start Date End Date Taking? Authorizing Provider  albuterol (VENTOLIN HFA) 108 (90 Base) MCG/ACT inhaler Inhale 1-2 puffs into the lungs every 4 (four) hours as needed for wheezing or shortness of breath. 05/10/20  Yes Burns, Claudina Lick, MD  amLODipine (NORVASC) 5 MG tablet TAKE 1 TABLET BY MOUTH EVERY DAY Patient taking differently: Take 5 mg by mouth daily. 05/04/21  Yes Burns, Claudina Lick, MD  atomoxetine (STRATTERA) 100 MG capsule TAKE 1 CAPSULE BY MOUTH EVERY DAY Patient taking differently: Take 100 mg by mouth daily. 08/07/21  Yes Burns, Claudina Lick, MD  Budeson-Glycopyrrol-Formoterol (BREZTRI AEROSPHERE) 160-9-4.8 MCG/ACT AERO Inhale 2 puffs into the lungs 2 (two) times daily. 05/30/21  Yes Burns, Claudina Lick, MD  cyclobenzaprine (FLEXERIL) 10 MG  tablet Take 0.5-1 tablets (5-10 mg total) by mouth 3 (three) times daily as needed for muscle spasms. 04/17/21  Yes Burns, Claudina Lick, MD  FLUoxetine (PROZAC) 20 MG capsule TAKE 1 CAPSULE (20 MG TOTAL) BY MOUTH DAILY IN ADDITION TO 40 MG FOR A TOTAL OF 60 MG. Patient taking differently: Take 20 mg by mouth daily. Along with 40 mg to equal 60 mg 04/17/21  Yes Burns, Claudina Lick, MD  FLUoxetine (PROZAC) 40 MG capsule TAKE 1 CAPSULE BY MOUTH DAILY. IN ADDITION TO 20 MG FOR TOTAL OF 60 MG DAILY Patient taking differently: Take 40 mg by mouth daily. Along with 20 mg to equal 60 mg 09/14/21  Yes Burns, Claudina Lick, MD  fluticasone [facility]) 50 MCG/ACT nasal spray Place 1 spray into both nostrils daily as needed for allergies.   Yes [provider]  hydrochlorothiazide (HYDRODIURIL) 12.5 MG tablet TAKE 1 TABLET BY MOUTH EVERY DAY Patient taking differently: Take 12.5 mg by mouth daily. 07/03/21  Yes Burns, Claudina Lick, MD  losartan (COZAAR) 100 MG tablet TAKE 1 TABLET BY MOUTH EVERY DAY Patient taking differently: Take 100 mg by mouth daily. 09/11/21  Yes Burns, Claudina Lick, MD  meclizine (ANTIVERT) 25 MG tablet Take 1 tablet (25 mg total) by mouth 3 (three) times daily as needed for dizziness. 09/28/19  Yes Burns, Claudina Lick, MD  omeprazole (PRILOSEC) 20 MG capsule TAKE 1 CAPSULE BY MOUTH EVERY  DAY Patient taking differently: Take 20 mg by mouth daily. 06/21/21  Yes Burns, Claudina Lick, MD  rOPINIRole (REQUIP) 0.25 MG tablet TAKE 1 TABLET BY MOUTH AT BEDTIME. Patient taking differently: Take 0.25 mg by mouth at bedtime. 06/12/21  Yes Burns, Claudina Lick, MD  rosuvastatin (CRESTOR) 20 MG tablet Take 1 tablet (20 mg total) by mouth daily. 04/17/21  Yes Burns, Claudina Lick, MD  traZODone (DESYREL) 50 MG tablet TAKE 1 TABLET BY MOUTH EVERYDAY AT BEDTIME Patient taking differently: Take 50 mg by mouth at bedtime. 09/14/21  Yes Burns, Claudina Lick, MD      Allergies    Macrobid [nitrofurantoin monohyd macro]    Review of Systems   Review of  Systems  Constitutional:  Negative for appetite change.  Respiratory:  Negative for shortness of breath.   Gastrointestinal:  Negative for abdominal pain.  Musculoskeletal:  Positive for neck pain. Negative for back pain.  Neurological:  Positive for weakness.  Psychiatric/Behavioral:  Negative for confusion.    Physical Exam Updated Vital Signs BP 125/62    Pulse (!) 109    Temp 98.4 F (36.9 C) (Oral)    Resp 16    SpO2 100%  Physical Exam Vitals reviewed.  HENT:     Head:     Comments: Ecchymosis to mid forehead. Eyes:     Extraocular Movements: Extraocular movements intact.  Neck:     Comments: Mild midline tenderness. Skin:    Capillary Refill: Capillary refill takes less than 2 seconds.     Comments: Pressure wound to right foot.  Neurological:     Mental Status: She is alert.     Comments: Right-sided facial droop.  Weaker on right compared to left.  Minimal lifting of right arm.  Decreased strength right lower extremity.  Slurred speech and may have some mild confusion but overall able to answer most questions    ED Results / Procedures / Treatments   Labs (all labs ordered are listed, but only abnormal results are displayed) Labs Reviewed  URINALYSIS, ROUTINE W REFLEX MICROSCOPIC - Abnormal; Notable for the following components:      Result Value   Specific Gravity, Urine 1.031 (*)    Ketones, ur 20 (*)    All other components within normal limits  CK - Abnormal; Notable for the following components:   Total CK 1,795 (*)    All other components within normal limits  COMPREHENSIVE METABOLIC PANEL - Abnormal; Notable for the following components:   CO2 17 (*)    Glucose, Bld 101 (*)    BUN 32 (*)    Creatinine, Ser 1.24 (*)    Total Protein 8.3 (*)    AST 140 (*)    ALT 76 (*)    Alkaline Phosphatase 142 (*)    GFR, Estimated 47 (*)    Anion gap 18 (*)    All other components within normal limits  CBC WITH DIFFERENTIAL/PLATELET - Abnormal; Notable for the  following components:   WBC 14.8 (*)    RBC 3.75 (*)    Hemoglobin 10.3 (*)    HCT 34.4 (*)    MCHC 29.9 (*)    Neutro Abs 12.3 (*)    All other components within normal limits  APTT - Abnormal; Notable for the following components:   aPTT 22 (*)    All other components within normal limits  I-STAT CHEM 8, ED - Abnormal; Notable for the following components:   Potassium 6.1 (*)  Chloride 113 (*)    BUN 52 (*)    Creatinine, Ser 1.10 (*)    Glucose, Bld 107 (*)    Calcium, Ion 1.08 (*)    Hemoglobin 10.9 (*)    HCT 32.0 (*)    All other components within normal limits  RESP PANEL BY RT-PCR (FLU A&B, COVID) ARPGX2  ETHANOL  PROTIME-INR  RAPID URINE DRUG SCREEN, HOSP PERFORMED    EKG EKG Interpretation  Date/Time:  Tuesday September 26 2021 19:46:26 EST Ventricular Rate:  104 PR Interval:  177 QRS Duration: 84 QT Interval:  372 QTC Calculation: 490 R Axis:   -66 Text Interpretation: Sinus tachycardia Right ventricular hypertrophy Inferior infarct, old Confirmed by Davonna Belling [phone]) on 09/26/2021 8:07:19 PM  Radiology CT ANGIO HEAD NECK W WO CM  Result Date: 09/26/2021 CLINICAL DATA:  Provided history: Neuro deficit, acute, stroke suspected. Additional history provided: Patient from home after daughter found patient on ground, last known well Sunday night, stroke-like symptoms, right arm weakness, slurred speech. EXAM: CT ANGIOGRAPHY HEAD AND NECK TECHNIQUE: Multidetector CT imaging of the head and neck was performed using the standard protocol during bolus administration of intravenous contrast. Multiplanar CT image reconstructions and MIPs were obtained to evaluate the vascular anatomy. Carotid stenosis measurements (when applicable) are obtained utilizing NASCET criteria, using the distal internal carotid diameter as the denominator. RADIATION DOSE REDUCTION: This exam was performed according to the departmental dose-optimization program which includes automated  exposure control, adjustment of the mA and/or kV according to patient size and/or use of iterative reconstruction technique. CONTRAST:  34mL OMNIPAQUE IOHEXOL 350 MG/ML SOLN COMPARISON:  None. FINDINGS: CT HEAD FINDINGS Brain: Cerebral volume appears normal for age. Acute or early subacute infarct measuring 4.8 x 2.7 x 3.7 cm within the left corona radiata, left caudate and lentiform nuclei, left internal capsule and left external capsule. Mild local mass effect with partial effacement of the left lateral ventricle. No midline shift. No evidence of hemorrhagic conversion. No demarcated cortical infarct. No extra-axial fluid collection. No evidence of an intracranial mass. No midline shift. Vascular: No hyperdense vessel.  Atherosclerotic calcifications. Skull: Normal. Negative for fracture or focal lesion. Sinuses: Visualized orbits show no acute finding. Orbits: No orbital mass or acute orbital finding. Review of the MIP images confirms the above findings CTA NECK FINDINGS Aortic arch: Standard aortic branching. Atherosclerotic plaque within the visualized aortic arch and proximal major branch vessels of the neck. Motion artifact limits evaluation of the right subclavian artery. Streak and beam hardening artifact arising from a dense left-sided contrast bolus limits evaluation of the left subclavian artery. Within this limitation, no definite hemodynamically significant stenosis is identified within the innominate or proximal subclavian arteries. Right carotid system: CCA and ICA patent within the neck without significant stenosis (50% or greater). Mild atherosclerotic plaque about the carotid bifurcation and within the proximal ICA. Left carotid system: CCA and ICA patent within the neck. Soft calcified plaque about the carotid bifurcation and within the proximal ICA. Stenosis at the origin of the left ICA which is difficult to accurately quantify due to blooming artifact from calcified plaque, but appears greater  than 80%. Distal to this, the left ICA is asymmetrically diminutive within the neck as compared to the right ICA. Vertebral arteries: Vertebral arteries patent within the neck without significant stenosis. Skeleton: Please refer to the separately reported CT of the cervical spine for cervical spine findings. There, no acute bony abnormality or aggressive osseous lesion is identified. Other neck: No  neck mass or cervical lymphadenopathy. Subcentimeter calcified nodule within the left thyroid lobe not meeting consensus criteria for ultrasound follow-up based on size. Upper chest: No consolidation within the imaged lung apices. Centrilobular emphysema. Review of the MIP images confirms the above findings CTA HEAD FINDINGS Anterior circulation: The intracranial internal carotid arteries are patent. Calcified plaque within both vessels with no more than mild stenosis. The left M1 middle cerebral artery is occluded at its origin. There is some reconstitution of enhancement within the M2 and more distal left MCA vessels. Over, there is a severe stenosis versus focal occlusion within an inferior division proximal M2 left MCA vessel (series 14, image 25). The M1 right MCA is patent. No right M2 proximal branch occlusion or high-grade proximal stenosis. The anterior cerebral arteries are patent. No intracranial aneurysm is identified. Posterior circulation: The intracranial vertebral arteries are patent. The basilar artery is patent. The posterior cerebral arteries are patent. Predominantly fetal origin of the right posterior cerebral artery. A sizable left posterior communicating artery is present. Venous sinuses: Within the limitations of contrast timing, no convincing thrombus. Anatomic variants: As described. Review of the MIP images confirms the above findings These results were called by telephone at the time of interpretation on 09/26/2021 at 9:34 pm to provider Davonna Belling , who verbally acknowledged these results.  CT head and CTA head impression #1 called by telephone at the time of interpretation on 09/26/2021 at 9:55 pm to provider Davonna Belling , who verbally acknowledged these results. IMPRESSION: CT head: Acute or early subacute infarct measuring 4.8 x 2.7 x 3.7 cm within the left corona radiata, left caudate and lentiform nuclei, left internal capsule and left external capsule. Local mass effect with partial effacement of the left lateral ventricle. No evidence of hemorrhagic conversion. CTA neck: 1. The left common and internal carotid arteries are patent within the neck. Prominent atherosclerotic plaque about the left carotid bifurcation and within the proximal ICA. Exact quantification of stenosis at the origin of the left ICA is difficult due to blooming artifact from calcified plaque at this site, but appears greater than 80%. Consider correlation with a carotid artery duplex ultrasound. 2. The right common and internal carotid arteries are patent within the neck without hemodynamically significant stenosis. Mild atherosclerotic plaque within the right carotid system within the neck, as described. 3. Vertebral arteries patent within the neck without significant stenosis. 4. Aortic Atherosclerosis (ICD10-I70.0) and Emphysema (ICD10-J43.9). CTA head: 1. 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 There is some reconstitution of enhancement within the M2 and more distal left MCA vessels. However, there is a superimposed severe stenosis versus focal occlusion within an inferior division left M2 MCA vessel. 2. Calcified plaque within the intracranial internal carotid arteries with no more than mild stenosis. Electronically Signed   By: Kellie Simmering D.O.   On: 09/26/2021 21:56   CT C-SPINE NO CHARGE  Result Date: 09/26/2021 CLINICAL DATA:  Found on floor. EXAM: CT CERVICAL SPINE WITHOUT CONTRAST TECHNIQUE: Multidetector CT imaging of the cervical spine was performed without intravenous contrast.  Multiplanar CT image reconstructions were also generated. RADIATION DOSE REDUCTION: This exam was performed according to the departmental dose-optimization program which includes automated exposure control, adjustment of the mA and/or kV according to patient size and/or use of iterative reconstruction technique. COMPARISON:  None. FINDINGS: Alignment: Normal Skull base and vertebrae: No acute fracture. No primary bone lesion or focal pathologic process. Soft tissues and spinal canal: No prevertebral fluid or  swelling. No visible canal hematoma. Disc levels: Advanced diffuse degenerative facet disease bilaterally. Upper chest: Emphysema.  No acute abnormality. Other: None IMPRESSION: Advanced diffuse degenerative facet disease. No acute bony abnormality. Electronically Signed   By: Rolm Baptise M.D.   On: 09/26/2021 21:30   DG Chest Portable 1 View  Result Date: 09/26/2021 CLINICAL DATA:  Fall EXAM: PORTABLE CHEST 1 VIEW COMPARISON:  06/02/2021 FINDINGS: Right shoulder replacement. Borderline to mild cardiomegaly. No focal opacity or pleural effusion. No pneumothorax IMPRESSION: No active disease.  Borderline cardiomegaly Electronically Signed   By: Donavan Foil M.D.   On: 09/26/2021 20:57    Procedures Procedures    Medications Ordered in ED Medications  lactated ringers infusion ( Intravenous New Bag/Given 09/26/21 2251)  iohexol (OMNIPAQUE) 350 MG/ML injection 65 mL (65 mLs Intravenous Contrast Given 09/26/21 2125)    ED Course/ Medical Decision Making/ A&P                           Medical Decision Making Problems Addressed: Cerebrovascular accident (CVA), unspecified mechanism ([place]): acute illness or injury that poses a threat to life or bodily functions  Amount and/or Complexity of Data Reviewed Labs: ordered. Radiology: ordered.  Risk Prescription drug management. Decision regarding hospitalization.   Patient presents after fall and weakness.  Initial differential diagnosis includes  many causes of fall.  Also the weakness could be cervical spine injury versus stroke. Patient presented after a fall and right-sided weakness.  Last seen normal on Sunday.  Unsure exactly of when the event happened.  Discussed with daughter who is here.  Cannot eliminate yesterday as the cause.  Potentially had some today but it appears to have a pressure wound on the right foot that looks like it has been there longer than this afternoon.  CK elevated.  I independently interpreted the head CT and does show a left-sided stroke.  Also discussed with Dr. Armandina Gemma from neuroradiology.  Has left-sided M1 and also further down M2 occlusion.  Not a tPA candidate due to last normal of at the earliest likely more than 24 hours ago.  Will discuss with neurology, Dr. Cheral Marker.  Will admit to hospitalist.  CRITICAL CARE Performed by: Davonna Belling Total critical care time: 30 minutes Critical care time was exclusive of separately billable procedures and treating other patients. Critical care was necessary to treat or prevent imminent or life-threatening deterioration. Critical care was time spent personally by me on the following activities: development of treatment plan with patient and/or surrogate as well as nursing, discussions with consultants, evaluation of patient's response to treatment, examination of patient, obtaining history from patient or surrogate, ordering and performing treatments and interventions, ordering and review of laboratory studies, ordering and review of radiographic studies, pulse oximetry and re-evaluation of patient's condition.  Sh         Final Clinical Impression(s) / ED Diagnoses Final diagnoses:  Cerebrovascular accident (CVA), unspecified mechanism [facility])    Rx / [facility] Orders ED Discharge Orders     None         Davonna Belling, MD 09/26/21 2308

## 2021-09-26 NOTE — Subjective & Objective (Addendum)
CC: found on floor HPI: 70 year old female with a history of hyperlipidemia, COPD, prediabetes, CKD stage III, chronic tobacco abuse, ADD presents to the ER today after being found down on the floor.  Last known normal was about 48 hours ago.  Family talk to her on Sunday night.  No contact with her on Monday.  Patient would not answer the phone until today.  EMS was called.  Patient was found on the ground.  Brought to the ER.  Patient is left-hand dominant.  She has a dense right upper extremity parapsoas.  CT scan demonstrated a large infarct of the left corona radiata, left caudate nucleus, left internal capsule and left external capsule.  Local mass effect with effacement of the left lateral ventricle.  Patient outside the window for TNK due to last known normal being 48 hours ago.  Patient has dysarthria.  Labs notable for rhabdomyolysis with a CK of about 1700.  EKG showed sinus tachycardia.  EDP is consulted neurology.  [facility] hospitalist contacted for admission.

## 2021-09-26 NOTE — Assessment & Plan Note (Addendum)
Pt failed bedside swallow eval.  LDL is  Continue with crestor.

## 2021-09-26 NOTE — Assessment & Plan Note (Addendum)
Further stroke work up in progress.  Started her on aspirin 325 mg dialy and plavix 75 mg daily.  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 MRI large left BG/CR infarct Carotid Doppler left ICA 80 to 99% stenosis Echocardiogram is pending.  Therapy evaluations are pending.

## 2021-09-26 NOTE — Assessment & Plan Note (Signed)
stable °

## 2021-09-26 NOTE — Assessment & Plan Note (Addendum)
Improving CK levels, gently hydrate and repeat CK in am.

## 2021-09-26 NOTE — Assessment & Plan Note (Addendum)
Creatinine appears to be at baseline, continue to monitor.

## 2021-09-26 NOTE — Consult Note (Addendum)
NEURO HOSPITALIST CONSULT NOTE   Requesting physician: Dr. Alvino Chapel  Reason for Consult: Large subacute left basal ganglia stroke  History obtained from:   Chart     HPI:                                                                                                                                          Sheryl [name] is an 71 y.o. female with a PMHx of COPD, CKD3, depression, HLD, HTN, LGSIL, osteoporosis and pre-diabetes who presented to the ED via EMS after her daughter found her on the ground at home. LKW was Sunday night. On arrival to the ED, the patient was A&O, with right-sided weakness and slurred speech.   CT reveals a subacute ischemic infarction involving the left corona radiata, left caudate and lentiform nuclei, left internal capsule and left external capsule.   Past Medical History:  Diagnosis Date   ADD (attention deficit disorder)    Allergy    Arthritis    CKD (chronic kidney disease), stage III (HCC)    COPD (chronic obstructive pulmonary disease) ([facility]) 12/09/2014   Depression    Does use hearing aid    Dyspnea    on exertion   Hyperlipidemia    Hypertension    LGSIL (low grade squamous intraepithelial dysplasia)    Osteoporosis    Pre-diabetes     Past Surgical History:  Procedure Laterality Date   JOINT REPLACEMENT     right knee   MULTIPLE TOOTH EXTRACTIONS     REPLACEMENT TOTAL KNEE  2013   right   REVERSE SHOULDER ARTHROPLASTY Right 03/30/2019   Procedure: REVERSE TOTAL SHOULDER ARTHROPLASTY;  Surgeon: Netta Cedars, MD;  Location: [facility];  Service: Orthopedics;  Laterality: Right;  interscalene block   TOTAL KNEE ARTHROPLASTY Left 12/27/2017   TOTAL KNEE ARTHROPLASTY Left 12/27/2017   Procedure: LEFT TOTAL KNEE ARTHROPLASTY;  Surgeon: Netta Cedars, MD;  Location: [facility];  Service: Orthopedics;  Laterality: Left;    Family History  Problem Relation Age of Onset   Hyperlipidemia Mother    Alcohol abuse Father    Arthritis  Father    Colon polyps Father    Alcohol abuse Maternal Uncle    Stroke Maternal Grandmother             Social History:  reports that she has been smoking cigarettes. She has a 12.25 pack-year smoking history. She has never used smokeless tobacco. She reports that she does not drink alcohol and does not use drugs.  Allergies  Allergen Reactions   Macrobid [Nitrofurantoin Monohyd Macro] Nausea And Vomiting    GI upset    MEDICATIONS:  No current facility-administered medications on file prior to encounter.   Current Outpatient Medications on File Prior to Encounter  Medication Sig Dispense Refill   albuterol (VENTOLIN HFA) 108 (90 Base) MCG/ACT inhaler Inhale 1-2 puffs into the lungs every 4 (four) hours as needed for wheezing or shortness of breath. 16 g 8   amLODipine (NORVASC) 5 MG tablet TAKE 1 TABLET BY MOUTH EVERY DAY (Patient taking differently: Take 5 mg by mouth daily.) 90 tablet 1   atomoxetine (STRATTERA) 100 MG capsule TAKE 1 CAPSULE BY MOUTH EVERY DAY (Patient taking differently: Take 100 mg by mouth daily.) 90 capsule 2   Budeson-Glycopyrrol-Formoterol (BREZTRI AEROSPHERE) 160-9-4.8 MCG/ACT AERO Inhale 2 puffs into the lungs 2 (two) times daily. 10.7 g 5   cyclobenzaprine (FLEXERIL) 10 MG tablet Take 0.5-1 tablets (5-10 mg total) by mouth 3 (three) times daily as needed for muscle spasms. 60 tablet 3   FLUoxetine (PROZAC) 20 MG capsule TAKE 1 CAPSULE (20 MG TOTAL) BY MOUTH DAILY IN ADDITION TO 40 MG FOR A TOTAL OF 60 MG. (Patient taking differently: Take 20 mg by mouth daily. Along with 40 mg to equal 60 mg) 90 capsule 1   FLUoxetine (PROZAC) 40 MG capsule TAKE 1 CAPSULE BY MOUTH DAILY. IN ADDITION TO 20 MG FOR TOTAL OF 60 MG DAILY (Patient taking differently: Take 40 mg by mouth daily. Along with 20 mg to equal 60 mg) 90 capsule 0   fluticasone (FLONASE) 50  MCG/ACT nasal spray Place 1 spray into both nostrils daily as needed for allergies.     hydrochlorothiazide (HYDRODIURIL) 12.5 MG tablet TAKE 1 TABLET BY MOUTH EVERY DAY (Patient taking differently: Take 12.5 mg by mouth daily.) 90 tablet 1   losartan (COZAAR) 100 MG tablet TAKE 1 TABLET BY MOUTH EVERY DAY (Patient taking differently: Take 100 mg by mouth daily.) 90 tablet 1   meclizine (ANTIVERT) 25 MG tablet Take 1 tablet (25 mg total) by mouth 3 (three) times daily as needed for dizziness. 30 tablet 0   omeprazole (PRILOSEC) 20 MG capsule TAKE 1 CAPSULE BY MOUTH EVERY DAY (Patient taking differently: Take 20 mg by mouth daily.) 90 capsule 1   rOPINIRole (REQUIP) 0.25 MG tablet TAKE 1 TABLET BY MOUTH AT BEDTIME. (Patient taking differently: Take 0.25 mg by mouth at bedtime.) 90 tablet 1   rosuvastatin (CRESTOR) 20 MG tablet Take 1 tablet (20 mg total) by mouth daily. 90 tablet 3   traZODone (DESYREL) 50 MG tablet TAKE 1 TABLET BY MOUTH EVERYDAY AT BEDTIME (Patient taking differently: Take 50 mg by mouth at bedtime.) 90 tablet 1     ROS:                                                                                                                                       The patient is unable to provide a comprehensive ROS due to severe dysarthria.  Blood pressure 129/62, pulse 99, temperature 98.4 F (36.9 C), temperature source Oral, resp. rate 18, SpO2 100 %.   General Examination:                                                                                                       Physical Exam  HEENT-  Bruising to forehead noted.    Lungs- Respirations unlabored Extremities- No edema   Neurological Examination Mental Status: Awake and alert. Speech is severely dysarthric, with patient communicating with barely intelligible 1-2 word utterances only. Able to follow all simple commands. Able to name a pinky and thumb but had trouble identifyng an index finger. Oriented to city and  state, but not month, year or day of the week.  Cranial Nerves: II: Temporal visual fields intact bilaterally. PERRL.   III,IV, VI: EOMI but with saccadic pursuits when tracking to the LEFT.  V: FT sensation equal bilaterally VII: Right facial droop VIII: Hearing intact to voice IX,X: Hypophonic speech XI: Severe weakness on the right with shoulder shrug XII: Cannot fully protrude tongue; but contours of tongue are asymmetric when mouth is held open.  Motor: RUE 0/5 proximally and distally; only able to weakly shrug right shoulder RLE 2/5 LUE 4+/5 LLE 5/5 Sensory: Light touch intact to BUE and BLE Deep Tendon Reflexes: 3+ right biceps and brachioradialis. 2+ left biceps and brachioradialis. 3+ right patellar. Low amplitude left patellar with crossed adductor response (4+) Cerebellar: No ataxia with left FNF. Unable to perform on the right.  Gait: Unable to assess   Lab Results: Basic Metabolic Panel: Recent Labs  Lab 09/26/21 2019 09/26/21 2030  NA 141 143  K 6.1* 4.0  CL 113* 108  CO2  --  17*  GLUCOSE 107* 101*  BUN 52* 32*  CREATININE 1.10* 1.24*  CALCIUM  --  9.8    CBC: Recent Labs  Lab 09/26/21 2019 09/26/21 2030  WBC  --  14.8*  NEUTROABS  --  12.3*  HGB 10.9* 10.3*  HCT 32.0* 34.4*  MCV  --  91.7  PLT  --  400    Cardiac Enzymes: Recent Labs  Lab 09/26/21 2030  CKTOTAL 1,795*    Lipid Panel: No results for input(s): CHOL, TRIG, HDL, CHOLHDL, VLDL, LDLCALC in the last 168 hours.  Imaging: CT ANGIO HEAD NECK W WO CM  Result Date: 09/26/2021 CLINICAL DATA:  Provided history: Neuro deficit, acute, stroke suspected. Additional history provided: Patient from home after daughter found patient on ground, last known well Sunday night, stroke-like symptoms, right arm weakness, slurred speech. EXAM: CT ANGIOGRAPHY HEAD AND NECK TECHNIQUE: Multidetector CT imaging of the head and neck was performed using the standard protocol during bolus administration of  intravenous contrast. Multiplanar CT image reconstructions and MIPs were obtained to evaluate the vascular anatomy. Carotid stenosis measurements (when applicable) are obtained utilizing NASCET criteria, using the distal internal carotid diameter as the denominator. RADIATION DOSE REDUCTION: This exam was performed according to the departmental dose-optimization program which includes automated exposure control, adjustment of the mA and/or kV according to  patient size and/or use of iterative reconstruction technique. CONTRAST:  43mL OMNIPAQUE IOHEXOL 350 MG/ML SOLN COMPARISON:  None. FINDINGS: CT HEAD FINDINGS Brain: Cerebral volume appears normal for age. Acute or early subacute infarct measuring 4.8 x 2.7 x 3.7 cm within the left corona radiata, left caudate and lentiform nuclei, left internal capsule and left external capsule. Mild local mass effect with partial effacement of the left lateral ventricle. No midline shift. No evidence of hemorrhagic conversion. No demarcated cortical infarct. No extra-axial fluid collection. No evidence of an intracranial mass. No midline shift. Vascular: No hyperdense vessel.  Atherosclerotic calcifications. Skull: Normal. Negative for fracture or focal lesion. Sinuses: Visualized orbits show no acute finding. Orbits: No orbital mass or acute orbital finding. Review of the MIP images confirms the above findings CTA NECK FINDINGS Aortic arch: Standard aortic branching. Atherosclerotic plaque within the visualized aortic arch and proximal major branch vessels of the neck. Motion artifact limits evaluation of the right subclavian artery. Streak and beam hardening artifact arising from a dense left-sided contrast bolus limits evaluation of the left subclavian artery. Within this limitation, no definite hemodynamically significant stenosis is identified within the innominate or proximal subclavian arteries. Right carotid system: CCA and ICA patent within the neck without significant  stenosis (50% or greater). Mild atherosclerotic plaque about the carotid bifurcation and within the proximal ICA. Left carotid system: CCA and ICA patent within the neck. Soft calcified plaque about the carotid bifurcation and within the proximal ICA. Stenosis at the origin of the left ICA which is difficult to accurately quantify due to blooming artifact from calcified plaque, but appears greater than 80%. Distal to this, the left ICA is asymmetrically diminutive within the neck as compared to the right ICA. Vertebral arteries: Vertebral arteries patent within the neck without significant stenosis. Skeleton: Please refer to the separately reported CT of the cervical spine for cervical spine findings. There, no acute bony abnormality or aggressive osseous lesion is identified. Other neck: No neck mass or cervical lymphadenopathy. Subcentimeter calcified nodule within the left thyroid lobe not meeting consensus criteria for ultrasound follow-up based on size. Upper chest: No consolidation within the imaged lung apices. Centrilobular emphysema. Review of the MIP images confirms the above findings CTA HEAD FINDINGS Anterior circulation: The intracranial internal carotid arteries are patent. Calcified plaque within both vessels with no more than mild stenosis. The left M1 middle cerebral artery is occluded at its origin. There is some reconstitution of enhancement within the M2 and more distal left MCA vessels. Over, there is a severe stenosis versus focal occlusion within an inferior division proximal M2 left MCA vessel (series 14, image 25). The M1 right MCA is patent. No right M2 proximal branch occlusion or high-grade proximal stenosis. The anterior cerebral arteries are patent. No intracranial aneurysm is identified. Posterior circulation: The intracranial vertebral arteries are patent. The basilar artery is patent. The posterior cerebral arteries are patent. Predominantly fetal origin of the right posterior  cerebral artery. A sizable left posterior communicating artery is present. Venous sinuses: Within the limitations of contrast timing, no convincing thrombus. Anatomic variants: As described. Review of the MIP images confirms the above findings These results were called by telephone at the time of interpretation on 09/26/2021 at 9:34 pm to provider Davonna Belling , who verbally acknowledged these results. CT head and CTA head impression #1 called by telephone at the time of interpretation on 09/26/2021 at 9:55 pm to provider Davonna Belling , who verbally acknowledged these results. IMPRESSION: CT head:  Acute or early subacute infarct measuring 4.8 x 2.7 x 3.7 cm within the left corona radiata, left caudate and lentiform nuclei, left internal capsule and left external capsule. Local mass effect with partial effacement of the left lateral ventricle. No evidence of hemorrhagic conversion. CTA neck: 1. The left common and internal carotid arteries are patent within the neck. Prominent atherosclerotic plaque about the left carotid bifurcation and within the proximal ICA. Exact quantification of stenosis at the origin of the left ICA is difficult due to blooming artifact from calcified plaque at this site, but appears greater than 80%. Consider correlation with a carotid artery duplex ultrasound. 2. The right common and internal carotid arteries are patent within the neck without hemodynamically significant stenosis. Mild atherosclerotic plaque within the right carotid system within the neck, as described. 3. Vertebral arteries patent within the neck without significant stenosis. 4. Aortic Atherosclerosis (ICD10-I70.0) and Emphysema (ICD10-J43.9). CTA head: 1. 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 There is some reconstitution of enhancement within the M2 and more distal left MCA vessels. However, there is a superimposed severe stenosis versus focal occlusion within an inferior division left M2 MCA  vessel. 2. Calcified plaque within the intracranial internal carotid arteries with no more than mild stenosis. Electronically Signed   By: Kellie Simmering D.O.   On: 09/26/2021 21:56   CT C-SPINE NO CHARGE  Result Date: 09/26/2021 CLINICAL DATA:  Found on floor. EXAM: CT CERVICAL SPINE WITHOUT CONTRAST TECHNIQUE: Multidetector CT imaging of the cervical spine was performed without intravenous contrast. Multiplanar CT image reconstructions were also generated. RADIATION DOSE REDUCTION: This exam was performed according to the departmental dose-optimization program which includes automated exposure control, adjustment of the mA and/or kV according to patient size and/or use of iterative reconstruction technique. COMPARISON:  None. FINDINGS: Alignment: Normal Skull base and vertebrae: No acute fracture. No primary bone lesion or focal pathologic process. Soft tissues and spinal canal: No prevertebral fluid or swelling. No visible canal hematoma. Disc levels: Advanced diffuse degenerative facet disease bilaterally. Upper chest: Emphysema.  No acute abnormality. Other: None IMPRESSION: Advanced diffuse degenerative facet disease. No acute bony abnormality. Electronically Signed   By: Rolm Baptise M.D.   On: 09/26/2021 21:30   DG Chest Portable 1 View  Result Date: 09/26/2021 CLINICAL DATA:  Fall EXAM: PORTABLE CHEST 1 VIEW COMPARISON:  06/02/2021 FINDINGS: Right shoulder replacement. Borderline to mild cardiomegaly. No focal opacity or pleural effusion. No pneumothorax IMPRESSION: No active disease.  Borderline cardiomegaly Electronically Signed   By: Donavan Foil M.D.   On: 09/26/2021 20:57     Assessment: 71 year old female presenting with right sided weakness and slurred speech. CT head reveals a large subacute left basal ganglia stroke. LKN Sunday night.  1. Exam reveals findings consistent with her left basal ganglia and internal capsule infarction.  2. CT head: Acute or early subacute infarct measuring 4.8  x 2.7 x 3.7 cm within the left corona radiata, left caudate and lentiform nuclei, left internal capsule and left external capsule. Local mass effect with partial effacement of the left lateral ventricle. No evidence of hemorrhagic conversion. 3. CTA neck: The left common and internal carotid arteries are patent within the neck. Prominent atherosclerotic plaque about the left carotid bifurcation and within the proximal ICA. Exact quantification of stenosis at the origin of the left ICA is difficult due to blooming artifact from calcified plaque at this site, but appears greater than 80%. The right common and  internal carotid arteries are patent within the neck without hemodynamically significant stenosis. Mild atherosclerotic plaque within the right carotid system within the neck. Vertebral arteries patent within the neck without significant stenosis. 4. CTA head: The M1 left middle cerebral artery is occluded at its origin. There is some reconstitution of enhancement within the M2 and more distal left MCA vessels. However, there is a superimposed severe stenosis versus focal occlusion within an inferior division left M2 MCA vessel. Calcified plaque within the intracranial internal carotid arteries with no more than mild stenosis. 5. CK 1795, most likely secondary to prolonged immobility on her floor at home.  6. CKD, most likely with superimposed AKI due to dehydration as a result of prolonged immobility at home 7. Leukocytosis.   Recommendations: 1. Carotid ultrasound and Vascular Surgery consultation for symptomatic left ICA stenosis.  2. MRI of the brain without contrast 3. PT consult, OT consult, Speech consult 4. TTE 5. Continue her statin 6. Starting ASA  7. Risk factor modification 8. Telemetry monitoring 9. Frequent neuro checks 10. NPO until passes stroke swallow screen 11. HgbA1c, fasting lipid panel 12. BP management. Out of the permissive HTN time window.  13. IVF and renal protection  regimen per primary team.    Electronically signed: Dr. Kerney Elbe 09/26/2021, 10:01 PM

## 2021-09-26 NOTE — ED Triage Notes (Signed)
Pt BIB [facility] from home after daughter found pt on ground. LKW Sunday night. Pt A&O, presenting with stroke like symptoms, R sided weakness, slurred speech. Hx- COPD

## 2021-09-27 ENCOUNTER — Inpatient Hospital Stay (HOSPITAL_COMMUNITY): Payer: Medicare PPO

## 2021-09-27 ENCOUNTER — Encounter (HOSPITAL_COMMUNITY): Payer: Self-pay | Admitting: Internal Medicine

## 2021-09-27 DIAGNOSIS — F17210 Nicotine dependence, cigarettes, uncomplicated: Secondary | ICD-10-CM

## 2021-09-27 DIAGNOSIS — I159 Secondary hypertension, unspecified: Secondary | ICD-10-CM

## 2021-09-27 DIAGNOSIS — I6522 Occlusion and stenosis of left carotid artery: Secondary | ICD-10-CM

## 2021-09-27 DIAGNOSIS — I6359 Cerebral infarction due to unspecified occlusion or stenosis of other cerebral artery: Secondary | ICD-10-CM

## 2021-09-27 DIAGNOSIS — I63232 Cerebral infarction due to unspecified occlusion or stenosis of left carotid arteries: Secondary | ICD-10-CM

## 2021-09-27 DIAGNOSIS — E785 Hyperlipidemia, unspecified: Secondary | ICD-10-CM

## 2021-09-27 DIAGNOSIS — E782 Mixed hyperlipidemia: Secondary | ICD-10-CM

## 2021-09-27 LAB — LIPID PANEL
Cholesterol: 168 mg/dL (ref 0–200)
HDL: 50 mg/dL (ref >40)
LDL Cholesterol: 87 mg/dL (ref 0–99)
Total CHOL/HDL Ratio: 3.4 RATIO
Triglycerides: 155 mg/dL — ABNORMAL HIGH (ref <150)
VLDL: 31 mg/dL (ref 0–40)

## 2021-09-27 LAB — HEMOGLOBIN A1C
Hgb A1c MFr Bld: 5.5 % (ref 4.8–5.6)
Mean Plasma Glucose: 111.15 mg/dL

## 2021-09-27 LAB — CK: Total CK: 832 U/L — ABNORMAL HIGH (ref 38–234)

## 2021-09-27 MED ORDER — ASPIRIN EC 325 MG PO TBEC
325.0000 mg | DELAYED_RELEASE_TABLET | Freq: Every day | ORAL | Status: DC
  Administered 2021-09-28 – 2021-10-02 (×5): 325 mg via ORAL
  Filled 2021-09-27 (×5): qty 1

## 2021-09-27 MED ORDER — ASPIRIN EC 81 MG PO TBEC: 81.0000 mg | DELAYED_RELEASE_TABLET | Freq: Every day | ORAL | Status: DC

## 2021-09-27 MED ORDER — CLOPIDOGREL BISULFATE 75 MG PO TABS
75.0000 mg | ORAL_TABLET | Freq: Every day | ORAL | Status: DC
  Administered 2021-09-27 – 2021-10-02 (×6): 75 mg via ORAL
  Filled 2021-09-27 (×6): qty 1

## 2021-09-27 MED ORDER — SODIUM CHLORIDE 0.9 % IV SOLN: INTRAVENOUS | Status: DC

## 2021-09-27 MED ORDER — ENOXAPARIN SODIUM 30 MG/0.3ML IJ SOSY
30.0000 mg | PREFILLED_SYRINGE | INTRAMUSCULAR | Status: DC
  Administered 2021-09-27 – 2021-10-01 (×5): 30 mg via SUBCUTANEOUS
  Filled 2021-09-27 (×5): qty 0.3

## 2021-09-27 MED ORDER — ASPIRIN 300 MG RE SUPP
300.0000 mg | Freq: Every day | RECTAL | Status: DC
  Administered 2021-09-27: 300 mg via RECTAL
  Filled 2021-09-27: qty 1

## 2021-09-27 MED ORDER — ROSUVASTATIN CALCIUM 20 MG PO TABS
20.0000 mg | ORAL_TABLET | Freq: Every day | ORAL | Status: DC
  Administered 2021-09-28: 20 mg via ORAL
  Filled 2021-09-27: qty 1

## 2021-09-27 MED ORDER — FOOD THICKENER (SIMPLYTHICK): 10.0000 | ORAL | Status: DC | PRN

## 2021-09-27 NOTE — Progress Notes (Signed)
Carotid artery duplex has been completed. Preliminary results can be found in CV Proc through chart review.  Results were given to the patient's nurse, [place].  09/27/21 12:38 PM Carlos Levering RVT

## 2021-09-27 NOTE — Progress Notes (Signed)
[facility]                                                                               Sheryl [name], is a 71 y.o. female, DOB - 1950/12/31, SHU:[id number] Admit date - 09/26/2021    Outpatient Primary MD for the patient is Burns, Claudina Lick, MD  LOS - 1  days    Brief summary   71 year old female with a history of hyperlipidemia, COPD, prediabetes, CKD stage III, chronic tobacco abuse, ADD presents to the ER today after being found down on the floor.  Last known normal was about 48 hours ago.  Family talk to her on Sunday night.  No contact with her on Monday.  Patient would not answer the phone until today.  EMS was called.  Patient was found on the ground.  Brought to the ER.  Patient is left-hand dominant.  She has a dense right upper extremity parapsoas.  CT scan demonstrated a large infarct of the left corona radiata, left caudate nucleus, left internal capsule and left external capsule.  Local mass effect with effacement of the left lateral ventricle. NEUROLOGY consulted and recommendations given.   Assessment & Plan    Assessment and Plan: * Acute ischemic left MCA stroke ([place])- (present on admission) Further stroke work up in progress.  Started her on aspirin 325 mg dialy and plavix 75 mg daily.  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 MRI large left BG/CR infarct Carotid Doppler left ICA 80 to 99% stenosis Echocardiogram is pending.  Therapy evaluations are pending.   Hyperlipidemia- (present on admission) Pt failed bedside swallow eval.  LDL is  Continue with crestor.   Tobacco abuse- (present on admission) Add nicotine patch.  Rhabdomyolysis Improving CK levels, gently hydrate and repeat CK in am.   GERD (gastroesophageal reflux disease) Stable.   Stage 3a chronic kidney disease (CKD) (HCC) - baseline SCr 1.1-1.3- (present on  admission) Creatinine appears to be at baseline, continue to monitor.   Prediabetes- (present on admission) Check A1c.  COPD (chronic obstructive pulmonary disease) ([place])- (present on admission) No wheezing heard on exam.   ADD (attention deficit disorder)- (present on admission) stable  HTN (hypertension)- (present on admission) permissive hypertension.          Estimated body mass index is 26.87 kg/m as calculated from the following:   Height as of 04/17/21: 5\' 1"  (1.549 m).   Weight as of 04/17/21: 64.5 kg.  Code Status: FULL CODE.  DVT Prophylaxis:  enoxaparin (LOVENOX) injection 30 mg Start: 09/27/21 1700 SCD's Start: 09/26/21 2337   Level of Care: Level of care: Progressive Family Communication: Updated patient's family at bedside.   Disposition Plan:     Remains inpatient appropriate:STROKE WORKUP   Procedures:  MRI brain CTA HEAD AND NECK.  CAROTID DUPLEX  Consultants:   Neurology Vascular surgery.   Antimicrobials:   Anti-infectives (From admission, onward)   None        Medications  Scheduled Meds:   stroke: mapping our early stages of recovery book   Does not apply Once  aspirin  300 mg Rectal Daily   Or   aspirin EC  325 mg Oral Daily   clopidogrel  75 mg Oral Daily   enoxaparin (LOVENOX) injection  30 mg Subcutaneous Q24H   nicotine  14 mg Transdermal Daily   rosuvastatin  20 mg Oral Daily   Continuous Infusions:  lactated ringers 75 mL/hr at 09/27/21 0813   PRN Meds:.acetaminophen **OR** acetaminophen (TYLENOL) oral liquid 160 mg/5 mL **OR** acetaminophen, food thickener    Subjective:   Sheryl [name] was seen and examined today. No new complaints. Following simple commands.   Objective:   Vitals:   09/27/21 1003 09/27/21 1207 09/27/21 1237 09/27/21 1300  BP: (!) 142/57 (!) 141/73  (!) 145/54  Pulse: 73 91 98 89  Resp: 19 (!) 24 20 20   Temp:      TempSrc:      SpO2: 100% 99% 93% 98%   No intake or output data  in the 24 hours ending 09/27/21 1611 There were no vitals filed for this visit.   Exam  General: Alert and oriented to person and place.   Cardiovascular: S1 S2 auscultated, no murmurs, RRR  Respiratory: Clear to auscultation bilaterally, no wheezing, rales or rhonchi  Gastrointestinal: Soft, nontender, nondistended, + bowel sounds  Ext: no pedal edema bilaterally  Neuro: facial droop on the right, right lower extremity and upper extremity weakness presistent.   Skin: No rashes  Psych mood appropriate.    Data Reviewed:  I have personally reviewed following labs and imaging studies   CBC Lab Results  Component Value Date   WBC 14.8 (H) 09/26/2021   RBC 3.75 (L) 09/26/2021   HGB 10.3 (L) 09/26/2021   HCT 34.4 (L) 09/26/2021   MCV 91.7 09/26/2021   MCH 27.5 09/26/2021   PLT 400 09/26/2021   MCHC 29.9 (L) 09/26/2021   RDW 14.8 09/26/2021   LYMPHSABS 1.4 09/26/2021   MONOABS 1.0 09/26/2021   EOSABS 0.0 09/26/2021   BASOSABS 0.1 33/82/5053     Last metabolic panel Lab Results  Component Value Date   NA 143 09/26/2021   K 4.0 09/26/2021   CL 108 09/26/2021   CO2 17 (L) 09/26/2021   BUN 32 (H) 09/26/2021   CREATININE 1.24 (H) 09/26/2021   GLUCOSE 101 (H) 09/26/2021   GFRNONAA 47 (L) 09/26/2021   GFRAA >60 04/02/2019   CALCIUM 9.8 09/26/2021   PROT 8.3 (H) 09/26/2021   ALBUMIN 3.8 09/26/2021   LABGLOB 3.2 02/18/2017   AGRATIO 1.3 02/18/2017   BILITOT 0.5 09/26/2021   ALKPHOS 142 (H) 09/26/2021   AST 140 (H) 09/26/2021   ALT 76 (H) 09/26/2021   ANIONGAP 18 (H) 09/26/2021    CBG (last 3)  No results for input(s): GLUCAP in the last 72 hours.    Coagulation Profile: Recent Labs  Lab 09/26/21 2239  INR 1.1     Radiology Studies: CT ANGIO HEAD NECK W WO CM  Result Date: 09/26/2021 CLINICAL DATA:  Provided history: Neuro deficit, acute, stroke suspected. Additional history provided: Patient from home after daughter found patient on ground, last  known well Sunday night, stroke-like symptoms, right arm weakness, slurred speech. EXAM: CT ANGIOGRAPHY HEAD AND NECK TECHNIQUE: Multidetector CT imaging of the head and neck was performed using the standard protocol during bolus administration of intravenous contrast. Multiplanar CT image reconstructions and MIPs were obtained to evaluate the vascular anatomy. Carotid stenosis measurements (when applicable) are obtained utilizing NASCET criteria, using the distal internal carotid diameter as  the denominator. RADIATION DOSE REDUCTION: This exam was performed according to the departmental dose-optimization program which includes automated exposure control, adjustment of the mA and/or kV according to patient size and/or use of iterative reconstruction technique. CONTRAST:  34mL OMNIPAQUE IOHEXOL 350 MG/ML SOLN COMPARISON:  None. FINDINGS: CT HEAD FINDINGS Brain: Cerebral volume appears normal for age. Acute or early subacute infarct measuring 4.8 x 2.7 x 3.7 cm within the left corona radiata, left caudate and lentiform nuclei, left internal capsule and left external capsule. Mild local mass effect with partial effacement of the left lateral ventricle. No midline shift. No evidence of hemorrhagic conversion. No demarcated cortical infarct. No extra-axial fluid collection. No evidence of an intracranial mass. No midline shift. Vascular: No hyperdense vessel.  Atherosclerotic calcifications. Skull: Normal. Negative for fracture or focal lesion. Sinuses: Visualized orbits show no acute finding. Orbits: No orbital mass or acute orbital finding. Review of the MIP images confirms the above findings CTA NECK FINDINGS Aortic arch: Standard aortic branching. Atherosclerotic plaque within the visualized aortic arch and proximal major branch vessels of the neck. Motion artifact limits evaluation of the right subclavian artery. Streak and beam hardening artifact arising from a dense left-sided contrast bolus limits evaluation of  the left subclavian artery. Within this limitation, no definite hemodynamically significant stenosis is identified within the innominate or proximal subclavian arteries. Right carotid system: CCA and ICA patent within the neck without significant stenosis (50% or greater). Mild atherosclerotic plaque about the carotid bifurcation and within the proximal ICA. Left carotid system: CCA and ICA patent within the neck. Soft calcified plaque about the carotid bifurcation and within the proximal ICA. Stenosis at the origin of the left ICA which is difficult to accurately quantify due to blooming artifact from calcified plaque, but appears greater than 80%. Distal to this, the left ICA is asymmetrically diminutive within the neck as compared to the right ICA. Vertebral arteries: Vertebral arteries patent within the neck without significant stenosis. Skeleton: Please refer to the separately reported CT of the cervical spine for cervical spine findings. There, no acute bony abnormality or aggressive osseous lesion is identified. Other neck: No neck mass or cervical lymphadenopathy. Subcentimeter calcified nodule within the left thyroid lobe not meeting consensus criteria for ultrasound follow-up based on size. Upper chest: No consolidation within the imaged lung apices. Centrilobular emphysema. Review of the MIP images confirms the above findings CTA HEAD FINDINGS Anterior circulation: The intracranial internal carotid arteries are patent. Calcified plaque within both vessels with no more than mild stenosis. The left M1 middle cerebral artery is occluded at its origin. There is some reconstitution of enhancement within the M2 and more distal left MCA vessels. Over, there is a severe stenosis versus focal occlusion within an inferior division proximal M2 left MCA vessel (series 14, image 25). The M1 right MCA is patent. No right M2 proximal branch occlusion or high-grade proximal stenosis. The anterior cerebral arteries are  patent. No intracranial aneurysm is identified. Posterior circulation: The intracranial vertebral arteries are patent. The basilar artery is patent. The posterior cerebral arteries are patent. Predominantly fetal origin of the right posterior cerebral artery. A sizable left posterior communicating artery is present. Venous sinuses: Within the limitations of contrast timing, no convincing thrombus. Anatomic variants: As described. Review of the MIP images confirms the above findings These results were called by telephone at the time of interpretation on 09/26/2021 at 9:34 pm to provider Davonna Belling , who verbally acknowledged these results. CT head and CTA head  impression #1 called by telephone at the time of interpretation on 09/26/2021 at 9:55 pm to provider Davonna Belling , who verbally acknowledged these results. IMPRESSION: CT head: Acute or early subacute infarct measuring 4.8 x 2.7 x 3.7 cm within the left corona radiata, left caudate and lentiform nuclei, left internal capsule and left external capsule. Local mass effect with partial effacement of the left lateral ventricle. No evidence of hemorrhagic conversion. CTA neck: 1. The left common and internal carotid arteries are patent within the neck. Prominent atherosclerotic plaque about the left carotid bifurcation and within the proximal ICA. Exact quantification of stenosis at the origin of the left ICA is difficult due to blooming artifact from calcified plaque at this site, but appears greater than 80%. Consider correlation with a carotid artery duplex ultrasound. 2. The right common and internal carotid arteries are patent within the neck without hemodynamically significant stenosis. Mild atherosclerotic plaque within the right carotid system within the neck, as described. 3. Vertebral arteries patent within the neck without significant stenosis. 4. Aortic Atherosclerosis (ICD10-I70.0) and Emphysema (ICD10-J43.9). CTA head: 1. The M1 left middle  cerebral artery is occluded at its origin. There is some reconstitution of enhancement within the M2 and more distal left MCA vessels. However, there is a superimposed severe stenosis versus focal occlusion within an inferior division left M2 MCA vessel. 2. Calcified plaque within the intracranial internal carotid arteries with no more than mild stenosis. Electronically Signed   By: Kellie Simmering D.O.   On: 09/26/2021 21:56   DG Abd 1 View  Result Date: 09/27/2021 CLINICAL DATA:  MRI screening EXAM: ABDOMEN - 1 VIEW COMPARISON:  None. FINDINGS: Unremarkable bowel gas pattern. There is residual contrast within the bladder. No radiopaque foreign body. IMPRESSION: No radiopaque foreign body. Electronically Signed   By: Macy Mis M.D.   On: 09/27/2021 11:31   MR BRAIN WO CONTRAST  Result Date: 09/27/2021 CLINICAL DATA:  Stroke, follow-up. Additional history provided: Provided history: EXAM: MRI HEAD WITHOUT CONTRAST TECHNIQUE: Multiplanar, multiecho pulse sequences of the brain and surrounding structures were obtained without intravenous contrast. COMPARISON:  CT angiogram head/neck 09/26/2021. FINDINGS: Brain: Mild intermittent motion degradation. Redemonstrated acute/early subacute infarct within the left corona radiata, left caudate and lentiform nuclei, left internal capsule and left external capsule. This infarct has slightly increased in size since the CT angiogram head/neck of 09/26/2021, now measuring 5.5 x 2.7 x 3.7 cm (previously 4.8 x 2.7 x 3.7 cm). As before, there is local mass effect with partial effacement of the left lateral ventricle. No midline shift or evidence of hemorrhagic conversion. Additional small acute/early subacute infarct within the anteromedial left temporal lobe (in retrospect likely present on the prior CTA head of 09/26/2021). There are several additional small acute early/subacute cortically based infarcts within the left temporal, parietal and frontal lobes (left MCA  vascular territory). No evidence of an intracranial mass. No extra-axial fluid collection. 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 A severe stenosis versus focal occlusion was also noted within an inferior division proximal M2 left MCA vessel on this prior exam. Skull and upper cervical spine: No focal suspicious marrow lesion. Sinuses/Orbits: Visualized orbits show no acute finding. Bilateral ocular lens replacements. IMPRESSION: 5.5 x 2.7 x 3.7 cm acute/early subacute infarct within the left corona radiata, basal ganglia, internal capsule and external capsule. This infarct has slightly increased in size since the CTA head/neck of 09/26/2021. As before, there is  local mass effect with partial effacement of the left lateral ventricle. No midline shift or evidence of hemorrhagic conversion. Multiple additional small acute/early subacute cortically-based infarcts within the left frontal, parietal and temporal lobes (left MCA vascular territory). Electronically Signed   By: Kellie Simmering D.O.   On: 09/27/2021 12:18   CT C-SPINE NO CHARGE  Result Date: 09/26/2021 CLINICAL DATA:  Found on floor. EXAM: CT CERVICAL SPINE WITHOUT CONTRAST TECHNIQUE: Multidetector CT imaging of the cervical spine was performed without intravenous contrast. Multiplanar CT image reconstructions were also generated. RADIATION DOSE REDUCTION: This exam was performed according to the departmental dose-optimization program which includes automated exposure control, adjustment of the mA and/or kV according to patient size and/or use of iterative reconstruction technique. COMPARISON:  None. FINDINGS: Alignment: Normal Skull base and vertebrae: No acute fracture. No primary bone lesion or focal pathologic process. Soft tissues and spinal canal: No prevertebral fluid or swelling. No visible canal hematoma. Disc levels: Advanced diffuse degenerative facet disease  bilaterally. Upper chest: Emphysema.  No acute abnormality. Other: None IMPRESSION: Advanced diffuse degenerative facet disease. No acute bony abnormality. Electronically Signed   By: Rolm Baptise M.D.   On: 09/26/2021 21:30   DG Chest Portable 1 View  Result Date: 09/26/2021 CLINICAL DATA:  Fall EXAM: PORTABLE CHEST 1 VIEW COMPARISON:  06/02/2021 FINDINGS: Right shoulder replacement. Borderline to mild cardiomegaly. No focal opacity or pleural effusion. No pneumothorax IMPRESSION: No active disease.  Borderline cardiomegaly Electronically Signed   By: Donavan Foil M.D.   On: 09/26/2021 20:57   VAS US CAROTID  Result Date: 09/27/2021 Carotid Arterial Duplex Study Patient Name:  MIROSLAVA SANTELLAN  Date of Exam:   09/27/2021 Medical Rec #: [id number]        Accession #:    [id number] Date of Birth: [birth year]       Patient Gender: F Patient Age:   3 years Exam Location:  [facility] Procedure:      VAS US CAROTID Referring Phys: Aldona Bar RHYNE --------------------------------------------------------------------------------  Risk Factors:      Hypertension, hyperlipidemia. Comparison Study:  09/26/2021 - CTA neck:                     1. The left common and internal carotid arteries are patent                    within                    the neck. Prominent atherosclerotic plaque about the left                    carotid                    bifurcation and within the proximal ICA. Exact quantification                    of                    stenosis at the origin of the left ICA is difficult due to                    blooming                    artifact from calcified plaque at this site, but appears  greater                    than 80%. Consider correlation with a carotid artery duplex                    ultrasound.                    2. The right common and internal carotid arteries are patent                    within                    the neck without hemodynamically significant stenosis.  Mild                    atherosclerotic plaque within the right carotid system within                    the                    neck, as described.                    3. Vertebral arteries patent within the neck without                    significant                    stenosis.                    4. Aortic Atherosclerosis (ICD10-I70.0) and Emphysema                    (ICD10-J43.9). Performing Technologist: Oliver Hum RVT  Examination Guidelines: A complete evaluation includes B-mode imaging, spectral Doppler, color Doppler, and power Doppler as needed of all accessible portions of each vessel. Bilateral testing is considered an integral part of a complete examination. Limited examinations for reoccurring indications may be performed as noted.  Right Carotid Findings: +----------+--------+--------+--------+-----------------------+--------+             PSV cm/s EDV cm/s Stenosis Plaque Description      Comments  +----------+--------+--------+--------+-----------------------+--------+  CCA Prox   90       19                smooth and heterogenous           +----------+--------+--------+--------+-----------------------+--------+  CCA Distal 73       21                smooth and heterogenous           +----------+--------+--------+--------+-----------------------+--------+  ICA Prox   74       22                calcific                          +----------+--------+--------+--------+-----------------------+--------+  ICA Distal 111      38                                        tortuous  +----------+--------+--------+--------+-----------------------+--------+  ECA        97       12                                                  +----------+--------+--------+--------+-----------------------+--------+ +----------+--------+-------+--------+-------------------+  PSV cm/s EDV cms Describe Arm Pressure (mmHG)  +----------+--------+-------+--------+-------------------+  Subclavian 150                                             +----------+--------+-------+--------+-------------------+ +---------+--------+--+--------+--+---------+  Vertebral PSV cm/s 88 EDV cm/s 17 Antegrade  +---------+--------+--+--------+--+---------+  Left Carotid Findings: +----------+-------+-------+--------+---------------------------------+--------+             PSV     EDV     Stenosis Plaque Description                Comments              cm/s    cm/s                                                         +----------+-------+-------+--------+---------------------------------+--------+  CCA Prox   71      10               smooth and heterogenous                     +----------+-------+-------+--------+---------------------------------+--------+  CCA Distal 55      5                smooth and heterogenous                     +----------+-------+-------+--------+---------------------------------+--------+  ICA Prox   413     127     80-99%   calcific, irregular and                                                          heterogenous                                +----------+-------+-------+--------+---------------------------------+--------+  ICA Mid    157     33                                                           +----------+-------+-------+--------+---------------------------------+--------+  ICA Distal 63      19                                                 tortuous  +----------+-------+-------+--------+---------------------------------+--------+  ECA        166     18                                                           +----------+-------+-------+--------+---------------------------------+--------+ +----------+--------+--------+--------+-------------------+  PSV cm/s EDV cm/s Describe Arm Pressure (mmHG)  +----------+--------+--------+--------+-------------------+  Subclavian 134                                             +----------+--------+--------+--------+-------------------+  +---------+--------+--+--------+--+---------+  Vertebral PSV cm/s 73 EDV cm/s 17 Antegrade  +---------+--------+--+--------+--+---------+   Summary: Right Carotid: Velocities in the right ICA are consistent with a 1-39% stenosis. Left Carotid: Velocities in the left ICA are consistent with a 80-99% stenosis. Vertebrals: Bilateral vertebral arteries demonstrate antegrade flow. *See table(s) above for measurements and observations.     Preliminary        Hosie Poisson M.D. [facility] 09/27/2021, 4:11 PM  Available via Epic secure chat 7am-7pm After 7 pm, please refer to night coverage provider listed on amion.

## 2021-09-27 NOTE — Progress Notes (Incomplete)
°  Echocardiogram 2D Echocardiogram attempted 2x. Pt unavailable. Will attempt again as time permits.   Fidel Levy 09/27/2021, 2:19 PM

## 2021-09-27 NOTE — ED Notes (Addendum)
Report given to [place], RN on 3W. All questions answered.

## 2021-09-27 NOTE — Assessment & Plan Note (Signed)
permissive hypertension.

## 2021-09-27 NOTE — Assessment & Plan Note (Signed)
Stable

## 2021-09-27 NOTE — Evaluation (Signed)
Occupational Therapy Evaluation Patient Details Name: Sheryl [name] MRN: [id number] DOB: [birth year] Today's Date: 09/27/2021   History of Present Illness Pt is a 71 year old woman found down by her daughter in her home. 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 PMH: COPD, current smoker, HTN, HLD, prediabetes, CKD3, ADD, L TKA, R reverse TSA, depression, hearling loss.   Clinical Impression   Pt was independent prior to admission. Presents with dense R hemiplegia, impaired speech and poor sitting and standing balance. She requires 2 person assist, but was able to stand with R knee blocked. She needs min to total assist for ADL. Recommending intensive rehab upon discharge.      Recommendations for follow up therapy are one component of a multi-disciplinary discharge planning process, led by the attending physician.  Recommendations may be updated based on patient status, additional functional criteria and insurance authorization.   Follow Up Recommendations  Acute inpatient rehab (3hours/day)    Assistance Recommended at Discharge Frequent or constant Supervision/Assistance  Patient can return home with the following Two people to help with walking and/or transfers;Two people to help with bathing/dressing/bathroom;Assistance with cooking/housework;Assistance with feeding;Direct supervision/assist for medications management;Direct supervision/assist for financial management;Assist for transportation;Help with stairs or ramp for entrance    Functional Status Assessment  Patient has had a recent decline in their functional status and demonstrates the ability to make significant improvements in function in a reasonable and predictable amount of time.  Equipment Recommendations  Other (comment) (defer to next venue)    Recommendations for Other Services Rehab consult     Precautions /  Restrictions Precautions Precautions: Fall      Mobility Bed Mobility Overal bed mobility: Needs Assistance Bed Mobility: Supine to Sit, Sit to Supine     Supine to sit: Mod assist Sit to supine: Mod assist   General bed mobility comments: assist for LEs over EOB and to raise trunk, assist for LEs back into bed, pt able to scoot hips to EOB, +2 total assist to pull up in bed    Transfers Overall transfer level: Needs assistance   Transfers: Sit to/from Stand Sit to Stand: +2 physical assistance, Mod assist                  Balance Overall balance assessment: Needs assistance   Sitting balance-Leahy Scale: Poor Sitting balance - Comments: min to min guard assist Postural control: Right lateral lean Standing balance support: Single extremity supported Standing balance-Leahy Scale: Poor                             ADL either performed or assessed with clinical judgement   ADL Overall ADL's : Needs assistance/impaired Eating/Feeding: Minimal assistance;Bed level   Grooming: Minimal assistance;Sitting   Upper Body Bathing: Maximal assistance;Sitting   Lower Body Bathing: +2 for physical assistance;Total assistance;Sit to/from stand   Upper Body Dressing : Maximal assistance;Sitting   Lower Body Dressing: Total assistance;+2 for physical assistance;Sit to/from stand       Toileting- Water quality scientist and Hygiene: +2 for physical assistance;Total assistance;Sit to/from stand               Vision Baseline Vision/History: 1 Wears glasses (reading) Patient Visual Report: No change from baseline       Perception     Praxis      Pertinent  Vitals/Pain Pain Assessment Pain Assessment: Faces Faces Pain Scale: No hurt     Hand Dominance Left   Extremity/Trunk Assessment Upper Extremity Assessment Upper Extremity Assessment: RUE deficits/detail RUE Deficits / Details: dense R hemiplegia, pt reports equal sensation B UEs RUE  Coordination: decreased fine motor;decreased gross motor       Cervical / Trunk Assessment Cervical / Trunk Assessment: Kyphotic   Communication Communication Communication: Expressive difficulties   Cognition Arousal/Alertness: Awake/alert Behavior During Therapy: WFL for tasks assessed/performed Overall Cognitive Status: Difficult to assess                                 General Comments: Pt following commands with increased time, able to offer accurate home set up.     General Comments       Exercises     Shoulder Instructions      Home Living Family/patient expects to be discharged to:: Private residence Living Arrangements: Alone Available Help at Discharge: Family;Available 24 hours/day Type of Home: House Home Access: Stairs to enter [employer] of Steps: 4 Entrance Stairs-Rails: Right;Left Home Layout: Two level Alternate Level Stairs-Number of Steps: flight Alternate Level Stairs-Rails: Right;Left Bathroom Shower/Tub: Teacher, early years/pre: Standard     Home Equipment: None   Additional Comments: has access to RW, BSC, shower seat      Prior Functioning/Environment Prior Level of Function : Independent/Modified Independent;Driving;Other (comment) (retired Regulatory affairs officer)                        OT Problem List: Impaired balance (sitting and/or standing);Decreased coordination;Impaired UE functional use;Decreased knowledge of use of DME or AE;Decreased strength      OT Treatment/Interventions: Self-care/ADL training;Manual therapy;Patient/family education;Balance training;Therapeutic activities;Neuromuscular education    OT Goals(Current goals can be found in the care plan section) Acute Rehab OT Goals OT Goal Formulation: With patient/family Time For Goal Achievement: 10/11/21 Potential to Achieve Goals: Good ADL Goals Pt Will Perform Eating: sitting;with supervision Pt Will Perform Grooming: with  set-up;sitting Pt Will Perform Upper Body Bathing: sitting;with min assist Pt Will Perform Upper Body Dressing: with min assist;sitting Pt Will Transfer to Toilet: with mod assist;stand pivot transfer;bedside commode Additional ADL Goal #1: Pt will demonstrate fair sitting balance x 10 minutes in preparation for ADL. Additional ADL Goal #2: Pt will perform bed mobility with min assist in preparation for ADL. Additional ADL Goal #3: Pt will protect R UE from injury during mobility.  OT Frequency: Min 3X/week    Co-evaluation PT/OT/SLP Co-Evaluation/Treatment: Yes Reason for Co-Treatment: For patient/therapist safety   OT goals addressed during session: Strengthening/ROM      AM-PAC OT "6 Clicks" Daily Activity     Outcome Measure Help from another person eating meals?: A Little Help from another person taking care of personal grooming?: A Little Help from another person toileting, which includes using toliet, bedpan, or urinal?: Total Help from another person bathing (including washing, rinsing, drying)?: A Lot Help from another person to put on and taking off regular upper body clothing?: A Lot Help from another person to put on and taking off regular lower body clothing?: Total 6 Click Score: 12   End of Session    Activity Tolerance: Patient tolerated treatment well Patient left: in bed;with call bell/phone within reach  OT Visit Diagnosis: Unsteadiness on feet (R26.81);Hemiplegia and hemiparesis Hemiplegia - Right/Left: Right Hemiplegia - dominant/non-dominant: Non-Dominant Hemiplegia -  caused by: Cerebral infarction                Time: 8307-3543 OT Time Calculation (min): 27 min Charges:  OT General Charges $OT Visit: 1 Visit OT Evaluation $OT Eval Moderate Complexity: 1 Mod  Nestor Lewandowsky, OTR/L Acute Rehabilitation Services Pager: [phone] Office: [phone]   Malka So 09/27/2021, 3:55 PM

## 2021-09-27 NOTE — ED Notes (Signed)
SLP at bedside assessing pt at this time.  

## 2021-09-27 NOTE — Progress Notes (Signed)
PT Cancellation Note  Patient Details Name: Sheryl [name] MRN: [id number] DOB: [birth year]   Cancelled Treatment:    Reason Eval/Treat Not Completed: Patient at procedure or test/unavailable.  Gone to MRI and will retry later.   Ramond Dial 09/27/2021, 10:54 AM  Mee Hives, PT PhD Acute Rehab Dept. Number: [place] [facility] [place]

## 2021-09-27 NOTE — Evaluation (Signed)
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   Osteoporosis 05/21/2020   Aortic atherosclerosis ([place]) 05/09/2020   GERD (gastroesophageal reflux disease) 04/12/2020   Anemia 04/01/2019   S/P shoulder replacement, 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   Vertigo 06/05/2018   Status post total knee replacement, 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   Stage 3a chronic kidney disease (CKD) ([place]) - baseline SCr 1.1-1.3 12/27/2016   Insomnia 06/29/2016   Prediabetes 06/29/2016   LGSIL (low grade squamous intraepithelial dysplasia) 12/08/2015   COPD (chronic obstructive pulmonary disease) ([place]) 12/09/2014   COPD exacerbation ([place]) 11/02/2014   Lumbago 07/16/2014   Bilateral hand pain 07/16/2014   HTN (hypertension) 06/04/2012   Hyperlipidemia 06/04/2012   Depression 06/04/2012   ADD (attention deficit disorder) 06/04/2012   AR (allergic rhinitis) 06/04/2012   Past Medical History:  Past Medical History:  Diagnosis Date   ADD (attention deficit disorder)    Allergy    Arthritis    CKD (chronic kidney disease), stage III ([place])    COPD (chronic obstructive pulmonary disease) ([place]) 12/09/2014   Depression    Does use hearing aid    Dyspnea    on exertion   Hyperlipidemia    Hypertension    LGSIL (low grade squamous intraepithelial dysplasia)    Osteoporosis    Pre-diabetes    Past Surgical History:  Past Surgical History:  Procedure Laterality Date   JOINT REPLACEMENT     right knee   MULTIPLE TOOTH EXTRACTIONS     REPLACEMENT TOTAL KNEE  2013   right   REVERSE SHOULDER ARTHROPLASTY Right 03/30/2019    Procedure: REVERSE TOTAL SHOULDER ARTHROPLASTY;  Surgeon: Netta Cedars, MD;  Location: [facility];  Service: Orthopedics;  Laterality: Right;  interscalene block   TOTAL KNEE ARTHROPLASTY Left 12/27/2017   TOTAL KNEE ARTHROPLASTY Left 12/27/2017   Procedure: LEFT TOTAL KNEE ARTHROPLASTY;  Surgeon: Netta Cedars, MD;  Location: [facility];  Service: Orthopedics;  Laterality: Left;   HPI:  Pt is a 71 year old woman found down by her daughter in her home. 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 PMH: COPD, current smoker, HTN, HLD, prediabetes, CKD3, ADD, L TKA, R reverse TSA, depression, hearing loss.   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 She was able to follow basic one-step verbal commands and answer basic yes/no biographical yes/no questions. She completed automatic speech tasks of counting and saying days of the week when SLP gave initial word cue. She named common objects but struggled with naming some objects when function given, and would perseverate at times. Speech intelligiblity was significantly impaired with patient's right sided facial flaccidity and decreased lingual ROM impacting her speech production. She was able to improve speech intelligibility when cued to talk louder and slower. SLP suspects patient would be a good candidate for acute inpatient rehab (AIR). SLP to continue to see patient acutely for speech, cognitive, language and swallow function.  SLP Assessment  SLP Recommendation/Assessment: Patient needs continued Speech Lanaguage Pathology Services SLP Visit Diagnosis: Cognitive communication deficit (R41.841);Dysarthria and anarthria (R47.1);Aphasia (R47.01)    Recommendations for follow up therapy are one component of a  multi-disciplinary discharge planning process, led by the attending physician.  Recommendations may be updated based on patient status, additional functional criteria and insurance authorization.    Follow Up Recommendations  Acute inpatient rehab (3hours/day)    Assistance Recommended at Discharge  Frequent or constant Supervision/Assistance  Functional Status Assessment Patient has had a recent decline in their functional status and demonstrates the ability to make significant improvements in function in a reasonable and predictable amount of time.  Frequency and Duration min 2x/week  2 weeks      SLP Evaluation Cognition  Overall Cognitive Status: Difficult to assess Arousal/Alertness: Awake/alert Orientation Level: Oriented to person;Oriented to place;Disoriented to time Year: Other (Comment) (did not state) Month: December Day of Week: Incorrect Attention: Sustained Sustained Attention: Appears intact Memory:  (memory not fully assessed secondary to dysarthria and aphasia) Awareness: Impaired Awareness Impairment: Intellectual impairment Problem Solving: Impaired Problem Solving Impairment: Verbal basic;Functional basic Behaviors: Perseveration Safety/Judgment: Impaired       Comprehension  Auditory Comprehension Overall Auditory Comprehension: Impaired Yes/No Questions: Impaired Basic Biographical Questions: 76-100% accurate Basic Immediate Environment Questions: 50-74% accurate Commands: Impaired (WFL at one-step basic) One Step Basic Commands: 75-100% accurate Conversation: Simple Interfering Components: Attention;Processing speed EffectiveTechniques: Extra processing time Visual Recognition/Discrimination Discrimination: Not tested Reading Comprehension Reading Status: Not tested    Expression Expression Primary Mode of Expression: Verbal Verbal Expression Overall Verbal Expression: Impaired Initiation: Impaired Automatic Speech: Name;Social  Response;Counting;Day of week Level of Generative/Spontaneous Verbalization: Word;Phrase Naming: Impairment Responsive: 51-75% accurate Confrontation: Impaired Convergent: Not tested Divergent: Not tested Verbal Errors: Perseveration;Not aware of errors;Semantic paraphasias Interfering Components: Attention Effective Techniques: Open ended questions;Semantic cues;Phonemic cues Non-Verbal Means of Communication: Not applicable Written Expression Dominant Hand: Left Written Expression: Not tested   Oral / Motor  Oral Motor/Sensory Function Overall Oral Motor/Sensory Function: Moderate impairment Facial ROM: Reduced right;Suspected CN VII (facial) dysfunction Facial Symmetry: Abnormal symmetry right;Suspected CN VII (facial) dysfunction Facial Strength: Reduced right;Suspected CN VII (facial) dysfunction Facial Sensation: Reduced right;Suspected CN V (Trigeminal) dysfunction Lingual ROM: Reduced right;Suspected CN XII (hypoglossal) dysfunction Lingual Symmetry: Abnormal symmetry right;Suspected CN XII (hypoglossal) dysfunction Lingual Strength: Reduced Motor Speech Overall Motor Speech: Impaired Respiration: Within functional limits Phonation: Low vocal intensity Resonance: Within functional limits Articulation: Impaired Level of Impairment: Word Intelligibility: Intelligibility reduced Word: 50-74% accurate Phrase: 25-49% accurate Sentence: Not tested Conversation: Not tested Motor Planning: Witnin functional limits Motor Speech Errors: Not applicable Effective Techniques: Slow rate;Increased vocal intensity;Over-articulate            Sonia Baller, MA, CCC-SLP Speech Therapy

## 2021-09-27 NOTE — ED Notes (Signed)
Pt transported to MRI via stretcher at this time.  °

## 2021-09-27 NOTE — Consult Note (Addendum)
Hospital Consult    Reason for Consult:  symptomatic carotid artery stenosis Requesting Physician:  Bridgett Larsson, MD MRN #:  [id number]  History of Present Illness: This is a 71 y.o. female who presented to the hospital last evening after being found down in her home with right sided weakness and slurred speech.  She was last known well on Sunday evening.  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  Local mass effect with effacement of the left lateral ventricle.  It also revealed ~ 80% left ICA stenosis and vascular surgery is consulted.   She did have CK of 1795 on admission and this is down to 832.  She can answer yes/no questions.  She is left handed and able to write.  She states she is a smoker.  She was not on asa prior to admission.  She has never had any stroke like issues prior to this specifically arm/leg weakness/numbness/paralysis, amaurosis fugax or speech issues.  She does not remember what happened.  She denies any cramping in her legs when she walks.  She is on medication for cholesterol.  She does take medication for blood pressure.    The pt is on a statin for cholesterol management.  The pt is not on a daily aspirin PTA but is currently.   Other AC:  none The pt is on CCB, ARB, diuretic for hypertension.   The pt is not diabetic.   Tobacco hx:  current  Past Medical History:  Diagnosis Date   ADD (attention deficit disorder)    Allergy    Arthritis    CKD (chronic kidney disease), stage III (HCC)    COPD (chronic obstructive pulmonary disease) ([place]) 12/09/2014   Depression    Does use hearing aid    Dyspnea    on exertion   Hyperlipidemia    Hypertension    LGSIL (low grade squamous intraepithelial dysplasia)    Osteoporosis    Pre-diabetes     Past Surgical History:  Procedure Laterality Date   JOINT REPLACEMENT     right knee   MULTIPLE TOOTH EXTRACTIONS      REPLACEMENT TOTAL KNEE  2013   right   REVERSE SHOULDER ARTHROPLASTY Right 03/30/2019   Procedure: REVERSE TOTAL SHOULDER ARTHROPLASTY;  Surgeon: Netta Cedars, MD;  Location: [facility];  Service: Orthopedics;  Laterality: Right;  interscalene block   TOTAL KNEE ARTHROPLASTY Left 12/27/2017   TOTAL KNEE ARTHROPLASTY Left 12/27/2017   Procedure: LEFT TOTAL KNEE ARTHROPLASTY;  Surgeon: Netta Cedars, MD;  Location: [facility];  Service: Orthopedics;  Laterality: Left;    Allergies  Allergen Reactions   Macrobid [Nitrofurantoin Monohyd Macro] Nausea And Vomiting    GI upset    Prior to Admission medications   Medication Sig Start Date End Date Taking? Authorizing Provider  albuterol (VENTOLIN HFA) 108 (90 Base) MCG/ACT inhaler Inhale 1-2 puffs into the lungs every 4 (four) hours as needed for wheezing or shortness of breath. 05/10/20  Yes Burns, Claudina Lick, MD  amLODipine (NORVASC) 5 MG tablet TAKE 1 TABLET BY MOUTH EVERY DAY Patient taking differently: Take 5 mg by mouth daily. 05/04/21  Yes Burns, Claudina Lick, MD  atomoxetine (STRATTERA) 100 MG capsule TAKE 1 CAPSULE BY MOUTH EVERY DAY Patient taking differently: Take 100 mg by mouth daily. 08/07/21  Yes Burns, Claudina Lick, MD  Budeson-Glycopyrrol-Formoterol (BREZTRI AEROSPHERE) 160-9-4.8 MCG/ACT AERO Inhale 2 puffs into  the lungs 2 (two) times daily. 05/30/21  Yes Burns, Claudina Lick, MD  cyclobenzaprine (FLEXERIL) 10 MG tablet Take 0.5-1 tablets (5-10 mg total) by mouth 3 (three) times daily as needed for muscle spasms. 04/17/21  Yes Burns, Claudina Lick, MD  FLUoxetine (PROZAC) 20 MG capsule TAKE 1 CAPSULE (20 MG TOTAL) BY MOUTH DAILY IN ADDITION TO 40 MG FOR A TOTAL OF 60 MG. Patient taking differently: Take 20 mg by mouth daily. Along with 40 mg to equal 60 mg 04/17/21  Yes Burns, Claudina Lick, MD  FLUoxetine (PROZAC) 40 MG capsule TAKE 1 CAPSULE BY MOUTH DAILY. IN ADDITION TO 20 MG FOR TOTAL OF 60 MG DAILY Patient taking differently: Take 40 mg by mouth daily. Along with  20 mg to equal 60 mg 09/14/21  Yes Burns, Claudina Lick, MD  fluticasone [facility]) 50 MCG/ACT nasal spray Place 1 spray into both nostrils daily as needed for allergies.   Yes [provider]  hydrochlorothiazide (HYDRODIURIL) 12.5 MG tablet TAKE 1 TABLET BY MOUTH EVERY DAY Patient taking differently: Take 12.5 mg by mouth daily. 07/03/21  Yes Burns, Claudina Lick, MD  losartan (COZAAR) 100 MG tablet TAKE 1 TABLET BY MOUTH EVERY DAY Patient taking differently: Take 100 mg by mouth daily. 09/11/21  Yes Burns, Claudina Lick, MD  meclizine (ANTIVERT) 25 MG tablet Take 1 tablet (25 mg total) by mouth 3 (three) times daily as needed for dizziness. 09/28/19  Yes Burns, Claudina Lick, MD  omeprazole (PRILOSEC) 20 MG capsule TAKE 1 CAPSULE BY MOUTH EVERY DAY Patient taking differently: Take 20 mg by mouth daily. 06/21/21  Yes Burns, Claudina Lick, MD  rOPINIRole (REQUIP) 0.25 MG tablet TAKE 1 TABLET BY MOUTH AT BEDTIME. Patient taking differently: Take 0.25 mg by mouth at bedtime. 06/12/21  Yes Burns, Claudina Lick, MD  rosuvastatin (CRESTOR) 20 MG tablet Take 1 tablet (20 mg total) by mouth daily. 04/17/21  Yes Burns, Claudina Lick, MD  traZODone (DESYREL) 50 MG tablet TAKE 1 TABLET BY MOUTH EVERYDAY AT BEDTIME Patient taking differently: Take 50 mg by mouth at bedtime. 09/14/21  Yes Burns, Claudina Lick, MD    Social History   Socioeconomic History   Marital status: Divorced    Spouse name: Not on file   Number of children: 2   Years of education: Not on file   Highest education level: Not on file  Occupational History   Not on file  Tobacco Use   Smoking status: Every Day    Packs/day: 0.25    Years: 49.00    Pack years: 12.25    Types: Cigarettes   Smokeless tobacco: Never   Tobacco comments:    Trying quit smoking!, resumed    Vaping Use   Vaping Use: Former  Substance and Sexual Activity   Alcohol use: No    Alcohol/week: 0.0 standard drinks   Drug use: No   Sexual activity: Not Currently  Other Topics Concern   Not on  file  Social History Narrative   Not on file   Social Determinants of Health   Financial Resource Strain: Not on file  Food Insecurity: Not on file  Transportation Needs: Not on file  Physical Activity: Not on file  Stress: Not on file  Social Connections: Not on file  Intimate Partner Violence: Not on file    Family History  Problem Relation Age of Onset   Hyperlipidemia Mother    Alcohol abuse Father    Arthritis Father    Colon polyps  Father    Alcohol abuse Maternal Uncle    Stroke Maternal Grandmother     ROS: [x]  Positive   [ ]  Negative   [ ]  All sytems reviewed and are negative Most ROS obtained from chart   Cardiac: [x]  HTN [x]  HLD  Vascular: Denies claudication  Pulmonary: [x]  COPD  Neurologic: [x]  hx of CVA    Hematologic: []  hx of cancer  Endocrine:   []  diabetes []  thyroid disease  GI []  GERD  GU: [x]  CKD III  Psychiatric: []  anxiety []  depression  Musculoskeletal: [x]  arthritis  Integumentary: []  rashes []  ulcers  Constitutional: []  fever  []  chills  Physical Examination  Vitals:   09/27/21 0600 09/27/21 0800  BP: 138/64 (!) 146/72  Pulse:  92  Resp: 18 18  Temp:  98.1 F (36.7 C)  SpO2: 100% 100%   There is no height or weight on file to calculate BMI.  General:  WDWN in NAD Gait: Not observed HENT: WNL, normocephalic Pulmonary: normal non-labored breathing Cardiac: regular,  without carotid bruits Abdomen:  soft, NT/ND; aortic pulse is not palpable Skin: without rashes; she does have abrasions on the right foot Vascular Exam/Pulses:  Right Left  Radial 2+ (normal) 2+ (normal)  Ulnar 2+ (normal) 2+ (normal)  Popliteal Unable to palpate Unable to palpate  DP 2+ (normal) 2+ (normal)   Extremities: without ischemic changes, without Gangrene , without cellulitis; without open wounds Musculoskeletal: no muscle wasting or atrophy  Neurologic: alert and able to answer yes/no questions; severe dysarthria.  Profound  right hemiplegia; able to wiggle right foot   CBC    Component Value Date/Time   WBC 14.8 (H) 09/26/2021 2030   RBC 3.75 (L) 09/26/2021 2030   HGB 10.3 (L) 09/26/2021 2030   HCT 34.4 (L) 09/26/2021 2030   PLT 400 09/26/2021 2030   MCV 91.7 09/26/2021 2030   MCV 89.9 02/14/2016 1411   MCH 27.5 09/26/2021 2030   MCHC 29.9 (L) 09/26/2021 2030   RDW 14.8 09/26/2021 2030   LYMPHSABS 1.4 09/26/2021 2030   MONOABS 1.0 09/26/2021 2030   EOSABS 0.0 09/26/2021 2030   BASOSABS 0.1 09/26/2021 2030    BMET    Component Value Date/Time   NA 143 09/26/2021 2030   NA 138 02/18/2017 1157   K 4.0 09/26/2021 2030   CL 108 09/26/2021 2030   CO2 17 (L) 09/26/2021 2030   GLUCOSE 101 (H) 09/26/2021 2030   BUN 32 (H) 09/26/2021 2030   BUN 14 02/18/2017 1157   CREATININE 1.24 (H) 09/26/2021 2030   CREATININE 1.32 (H) 04/12/2020 1434   CALCIUM 9.8 09/26/2021 2030   GFRNONAA 47 (L) 09/26/2021 2030   GFRAA >60 04/02/2019 0811    COAGS: Lab Results  Component Value Date   INR 1.1 09/26/2021   INR 2.03 (H) 02/27/2011   INR 2.70 (H) 02/26/2011     Non-Invasive Vascular Imaging:   CTA head/neck 09/26/2021: IMPRESSION: CT head:   Acute or early subacute infarct measuring 4.8 x 2.7 x 3.7 cm within the left corona radiata, left caudate and lentiform nuclei, left internal capsule and left external capsule. Local mass effect with partial effacement of the left lateral ventricle. No evidence of hemorrhagic conversion.   CTA neck:   1. The left common and internal carotid arteries are patent within the neck. Prominent atherosclerotic plaque about the left carotid bifurcation and within the proximal ICA. Exact quantification of stenosis at the origin of the left ICA is difficult due  to blooming artifact from calcified plaque at this site, but appears greater than 80%. Consider correlation with a carotid artery duplex ultrasound. 2. The right common and internal carotid arteries are patent  within the neck without hemodynamically significant stenosis. Mild atherosclerotic plaque within the right carotid system within the neck, as described. 3. Vertebral arteries patent within the neck without significant stenosis. 4. Aortic Atherosclerosis (ICD10-I70.0) and Emphysema (ICD10-J43.9).   CTA head:   1. 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 There is some reconstitution of enhancement within the M2 and more distal left MCA vessels. However, there is a superimposed severe stenosis versus focal occlusion within an inferior division left M2 MCA vessel. 2. Calcified plaque within the intracranial internal carotid arteries with no more than mild stenosis.   ASSESSMENT/PLAN: This is a 71 y.o. female admitted with symptomatic left ICA stenosis  -pt with severe right sided weakness and dysarthria in setting of left sided stroke.  CTA revealed that the left ICA is most likely >80% stenosis but difficult to accurately determine due to artifact and I have ordered carotid duplex for further evaluation. -Dr. Trula Slade will be in to see pt later today.  Most likely will need a couple of weeks of recovery prior to intervention.     Sheryl Locket, PA-C [facility] [phone]   I agree with the above.  Briefly this is a 71 year old female who presented to the emergency department yesterday after being found down at home with right-sided weakness and slurred speech.  Imaging revealed a large left brain stroke.  CT angiogram shows approximately left carotid 80% stenosis.  This was confirmed with duplex ultrasound.  MRI also shows a large infarct within the left corona radiata, basal ganglia, and internal capsule measuring approximately 5.5 x 3.7 x 2.7 cm.  She continues to have severe dysarthria and right-sided symptoms.  Medical management being directed by neurology and the hospitalist, to include dual antiplatelet therapy with aspirin and Plavix.   Statin therapy is ongoing with goal less than 70 LDL.  Blood pressure target between 130 and 160.  Because of the large size of her infarct as well as current significant symptoms, I would not recommend surgical intervention at this time.  I would like to give her at least a week or 2 to recover before considering intervention.  I do think that she would be a candidate for TCAR.  I will continue to follow the patient while she is in the hospital.  Annamarie Major

## 2021-09-27 NOTE — ED Notes (Signed)
Linens changed. Fresh chux pad placed under pt. Pt tolerated well.

## 2021-09-27 NOTE — Progress Notes (Signed)
STROKE TEAM PROGRESS NOTE   SUBJECTIVE (INTERVAL HISTORY) No family is at the bedside. [facility]VS NP arrived during round. Overall her condition is stable. Pt still has severe dysarthria, and right sided weakness, but seems following commands. [facility]VS on board.    OBJECTIVE CBC:  Recent Labs  Lab 09/26/21 2019 09/26/21 2030  WBC  --  14.8*  NEUTROABS  --  12.3*  HGB 10.9* 10.3*  HCT 32.0* 34.4*  MCV  --  91.7  PLT  --  540   Basic Metabolic Panel:  Recent Labs  Lab 09/26/21 2019 09/26/21 2030  NA 141 143  K 6.1* 4.0  CL 113* 108  CO2  --  17*  GLUCOSE 107* 101*  BUN 52* 32*  CREATININE 1.10* 1.24*  CALCIUM  --  9.8   Lipid Panel: No results for input(s): CHOL, TRIG, HDL, CHOLHDL, VLDL, LDLCALC in the last 168 hours. HgbA1c: No results for input(s): HGBA1C in the last 168 hours. Urine Drug Screen:  Recent Labs  Lab 09/26/21 2252  LABOPIA NONE DETECTED  COCAINSCRNUR NONE DETECTED  LABBENZ NONE DETECTED  AMPHETMU NONE DETECTED  THCU NONE DETECTED  LABBARB NONE DETECTED    Alcohol Level  Recent Labs  Lab 09/26/21 2030  ETH <10    IMAGING past 24 hours CT ANGIO HEAD NECK W WO CM  Result Date: 09/26/2021 CLINICAL DATA:  Provided history: Neuro deficit, acute, stroke suspected. Additional history provided: Patient from home after daughter found patient on ground, last known well Sunday night, stroke-like symptoms, right arm weakness, slurred speech. EXAM: CT ANGIOGRAPHY HEAD AND NECK TECHNIQUE: Multidetector CT imaging of the head and neck was performed using the standard protocol during bolus administration of intravenous contrast. Multiplanar CT image reconstructions and MIPs were obtained to evaluate the vascular anatomy. Carotid stenosis measurements (when applicable) are obtained utilizing NASCET criteria, using the distal internal carotid diameter as the denominator. RADIATION DOSE REDUCTION: This exam was performed according to the departmental dose-optimization program  which includes automated exposure control, adjustment of the mA and/or kV according to patient size and/or use of iterative reconstruction technique. CONTRAST:  68mL OMNIPAQUE IOHEXOL 350 MG/ML SOLN COMPARISON:  None. FINDINGS: CT HEAD FINDINGS Brain: Cerebral volume appears normal for age. Acute or early subacute infarct measuring 4.8 x 2.7 x 3.7 cm within the left corona radiata, left caudate and lentiform nuclei, left internal capsule and left external capsule. Mild local mass effect with partial effacement of the left lateral ventricle. No midline shift. No evidence of hemorrhagic conversion. No demarcated cortical infarct. No extra-axial fluid collection. No evidence of an intracranial mass. No midline shift. Vascular: No hyperdense vessel.  Atherosclerotic calcifications. Skull: Normal. Negative for fracture or focal lesion. Sinuses: Visualized orbits show no acute finding. Orbits: No orbital mass or acute orbital finding. Review of the MIP images confirms the above findings CTA NECK FINDINGS Aortic arch: Standard aortic branching. Atherosclerotic plaque within the visualized aortic arch and proximal major branch vessels of the neck. Motion artifact limits evaluation of the right subclavian artery. Streak and beam hardening artifact arising from a dense left-sided contrast bolus limits evaluation of the left subclavian artery. Within this limitation, no definite hemodynamically significant stenosis is identified within the innominate or proximal subclavian arteries. Right carotid system: CCA and ICA patent within the neck without significant stenosis (50% or greater). Mild atherosclerotic plaque about the carotid bifurcation and within the proximal ICA. Left carotid system: CCA and ICA patent within the neck. Soft calcified plaque about the  carotid bifurcation and within the proximal ICA. Stenosis at the origin of the left ICA which is difficult to accurately quantify due to blooming artifact from calcified  plaque, but appears greater than 80%. Distal to this, the left ICA is asymmetrically diminutive within the neck as compared to the right ICA. Vertebral arteries: Vertebral arteries patent within the neck without significant stenosis. Skeleton: Please refer to the separately reported CT of the cervical spine for cervical spine findings. There, no acute bony abnormality or aggressive osseous lesion is identified. Other neck: No neck mass or cervical lymphadenopathy. Subcentimeter calcified nodule within the left thyroid lobe not meeting consensus criteria for ultrasound follow-up based on size. Upper chest: No consolidation within the imaged lung apices. Centrilobular emphysema. Review of the MIP images confirms the above findings CTA HEAD FINDINGS Anterior circulation: The intracranial internal carotid arteries are patent. Calcified plaque within both vessels with no more than mild stenosis. The left M1 middle cerebral artery is occluded at its origin. There is some reconstitution of enhancement within the M2 and more distal left MCA vessels. Over, there is a severe stenosis versus focal occlusion within an inferior division proximal M2 left MCA vessel (series 14, image 25). The M1 right MCA is patent. No right M2 proximal branch occlusion or high-grade proximal stenosis. The anterior cerebral arteries are patent. No intracranial aneurysm is identified. Posterior circulation: The intracranial vertebral arteries are patent. The basilar artery is patent. The posterior cerebral arteries are patent. Predominantly fetal origin of the right posterior cerebral artery. A sizable left posterior communicating artery is present. Venous sinuses: Within the limitations of contrast timing, no convincing thrombus. Anatomic variants: As described. Review of the MIP images confirms the above findings These results were called by telephone at the time of interpretation on 09/26/2021 at 9:34 pm to provider Davonna Belling , who verbally  acknowledged these results. CT head and CTA head impression #1 called by telephone at the time of interpretation on 09/26/2021 at 9:55 pm to provider Davonna Belling , who verbally acknowledged these results. IMPRESSION: CT head: Acute or early subacute infarct measuring 4.8 x 2.7 x 3.7 cm within the left corona radiata, left caudate and lentiform nuclei, left internal capsule and left external capsule. Local mass effect with partial effacement of the left lateral ventricle. No evidence of hemorrhagic conversion. CTA neck: 1. The left common and internal carotid arteries are patent within the neck. Prominent atherosclerotic plaque about the left carotid bifurcation and within the proximal ICA. Exact quantification of stenosis at the origin of the left ICA is difficult due to blooming artifact from calcified plaque at this site, but appears greater than 80%. Consider correlation with a carotid artery duplex ultrasound. 2. The right common and internal carotid arteries are patent within the neck without hemodynamically significant stenosis. Mild atherosclerotic plaque within the right carotid system within the neck, as described. 3. Vertebral arteries patent within the neck without significant stenosis. 4. Aortic Atherosclerosis (ICD10-I70.0) and Emphysema (ICD10-J43.9). CTA head: 1. 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 There is some reconstitution of enhancement within the M2 and more distal left MCA vessels. However, there is a superimposed severe stenosis versus focal occlusion within an inferior division left M2 MCA vessel. 2. Calcified plaque within the intracranial internal carotid arteries with no more than mild stenosis. Electronically Signed   By: Kellie Simmering D.O.   On: 09/26/2021 21:56   DG Abd 1 View  Result Date: 09/27/2021 CLINICAL DATA:  MRI  screening EXAM: ABDOMEN - 1 VIEW COMPARISON:  None. FINDINGS: Unremarkable bowel gas pattern. There is residual contrast within the  bladder. No radiopaque foreign body. IMPRESSION: No radiopaque foreign body. Electronically Signed   By: Macy Mis M.D.   On: 09/27/2021 11:31   MR BRAIN WO CONTRAST  Result Date: 09/27/2021 CLINICAL DATA:  Stroke, follow-up. Additional history provided: Provided history: EXAM: MRI HEAD WITHOUT CONTRAST TECHNIQUE: Multiplanar, multiecho pulse sequences of the brain and surrounding structures were obtained without intravenous contrast. COMPARISON:  CT angiogram head/neck 09/26/2021. FINDINGS: Brain: Mild intermittent motion degradation. Redemonstrated acute/early subacute infarct within the left corona radiata, left caudate and lentiform nuclei, left internal capsule and left external capsule. This infarct has slightly increased in size since the CT angiogram head/neck of 09/26/2021, now measuring 5.5 x 2.7 x 3.7 cm (previously 4.8 x 2.7 x 3.7 cm). As before, there is local mass effect with partial effacement of the left lateral ventricle. No midline shift or evidence of hemorrhagic conversion. Additional small acute/early subacute infarct within the anteromedial left temporal lobe (in retrospect likely present on the prior CTA head of 09/26/2021). There are several additional small acute early/subacute cortically based infarcts within the left temporal, parietal and frontal lobes (left MCA vascular territory). No evidence of an intracranial mass. No extra-axial fluid collection. 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 A severe stenosis versus focal occlusion was also noted within an inferior division proximal M2 left MCA vessel on this prior exam. Skull and upper cervical spine: No focal suspicious marrow lesion. Sinuses/Orbits: Visualized orbits show no acute finding. Bilateral ocular lens replacements. IMPRESSION: 5.5 x 2.7 x 3.7 cm acute/early subacute infarct within the left corona radiata, basal ganglia, internal capsule  and external capsule. This infarct has slightly increased in size since the CTA head/neck of 09/26/2021. As before, there is local mass effect with partial effacement of the left lateral ventricle. No midline shift or evidence of hemorrhagic conversion. Multiple additional small acute/early subacute cortically-based infarcts within the left frontal, parietal and temporal lobes (left MCA vascular territory). Electronically Signed   By: Kellie Simmering D.O.   On: 09/27/2021 12:18   CT C-SPINE NO CHARGE  Result Date: 09/26/2021 CLINICAL DATA:  Found on floor. EXAM: CT CERVICAL SPINE WITHOUT CONTRAST TECHNIQUE: Multidetector CT imaging of the cervical spine was performed without intravenous contrast. Multiplanar CT image reconstructions were also generated. RADIATION DOSE REDUCTION: This exam was performed according to the departmental dose-optimization program which includes automated exposure control, adjustment of the mA and/or kV according to patient size and/or use of iterative reconstruction technique. COMPARISON:  None. FINDINGS: Alignment: Normal Skull base and vertebrae: No acute fracture. No primary bone lesion or focal pathologic process. Soft tissues and spinal canal: No prevertebral fluid or swelling. No visible canal hematoma. Disc levels: Advanced diffuse degenerative facet disease bilaterally. Upper chest: Emphysema.  No acute abnormality. Other: None IMPRESSION: Advanced diffuse degenerative facet disease. No acute bony abnormality. Electronically Signed   By: Rolm Baptise M.D.   On: 09/26/2021 21:30   DG Chest Portable 1 View  Result Date: 09/26/2021 CLINICAL DATA:  Fall EXAM: PORTABLE CHEST 1 VIEW COMPARISON:  06/02/2021 FINDINGS: Right shoulder replacement. Borderline to mild cardiomegaly. No focal opacity or pleural effusion. No pneumothorax IMPRESSION: No active disease.  Borderline cardiomegaly Electronically Signed   By: Donavan Foil M.D.   On: 09/26/2021 20:57   VAS US CAROTID  Result  Date: 09/27/2021  Carotid Arterial Duplex Study Patient Name:  Sheryl [name]  Date of Exam:   09/27/2021 Medical Rec #: [id number]        Accession #:    [id number] Date of Birth: [birth year]       Patient Gender: F Patient Age:   33 years Exam Location:  [facility] Procedure:      VAS US CAROTID Referring Phys: Aldona Bar RHYNE --------------------------------------------------------------------------------  Risk Factors:      Hypertension, hyperlipidemia. Comparison Study:  09/26/2021 - CTA neck:                     1. The left common and internal carotid arteries are patent                    within                    the neck. Prominent atherosclerotic plaque about the left                    carotid                    bifurcation and within the proximal ICA. Exact quantification                    of                    stenosis at the origin of the left ICA is difficult due to                    blooming                    artifact from calcified plaque at this site, but appears                    greater                    than 80%. Consider correlation with a carotid artery duplex                    ultrasound.                    2. The right common and internal carotid arteries are patent                    within                    the neck without hemodynamically significant stenosis. Mild                    atherosclerotic plaque within the right carotid system within                    the                    neck, as described.                    3. Vertebral arteries patent within the neck without                    significant                    stenosis.  4. Aortic Atherosclerosis (ICD10-I70.0) and Emphysema                    (ICD10-J43.9). Performing Technologist: Oliver Hum RVT  Examination Guidelines: A complete evaluation includes B-mode imaging, spectral Doppler, color Doppler, and power Doppler as needed of all accessible portions of each vessel. Bilateral testing is  considered an integral part of a complete examination. Limited examinations for reoccurring indications may be performed as noted.  Right Carotid Findings: +----------+--------+--------+--------+-----------------------+--------+             PSV cm/s EDV cm/s Stenosis Plaque Description      Comments  +----------+--------+--------+--------+-----------------------+--------+  CCA Prox   90       19                smooth and heterogenous           +----------+--------+--------+--------+-----------------------+--------+  CCA Distal 73       21                smooth and heterogenous           +----------+--------+--------+--------+-----------------------+--------+  ICA Prox   74       22                calcific                          +----------+--------+--------+--------+-----------------------+--------+  ICA Distal 111      38                                        tortuous  +----------+--------+--------+--------+-----------------------+--------+  ECA        97       12                                                  +----------+--------+--------+--------+-----------------------+--------+ +----------+--------+-------+--------+-------------------+             PSV cm/s EDV cms Describe Arm Pressure (mmHG)  +----------+--------+-------+--------+-------------------+  Subclavian 150                                            +----------+--------+-------+--------+-------------------+ +---------+--------+--+--------+--+---------+  Vertebral PSV cm/s 88 EDV cm/s 17 Antegrade  +---------+--------+--+--------+--+---------+  Left Carotid Findings: +----------+-------+-------+--------+---------------------------------+--------+             PSV     EDV     Stenosis Plaque Description                Comments              cm/s    cm/s                                                         +----------+-------+-------+--------+---------------------------------+--------+  CCA Prox   71      10               smooth and heterogenous                      +----------+-------+-------+--------+---------------------------------+--------+  CCA Distal 55      5                smooth and heterogenous                     +----------+-------+-------+--------+---------------------------------+--------+  ICA Prox   413     127     80-99%   calcific, irregular and                                                          heterogenous                                +----------+-------+-------+--------+---------------------------------+--------+  ICA Mid    157     33                                                           +----------+-------+-------+--------+---------------------------------+--------+  ICA Distal 63      19                                                 tortuous  +----------+-------+-------+--------+---------------------------------+--------+  ECA        166     18                                                           +----------+-------+-------+--------+---------------------------------+--------+ +----------+--------+--------+--------+-------------------+             PSV cm/s EDV cm/s Describe Arm Pressure (mmHG)  +----------+--------+--------+--------+-------------------+  Subclavian 134                                             +----------+--------+--------+--------+-------------------+ +---------+--------+--+--------+--+---------+  Vertebral PSV cm/s 73 EDV cm/s 17 Antegrade  +---------+--------+--+--------+--+---------+   Summary: Right Carotid: Velocities in the right ICA are consistent with a 1-39% stenosis. Left Carotid: Velocities in the left ICA are consistent with a 80-99% stenosis. Vertebrals: Bilateral vertebral arteries demonstrate antegrade flow. *See table(s) above for measurements and observations.     Preliminary      PHYSICAL EXAM Temp:  [98.1 F (36.7 C)-98.4 F (36.9 C)] 98.1 F (36.7 C) (02/08 0800) Pulse Rate:  [73-122] 89 (02/08 1300) Resp:  [16-26] 20 (02/08 1300) BP: (118-146)/(46-80) 145/54 (02/08  1300) SpO2:  [93 %-100 %] 98 % (02/08 1300)  General - Well nourished, well developed, in no apparent distress.  Cardiovascular - Regular rhythm and rate.  Neuro - awake, alert, eyes open, severe dysarthria with intangible words, but seems to orientated to place with choices. Able to follow most simple commands. Not able to name or repeat due to near anarthria.  No gaze palsy, tracking bilaterally, blinking to visual threat bilaterally. right facial droop. Tongue protrusion to the right. LUE at least 4/5, RUE flaccid. LLE at least 3/5 and RLE 1+/5 proximal and 0/5 distal. Sensation not quite consistent but seems to have decreased sensation on the right, left FTN grossly intact but slow, gait not tested.    ASSESSMENT/PLAN Sheryl [name] is a 71 y.o. female with history of CKD Stage III, HLD, HTN, pre-diabetes, COPD, depression, and LGSIL presenting with R-sided weakness and dysarthria after being found down at home..   Stroke:  left large BG/CR infarct likely secondary large vessel source with left ICA high grade stenosis Code Stroke CT head subacute ischemic infarct of L corona radiata, L caudate and lentiform nuclei, and L internal and external capsules CTA head & neck left ICA 80% stenosis, left M1 occlusion with M2 branch reconstitution with possible inferior left M2 stenosis/short segment occlusion. MRI large left BG/CR infarct Carotid Doppler left ICA 80 to 99% stenosis 2D Echo pending LDL pending HgbA1c pending UDS negative VTE prophylaxis - Lovenox No antithrombotic prior to admission, now on aspirin 325 mg daily and Plavix DAPT.  Therapy recommendations:  Pending PT/OT Disposition:  Pending PT/OT  Carotid stenosis CTA head & neck left ICA 80% stenosis Carotid Doppler left ICA 80 to 99% stenosis VVS on board  Hypertension Home meds:  Norvasc 5 mg, Losartan 100 mg Stable BP goal 130-160 until carotid revascularization Long-term BP goal  normotensive  Hyperlipidemia Home meds:  Rosuvastatin 20 mg daily, resumed in hospital LDL pending, goal < 70 Continue statin at discharge  Dysphagia Passes swallow On dysphagia 1 and nectar thick liquid Gentle IV hydration  Tobacco abuse Current smoker Smoking cessation counseling provided Nicotine patch provided Pt is willing to quit  Other Stroke Risk Factors Advanced Age >/= 93  Family hx stroke (Maternal grandmother)  Other Active Problems CK trending down [id number]  Hospital day # 1  Rosalin Hawking, MD PhD Stroke Neurology 09/27/2021 3:22 PM     To contact Stroke Continuity provider, please refer to [email or web address] After hours, contact General Neurology

## 2021-09-27 NOTE — Evaluation (Signed)
Physical Therapy Evaluation Patient Details Name: Sheryl [name] MRN: [id number] DOB: 1951/06/08 Today's Date: 09/27/2021  History of Present Illness  Pt is a 71 year old woman found down by her daughter in her home, admitted on 2/7. 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 PMH: COPD, current smoker, HTN, HLD, prediabetes, CKD3, ADD, L TKA, R reverse TSA, depression, hearling loss.  Clinical Impression  Pt was seen with OT for mobility due to need for assistance to support both sitting balance and to control standing on side of bed.  Pt is on a gurney, very unsafe to attempt standing with one person due to her height and density of stroke weakness.  Pt is appropriate for CIR due to her PLOF and family support to follow her home after rehab.  Pt was fully independent with self care, management of home and with her hobbies.  Pt is motivated to work despite being extremely tired from her day including a barium swallow test.  Follow for acute PT goals as ordered and focus on her standing support on RLE, sitting midline control and her progression to being OOB in chair with strengthening on RLE.  CIR is the current recommendation from PT and OT.       Recommendations for follow up therapy are one component of a multi-disciplinary discharge planning process, led by the attending physician.  Recommendations may be updated based on patient status, additional functional criteria and insurance authorization.  Follow Up Recommendations Acute inpatient rehab (3hours/day)    Assistance Recommended at Discharge Frequent or constant Supervision/Assistance  Patient can return home with the following  Two people to help with walking and/or transfers;Two people to help with bathing/dressing/bathroom;Assistance with cooking/housework;Assist for transportation;Help with stairs or ramp for entrance    Equipment  Recommendations None recommended by PT (defer to CIR)  Recommendations for Other Services  Rehab consult    Functional Status Assessment Patient has had a recent decline in their functional status and demonstrates the ability to make significant improvements in function in a reasonable and predictable amount of time.     Precautions / Restrictions Precautions Precautions: Fall Precaution Comments: R hemiparesis Restrictions Weight Bearing Restrictions: No Other Position/Activity Restrictions: R LE weakness with all mobility      Mobility  Bed Mobility Overal bed mobility: Needs Assistance Bed Mobility: Supine to Sit, Sit to Supine     Supine to sit: Mod assist Sit to supine: Mod assist   General bed mobility comments: pt tends to fall back and to R side quickly, min assist to maintain sit balance    Transfers Overall transfer level: Needs assistance Equipment used: 2 person hand held assist Transfers: Sit to/from Stand Sit to Stand: +2 physical assistance, Mod assist           General transfer comment: used platform with L UE hand hold due to height to floor with gurney    Ambulation/Gait               General Gait Details: unable to take steps  Stairs            Wheelchair Mobility    Modified Rankin (Stroke Patients Only) Modified Rankin (Stroke Patients Only) Pre-Morbid Rankin Score: No symptoms Modified Rankin: Severe disability     Balance Overall balance assessment: Needs assistance   Sitting balance-Leahy Scale: Poor   Postural control: Posterior lean, Right lateral  lean Standing balance support: Bilateral upper extremity supported, During functional activity Standing balance-Leahy Scale: Poor Standing balance comment: Pt used grip on LUE and PT supported RUE                             Pertinent Vitals/Pain Pain Assessment Pain Assessment: No/denies pain    Home Living Family/patient expects to be discharged to::  Private residence Living Arrangements: Alone Available Help at Discharge: Family;Available 24 hours/day Type of Home: House Home Access: Stairs to enter Entrance Stairs-Rails: Psychiatric nurse of Steps: 4 Alternate Level Stairs-Number of Steps: flight Home Layout: Two level Home Equipment: None Additional Comments: family may have RW, BSC and shower chair    Prior Function Prior Level of Function : Independent/Modified Independent;Driving;Other (comment)             Mobility Comments: no AD needed, all movement indepedent       Hand Dominance   Dominant Hand: Left    Extremity/Trunk Assessment   Upper Extremity Assessment Upper Extremity Assessment: Defer to OT evaluation    Lower Extremity Assessment Lower Extremity Assessment: RLE deficits/detail RLE Deficits / Details: RLE weak, no active DF noted but active weak hip and knee movement RLE Coordination: decreased gross motor    Cervical / Trunk Assessment Cervical / Trunk Assessment: Kyphotic (mild)  Communication   Communication: Expressive difficulties  Cognition Arousal/Alertness: Awake/alert, Lethargic Behavior During Therapy: WFL for tasks assessed/performed Overall Cognitive Status: Difficult to assess                                 General Comments: Pt can give history but is also being supplemented by familyy        General Comments General comments (skin integrity, edema, etc.): Pt is demonstrating some volutary control of R LE and requires support on RUE to stand and balance.  Supported R knee to stand initially but then pt began to extend and support on RLE, too weak for steps    Exercises     Assessment/Plan    PT Assessment Patient needs continued PT services  PT Problem List Decreased strength;Decreased activity tolerance;Decreased balance;Decreased mobility;Decreased coordination;Decreased knowledge of use of DME       PT Treatment Interventions DME  instruction;Gait training;Functional mobility training;Therapeutic activities;Therapeutic exercise;Balance training;Neuromuscular re-education;Patient/family education    PT Goals (Current goals can be found in the Care Plan section)  Acute Rehab PT Goals Patient Stated Goal: none stated PT Goal Formulation: With patient/family Time For Goal Achievement: 10/11/21 Potential to Achieve Goals: Good    Frequency Min 4X/week     Co-evaluation PT/OT/SLP Co-Evaluation/Treatment: Yes Reason for Co-Treatment: For patient/therapist safety;To address functional/ADL transfers PT goals addressed during session: Balance;Mobility/safety with mobility         AM-PAC PT "6 Clicks" Mobility  Outcome Measure Help needed turning from your back to your side while in a flat bed without using bedrails?: A Lot Help needed moving from lying on your back to sitting on the side of a flat bed without using bedrails?: Total Help needed moving to and from a bed to a chair (including a wheelchair)?: Total Help needed standing up from a chair using your arms (e.g., wheelchair or bedside chair)?: Total Help needed to walk in hospital room?: Total Help needed climbing 3-5 steps with a railing? : Total 6 Click Score: 7    End of  Session   Activity Tolerance: Patient limited by fatigue;Treatment limited secondary to medical complications (Comment) Patient left: in bed;with call bell/phone within reach;with family/visitor present Nurse Communication: Mobility status;Other (comment) (R hemiparesis) PT Visit Diagnosis: Unsteadiness on feet (R26.81);Muscle weakness (generalized) (M62.81);Hemiplegia and hemiparesis Hemiplegia - Right/Left: Right Hemiplegia - dominant/non-dominant: Dominant Hemiplegia - caused by: Cerebral infarction    Time: 1425-1452 PT Time Calculation (min) (ACUTE ONLY): 27 min   Charges:   PT Evaluation $PT Eval Moderate Complexity: 1 Mod         Ramond Dial 09/27/2021, 7:31  PM  Mee Hives, PT PhD Acute Rehab Dept. Number: [place] [facility] [place]

## 2021-09-27 NOTE — ED Notes (Signed)
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 Secure chat sent to MD.

## 2021-09-27 NOTE — ED Notes (Signed)
Pt transported back to room from MRI at this time. Pt hooked back up to ED cardiac monitor. Daughter updated on pt status. Vascular at bedside for carotid US.

## 2021-09-27 NOTE — Progress Notes (Signed)
Modified Barium Swallow Progress Note  Patient Details  Name: Sheryl [name] MRN: [id number] Date of Birth: [birth year]  Today's Date: 09/27/2021  Modified Barium Swallow completed.  Full report located under Chart Review in the Imaging Section.  Brief recommendations include the following:  Clinical Impression  Patient presents with a mild-moderate oropharyngeal dysphagia as per this MBS. During oral phase, patient exhibited decrased anterior to posterior transit of boluses which caused moderate delay with puree solids and mild delay with liquids. During pharyngeal phase, swallow was initiated at level of pyriform sinus with thin liquids and nectar thick liquids and at level of vallecular sinus with puree solids. Patient exhibited trace silent aspiration during the swallow with thin liquids and trace penetration with nectar thick liquids. Retention of barium observed in vallecular sinus with all tested consistencies (trace to mild with puree solids, trace with nectar and thin liquids). SLP is recommending to initiate Dys 1 (puree) solids and nectar thick liquids diet at this time.   Swallow Evaluation Recommendations       SLP Diet Recommendations: Dysphagia 1 (Puree) solids;Nectar thick liquid   Liquid Administration via: Cup   Medication Administration: Crushed with puree   Supervision: Full supervision/cueing for compensatory strategies;Staff to assist with self feeding   Compensations: Slow rate;Small sips/bites;Follow solids with liquid   Postural Changes: Seated upright at 90 degrees   Oral Care Recommendations: Oral care BID;Staff/trained caregiver to provide oral care;Oral care before and after PO   Other Recommendations: Order thickener from pharmacy;Prohibited food (jello, ice cream, thin soups);Clarify dietary restrictions;Remove water pitcher;Have oral suction available    Sonia Baller, MA, CCC-SLP Speech Therapy

## 2021-09-27 NOTE — Evaluation (Signed)
Clinical/Bedside Swallow Evaluation Patient Details  Name: UCHECHI DENISON MRN: [id number] Date of Birth: [birth year]  Today's Date: 09/27/2021 Time: SLP Start Time (ACUTE ONLY): 57 SLP Stop Time (ACUTE ONLY): 7253 SLP Time Calculation (min) (ACUTE ONLY): 15 min  Past Medical History:  Past Medical History:  Diagnosis Date   ADD (attention deficit disorder)    Allergy    Arthritis    CKD (chronic kidney disease), stage III (HCC)    COPD (chronic obstructive pulmonary disease) ([place]) 12/09/2014   Depression    Does use hearing aid    Dyspnea    on exertion   Hyperlipidemia    Hypertension    LGSIL (low grade squamous intraepithelial dysplasia)    Osteoporosis    Pre-diabetes    Past Surgical History:  Past Surgical History:  Procedure Laterality Date   JOINT REPLACEMENT     right knee   MULTIPLE TOOTH EXTRACTIONS     REPLACEMENT TOTAL KNEE  2013   right   REVERSE SHOULDER ARTHROPLASTY Right 03/30/2019   Procedure: REVERSE TOTAL SHOULDER ARTHROPLASTY;  Surgeon: Netta Cedars, MD;  Location: [facility];  Service: Orthopedics;  Laterality: Right;  interscalene block   TOTAL KNEE ARTHROPLASTY Left 12/27/2017   TOTAL KNEE ARTHROPLASTY Left 12/27/2017   Procedure: LEFT TOTAL KNEE ARTHROPLASTY;  Surgeon: Netta Cedars, MD;  Location: [place];  Service: Orthopedics;  Laterality: Left;   HPI:  Pt is a 71 year old woman found down by her daughter in her home. 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 PMH: COPD, current smoker, HTN, HLD, prediabetes, CKD3, ADD, L TKA, R reverse TSA, depression, hearing loss.    Assessment / Plan / Recommendation  Clinical Impression  Patient presents with clinical s/s of dysphagia as per this bedside swallow evaluation. She exhibited delayed oral transit with liquids and solids, anterior spillage with liquids, suspected delayed swallow initiation and  discoordination of swallow. Patient with immediate cough after small sips of water, delayed cough with nectar thick liquids. At this time, SLP is recommending to continue NPO and will pursue objective swallow study (MBS) prior to recommending any PO's. SLP Visit Diagnosis: Dysphagia, unspecified (R13.10)    Aspiration Risk  Mild aspiration risk;Moderate aspiration risk    Diet Recommendation NPO   Medication Administration: Via alternative means    Other  Recommendations      Recommendations for follow up therapy are one component of a multi-disciplinary discharge planning process, led by the attending physician.  Recommendations may be updated based on patient status, additional functional criteria and insurance authorization.  Follow up Recommendations Acute inpatient rehab (3hours/day)      Assistance Recommended at Discharge Frequent or constant Supervision/Assistance  Functional Status Assessment Patient has had a recent decline in their functional status and demonstrates the ability to make significant improvements in function in a reasonable and predictable amount of time.  Frequency and Duration min 2x/week  1 week       Prognosis Prognosis for Safe Diet Advancement: Good      Swallow Study   General Date of Onset: 09/26/21 HPI: Pt is a 71 year old woman found down by her daughter in her home. 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 PMH: COPD, current smoker, HTN, HLD, prediabetes, CKD3, ADD, L TKA, R reverse  TSA, depression, hearing loss. Type of Study: Bedside Swallow Evaluation Previous Swallow Assessment: none found Diet Prior to this Study: NPO Temperature Spikes Noted: No Respiratory Status: Room air History of Recent Intubation: No Behavior/Cognition: Alert;Pleasant mood;Cooperative;Requires cueing Oral Cavity Assessment: Dry Oral Care Completed by SLP:  Yes Oral Cavity - Dentition: Dentures, top;Missing dentition Self-Feeding Abilities: Needs assist;Needs set up Patient Positioning: Upright in bed Baseline Vocal Quality: Low vocal intensity Volitional Cough: Congested Volitional Swallow: Unable to elicit    Oral/Motor/Sensory Function Overall Oral Motor/Sensory Function: Moderate impairment Facial ROM: Reduced right;Suspected CN VII (facial) dysfunction Facial Symmetry: Abnormal symmetry right;Suspected CN VII (facial) dysfunction Facial Strength: Reduced right;Suspected CN VII (facial) dysfunction Facial Sensation: Reduced right;Suspected CN V (Trigeminal) dysfunction Lingual ROM: Reduced right;Suspected CN XII (hypoglossal) dysfunction Lingual Symmetry: Abnormal symmetry right;Suspected CN XII (hypoglossal) dysfunction Lingual Strength: Reduced   Ice Chips     Thin Liquid Thin Liquid: Impaired Presentation: Cup Oral Phase Impairments: Poor awareness of bolus;Reduced lingual movement/coordination;Reduced labial seal Pharyngeal  Phase Impairments: Cough - Immediate    Nectar Thick Nectar Thick Liquid: Impaired Presentation: Cup Oral Phase Impairments: Reduced labial seal;Reduced lingual movement/coordination;Poor awareness of bolus Pharyngeal Phase Impairments: Suspected delayed Swallow;Cough - Delayed   Honey Thick     Puree Puree: Impaired Presentation: Spoon Oral Phase Impairments: Poor awareness of bolus;Reduced lingual movement/coordination Oral Phase Functional Implications: Prolonged oral transit Pharyngeal Phase Impairments: Suspected delayed Swallow   Solid     Solid: Not tested     Sonia Baller, MA, CCC-SLP Speech Therapy

## 2021-09-28 ENCOUNTER — Inpatient Hospital Stay (HOSPITAL_COMMUNITY): Payer: Medicare PPO

## 2021-09-28 DIAGNOSIS — I63512 Cerebral infarction due to unspecified occlusion or stenosis of left middle cerebral artery: Secondary | ICD-10-CM | POA: Diagnosis not present

## 2021-09-28 DIAGNOSIS — I10 Essential (primary) hypertension: Secondary | ICD-10-CM

## 2021-09-28 LAB — ECHOCARDIOGRAM COMPLETE
AR max vel: 2 cm2
AV Area VTI: 2.45 cm2
AV Area mean vel: 2.33 cm2
AV Mean grad: 3 mmHg
AV Peak grad: 6.6 mmHg
Ao pk vel: 1.28 m/s
Area-P 1/2: 4.17 cm2
Height: 61 in
S' Lateral: 2.5 cm
Weight: 2264.57 oz

## 2021-09-28 MED ORDER — ROSUVASTATIN CALCIUM 20 MG PO TABS
40.0000 mg | ORAL_TABLET | Freq: Every day | ORAL | Status: DC
  Administered 2021-09-29 – 2021-10-02 (×4): 40 mg via ORAL
  Filled 2021-09-28 (×4): qty 2

## 2021-09-28 MED ORDER — ORAL CARE MOUTH RINSE
15.0000 mL | Freq: Two times a day (BID) | OROMUCOSAL | Status: DC
  Administered 2021-09-28 – 2021-10-02 (×8): 15 mL via OROMUCOSAL

## 2021-09-28 NOTE — Assessment & Plan Note (Signed)
permissive hypertension up to 220/110 mmhg.

## 2021-09-28 NOTE — Progress Notes (Signed)
°  Transition of Care [facility]) Screening Note   Patient Details  Name: Sheryl [name] Date of Birth: [birth year]   [facility]) CM/SW Contact:    Pollie Friar, RN Phone Number: 09/28/2021, 10:42 AM   CIR to assess for admission. Transition of Care Department [facility]) has reviewed patient. We will continue to monitor patient advancement through interdisciplinary progression rounds. If new patient transition needs arise, please place a TOC consult.

## 2021-09-28 NOTE — Progress Notes (Signed)
Inpatient Rehabilitation Admissions Coordinator   I met at bedside with patient and her daughter to discuss her rehab options. Patient lives alone and does not have the caregiver supports projected that she would need after a CIR admit. They are requesting SNF. I have alerted acute team and TOC. We will sign off at this time.  Danne Baxter, RN, MSN Rehab Admissions Coordinator [phone] 09/28/2021 3:13 PM

## 2021-09-28 NOTE — Assessment & Plan Note (Signed)
Creatinine appears to be at baseline, continue to monitor.

## 2021-09-28 NOTE — Progress Notes (Signed)
Speech Language Pathology Treatment: Dysphagia;Cognitive-Linquistic  Patient Details Name: Sheryl [name] MRN: [id number] DOB: 03-Jan-1951 Today's Date: 09/28/2021 Time: 7829-5621 SLP Time Calculation (min) (ACUTE ONLY): 29 min  Assessment / Plan / Recommendation Clinical Impression  SWALLOWING Pt seen for ongoing dysphagia management.  RN reports coughing with NTL.  Pt with thick secretions in oral cavity.  SLP set up suction and provided thorough oral care. Pt tolerated NTL with no clinical s/s of aspiration and drank from cup independently.  There was R side anterior spillage intermittently with NTL and of secretions.  Pt using tissues to manage.  Provided clean washcloth for pt as well.  Pt consumed 4 oz of puree by spoon.  ON last 2 or three bites there was some wet vocal quality noted which cleared.  On last trial there was immediate strong prolonged cough.  Suction was used to assist in clearing.  Pt's vocal quality returned to baseline.    Trial of swallowing exercises with pt today.  Pt completed effortful swallow with ice chips.  There was some throat clearing as session progressed.  Pt was unable to execute Masako or Mendelssohn.  Pt with good effort with Masako, but unable to perform swallow gesture with tongue protruded.  Pt required frequent reinstruction and verbal and tactile cuing for completion of CTAR, with fair effort and accuracy.  Recommend continuing trial swallow therapy as pt is able.  Continue puree diet with nectar thick liquid by cup with direct supervision.  Have suction available.  If clinical s/s of aspiration persist with PO intake, consider repeat MBSS.  COMMUNICATION Moderate dysarthria persists.  Pt is intelligible in known context (automatic speech tasks).  Introduced overarticulation compensatory strategy to patient.  Pt identified objects with 100% accuracy.  Pt answered yes no questions accurately and demonstrated ability to communicate needs/wants with RN  through this method.  Pt required some verbal and tactile cuing to follow directions for swallowing exercise, which are somewhat complex.  Pt was unable to execute all exercises, but suspect weakness rather than inability to comprehend was responsible for at least one of the errors with trial exercises.    HPI HPI: Pt is a 71 year old woman found down by her daughter in her home. 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 PMH: COPD, current smoker, HTN, HLD, prediabetes, CKD3, ADD, L TKA, R reverse TSA, depression, hearing loss.      SLP Plan  Continue with current plan of care      Recommendations for follow up therapy are one component of a multi-disciplinary discharge planning process, led by the attending physician.  Recommendations may be updated based on patient status, additional functional criteria and insurance authorization.    Recommendations  Diet recommendations: Dysphagia 1 (puree);Nectar-thick liquid Liquids provided via: Cup Medication Administration: Crushed with puree Supervision: Full supervision/cueing for compensatory strategies;Staff to assist with self feeding Compensations: Slow rate;Small sips/bites;Follow solids with liquid Postural Changes and/or Swallow Maneuvers: Seated upright 90 degrees                Oral Care Recommendations: Oral care BID Follow Up Recommendations: Acute inpatient rehab (3hours/day) Assistance recommended at discharge: Frequent or constant Supervision/Assistance SLP Visit Diagnosis: Dysphagia, oropharyngeal phase (R13.12);Aphasia (R47.01);Dysarthria and anarthria (R47.1) Plan: Continue with current plan of care           Celedonio Savage, [place], [place] Office:  [phone]   09/28/2021, 10:12 AM

## 2021-09-28 NOTE — Assessment & Plan Note (Signed)
Last A1c is 5.5%

## 2021-09-28 NOTE — NC FL2 (Signed)
[place] LEVEL OF CARE SCREENING TOOL     IDENTIFICATION  Patient Name: Sheryl [name] Birthdate: [birth year] Sex: female Admission Date (Current Location): 09/26/2021  [facility] and [place] Number:  Herbalist and Address:  [facility], [place] [address] [place], [place] [address]      Provider Number: [id number]  Attending Physician Name and Address:  Hosie Poisson, MD  Relative Name and Phone Number:       Current Level of Care: Hospital Recommended Level of Care: [place] Prior Approval Number:    Date Approved/Denied:   PASRR Number: [id number]  Discharge Plan: SNF    Current Diagnoses: Patient Active Problem List   Diagnosis Date Noted   Acute ischemic left MCA stroke ([place]) 09/26/2021   Rhabdomyolysis 09/26/2021   Right groin pain 04/17/2021   Tobacco abuse 12/14/2020   Restless leg syndrome 12/14/2020   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   Osteoporosis 05/21/2020   Aortic atherosclerosis ([place]) 05/09/2020   GERD (gastroesophageal reflux disease) 04/12/2020   Anemia 04/01/2019   S/P shoulder replacement, 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   Vertigo 06/05/2018   Status post total knee replacement, 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   Stage 3a chronic kidney disease (CKD) ([place]) - baseline SCr 1.1-1.3 12/27/2016   Insomnia 06/29/2016   Prediabetes 06/29/2016   LGSIL (low grade squamous intraepithelial dysplasia) 12/08/2015   COPD (chronic obstructive pulmonary disease) ([place]) 12/09/2014   COPD exacerbation ([place]) 11/02/2014   Lumbago 07/16/2014   Bilateral hand pain 07/16/2014   HTN (hypertension) 06/04/2012   Hyperlipidemia 06/04/2012   Depression 06/04/2012   ADD (attention deficit disorder) 06/04/2012   AR (allergic rhinitis) 06/04/2012    Orientation RESPIRATION BLADDER Height & Weight     Self, Time, Situation, Place  Normal Incontinent Weight: 141 lb 8.6 oz (64.2 kg) Height:  5\' 1"  (154.9 cm)  BEHAVIORAL SYMPTOMS/MOOD NEUROLOGICAL  BOWEL NUTRITION STATUS      Continent Diet (see DC summary)  AMBULATORY STATUS COMMUNICATION OF NEEDS Skin   Extensive Assist Verbally Normal                       Personal Care Assistance Level of Assistance  Bathing, Feeding, Dressing Bathing Assistance: Maximum assistance Feeding assistance: Limited assistance Dressing Assistance: Maximum assistance     Functional Limitations Info  Speech, Sight Sight Info: Impaired (wears glasses)   Speech Info: Impaired (dysarthria)    SPECIAL CARE FACTORS FREQUENCY  PT (By licensed PT), OT (By licensed OT), Speech therapy     PT Frequency: 5x/wk OT Frequency: 5x/wk     Speech Therapy Frequency: 5x/wk      Contractures Contractures Info: Not present    Additional Factors Info  Code Status, Allergies Code Status Info: Full Allergies Info: Macrobid (Nitrofurantoin Monohyd Macro)           Current Medications (09/28/2021):  This is the current hospital active medication list Current Facility-Administered Medications  Medication Dose Route Frequency Provider Last Rate Last Admin   acetaminophen (TYLENOL) tablet 650 mg  650 mg Oral Q4H PRN Kristopher Oppenheim, DO       Or   acetaminophen (TYLENOL) 160 MG/5ML solution 650 mg  650 mg Per Tube Q4H PRN Kristopher Oppenheim, DO       Or   acetaminophen (TYLENOL) suppository 650 mg  650 mg Rectal Q4H PRN Kristopher Oppenheim, DO       aspirin suppository 300 mg  300 mg Rectal Daily Rosalin Hawking, MD  300 mg at 09/27/21 1941   Or   aspirin EC tablet 325 mg  325 mg Oral Daily Rosalin Hawking, MD   325 mg at 09/28/21 0900   clopidogrel (PLAVIX) tablet 75 mg  75 mg Oral Daily Rosalin Hawking, MD   75 mg at 09/28/21 0900   enoxaparin (LOVENOX) injection 30 mg  30 mg Subcutaneous Q24H Rosalin Hawking, MD   30 mg at 09/27/21 1647   food thickener (SIMPLYTHICK (NECTAR/LEVEL 2/MILDLY THICK)) 10 packet  10 packet Oral PRN Hosie Poisson, MD       MEDLINE mouth rinse  15 mL Mouth Rinse BID Hosie Poisson, MD       nicotine (NICODERM  CQ - dosed in mg/24 hours) patch 14 mg  14 mg Transdermal Daily Kristopher Oppenheim, DO   14 mg at 09/28/21 [id number]   rosuvastatin (CRESTOR) tablet 20 mg  20 mg Oral Daily Rosalin Hawking, MD   20 mg at 09/28/21 0900     Discharge Medications: Please see discharge summary for a list of discharge medications.  Relevant Imaging Results:  Relevant Lab Results:   Additional Information SS#: [id number]  Geralynn Ochs, LCSW

## 2021-09-28 NOTE — Progress Notes (Signed)
Echocardiogram 2D Echocardiogram has been performed.  Sheryl [name] 09/28/2021, 9:31 AM

## 2021-09-28 NOTE — Progress Notes (Addendum)
STROKE TEAM PROGRESS NOTE   SUBJECTIVE (INTERVAL HISTORY) No family is at the bedside. Overall her condition is stable. Pt still has severe dysarthria, and right sided weakness, but seems following commands. VVS on board, stating that patient is a likely candidate for TCAR. Although, they will not intervene until after a couple weeks of recovery and should continue DAPT.   OBJECTIVE CBC:  Recent Labs  Lab 09/26/21 2019 09/26/21 2030  WBC  --  14.8*  NEUTROABS  --  12.3*  HGB 10.9* 10.3*  HCT 32.0* 34.4*  MCV  --  91.7  PLT  --  502    Basic Metabolic Panel:  Recent Labs  Lab 09/26/21 2019 09/26/21 2030  NA 141 143  K 6.1* 4.0  CL 113* 108  CO2  --  17*  GLUCOSE 107* 101*  BUN 52* 32*  CREATININE 1.10* 1.24*  CALCIUM  --  9.8    Lipid Panel:  Recent Labs  Lab 09/27/21 0748  CHOL 168  TRIG 155*  HDL 50  CHOLHDL 3.4  VLDL 31  LDLCALC 87   HgbA1c:  Recent Labs  Lab 09/27/21 2030  HGBA1C 5.5   Urine Drug Screen:  Recent Labs  Lab 09/26/21 2252  LABOPIA NONE DETECTED  COCAINSCRNUR NONE DETECTED  LABBENZ NONE DETECTED  AMPHETMU NONE DETECTED  THCU NONE DETECTED  LABBARB NONE DETECTED     Alcohol Level  Recent Labs  Lab 09/26/21 2030  ETH <10     IMAGING past 24 hours MR BRAIN WO CONTRAST  Result Date: 09/27/2021 CLINICAL DATA:  Stroke, follow-up. Additional history provided: Provided history: EXAM: MRI HEAD WITHOUT CONTRAST TECHNIQUE: Multiplanar, multiecho pulse sequences of the brain and surrounding structures were obtained without intravenous contrast. COMPARISON:  CT angiogram head/neck 09/26/2021. FINDINGS: Brain: Mild intermittent motion degradation. Redemonstrated acute/early subacute infarct within the left corona radiata, left caudate and lentiform nuclei, left internal capsule and left external capsule. This infarct has slightly increased in size since the CT angiogram head/neck of 09/26/2021, now measuring 5.5 x 2.7 x 3.7 cm (previously  4.8 x 2.7 x 3.7 cm). As before, there is local mass effect with partial effacement of the left lateral ventricle. No midline shift or evidence of hemorrhagic conversion. Additional small acute/early subacute infarct within the anteromedial left temporal lobe (in retrospect likely present on the prior CTA head of 09/26/2021). There are several additional small acute early/subacute cortically based infarcts within the left temporal, parietal and frontal lobes (left MCA vascular territory). No evidence of an intracranial mass. No extra-axial fluid collection. 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 A severe stenosis versus focal occlusion was also noted within an inferior division proximal M2 left MCA vessel on this prior exam. Skull and upper cervical spine: No focal suspicious marrow lesion. Sinuses/Orbits: Visualized orbits show no acute finding. Bilateral ocular lens replacements. IMPRESSION: 5.5 x 2.7 x 3.7 cm acute/early subacute infarct within the left corona radiata, basal ganglia, internal capsule and external capsule. This infarct has slightly increased in size since the CTA head/neck of 09/26/2021. As before, there is local mass effect with partial effacement of the left lateral ventricle. No midline shift or evidence of hemorrhagic conversion. Multiple additional small acute/early subacute cortically-based infarcts within the left frontal, parietal and temporal lobes (left MCA vascular territory). Electronically Signed   By: Kellie Simmering D.O.   On: 09/27/2021 12:18   DG Swallowing Func-Speech Pathology  Result Date:  09/27/2021 Table formatting from the original result was not included. Objective Swallowing Evaluation: Type of Study: MBS-Modified Barium Swallow Study  Patient Details Name: MANNA GOSE MRN: [id number] Date of Birth: [birth year] Today's Date: 09/27/2021 Time: SLP Start Time (ACUTE ONLY): 19 -SLP Stop Time  (ACUTE ONLY): 1448 SLP Time Calculation (min) (ACUTE ONLY): 25 min Past Medical History: Past Medical History: Diagnosis Date  ADD (attention deficit disorder)   Allergy   Arthritis   CKD (chronic kidney disease), stage III (HCC)   COPD (chronic obstructive pulmonary disease) ([place]) 12/09/2014  Depression   Does use hearing aid   Dyspnea   on exertion  Hyperlipidemia   Hypertension   LGSIL (low grade squamous intraepithelial dysplasia)   Osteoporosis   Pre-diabetes  Past Surgical History: Past Surgical History: Procedure Laterality Date  JOINT REPLACEMENT    right knee  MULTIPLE TOOTH EXTRACTIONS    REPLACEMENT TOTAL KNEE  2013  right  REVERSE SHOULDER ARTHROPLASTY Right 03/30/2019  Procedure: REVERSE TOTAL SHOULDER ARTHROPLASTY;  Surgeon: Netta Cedars, MD;  Location: [facility];  Service: Orthopedics;  Laterality: Right;  interscalene block  TOTAL KNEE ARTHROPLASTY Left 12/27/2017  TOTAL KNEE ARTHROPLASTY Left 12/27/2017  Procedure: LEFT TOTAL KNEE ARTHROPLASTY;  Surgeon: Netta Cedars, MD;  Location: [facility];  Service: Orthopedics;  Laterality: Left; HPI: Pt is a 71 year old woman found down by her daughter in her home. 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 PMH: COPD, current smoker, HTN, HLD, prediabetes, CKD3, ADD, L TKA, R reverse TSA, depression, hearing loss.  Subjective: alert, cooperative  Recommendations for follow up therapy are one component of a multi-disciplinary discharge planning process, led by the attending physician.  Recommendations may be updated based on patient status, additional functional criteria and insurance authorization. Assessment / Plan / Recommendation Clinical Impressions 09/27/2021 Clinical Impression Patient presents with a mild-moderate oropharyngeal dysphagia as per this MBS. During oral phase, patient exhibited decrased anterior to posterior transit of boluses which caused  moderate delay with puree solids and mild delay with liquids. During pharyngeal phase, swallow was initiated at level of pyriform sinus with thin liquids and nectar thick liquids and at level of vallecular sinus with puree solids. Patient exhibited trace silent aspiration during the swallow with thin liquids and trace penetration with nectar thick liquids. Retention of barium observed in vallecular sinus with all tested consistencies (trace to mild with puree solids, trace with nectar and thin liquids). SLP is recommending to initiate Dys 1 (puree) solids and nectar thick liquids diet at this time. SLP Visit Diagnosis Dysphagia, oropharyngeal phase (R13.12) Attention and concentration deficit following -- Frontal lobe and executive function deficit following -- Impact on safety and function Mild aspiration risk;Moderate aspiration risk   Treatment Recommendations 09/27/2021 Treatment Recommendations Therapy as outlined in treatment plan below   Prognosis 09/27/2021 Prognosis for Safe Diet Advancement Good Barriers to Reach Goals -- Barriers/Prognosis Comment -- Diet Recommendations 09/27/2021 SLP Diet Recommendations Dysphagia 1 (Puree) solids;Nectar thick liquid Liquid Administration via Cup Medication Administration Crushed with puree Compensations Slow rate;Small sips/bites;Follow solids with liquid Postural Changes Seated upright at 90 degrees   Other Recommendations 09/27/2021 Recommended Consults -- Oral Care Recommendations Oral care BID;Staff/trained caregiver to provide oral care;Oral care before and after PO Other Recommendations Order thickener from pharmacy;Prohibited food (jello, ice cream, thin soups);Clarify dietary restrictions;Remove water pitcher;Have oral suction available Follow Up Recommendations Acute inpatient rehab (3hours/day)  Assistance recommended at discharge Frequent or constant Supervision/Assistance Functional Status Assessment Patient has had a recent decline in their functional status and  demonstrates the ability to make significant improvements in function in a reasonable and predictable amount of time. Frequency and Duration  09/27/2021 Speech Therapy Frequency (ACUTE ONLY) min 2x/week Treatment Duration 1 week   Oral Phase 09/27/2021 Oral Phase Impaired Oral - Pudding Teaspoon -- Oral - Pudding Cup -- Oral - Honey Teaspoon -- Oral - Honey Cup -- Oral - Nectar Teaspoon -- Oral - Nectar Cup Reduced posterior propulsion;Delayed oral transit;Weak lingual manipulation Oral - Nectar Straw -- Oral - Thin Teaspoon -- Oral - Thin Cup Weak lingual manipulation;Delayed oral transit;Reduced posterior propulsion Oral - Thin Straw -- Oral - Puree Delayed oral transit;Reduced posterior propulsion;Weak lingual manipulation Oral - Mech Soft -- Oral - Regular -- Oral - Multi-Consistency -- Oral - Pill -- Oral Phase - Comment --  Pharyngeal Phase 09/27/2021 Pharyngeal Phase Impaired Pharyngeal- Pudding Teaspoon -- Pharyngeal -- Pharyngeal- Pudding Cup -- Pharyngeal -- Pharyngeal- Honey Teaspoon -- Pharyngeal -- Pharyngeal- Honey Cup -- Pharyngeal -- Pharyngeal- Nectar Teaspoon -- Pharyngeal -- Pharyngeal- Nectar Cup Delayed swallow initiation-vallecula;Delayed swallow initiation-pyriform sinuses;Penetration/Aspiration during swallow;Pharyngeal residue - valleculae Pharyngeal Material enters airway, remains ABOVE vocal cords then ejected out Pharyngeal- Nectar Straw -- Pharyngeal -- Pharyngeal- Thin Teaspoon -- Pharyngeal -- Pharyngeal- Thin Cup Delayed swallow initiation-pyriform sinuses;Reduced airway/laryngeal closure;Penetration/Aspiration during swallow;Trace aspiration;Pharyngeal residue - valleculae Pharyngeal Material enters airway, passes BELOW cords without attempt by patient to eject out (silent aspiration) Pharyngeal- Thin Straw -- Pharyngeal -- Pharyngeal- Puree Delayed swallow initiation-vallecula;Reduced pharyngeal peristalsis;Pharyngeal residue - valleculae Pharyngeal -- Pharyngeal- Mechanical Soft --  Pharyngeal -- Pharyngeal- Regular -- Pharyngeal -- Pharyngeal- Multi-consistency -- Pharyngeal -- Pharyngeal- Pill -- Pharyngeal -- Pharyngeal Comment --  Cervical Esophageal Phase  09/27/2021 Cervical Esophageal Phase Impaired Pudding Teaspoon -- Pudding Cup -- Honey Teaspoon -- Honey Cup -- Nectar Teaspoon -- Nectar Cup -- Nectar Straw -- Thin Teaspoon NT Thin Cup Esophageal backflow into cervical esophagus Thin Straw NT Puree -- Mechanical Soft -- Regular -- Multi-consistency -- Pill -- Cervical Esophageal Comment mild amount of retrograde movement of bolus in cervical esophagus Sonia Baller, MA, CCC-SLP Speech Therapy                     VAS US CAROTID  Result Date: 09/27/2021 Carotid Arterial Duplex Study Patient Name:  MARIROSE DEVENEY  Date of Exam:   09/27/2021 Medical Rec #: [id number]        Accession #:    [id number] Date of Birth: 1951-05-15       Patient Gender: F Patient Age:   20 years Exam Location:  [facility] Procedure:      VAS US CAROTID Referring Phys: Aldona Bar RHYNE --------------------------------------------------------------------------------  Risk Factors:      Hypertension, hyperlipidemia. Comparison Study:  09/26/2021 - CTA neck:                     1. The left common and internal carotid arteries are patent                    within                    the neck. Prominent atherosclerotic plaque about the left                    carotid  bifurcation and within the proximal ICA. Exact quantification                    of                    stenosis at the origin of the left ICA is difficult due to                    blooming                    artifact from calcified plaque at this site, but appears                    greater                    than 80%. Consider correlation with a carotid artery duplex                    ultrasound.                    2. The right common and internal carotid arteries are patent                    within                    the neck  without hemodynamically significant stenosis. Mild                    atherosclerotic plaque within the right carotid system within                    the                    neck, as described.                    3. Vertebral arteries patent within the neck without                    significant                    stenosis.                    4. Aortic Atherosclerosis (ICD10-I70.0) and Emphysema                    (ICD10-J43.9). Performing Technologist: Oliver Hum RVT  Examination Guidelines: A complete evaluation includes B-mode imaging, spectral Doppler, color Doppler, and power Doppler as needed of all accessible portions of each vessel. Bilateral testing is considered an integral part of a complete examination. Limited examinations for reoccurring indications may be performed as noted.  Right Carotid Findings: +----------+--------+--------+--------+-----------------------+--------+             PSV cm/s EDV cm/s Stenosis Plaque Description      Comments  +----------+--------+--------+--------+-----------------------+--------+  CCA Prox   90       19                smooth and heterogenous           +----------+--------+--------+--------+-----------------------+--------+  CCA Distal 73       21                smooth and heterogenous           +----------+--------+--------+--------+-----------------------+--------+  ICA Prox  74       22                calcific                          +----------+--------+--------+--------+-----------------------+--------+  ICA Distal 111      38                                        tortuous  +----------+--------+--------+--------+-----------------------+--------+  ECA        97       12                                                  +----------+--------+--------+--------+-----------------------+--------+ +----------+--------+-------+--------+-------------------+             PSV cm/s EDV cms Describe Arm Pressure (mmHG)   +----------+--------+-------+--------+-------------------+  Subclavian 150                                            +----------+--------+-------+--------+-------------------+ +---------+--------+--+--------+--+---------+  Vertebral PSV cm/s 88 EDV cm/s 17 Antegrade  +---------+--------+--+--------+--+---------+  Left Carotid Findings: +----------+-------+-------+--------+---------------------------------+--------+             PSV     EDV     Stenosis Plaque Description                Comments              cm/s    cm/s                                                         +----------+-------+-------+--------+---------------------------------+--------+  CCA Prox   71      10               smooth and heterogenous                     +----------+-------+-------+--------+---------------------------------+--------+  CCA Distal 55      5                smooth and heterogenous                     +----------+-------+-------+--------+---------------------------------+--------+  ICA Prox   413     127     80-99%   calcific, irregular and                                                          heterogenous                                +----------+-------+-------+--------+---------------------------------+--------+  ICA Mid    157     33                                                           +----------+-------+-------+--------+---------------------------------+--------+  ICA Distal 63      19                                                 tortuous  +----------+-------+-------+--------+---------------------------------+--------+  ECA        166     18                                                           +----------+-------+-------+--------+---------------------------------+--------+ +----------+--------+--------+--------+-------------------+             PSV cm/s EDV cm/s Describe Arm Pressure (mmHG)  +----------+--------+--------+--------+-------------------+  Subclavian 134                                              +----------+--------+--------+--------+-------------------+ +---------+--------+--+--------+--+---------+  Vertebral PSV cm/s 73 EDV cm/s 17 Antegrade  +---------+--------+--+--------+--+---------+   Summary: Right Carotid: Velocities in the right ICA are consistent with a 1-39% stenosis. Left Carotid: Velocities in the left ICA are consistent with a 80-99% stenosis. Vertebrals: Bilateral vertebral arteries demonstrate antegrade flow. *See table(s) above for measurements and observations.  Electronically signed by Harold Barban MD on 09/27/2021 at 8:54:19 PM.    Final      PHYSICAL EXAM Temp:  [97.8 F (36.6 C)-98.2 F (36.8 C)] 97.8 F (36.6 C) (02/09 0737) Pulse Rate:  [74-101] 74 (02/09 0737) Resp:  [17-24] 20 (02/09 0737) BP: (136-165)/(54-85) 154/77 (02/09 0737) SpO2:  [91 %-100 %] 100 % (02/09 0737) Weight:  [64.2 kg] 64.2 kg (02/08 2146)  General - Well nourished, well developed, in no apparent distress.  Cardiovascular - Regular rhythm and rate.  Neuro - awake, alert, eyes open, severe dysarthria with intangible words, but seems to orientated to place with choices. Able to follow most simple commands. Not able to name or repeat due to near anarthria. No gaze palsy, tracking bilaterally, blinking to visual threat bilaterally. right facial droop. Tongue protrusion to the right. LUE at least 4/5, RUE 2/5. LLE at least 3/5 and RLE 2/5 proximal and 0/5 distal. Sensation not quite consistent but seems to have decreased sensation on the right, gait not tested.    ASSESSMENT/PLAN Ms. DAYLEN HACK is a 71 y.o. female with history of CKD Stage III, HLD, HTN, pre-diabetes, COPD, depression, and LGSIL presenting with R-sided weakness and dysarthria after being found down at home..   Stroke:  left large BG/CR infarct likely secondary large vessel source with left ICA high grade stenosis Code Stroke CT head subacute ischemic infarct of L corona radiata, L caudate and lentiform nuclei, and L  internal and external capsules CTA head & neck left ICA 80% stenosis, left M1 occlusion with M2 branch reconstitution with possible inferior left M2 stenosis/short segment occlusion. MRI large left BG/CR infarct Carotid Doppler left ICA 80 to 99% stenosis 2D Echo LVEF 60-65%, mild MVR LDL 87 HgbA1c 5.5% UDS negative VTE prophylaxis - Lovenox No antithrombotic prior to admission, now on aspirin 325 mg daily and Plavix DAPT.  Continue DAPT and further antiplatelet regimen per vascular surgery. Therapy recommendations:  AIR, SLP  (  Dysphagia 1 puree, nectar thick liquid). Disposition:  Pending CIR consult  Carotid stenosis CTA head & neck left ICA 80% stenosis Carotid Doppler left ICA 80 to 99% stenosis VVS Dr. Trula Slade on board Plan for TCAR in 2 weeks after patient improved neurologically On DAPT  Hypertension Home meds:  Norvasc 5 mg, Losartan 100 mg Stable BP goal 130-160 until carotid revascularization Long-term BP goal normotensive  Hyperlipidemia Home meds:  Rosuvastatin 20 mg daily LDL 87, goal < 70 Increase Crestor to 40 Continue statin at discharge  Dysphagia Passes swallow On dysphagia 1 and nectar thick liquid Gentle IV hydration  Tobacco abuse Current smoker Smoking cessation counseling provided Nicotine patch provided Pt is willing to quit  Other Stroke Risk Factors Advanced Age >/= 54  Family hx stroke (Maternal grandmother)  Other Active Problems CK trending down 1795->832  Hospital day # 2  Rosezetta Schlatter, MD Neuro Psych Resident PGY-1 09/28/2021  1:32 PM  ATTENDING NOTE: I reviewed above note and agree with the assessment and plan. Pt was seen and examined.   Patient reclining in bed, nurse tech was feeding her lunch.  Her speech improved from yesterday, still lethargic but no more intangible.  Still has right facial droop and right hemiplegia.  VVS on board, plan for left TCAR in 2 weeks to allow patient to improve neurologically.  Continue DAPT  for now and further regimen per vascular surgery.  Increase Crestor from 20 to 40.  BP goal 130-160 before carotid revascularization.  PT/OT recommend SNF.  For detailed assessment and plan, please refer to above as I have made changes wherever appropriate.   Neurology will sign off. Please call with questions. Pt will follow up with stroke clinic NP at [facility] in about 4 weeks. Thanks for the consult.   Rosalin Hawking, MD PhD Stroke Neurology 09/28/2021 6:31 PM      To contact Stroke Continuity provider, please refer to [email or web address] After hours, contact General Neurology

## 2021-09-28 NOTE — Assessment & Plan Note (Signed)
No wheezing heard on exam.  Continue with bronchodilators as needed.

## 2021-09-28 NOTE — Care Management Important Message (Signed)
Important Message  Patient Details  Name: Sheryl [name] MRN: [id number] Date of Birth: 1951/04/15   Medicare Important Message Given:  Yes   Patient has a contact precaution in place I tried to call the patient mobile phone number but got no answer will mail to the patient home address  Orbie Pyo 09/28/2021, 3:27 PM

## 2021-09-28 NOTE — Assessment & Plan Note (Signed)
Improving CK levels, gently hydrate and repeat CK in am.

## 2021-09-28 NOTE — TOC Initial Note (Signed)
Transition of Care [facility]) - Initial/Assessment Note    Patient Details  Name: Sheryl [name] MRN: [id number] Date of Birth: [birth year]  Transition of Care [facility]) CM/SW Contact:    Pollie Friar, RN Phone Number: 09/28/2021, 3:46 PM  Clinical Narrative:                 Patient is from home alone. Recommendations are for CIR but pt will not have the support needed after a rehab stay. CM met with the patient and her daughter and they are agreeable to SNF rehab. They are interested in a rehab in the [facility] area. TOC will fax her out in [facility] and provide bed offers to the patient and daughter when available.  TOC following.  Expected Discharge Plan: Skilled Nursing Facility Barriers to Discharge: Continued Medical Work up   Patient Goals and CMS Choice   CMS Medicare.gov Compare Post Acute Care list provided to:: Patient Represenative (must comment) Choice offered to / list presented to : Adult Children, Patient  Expected Discharge Plan and Services Expected Discharge Plan: [place] In-house Referral: Clinical Social Work Discharge Planning Services: CM Consult Post Acute Care Choice: [place] arrangements for the past 2 months: [place]                                      Prior Living Arrangements/Services Living arrangements for the past 2 months: Single Family Home Lives with:: Self Patient language and need for interpreter reviewed:: Yes Do you feel safe going back to the place where you live?: Yes      Need for Family Participation in Patient Care: Yes (Comment) Care giver support system in place?: No (comment)   Criminal Activity/Legal Involvement Pertinent to Current Situation/Hospitalization: No - Comment as needed  Activities of Daily Living      Permission Sought/Granted                  Emotional Assessment Appearance:: Appears stated age Attitude/Demeanor/Rapport:  Engaged Affect (typically observed): Accepting Orientation: : Oriented to Self, Oriented to Place, Oriented to  Time, Oriented to Situation   Psych Involvement: No (comment)  Admission diagnosis:  Encounter for imaging to screen for metal prior to MRI [Z13.89] Acute ischemic left MCA stroke ([place]) [I63.512] Cerebrovascular accident (CVA), unspecified mechanism ([place]) [I63.9] Patient Active Problem List   Diagnosis Date Noted   Acute ischemic left MCA stroke ([place]) 09/26/2021   Rhabdomyolysis 09/26/2021   Right groin pain 04/17/2021   Tobacco abuse 12/14/2020   Restless leg syndrome 12/14/2020   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   Osteoporosis 05/21/2020   Aortic atherosclerosis ([place]) 05/09/2020   GERD (gastroesophageal reflux disease) 04/12/2020   Anemia 04/01/2019   S/P shoulder replacement, 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   Vertigo 06/05/2018   Status post total knee replacement, 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   Stage 3a chronic kidney disease (CKD) ([place]) - baseline SCr 1.1-1.3 12/27/2016   Insomnia 06/29/2016   Prediabetes 06/29/2016   LGSIL (low grade squamous intraepithelial dysplasia) 12/08/2015   COPD (chronic obstructive pulmonary disease) ([place]) 12/09/2014   COPD exacerbation ([place]) 11/02/2014   Lumbago 07/16/2014   Bilateral hand pain 07/16/2014   HTN (hypertension) 06/04/2012   Hyperlipidemia 06/04/2012   Depression 06/04/2012   ADD (attention deficit disorder) 06/04/2012   AR (allergic rhinitis) 06/04/2012   PCP:  Binnie Rail, MD Pharmacy:   [id number] [place],  [place] [address] [place] [address] Phone: [phone] Fax: [phone]     Social Determinants of Health (SDOH) Interventions    Readmission Risk Interventions No flowsheet data found.

## 2021-09-28 NOTE — Assessment & Plan Note (Signed)
Pt failed bedside swallow eval.  LDL is 87 Continue with crestor.

## 2021-09-28 NOTE — Progress Notes (Signed)
°  Progress Note    09/28/2021 8:41 AM Hospital Day 1  Subjective:  when asked how she is feeling she nodded her head.  Able to answer questions slightly better today  afebrile  Vitals:   09/28/21 0445 09/28/21 0737  BP: (!) 143/65 (!) 154/77  Pulse: 84 74  Resp: 17 20  Temp: 98 F (36.7 C) 97.8 F (36.6 C)  SpO2: 97% 100%    Physical Exam: General:  no distress Lungs:  non labored Neuro:  still with severe dysarthria but able to say a couple more words than yesterday.  Unable to move RUE;  able to slightly move RLE  CBC    Component Value Date/Time   WBC 14.8 (H) 09/26/2021 2030   RBC 3.75 (L) 09/26/2021 2030   HGB 10.3 (L) 09/26/2021 2030   HCT 34.4 (L) 09/26/2021 2030   PLT 400 09/26/2021 2030   MCV 91.7 09/26/2021 2030   MCV 89.9 02/14/2016 1411   MCH 27.5 09/26/2021 2030   MCHC 29.9 (L) 09/26/2021 2030   RDW 14.8 09/26/2021 2030   LYMPHSABS 1.4 09/26/2021 2030   MONOABS 1.0 09/26/2021 2030   EOSABS 0.0 09/26/2021 2030   BASOSABS 0.1 09/26/2021 2030    BMET    Component Value Date/Time   NA 143 09/26/2021 2030   NA 138 02/18/2017 1157   K 4.0 09/26/2021 2030   CL 108 09/26/2021 2030   CO2 17 (L) 09/26/2021 2030   GLUCOSE 101 (H) 09/26/2021 2030   BUN 32 (H) 09/26/2021 2030   BUN 14 02/18/2017 1157   CREATININE 1.24 (H) 09/26/2021 2030   CREATININE 1.32 (H) 04/12/2020 1434   CALCIUM 9.8 09/26/2021 2030   GFRNONAA 47 (L) 09/26/2021 2030   GFRAA >60 04/02/2019 0811    INR    Component Value Date/Time   INR 1.1 09/26/2021 2239     Intake/Output Summary (Last 24 hours) at 09/28/2021 0841 Last data filed at 09/28/2021 0600 Gross per 24 hour  Intake 303.75 ml  Output 375 ml  Net -71.25 ml     Assessment/Plan:  71 y.o. female with symptomatic left carotid artery stenosis Hospital Day 1  -pt with large infarct and significant right sided weakness/paralysis.  Will need at least a couple of weeks for recovery before intervention.  Per Dr. Trula Slade,  she is candidate for left TCAR.  Continue plavix/asa/statin   Leontine Locket, PA-C [facility] [phone] 09/28/2021 8:41 AM

## 2021-09-28 NOTE — Assessment & Plan Note (Signed)
Further stroke work up in progress.  Started her on aspirin 325 mg dialy and plavix 75 mg daily.  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 MRI large left BG/CR infarct Carotid Doppler left ICA 80 to 99% stenosis Echocardiogram is unremarkable.  Therapy evaluations are recommending CIR.  Vascular surgery consulted for left ICA stenosis. Continue with plavix , aspirin and statin.

## 2021-09-28 NOTE — Progress Notes (Signed)
[facility]                                                                               Sheryl [name], is a 71 y.o. female, DOB - 1951-02-17, NWG:[id number] Admit date - 09/26/2021    Outpatient Primary MD for the patient is Burns, Claudina Lick, MD  LOS - 2  days    Brief summary   71 year old female with a history of hyperlipidemia, COPD, prediabetes, CKD stage III, chronic tobacco abuse, ADD presents to the ER today after being found down on the floor.  Last known normal was about 48 hours ago.  Family talk to her on Sunday night.  No contact with her on Monday.  Patient would not answer the phone until today.  EMS was called.  Patient was found on the ground.  Brought to the ER.  Patient is left-hand dominant.  She has a dense right upper extremity parapsoas.  CT scan demonstrated a large infarct of the left corona radiata, left caudate nucleus, left internal capsule and left external capsule.  Local mass effect with effacement of the left lateral ventricle. NEUROLOGY consulted and recommendations given.   Assessment & Plan    Assessment and Plan: * Acute ischemic left MCA stroke ([place])- (present on admission) Further stroke work up in progress.  Started her on aspirin 325 mg dialy and plavix 75 mg daily.  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 MRI large left BG/CR infarct Carotid Doppler left ICA 80 to 99% stenosis Echocardiogram is unremarkable.  Therapy evaluations are recommending CIR.  Vascular surgery consulted for left ICA stenosis. Continue with plavix , aspirin and statin.   Hyperlipidemia- (present on admission) Pt failed bedside swallow eval.  LDL is 87 Continue with crestor.   Tobacco abuse- (present on admission) Add nicotine patch.  Rhabdomyolysis Improving CK levels, gently hydrate and repeat CK in am.    GERD (gastroesophageal reflux  disease)- (present on admission) Stable.   Stage 3a chronic kidney disease (CKD) (HCC) - baseline SCr 1.1-1.3- (present on admission) Creatinine appears to be at baseline, continue to monitor.   Prediabetes- (present on admission) Last A1c is 5.5%  COPD (chronic obstructive pulmonary disease) ([place])- (present on admission) No wheezing heard on exam.  Continue with bronchodilators as needed.   ADD (attention deficit disorder)- (present on admission) stable  HTN (hypertension)- (present on admission) permissive hypertension up to 220/110 mmhg.          Estimated body mass index is 26.74 kg/m as calculated from the following:   Height as of this encounter: 5\' 1"  (1.549 m).   Weight as of this encounter: 64.2 kg.  Code Status: FULL CODE.  DVT Prophylaxis:  enoxaparin (LOVENOX) injection 30 mg Start: 09/27/21 1700 SCD's Start: 09/26/21 2337   Level of Care: Level of care: Progressive Family Communication: Updated patient's family at bedside.   Disposition Plan:     Remains inpatient appropriate:STROKE WORKUP   Procedures:  MRI brain CTA HEAD AND NECK.  CAROTID DUPLEX  Consultants:   Neurology Vascular surgery.   Antimicrobials:  Anti-infectives (From admission, onward)    None        Medications  Scheduled Meds:  aspirin  300 mg Rectal Daily   Or   aspirin EC  325 mg Oral Daily   clopidogrel  75 mg Oral Daily   enoxaparin (LOVENOX) injection  30 mg Subcutaneous Q24H   mouth rinse  15 mL Mouth Rinse BID   nicotine  14 mg Transdermal Daily   rosuvastatin  20 mg Oral Daily   Continuous Infusions:   PRN Meds:.acetaminophen **OR** acetaminophen (TYLENOL) oral liquid 160 mg/5 mL **OR** acetaminophen, food thickener    Subjective:   Sheryl [name] was seen and examined today. Alert and comfortable, following simple commands.   Objective:   Vitals:   09/27/21 2345 09/28/21 0445 09/28/21 0737 09/28/21 1606  BP: 136/72 (!) 143/65 (!) 154/77 (!)  158/84  Pulse: 86 84 74 (!) 101  Resp: (!) 21 17 20 20   Temp: 97.9 F (36.6 C) 98 F (36.7 C) 97.8 F (36.6 C) 98.6 F (37 C)  TempSrc: Oral Oral Oral Oral  SpO2: 95% 97% 100%   Weight:      Height:        Intake/Output Summary (Last 24 hours) at 09/28/2021 1738 Last data filed at 09/28/2021 0600 Gross per 24 hour  Intake 303.75 ml  Output 375 ml  Net -71.25 ml   Filed Weights   09/27/21 2146  Weight: 64.2 kg     Exam General exam: Appears calm and comfortable  Respiratory system: Clear to auscultation. Respiratory effort normal. Cardiovascular system: S1 & S2 heard, RRR. No JVD, . No pedal edema. Gastrointestinal system: Abdomen is nondistended, soft and nontender. Normal bowel sounds heard. Central nervous system: Alert , following simple commands. Right facial droop.  Extremities: right sided weakness . No pedal edema.  Skin: No rashes, lesions or ulcers Psychiatry: Mood & affect appropriate.     Data Reviewed:  I have personally reviewed following labs and imaging studies   CBC Lab Results  Component Value Date   WBC 14.8 (H) 09/26/2021   RBC 3.75 (L) 09/26/2021   HGB 10.3 (L) 09/26/2021   HCT 34.4 (L) 09/26/2021   MCV 91.7 09/26/2021   MCH 27.5 09/26/2021   PLT 400 09/26/2021   MCHC 29.9 (L) 09/26/2021   RDW 14.8 09/26/2021   LYMPHSABS 1.4 09/26/2021   MONOABS 1.0 09/26/2021   EOSABS 0.0 09/26/2021   BASOSABS 0.1 09/60/4540     Last metabolic panel Lab Results  Component Value Date   NA 143 09/26/2021   K 4.0 09/26/2021   CL 108 09/26/2021   CO2 17 (L) 09/26/2021   BUN 32 (H) 09/26/2021   CREATININE 1.24 (H) 09/26/2021   GLUCOSE 101 (H) 09/26/2021   GFRNONAA 47 (L) 09/26/2021   GFRAA >60 04/02/2019   CALCIUM 9.8 09/26/2021   PROT 8.3 (H) 09/26/2021   ALBUMIN 3.8 09/26/2021   LABGLOB 3.2 02/18/2017   AGRATIO 1.3 02/18/2017   BILITOT 0.5 09/26/2021   ALKPHOS 142 (H) 09/26/2021   AST 140 (H) 09/26/2021   ALT 76 (H) 09/26/2021   ANIONGAP  18 (H) 09/26/2021    CBG (last 3)  No results for input(s): GLUCAP in the last 72 hours.    Coagulation Profile: Recent Labs  Lab 09/26/21 2239  INR 1.1      Radiology Studies: CT ANGIO HEAD NECK W WO CM  Result Date: 09/26/2021 CLINICAL DATA:  Provided history: Neuro deficit, acute, stroke suspected. Additional history  provided: Patient from home after daughter found patient on ground, last known well Sunday night, stroke-like symptoms, right arm weakness, slurred speech. EXAM: CT ANGIOGRAPHY HEAD AND NECK TECHNIQUE: Multidetector CT imaging of the head and neck was performed using the standard protocol during bolus administration of intravenous contrast. Multiplanar CT image reconstructions and MIPs were obtained to evaluate the vascular anatomy. Carotid stenosis measurements (when applicable) are obtained utilizing NASCET criteria, using the distal internal carotid diameter as the denominator. RADIATION DOSE REDUCTION: This exam was performed according to the departmental dose-optimization program which includes automated exposure control, adjustment of the mA and/or kV according to patient size and/or use of iterative reconstruction technique. CONTRAST:  68mL OMNIPAQUE IOHEXOL 350 MG/ML SOLN COMPARISON:  None. FINDINGS: CT HEAD FINDINGS Brain: Cerebral volume appears normal for age. Acute or early subacute infarct measuring 4.8 x 2.7 x 3.7 cm within the left corona radiata, left caudate and lentiform nuclei, left internal capsule and left external capsule. Mild local mass effect with partial effacement of the left lateral ventricle. No midline shift. No evidence of hemorrhagic conversion. No demarcated cortical infarct. No extra-axial fluid collection. No evidence of an intracranial mass. No midline shift. Vascular: No hyperdense vessel.  Atherosclerotic calcifications. Skull: Normal. Negative for fracture or focal lesion. Sinuses: Visualized orbits show no acute finding. Orbits: No orbital  mass or acute orbital finding. Review of the MIP images confirms the above findings CTA NECK FINDINGS Aortic arch: Standard aortic branching. Atherosclerotic plaque within the visualized aortic arch and proximal major branch vessels of the neck. Motion artifact limits evaluation of the right subclavian artery. Streak and beam hardening artifact arising from a dense left-sided contrast bolus limits evaluation of the left subclavian artery. Within this limitation, no definite hemodynamically significant stenosis is identified within the innominate or proximal subclavian arteries. Right carotid system: CCA and ICA patent within the neck without significant stenosis (50% or greater). Mild atherosclerotic plaque about the carotid bifurcation and within the proximal ICA. Left carotid system: CCA and ICA patent within the neck. Soft calcified plaque about the carotid bifurcation and within the proximal ICA. Stenosis at the origin of the left ICA which is difficult to accurately quantify due to blooming artifact from calcified plaque, but appears greater than 80%. Distal to this, the left ICA is asymmetrically diminutive within the neck as compared to the right ICA. Vertebral arteries: Vertebral arteries patent within the neck without significant stenosis. Skeleton: Please refer to the separately reported CT of the cervical spine for cervical spine findings. There, no acute bony abnormality or aggressive osseous lesion is identified. Other neck: No neck mass or cervical lymphadenopathy. Subcentimeter calcified nodule within the left thyroid lobe not meeting consensus criteria for ultrasound follow-up based on size. Upper chest: No consolidation within the imaged lung apices. Centrilobular emphysema. Review of the MIP images confirms the above findings CTA HEAD FINDINGS Anterior circulation: The intracranial internal carotid arteries are patent. Calcified plaque within both vessels with no more than mild stenosis. The left  M1 middle cerebral artery is occluded at its origin. There is some reconstitution of enhancement within the M2 and more distal left MCA vessels. Over, there is a severe stenosis versus focal occlusion within an inferior division proximal M2 left MCA vessel (series 14, image 25). The M1 right MCA is patent. No right M2 proximal branch occlusion or high-grade proximal stenosis. The anterior cerebral arteries are patent. No intracranial aneurysm is identified. Posterior circulation: The intracranial vertebral arteries are patent. The basilar artery is  patent. The posterior cerebral arteries are patent. Predominantly fetal origin of the right posterior cerebral artery. A sizable left posterior communicating artery is present. Venous sinuses: Within the limitations of contrast timing, no convincing thrombus. Anatomic variants: As described. Review of the MIP images confirms the above findings These results were called by telephone at the time of interpretation on 09/26/2021 at 9:34 pm to provider Davonna Belling , who verbally acknowledged these results. CT head and CTA head impression #1 called by telephone at the time of interpretation on 09/26/2021 at 9:55 pm to provider Davonna Belling , who verbally acknowledged these results. IMPRESSION: CT head: Acute or early subacute infarct measuring 4.8 x 2.7 x 3.7 cm within the left corona radiata, left caudate and lentiform nuclei, left internal capsule and left external capsule. Local mass effect with partial effacement of the left lateral ventricle. No evidence of hemorrhagic conversion. CTA neck: 1. The left common and internal carotid arteries are patent within the neck. Prominent atherosclerotic plaque about the left carotid bifurcation and within the proximal ICA. Exact quantification of stenosis at the origin of the left ICA is difficult due to blooming artifact from calcified plaque at this site, but appears greater than 80%. Consider correlation with a carotid artery  duplex ultrasound. 2. The right common and internal carotid arteries are patent within the neck without hemodynamically significant stenosis. Mild atherosclerotic plaque within the right carotid system within the neck, as described. 3. Vertebral arteries patent within the neck without significant stenosis. 4. Aortic Atherosclerosis (ICD10-I70.0) and Emphysema (ICD10-J43.9). CTA head: 1. 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 There is some reconstitution of enhancement within the M2 and more distal left MCA vessels. However, there is a superimposed severe stenosis versus focal occlusion within an inferior division left M2 MCA vessel. 2. Calcified plaque within the intracranial internal carotid arteries with no more than mild stenosis. Electronically Signed   By: Kellie Simmering D.O.   On: 09/26/2021 21:56   DG Abd 1 View  Result Date: 09/27/2021 CLINICAL DATA:  MRI screening EXAM: ABDOMEN - 1 VIEW COMPARISON:  None. FINDINGS: Unremarkable bowel gas pattern. There is residual contrast within the bladder. No radiopaque foreign body. IMPRESSION: No radiopaque foreign body. Electronically Signed   By: Macy Mis M.D.   On: 09/27/2021 11:31   MR BRAIN WO CONTRAST  Result Date: 09/27/2021 CLINICAL DATA:  Stroke, follow-up. Additional history provided: Provided history: EXAM: MRI HEAD WITHOUT CONTRAST TECHNIQUE: Multiplanar, multiecho pulse sequences of the brain and surrounding structures were obtained without intravenous contrast. COMPARISON:  CT angiogram head/neck 09/26/2021. FINDINGS: Brain: Mild intermittent motion degradation. Redemonstrated acute/early subacute infarct within the left corona radiata, left caudate and lentiform nuclei, left internal capsule and left external capsule. This infarct has slightly increased in size since the CT angiogram head/neck of 09/26/2021, now measuring 5.5 x 2.7 x 3.7 cm (previously 4.8 x 2.7 x 3.7 cm). As before, there is local mass effect with  partial effacement of the left lateral ventricle. No midline shift or evidence of hemorrhagic conversion. Additional small acute/early subacute infarct within the anteromedial left temporal lobe (in retrospect likely present on the prior CTA head of 09/26/2021). There are several additional small acute early/subacute cortically based infarcts within the left temporal, parietal and frontal lobes (left MCA vascular territory). No evidence of an intracranial mass. No extra-axial fluid collection. 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 A severe stenosis  versus focal occlusion was also noted within an inferior division proximal M2 left MCA vessel on this prior exam. Skull and upper cervical spine: No focal suspicious marrow lesion. Sinuses/Orbits: Visualized orbits show no acute finding. Bilateral ocular lens replacements. IMPRESSION: 5.5 x 2.7 x 3.7 cm acute/early subacute infarct within the left corona radiata, basal ganglia, internal capsule and external capsule. This infarct has slightly increased in size since the CTA head/neck of 09/26/2021. As before, there is local mass effect with partial effacement of the left lateral ventricle. No midline shift or evidence of hemorrhagic conversion. Multiple additional small acute/early subacute cortically-based infarcts within the left frontal, parietal and temporal lobes (left MCA vascular territory). Electronically Signed   By: Kellie Simmering D.O.   On: 09/27/2021 12:18   CT C-SPINE NO CHARGE  Result Date: 09/26/2021 CLINICAL DATA:  Found on floor. EXAM: CT CERVICAL SPINE WITHOUT CONTRAST TECHNIQUE: Multidetector CT imaging of the cervical spine was performed without intravenous contrast. Multiplanar CT image reconstructions were also generated. RADIATION DOSE REDUCTION: This exam was performed according to the departmental dose-optimization program which includes automated exposure control,  adjustment of the mA and/or kV according to patient size and/or use of iterative reconstruction technique. COMPARISON:  None. FINDINGS: Alignment: Normal Skull base and vertebrae: No acute fracture. No primary bone lesion or focal pathologic process. Soft tissues and spinal canal: No prevertebral fluid or swelling. No visible canal hematoma. Disc levels: Advanced diffuse degenerative facet disease bilaterally. Upper chest: Emphysema.  No acute abnormality. Other: None IMPRESSION: Advanced diffuse degenerative facet disease. No acute bony abnormality. Electronically Signed   By: Rolm Baptise M.D.   On: 09/26/2021 21:30   DG Chest Portable 1 View  Result Date: 09/26/2021 CLINICAL DATA:  Fall EXAM: PORTABLE CHEST 1 VIEW COMPARISON:  06/02/2021 FINDINGS: Right shoulder replacement. Borderline to mild cardiomegaly. No focal opacity or pleural effusion. No pneumothorax IMPRESSION: No active disease.  Borderline cardiomegaly Electronically Signed   By: Donavan Foil M.D.   On: 09/26/2021 20:57   DG Swallowing Func-Speech Pathology  Result Date: 09/27/2021 Table formatting from the original result was not included. Objective Swallowing Evaluation: Type of Study: MBS-Modified Barium Swallow Study  Patient Details Name: Sheryl [name] MRN: [id number] Date of Birth: 1951-02-21 Today's Date: 09/27/2021 Time: SLP Start Time (ACUTE ONLY): 71 -SLP Stop Time (ACUTE ONLY): 8185 SLP Time Calculation (min) (ACUTE ONLY): 25 min Past Medical History: Past Medical History: Diagnosis Date  ADD (attention deficit disorder)   Allergy   Arthritis   CKD (chronic kidney disease), stage III ([facility])   COPD (chronic obstructive pulmonary disease) ([place]) 12/09/2014  Depression   Does use hearing aid   Dyspnea   on exertion  Hyperlipidemia   Hypertension   LGSIL (low grade squamous intraepithelial dysplasia)   Osteoporosis   Pre-diabetes  Past Surgical History: Past Surgical History: Procedure Laterality Date  JOINT REPLACEMENT    right knee   MULTIPLE TOOTH EXTRACTIONS    REPLACEMENT TOTAL KNEE  2013  right  REVERSE SHOULDER ARTHROPLASTY Right 03/30/2019  Procedure: REVERSE TOTAL SHOULDER ARTHROPLASTY;  Surgeon: Netta Cedars, MD;  Location: [facility];  Service: Orthopedics;  Laterality: Right;  interscalene block  TOTAL KNEE ARTHROPLASTY Left 12/27/2017  TOTAL KNEE ARTHROPLASTY Left 12/27/2017  Procedure: LEFT TOTAL KNEE ARTHROPLASTY;  Surgeon: Netta Cedars, MD;  Location: [place];  Service: Orthopedics;  Laterality: Left; HPI: Pt is a 71 year old woman found down by her daughter in her home. MRI + for large infarct within L  corona radiata, L caudate nucleus, L internal and external capsules with scattered small infarcts in L MCA region. PMH: COPD, current smoker, HTN, HLD, prediabetes, CKD3, ADD, L TKA, R reverse TSA, depression, hearing loss.  Subjective: alert, cooperative  Recommendations for follow up therapy are one component of a multi-disciplinary discharge planning process, led by the attending physician.  Recommendations may be updated based on patient status, additional functional criteria and insurance authorization. Assessment / Plan / Recommendation Clinical Impressions 09/27/2021 Clinical Impression Patient presents with a mild-moderate oropharyngeal dysphagia as per this MBS. During oral phase, patient exhibited decrased anterior to posterior transit of boluses which caused moderate delay with puree solids and mild delay with liquids. During pharyngeal phase, swallow was initiated at level of pyriform sinus with thin liquids and nectar thick liquids and at level of vallecular sinus with puree solids. Patient exhibited trace silent aspiration during the swallow with thin liquids and trace penetration with nectar thick liquids. Retention of barium observed in vallecular sinus with all tested consistencies (trace to mild with puree solids, trace with nectar and thin liquids). SLP is recommending to initiate Dys 1 (puree) solids and nectar thick  liquids diet at this time. SLP Visit Diagnosis Dysphagia, oropharyngeal phase (R13.12) Attention and concentration deficit following -- Frontal lobe and executive function deficit following -- Impact on safety and function Mild aspiration risk;Moderate aspiration risk   Treatment Recommendations 09/27/2021 Treatment Recommendations Therapy as outlined in treatment plan below   Prognosis 09/27/2021 Prognosis for Safe Diet Advancement Good Barriers to Reach Goals -- Barriers/Prognosis Comment -- Diet Recommendations 09/27/2021 SLP Diet Recommendations Dysphagia 1 (Puree) solids;Nectar thick liquid Liquid Administration via Cup Medication Administration Crushed with puree Compensations Slow rate;Small sips/bites;Follow solids with liquid Postural Changes Seated upright at 90 degrees   Other Recommendations 09/27/2021 Recommended Consults -- Oral Care Recommendations Oral care BID;Staff/trained caregiver to provide oral care;Oral care before and after PO Other Recommendations Order thickener from pharmacy;Prohibited food (jello, ice cream, thin soups);Clarify dietary restrictions;Remove water pitcher;Have oral suction available Follow Up Recommendations Acute inpatient rehab (3hours/day) Assistance recommended at discharge Frequent or constant Supervision/Assistance Functional Status Assessment Patient has had a recent decline in their functional status and demonstrates the ability to make significant improvements in function in a reasonable and predictable amount of time. Frequency and Duration  09/27/2021 Speech Therapy Frequency (ACUTE ONLY) min 2x/week Treatment Duration 1 week   Oral Phase 09/27/2021 Oral Phase Impaired Oral - Pudding Teaspoon -- Oral - Pudding Cup -- Oral - Honey Teaspoon -- Oral - Honey Cup -- Oral - Nectar Teaspoon -- Oral - Nectar Cup Reduced posterior propulsion;Delayed oral transit;Weak lingual manipulation Oral - Nectar Straw -- Oral - Thin Teaspoon -- Oral - Thin Cup Weak lingual manipulation;Delayed  oral transit;Reduced posterior propulsion Oral - Thin Straw -- Oral - Puree Delayed oral transit;Reduced posterior propulsion;Weak lingual manipulation Oral - Mech Soft -- Oral - Regular -- Oral - Multi-Consistency -- Oral - Pill -- Oral Phase - Comment --  Pharyngeal Phase 09/27/2021 Pharyngeal Phase Impaired Pharyngeal- Pudding Teaspoon -- Pharyngeal -- Pharyngeal- Pudding Cup -- Pharyngeal -- Pharyngeal- Honey Teaspoon -- Pharyngeal -- Pharyngeal- Honey Cup -- Pharyngeal -- Pharyngeal- Nectar Teaspoon -- Pharyngeal -- Pharyngeal- Nectar Cup Delayed swallow initiation-vallecula;Delayed swallow initiation-pyriform sinuses;Penetration/Aspiration during swallow;Pharyngeal residue - valleculae Pharyngeal Material enters airway, remains ABOVE vocal cords then ejected out Pharyngeal- Nectar Straw -- Pharyngeal -- Pharyngeal- Thin Teaspoon -- Pharyngeal -- Pharyngeal- Thin Cup Delayed swallow initiation-pyriform sinuses;Reduced airway/laryngeal closure;Penetration/Aspiration during swallow;Trace aspiration;Pharyngeal residue - valleculae  Pharyngeal Material enters airway, passes BELOW cords without attempt by patient to eject out (silent aspiration) Pharyngeal- Thin Straw -- Pharyngeal -- Pharyngeal- Puree Delayed swallow initiation-vallecula;Reduced pharyngeal peristalsis;Pharyngeal residue - valleculae Pharyngeal -- Pharyngeal- Mechanical Soft -- Pharyngeal -- Pharyngeal- Regular -- Pharyngeal -- Pharyngeal- Multi-consistency -- Pharyngeal -- Pharyngeal- Pill -- Pharyngeal -- Pharyngeal Comment --  Cervical Esophageal Phase  09/27/2021 Cervical Esophageal Phase Impaired Pudding Teaspoon -- Pudding Cup -- Honey Teaspoon -- Honey Cup -- Nectar Teaspoon -- Nectar Cup -- Nectar Straw -- Thin Teaspoon NT Thin Cup Esophageal backflow into cervical esophagus Thin Straw NT Puree -- Mechanical Soft -- Regular -- Multi-consistency -- Pill -- Cervical Esophageal Comment mild amount of retrograde movement of bolus in cervical  esophagus Sonia Baller, MA, CCC-SLP Speech Therapy                     ECHOCARDIOGRAM COMPLETE  Result Date: 09/28/2021    ECHOCARDIOGRAM REPORT   Patient Name:   Sheryl [name] Date of Exam: 09/28/2021 Medical Rec #:  [id number]       Height:       61.0 in Accession #:    [id number]      Weight:       141.5 lb Date of Birth:  July 21, 1951      BSA:          1.631 m Patient Age:    16 years        BP:           143/65 mmHg Patient Gender: F               HR:           84 bpm. Exam Location:  Inpatient Procedure: 2D Echo Indications:    Stroke  History:        Patient has no prior history of Echocardiogram examinations.                 Signs/Symptoms:Shortness of Breath; Risk Factors:Dyslipidemia                 and Hypertension.  Sonographer:    Arlyss Gandy Referring Phys: [place]  1. Left ventricular ejection fraction, by estimation, is 60 to 65%. The left ventricle has normal function. The left ventricle has no regional wall motion abnormalities. Left ventricular diastolic parameters were normal.  2. Right ventricular systolic function is normal. The right ventricular size is normal.  3. The mitral valve is grossly normal. Mild mitral valve regurgitation. No evidence of mitral stenosis.  4. The aortic valve is grossly normal. Aortic valve regurgitation is not visualized. No aortic stenosis is present.  5. The inferior vena cava is normal in size with greater than 50% respiratory variability, suggesting right atrial pressure of 3 mmHg. Comparison(s): No prior Echocardiogram. Conclusion(s)/Recommendation(s): Normal biventricular function without evidence of hemodynamically significant valvular heart disease. No intracardiac source of embolism detected on this transthoracic study. Consider a transesophageal echocardiogram to exclude cardiac source of embolism if clinically indicated. FINDINGS  Left Ventricle: Left ventricular ejection fraction, by estimation, is 60 to 65%. The left ventricle  has normal function. The left ventricle has no regional wall motion abnormalities. The left ventricular internal cavity size was normal in size. There is  no left ventricular hypertrophy. Left ventricular diastolic parameters were normal. Right Ventricle: The right ventricular size is normal. Right vetricular wall thickness was not well visualized. Right ventricular systolic function is normal. Left Atrium:  Left atrial size was normal in size. Right Atrium: Right atrial size was normal in size. Pericardium: There is no evidence of pericardial effusion. Mitral Valve: The mitral valve is grossly normal. Mild mitral valve regurgitation. No evidence of mitral valve stenosis. Tricuspid Valve: The tricuspid valve is normal in structure. Tricuspid valve regurgitation is trivial. No evidence of tricuspid stenosis. Aortic Valve: The aortic valve is grossly normal. Aortic valve regurgitation is not visualized. No aortic stenosis is present. Aortic valve mean gradient measures 3.0 mmHg. Aortic valve peak gradient measures 6.6 mmHg. Aortic valve area, by VTI measures 2.45 cm. Pulmonic Valve: The pulmonic valve was not well visualized. Pulmonic valve regurgitation is not visualized. No evidence of pulmonic stenosis. Aorta: The aortic root, ascending aorta, aortic arch and descending aorta are all structurally normal, with no evidence of dilitation or obstruction. Venous: The inferior vena cava is normal in size with greater than 50% respiratory variability, suggesting right atrial pressure of 3 mmHg. IAS/Shunts: The atrial septum is grossly normal.  LEFT VENTRICLE PLAX 2D LVIDd:         3.70 cm   Diastology LVIDs:         2.50 cm   LV e' medial:    8.27 cm/s LV PW:         1.00 cm   LV E/e' medial:  9.0 LV IVS:        1.10 cm   LV e' lateral:   11.60 cm/s LVOT diam:     2.00 cm   LV E/e' lateral: 6.4 LV SV:         63 LV SV Index:   39 LVOT Area:     3.14 cm  RIGHT VENTRICLE             IVC RV Basal diam:  2.90 cm     IVC  diam: 1.80 cm RV Mid diam:    2.70 cm RV S prime:     11.10 cm/s TAPSE (M-mode): 2.0 cm LEFT ATRIUM             Index        RIGHT ATRIUM           Index LA diam:        3.00 cm 1.84 cm/m   RA Area:     10.40 cm LA Vol (A2C):   43.0 ml 26.37 ml/m  RA Volume:   20.70 ml  12.69 ml/m LA Vol (A4C):   24.8 ml 15.21 ml/m LA Biplane Vol: 35.8 ml 21.95 ml/m  AORTIC VALVE AV Area (Vmax):    2.00 cm AV Area (Vmean):   2.33 cm AV Area (VTI):     2.45 cm AV Vmax:           128.00 cm/s AV Vmean:          77.800 cm/s AV VTI:            0.258 m AV Peak Grad:      6.6 mmHg AV Mean Grad:      3.0 mmHg LVOT Vmax:         81.60 cm/s LVOT Vmean:        57.800 cm/s LVOT VTI:          0.201 m LVOT/AV VTI ratio: 0.78  AORTA Ao Root diam: 2.70 cm Ao Asc diam:  3.10 cm MITRAL VALVE MV Area (PHT): 4.17 cm    SHUNTS MV Decel Time: 182 msec    Systemic VTI:  0.20 m MV E velocity: 74.10 cm/s  Systemic Diam: 2.00 cm MV A velocity: 79.30 cm/s MV E/A ratio:  0.93 Buford Dresser MD Electronically signed by Buford Dresser MD Signature Date/Time: 09/28/2021/11:36:55 AM    Final    VAS US CAROTID  Result Date: 09/27/2021 Carotid Arterial Duplex Study Patient Name:  Sheryl [name]  Date of Exam:   09/27/2021 Medical Rec #: [id number]        Accession #:    [id number] Date of Birth: [birth year]       Patient Gender: F Patient Age:   59 years Exam Location:  [facility] Procedure:      VAS US CAROTID Referring Phys: Aldona Bar RHYNE --------------------------------------------------------------------------------  Risk Factors:      Hypertension, hyperlipidemia. Comparison Study:  09/26/2021 - CTA neck:                     1. The left common and internal carotid arteries are patent                    within                    the neck. Prominent atherosclerotic plaque about the left                    carotid                    bifurcation and within the proximal ICA. Exact quantification                    of                     stenosis at the origin of the left ICA is difficult due to                    blooming                    artifact from calcified plaque at this site, but appears                    greater                    than 80%. Consider correlation with a carotid artery duplex                    ultrasound.                    2. The right common and internal carotid arteries are patent                    within                    the neck without hemodynamically significant stenosis. Mild                    atherosclerotic plaque within the right carotid system within                    the                    neck, as described.                    3. Vertebral arteries patent within the  neck without                    significant                    stenosis.                    4. Aortic Atherosclerosis (ICD10-I70.0) and Emphysema                    (ICD10-J43.9). Performing Technologist: Oliver Hum RVT  Examination Guidelines: A complete evaluation includes B-mode imaging, spectral Doppler, color Doppler, and power Doppler as needed of all accessible portions of each vessel. Bilateral testing is considered an integral part of a complete examination. Limited examinations for reoccurring indications may be performed as noted.  Right Carotid Findings: +----------+--------+--------+--------+-----------------------+--------+             PSV cm/s EDV cm/s Stenosis Plaque Description      Comments  +----------+--------+--------+--------+-----------------------+--------+  CCA Prox   90       19                smooth and heterogenous           +----------+--------+--------+--------+-----------------------+--------+  CCA Distal 73       21                smooth and heterogenous           +----------+--------+--------+--------+-----------------------+--------+  ICA Prox   74       22                calcific                          +----------+--------+--------+--------+-----------------------+--------+  ICA Distal 111      38                                         tortuous  +----------+--------+--------+--------+-----------------------+--------+  ECA        97       12                                                  +----------+--------+--------+--------+-----------------------+--------+ +----------+--------+-------+--------+-------------------+             PSV cm/s EDV cms Describe Arm Pressure (mmHG)  +----------+--------+-------+--------+-------------------+  Subclavian 150                                            +----------+--------+-------+--------+-------------------+ +---------+--------+--+--------+--+---------+  Vertebral PSV cm/s 88 EDV cm/s 17 Antegrade  +---------+--------+--+--------+--+---------+  Left Carotid Findings: +----------+-------+-------+--------+---------------------------------+--------+             PSV     EDV     Stenosis Plaque Description                Comments              cm/s    cm/s                                                         +----------+-------+-------+--------+---------------------------------+--------+  CCA Prox   71      10               smooth and heterogenous                     +----------+-------+-------+--------+---------------------------------+--------+  CCA Distal 55      5                smooth and heterogenous                     +----------+-------+-------+--------+---------------------------------+--------+  ICA Prox   413     127     80-99%   calcific, irregular and                                                          heterogenous                                +----------+-------+-------+--------+---------------------------------+--------+  ICA Mid    157     33                                                           +----------+-------+-------+--------+---------------------------------+--------+  ICA Distal 63      19                                                 tortuous  +----------+-------+-------+--------+---------------------------------+--------+  ECA         166     18                                                           +----------+-------+-------+--------+---------------------------------+--------+ +----------+--------+--------+--------+-------------------+             PSV cm/s EDV cm/s Describe Arm Pressure (mmHG)  +----------+--------+--------+--------+-------------------+  Subclavian 134                                             +----------+--------+--------+--------+-------------------+ +---------+--------+--+--------+--+---------+  Vertebral PSV cm/s 73 EDV cm/s 17 Antegrade  +---------+--------+--+--------+--+---------+   Summary: Right Carotid: Velocities in the right ICA are consistent with a 1-39% stenosis. Left Carotid: Velocities in the left ICA are consistent with a 80-99% stenosis. Vertebrals: Bilateral vertebral arteries demonstrate antegrade flow. *See table(s) above for measurements and observations.  Electronically signed by Harold Barban MD on 09/27/2021 at 53:54:19 PM.    Final         Hosie Poisson M.D. [facility] 09/28/2021, 5:38 PM  Available via Epic secure chat 7am-7pm After 7 pm, please refer to night coverage provider listed on amion.

## 2021-09-28 NOTE — Progress Notes (Signed)
? ?  Inpatient Rehab Admissions Coordinator : ? ?Per therapy recommendations, patient was screened for CIR candidacy by Teale Goodgame RN MSN.  At this time patient appears to be a potential candidate for CIR. I will place a rehab consult per protocol for full assessment. Please call me with any questions. ? ?Ahriyah Vannest RN MSN ?Admissions Coordinator ?[phone] ?  ?

## 2021-09-29 ENCOUNTER — Inpatient Hospital Stay (HOSPITAL_COMMUNITY): Payer: Medicare PPO

## 2021-09-29 DIAGNOSIS — I6522 Occlusion and stenosis of left carotid artery: Secondary | ICD-10-CM

## 2021-09-29 LAB — CBC WITH DIFFERENTIAL/PLATELET
Abs Immature Granulocytes: 0.03 10*3/uL (ref 0.00–0.07)
Basophils Absolute: 0 10*3/uL (ref 0.0–0.1)
Basophils Relative: 1 %
Eosinophils Absolute: 0.1 10*3/uL (ref 0.0–0.5)
Eosinophils Relative: 1 %
HCT: 31.2 % — ABNORMAL LOW (ref 36.0–46.0)
Hemoglobin: 9.6 g/dL — ABNORMAL LOW (ref 12.0–15.0)
Immature Granulocytes: 0 %
Lymphocytes Relative: 35 %
Lymphs Abs: 2.8 10*3/uL (ref 0.7–4.0)
MCH: 28.1 pg (ref 26.0–34.0)
MCHC: 30.8 g/dL (ref 30.0–36.0)
MCV: 91.2 fL (ref 80.0–100.0)
Monocytes Absolute: 0.8 10*3/uL (ref 0.1–1.0)
Monocytes Relative: 10 %
Neutro Abs: 4.3 10*3/uL (ref 1.7–7.7)
Neutrophils Relative %: 53 %
Platelets: 360 10*3/uL (ref 150–400)
RBC: 3.42 MIL/uL — ABNORMAL LOW (ref 3.87–5.11)
RDW: 15.6 % — ABNORMAL HIGH (ref 11.5–15.5)
WBC: 8 10*3/uL (ref 4.0–10.5)
nRBC: 0 % (ref 0.0–0.2)

## 2021-09-29 LAB — BASIC METABOLIC PANEL
Anion gap: 10 (ref 5–15)
BUN: 30 mg/dL — ABNORMAL HIGH (ref 8–23)
CO2: 25 mmol/L (ref 22–32)
Calcium: 9.2 mg/dL (ref 8.9–10.3)
Chloride: 114 mmol/L — ABNORMAL HIGH (ref 98–111)
Creatinine, Ser: 1.21 mg/dL — ABNORMAL HIGH (ref 0.44–1.00)
GFR, Estimated: 48 mL/min — ABNORMAL LOW (ref >60)
Glucose, Bld: 107 mg/dL — ABNORMAL HIGH (ref 70–99)
Potassium: 3.7 mmol/L (ref 3.5–5.1)
Sodium: 149 mmol/L — ABNORMAL HIGH (ref 135–145)

## 2021-09-29 LAB — CK: Total CK: 142 U/L (ref 38–234)

## 2021-09-29 NOTE — Assessment & Plan Note (Signed)
°  LDL is 87 Continue with crestor 40 mg daily

## 2021-09-29 NOTE — Assessment & Plan Note (Signed)
No wheezing heard on exam.  Continue with bronchodilators as needed.

## 2021-09-29 NOTE — Assessment & Plan Note (Signed)
Gently hydrated. CK Wnl.

## 2021-09-29 NOTE — Assessment & Plan Note (Signed)
Add nicotine patch.

## 2021-09-29 NOTE — Progress Notes (Addendum)
°  Progress Note    09/29/2021 9:40 AM * No surgery found *  Subjective:  working with PT during my exam.  Is able to stand at the sink.  Speech improving   Vitals:   09/29/21 0349 09/29/21 0754  BP: (!) 146/77 124/81  Pulse: 88 73  Resp: (!) 22 (!) 21  Temp: (!) 97.5 F (36.4 C) 98 F (36.7 C)  SpO2: 93% 97%   Physical Exam: Lungs:  non labored Neurologic: R sided weakness and speech deficit  CBC    Component Value Date/Time   WBC 8.0 09/29/2021 0047   RBC 3.42 (L) 09/29/2021 0047   HGB 9.6 (L) 09/29/2021 0047   HCT 31.2 (L) 09/29/2021 0047   PLT 360 09/29/2021 0047   MCV 91.2 09/29/2021 0047   MCV 89.9 02/14/2016 1411   MCH 28.1 09/29/2021 0047   MCHC 30.8 09/29/2021 0047   RDW 15.6 (H) 09/29/2021 0047   LYMPHSABS 2.8 09/29/2021 0047   MONOABS 0.8 09/29/2021 0047   EOSABS 0.1 09/29/2021 0047   BASOSABS 0.0 09/29/2021 0047    BMET    Component Value Date/Time   NA 149 (H) 09/29/2021 0047   NA 138 02/18/2017 1157   K 3.7 09/29/2021 0047   CL 114 (H) 09/29/2021 0047   CO2 25 09/29/2021 0047   GLUCOSE 107 (H) 09/29/2021 0047   BUN 30 (H) 09/29/2021 0047   BUN 14 02/18/2017 1157   CREATININE 1.21 (H) 09/29/2021 0047   CREATININE 1.32 (H) 04/12/2020 1434   CALCIUM 9.2 09/29/2021 0047   GFRNONAA 48 (L) 09/29/2021 0047   GFRAA >60 04/02/2019 0811    INR    Component Value Date/Time   INR 1.1 09/26/2021 2239     Intake/Output Summary (Last 24 hours) at 09/29/2021 0940 Last data filed at 09/29/2021 0530 Gross per 24 hour  Intake 360 ml  Output 1050 ml  Net -690 ml     Assessment/Plan:  71 y.o. female with symptomatic L carotid stenosis   No further R sided weakness; speech improving Continue aspirin, plavix, and statin Plan is for L TCAR in about 2-3 weeks   Dagoberto Ligas, PA-C [facility] [phone] 09/29/2021 9:40 AM   I agree with the above.  I have seen and evaluated the patient.  She has made some progress  verbally.  She has had some increased movement in her right leg but not in her right arm.  I discussed with the patient and her family that I will see her back in the office in approximately 2 weeks, to determine timing of intervention on her left carotid.  We discussed focusing on rehab in the short-term.  She can be discharged from my perspective.  Annamarie Major

## 2021-09-29 NOTE — Assessment & Plan Note (Signed)
permissive hypertension . Restart amlodipine 5 mg daily and cozaar 100 mg daily.

## 2021-09-29 NOTE — Assessment & Plan Note (Signed)
Creatinine appears to be at baseline, continue to monitor.

## 2021-09-29 NOTE — Progress Notes (Signed)
Occupational Therapy Treatment Patient Details Name: Sheryl [name] MRN: [id number] DOB: 01/16/1951 Today's Date: 09/29/2021   History of present illness Pt is a 71 year old woman found down by her daughter in her home, admitted on 2/7. 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 PMH: COPD, current smoker, HTN, HLD, prediabetes, CKD3, ADD, L TKA, R reverse TSA, depression, hearling loss.   OT comments  Patient received in bed and had soiled self. Patient assisted with cleaning by rolling side to side mod assist to left and min assist to right.  Patient assisted with doffing and donning gown with LUE.  Standing attempted from EOB with handheld but was unable to stabilize RLE.  Stedy used for transfers and to perform standing at sink for grooming tasks. Patient setup for self feeding and was impulsive and eating at an unsafe rate with SLP notified. Discharge recommendation changed to SNF from [facility].    Recommendations for follow up therapy are one component of a multi-disciplinary discharge planning process, led by the attending physician.  Recommendations may be updated based on patient status, additional functional criteria and insurance authorization.    Follow Up Recommendations  Skilled nursing-short term rehab (<3 hours/day)    Assistance Recommended at Discharge Frequent or constant Supervision/Assistance  Patient can return home with the following  Two people to help with walking and/or transfers;Two people to help with bathing/dressing/bathroom;Assistance with cooking/housework;Assistance with feeding;Direct supervision/assist for medications management;Direct supervision/assist for financial management;Assist for transportation;Help with stairs or ramp for entrance   Equipment Recommendations  Other (comment) (TBD)    Recommendations for Other Services      Precautions /  Restrictions Precautions Precautions: Fall Precaution Comments: R hemiparesis Restrictions Weight Bearing Restrictions: No Other Position/Activity Restrictions: R LE weakness with all mobility       Mobility Bed Mobility Overal bed mobility: Needs Assistance Bed Mobility: Supine to Sit, Rolling Rolling: Mod assist, Min assist   Supine to sit: Mod assist     General bed mobility comments: mod A for rolling up onto L hip for pericare, min A for rolling R, maximal multimodal cuing for sequencing moving into upright, follows about 25% of commands    Transfers Overall transfer level: Needs assistance Equipment used: 2 person hand held assist Transfers: Sit to/from Stand Sit to Stand: +2 physical assistance, Mod assist, Max assist           General transfer comment: 1st attempt to stand pt with decreased R sided activation, pt with hx of R knee TKA and decreased extension at baseline making it difficulty coming to fully upright, pt is modA for power up to standing but max A to maintain and short sits without notice. second attempt with Stedy much better, pt with lack of understanding about sitting on Stedy pads, frequently pushing up into standing while moving to sink for self care, increased R lateral lean which pt able to bring body to upright however lacks attention span to maintain     Balance Overall balance assessment: Needs assistance   Sitting balance-Leahy Scale: Poor Sitting balance - Comments: min to min guard assist Postural control: Posterior lean, Right lateral lean Standing balance support: Bilateral upper extremity supported, During functional activity Standing balance-Leahy Scale: Poor Standing balance comment: unable to maintain grip on Stedy with R UE but able to hold herself up with strong L UE  ADL either performed or assessed with clinical judgement   ADL Overall ADL's : Needs assistance/impaired     Grooming:  Minimal assistance;Sitting;Standing Grooming Details (indicate cue type and reason): performed seated on stedy pads at sink and stood for part of grooming tasks     Lower Body Bathing: Maximal assistance;+2 for physical assistance;Bed level Lower Body Bathing Details (indicate cue type and reason): patient rolled in bed with assistance of one for rolling and assistance of another to assist with bathing Upper Body Dressing : Moderate assistance;Bed level Upper Body Dressing Details (indicate cue type and reason): gown change at bed level                   General ADL Comments: performed gown change and LB bathing at bed level due to patient had soiled herself. Stood at sink with Stedy and sat on stedy pads for grooming tasks    Extremity/Trunk Assessment Upper Extremity Assessment RUE Deficits / Details: dense R hemiplegia, pt reports equal sensation B UEs RUE Coordination: decreased fine motor;decreased gross motor            Vision       Perception     Praxis      Cognition Arousal/Alertness: Awake/alert, Lethargic Behavior During Therapy: Impulsive (begiins tasks before she is prompted; stands before, eating meal too quickly) Overall Cognitive Status: Difficult to assess                                 General Comments: Pt unaware of being soiled, impusively moves despite multimodal cues for sequencing, very agreeable and indicates that she knows that she needs assist for movement, but unlikely this is true        Exercises      Shoulder Instructions       General Comments Pt has some movement of R UE and LE however is unable to attend to long enough for purposeful movement    Pertinent Vitals/ Pain       Pain Assessment Pain Assessment: No/denies pain Faces Pain Scale: No hurt  Home Living                                          Prior Functioning/Environment              Frequency  Min 3X/week         Progress Toward Goals  OT Goals(current goals can now be found in the care plan section)  Progress towards OT goals: Progressing toward goals  Acute Rehab OT Goals OT Goal Formulation: With patient/family Time For Goal Achievement: 10/11/21 Potential to Achieve Goals: Good ADL Goals Pt Will Perform Eating: sitting;with supervision Pt Will Perform Grooming: with set-up;sitting Pt Will Perform Upper Body Bathing: sitting;with min assist Pt Will Perform Upper Body Dressing: with min assist;sitting Pt Will Transfer to Toilet: with mod assist;stand pivot transfer;bedside commode Additional ADL Goal #1: Pt will demonstrate fair sitting balance x 10 minutes in preparation for ADL. Additional ADL Goal #2: Pt will perform bed mobility with min assist in preparation for ADL. Additional ADL Goal #3: Pt will protect R UE from injury during mobility.  Plan Discharge plan remains appropriate    Co-evaluation    PT/OT/SLP Co-Evaluation/Treatment: Yes Reason for Co-Treatment: For patient/therapist safety;To address functional/ADL transfers PT goals addressed  during session: Mobility/safety with mobility OT goals addressed during session: ADL's and self-care      AM-PAC OT "6 Clicks" Daily Activity     Outcome Measure   Help from another person eating meals?: A Little Help from another person taking care of personal grooming?: A Little Help from another person toileting, which includes using toliet, bedpan, or urinal?: Total Help from another person bathing (including washing, rinsing, drying)?: A Lot Help from another person to put on and taking off regular upper body clothing?: A Lot Help from another person to put on and taking off regular lower body clothing?: Total 6 Click Score: 12    End of Session Equipment Utilized During Treatment: Gait belt;Other (comment) Charlaine Dalton)  OT Visit Diagnosis: Unsteadiness on feet (R26.81);Hemiplegia and hemiparesis Hemiplegia - Right/Left:  Right Hemiplegia - dominant/non-dominant: Non-Dominant Hemiplegia - caused by: Cerebral infarction   Activity Tolerance Patient tolerated treatment well   Patient Left in chair;with call bell/phone within reach   Nurse Communication Mobility status;Other (comment);Need for lift equipment (need for supervision for feeding)        Time: 0349-1791 OT Time Calculation (min): 42 min  Charges: OT General Charges $OT Visit: 1 Visit OT Treatments $Self Care/Home Management : 8-22 mins  Lodema Hong, [place]  Pager [phone] Office [place] 09/29/2021, 12:35 PM

## 2021-09-29 NOTE — Progress Notes (Signed)
[facility]                                                                               Kelsea Fiorenza, is a 71 y.o. female, DOB - [birth year], HCW:[id number] Admit date - 09/26/2021    Outpatient Primary MD for the patient is Burns, Claudina Lick, MD  LOS - 3  days    Brief summary   71 year old female with a history of hyperlipidemia, COPD, prediabetes, CKD stage III, chronic tobacco abuse, ADD presents to the ER today after being found down on the floor.  Last known normal was about 48 hours ago.  Family talk to her on Sunday night.  No contact with her on Monday.  Patient would not answer the phone until today.  EMS was called.  Patient was found on the ground.  Brought to the ER.  Patient is left-hand dominant.  She has a dense right upper extremity parapsoas.  CT scan demonstrated a large infarct of the left corona radiata, left caudate nucleus, left internal capsule and left external capsule.  Local mass effect with effacement of the left lateral ventricle. Neurology consulted and recommendations given.   Assessment & Plan    Assessment and Plan: * Acute ischemic left MCA stroke ([place])- (present on admission) Improving mental status.  Started her on aspirin 325 mg dialy and plavix 75 mg daily.  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 MRI large left BG/CR infarct Carotid Doppler left ICA 80 to 99% stenosis Echocardiogram is unremarkable.  Therapy evaluations are recommending CIR.  Vascular surgery consulted for left ICA stenosis. Plan for TCAR.  Continue with plavix , aspirin and statin.  SLP evaluation recommending Dysphagia 1 diet.   Hyperlipidemia- (present on admission)  LDL is 87 Continue with crestor 40 mg daily  Tobacco abuse- (present on admission) Add nicotine patch.  Rhabdomyolysis Gently hydrated. CK Wnl.   GERD (gastroesophageal reflux  disease)- (present on admission) Stable.   Stage 3a chronic kidney disease (CKD) (HCC) - baseline SCr 1.1-1.3- (present on admission) Creatinine appears to be at baseline, continue to monitor.   Prediabetes- (present on admission) Last A1c is 5.5%  COPD (chronic obstructive pulmonary disease) ([place])- (present on admission) No wheezing heard on exam.  Continue with bronchodilators as needed.   ADD (attention deficit disorder)- (present on admission) stable  HTN (hypertension)- (present on admission) permissive hypertension . Restart amlodipine 5 mg daily and cozaar 100 mg daily.       Estimated body mass index is 26.74 kg/m as calculated from the following:   Height as of this encounter: 5\' 1"  (1.549 m).   Weight as of this encounter: 64.2 kg.  Code Status: FULL CODE.  DVT Prophylaxis:  enoxaparin (LOVENOX) injection 30 mg Start: 09/27/21 1700 SCD's Start: 09/26/21 2337   Level of Care: Level of care: Progressive Family Communication: Updated patient's family at bedside.   Disposition Plan:     Remains inpatient appropriate: CIR placement.   Procedures:  MRI brain CTA HEAD AND NECK.  CAROTID DUPLEX  Consultants:   Neurology Vascular surgery.  Antimicrobials:   Anti-infectives (From admission, onward)    None        Medications  Scheduled Meds:  aspirin  300 mg Rectal Daily   Or   aspirin EC  325 mg Oral Daily   clopidogrel  75 mg Oral Daily   enoxaparin (LOVENOX) injection  30 mg Subcutaneous Q24H   mouth rinse  15 mL Mouth Rinse BID   nicotine  14 mg Transdermal Daily   rosuvastatin  40 mg Oral Daily   Continuous Infusions:   PRN Meds:.acetaminophen **OR** acetaminophen (TYLENOL) oral liquid 160 mg/5 mL **OR** acetaminophen, food thickener    Subjective:   Astryd Mckay was seen and examined today. Alert and comfortable. Not in distress. No new complaints.   Objective:   Vitals:   09/28/21 2023 09/28/21 2313 09/29/21 0349 09/29/21  0754  BP: (!) 146/74 (!) 150/90 (!) 146/77 124/81  Pulse: (!) 101 97 88 73  Resp: 19 (!) 25 (!) 22 (!) 21  Temp: 97.9 F (36.6 C) 98.2 F (36.8 C) (!) 97.5 F (36.4 C) 98 F (36.7 C)  TempSrc: Oral Oral Oral Axillary  SpO2: 94% 93% 93% 97%  Weight:      Height:        Intake/Output Summary (Last 24 hours) at 09/29/2021 0814 Last data filed at 09/29/2021 0530 Gross per 24 hour  Intake 360 ml  Output 1050 ml  Net -690 ml   Filed Weights   09/27/21 2146  Weight: 64.2 kg     General exam: Appears calm and comfortable  Respiratory system: Clear to auscultation. Respiratory effort normal. Cardiovascular system: S1 & S2 heard, RRR. No JVD, murmurs, No pedal edema. Gastrointestinal system: Abdomen is nondistended, soft and nontender.  Normal bowel sounds heard. Central nervous system: Alert , following simple commands. Right sided weakness improving.  Extremities: Symmetric 5 x 5 power. Skin: No rashes, lesions or ulcers Psychiatry: unable to assess.    Data Reviewed:  I have personally reviewed following labs and imaging studies   CBC Lab Results  Component Value Date   WBC 14.8 (H) 09/26/2021   RBC 3.75 (L) 09/26/2021   HGB 10.3 (L) 09/26/2021   HCT 34.4 (L) 09/26/2021   MCV 91.7 09/26/2021   MCH 27.5 09/26/2021   PLT 400 09/26/2021   MCHC 29.9 (L) 09/26/2021   RDW 14.8 09/26/2021   LYMPHSABS 1.4 09/26/2021   MONOABS 1.0 09/26/2021   EOSABS 0.0 09/26/2021   BASOSABS 0.1 36/64/4034     Last metabolic panel Lab Results  Component Value Date   NA 143 09/26/2021   K 4.0 09/26/2021   CL 108 09/26/2021   CO2 17 (L) 09/26/2021   BUN 32 (H) 09/26/2021   CREATININE 1.24 (H) 09/26/2021   GLUCOSE 101 (H) 09/26/2021   GFRNONAA 47 (L) 09/26/2021   GFRAA >60 04/02/2019   CALCIUM 9.8 09/26/2021   PROT 8.3 (H) 09/26/2021   ALBUMIN 3.8 09/26/2021   LABGLOB 3.2 02/18/2017   AGRATIO 1.3 02/18/2017   BILITOT 0.5 09/26/2021   ALKPHOS 142 (H) 09/26/2021   AST 140 (H)  09/26/2021   ALT 76 (H) 09/26/2021   ANIONGAP 18 (H) 09/26/2021    CBG (last 3)  No results for input(s): GLUCAP in the last 72 hours.    Coagulation Profile: Recent Labs  Lab 09/26/21 2239  INR 1.1     Radiology Studies: DG Abd 1 View  Result Date: 09/27/2021 CLINICAL DATA:  MRI screening EXAM: ABDOMEN - 1 VIEW COMPARISON:  None. FINDINGS: Unremarkable bowel gas pattern. There is residual contrast within the bladder. No radiopaque foreign body. IMPRESSION: No radiopaque foreign body. Electronically Signed   By: Macy Mis M.D.   On: 09/27/2021 11:31   MR BRAIN WO CONTRAST  Result Date: 09/27/2021 CLINICAL DATA:  Stroke, follow-up. Additional history provided: Provided history: EXAM: MRI HEAD WITHOUT CONTRAST TECHNIQUE: Multiplanar, multiecho pulse sequences of the brain and surrounding structures were obtained without intravenous contrast. COMPARISON:  CT angiogram head/neck 09/26/2021. FINDINGS: Brain: Mild intermittent motion degradation. Redemonstrated acute/early subacute infarct within the left corona radiata, left caudate and lentiform nuclei, left internal capsule and left external capsule. This infarct has slightly increased in size since the CT angiogram head/neck of 09/26/2021, now measuring 5.5 x 2.7 x 3.7 cm (previously 4.8 x 2.7 x 3.7 cm). As before, there is local mass effect with partial effacement of the left lateral ventricle. No midline shift or evidence of hemorrhagic conversion. Additional small acute/early subacute infarct within the anteromedial left temporal lobe (in retrospect likely present on the prior CTA head of 09/26/2021). There are several additional small acute early/subacute cortically based infarcts within the left temporal, parietal and frontal lobes (left MCA vascular territory). No evidence of an intracranial mass. No extra-axial fluid collection. Vascular: Occlusion of the M1 left middle cerebral artery was noted on the CTA head/neck performed  yesterday 09/26/2021. A severe stenosis versus focal occlusion was also noted within an inferior division proximal M2 left MCA vessel on this prior exam. Skull and upper cervical spine: No focal suspicious marrow lesion. Sinuses/Orbits: Visualized orbits show no acute finding. Bilateral ocular lens replacements. IMPRESSION: 5.5 x 2.7 x 3.7 cm acute/early subacute infarct within the left corona radiata, basal ganglia, internal capsule and external capsule. This infarct has slightly increased in size since the CTA head/neck of 09/26/2021. As before, there is local mass effect with partial effacement of the left lateral ventricle. No midline shift or evidence of hemorrhagic conversion. Multiple additional small acute/early subacute cortically-based infarcts within the left frontal, parietal and temporal lobes (left MCA vascular territory). Electronically Signed   By: Kellie Simmering D.O.   On: 09/27/2021 12:18   DG Swallowing Func-Speech Pathology  Result Date: 09/27/2021 Table formatting from the original result was not included. Objective Swallowing Evaluation: Type of Study: MBS-Modified Barium Swallow Study  Patient Details Name: NAYLEEN JANOSIK MRN: [id number] Date of Birth: 06/09/1951 Today's Date: 09/27/2021 Time: SLP Start Time (ACUTE ONLY): 53 -SLP Stop Time (ACUTE ONLY): 6384 SLP Time Calculation (min) (ACUTE ONLY): 25 min Past Medical History: Past Medical History: Diagnosis Date  ADD (attention deficit disorder)   Allergy   Arthritis   CKD (chronic kidney disease), stage III (HCC)   COPD (chronic obstructive pulmonary disease) ([place]) 12/09/2014  Depression   Does use hearing aid   Dyspnea   on exertion  Hyperlipidemia   Hypertension   LGSIL (low grade squamous intraepithelial dysplasia)   Osteoporosis   Pre-diabetes  Past Surgical History: Past Surgical History: Procedure Laterality Date  JOINT REPLACEMENT    right knee  MULTIPLE TOOTH EXTRACTIONS    REPLACEMENT TOTAL KNEE  2013  right  REVERSE SHOULDER  ARTHROPLASTY Right 03/30/2019  Procedure: REVERSE TOTAL SHOULDER ARTHROPLASTY;  Surgeon: Netta Cedars, MD;  Location: [facility];  Service: Orthopedics;  Laterality: Right;  interscalene block  TOTAL KNEE ARTHROPLASTY Left 12/27/2017  TOTAL KNEE ARTHROPLASTY Left 12/27/2017  Procedure: LEFT TOTAL KNEE ARTHROPLASTY;  Surgeon: Netta Cedars, MD;  Location: [facility];  Service: Orthopedics;  Laterality: Left; HPI: Pt is a 71 year old woman found down by her daughter in her home. 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 PMH: COPD, current smoker, HTN, HLD, prediabetes, CKD3, ADD, L TKA, R reverse TSA, depression, hearing loss.  Subjective: alert, cooperative  Recommendations for follow up therapy are one component of a multi-disciplinary discharge planning process, led by the attending physician.  Recommendations may be updated based on patient status, additional functional criteria and insurance authorization. Assessment / Plan / Recommendation Clinical Impressions 09/27/2021 Clinical Impression Patient presents with a mild-moderate oropharyngeal dysphagia as per this MBS. During oral phase, patient exhibited decrased anterior to posterior transit of boluses which caused moderate delay with puree solids and mild delay with liquids. During pharyngeal phase, swallow was initiated at level of pyriform sinus with thin liquids and nectar thick liquids and at level of vallecular sinus with puree solids. Patient exhibited trace silent aspiration during the swallow with thin liquids and trace penetration with nectar thick liquids. Retention of barium observed in vallecular sinus with all tested consistencies (trace to mild with puree solids, trace with nectar and thin liquids). SLP is recommending to initiate Dys 1 (puree) solids and nectar thick liquids diet at this time. SLP Visit Diagnosis Dysphagia, oropharyngeal phase (R13.12)  Attention and concentration deficit following -- Frontal lobe and executive function deficit following -- Impact on safety and function Mild aspiration risk;Moderate aspiration risk   Treatment Recommendations 09/27/2021 Treatment Recommendations Therapy as outlined in treatment plan below   Prognosis 09/27/2021 Prognosis for Safe Diet Advancement Good Barriers to Reach Goals -- Barriers/Prognosis Comment -- Diet Recommendations 09/27/2021 SLP Diet Recommendations Dysphagia 1 (Puree) solids;Nectar thick liquid Liquid Administration via Cup Medication Administration Crushed with puree Compensations Slow rate;Small sips/bites;Follow solids with liquid Postural Changes Seated upright at 90 degrees   Other Recommendations 09/27/2021 Recommended Consults -- Oral Care Recommendations Oral care BID;Staff/trained caregiver to provide oral care;Oral care before and after PO Other Recommendations Order thickener from pharmacy;Prohibited food (jello, ice cream, thin soups);Clarify dietary restrictions;Remove water pitcher;Have oral suction available Follow Up Recommendations Acute inpatient rehab (3hours/day) Assistance recommended at discharge Frequent or constant Supervision/Assistance Functional Status Assessment Patient has had a recent decline in their functional status and demonstrates the ability to make significant improvements in function in a reasonable and predictable amount of time. Frequency and Duration  09/27/2021 Speech Therapy Frequency (ACUTE ONLY) min 2x/week Treatment Duration 1 week   Oral Phase 09/27/2021 Oral Phase Impaired Oral - Pudding Teaspoon -- Oral - Pudding Cup -- Oral - Honey Teaspoon -- Oral - Honey Cup -- Oral - Nectar Teaspoon -- Oral - Nectar Cup Reduced posterior propulsion;Delayed oral transit;Weak lingual manipulation Oral - Nectar Straw -- Oral - Thin Teaspoon -- Oral - Thin Cup Weak lingual manipulation;Delayed oral transit;Reduced posterior propulsion Oral - Thin Straw -- Oral - Puree Delayed oral  transit;Reduced posterior propulsion;Weak lingual manipulation Oral - Mech Soft -- Oral - Regular -- Oral - Multi-Consistency -- Oral - Pill -- Oral Phase - Comment --  Pharyngeal Phase 09/27/2021 Pharyngeal Phase Impaired Pharyngeal- Pudding Teaspoon -- Pharyngeal -- Pharyngeal- Pudding Cup -- Pharyngeal -- Pharyngeal- Honey Teaspoon -- Pharyngeal -- Pharyngeal- Honey Cup -- Pharyngeal -- Pharyngeal- Nectar Teaspoon -- Pharyngeal -- Pharyngeal- Nectar Cup Delayed swallow initiation-vallecula;Delayed swallow initiation-pyriform sinuses;Penetration/Aspiration during swallow;Pharyngeal residue - valleculae Pharyngeal Material enters airway, remains ABOVE vocal cords then ejected out Pharyngeal- Nectar  Straw -- Pharyngeal -- Pharyngeal- Thin Teaspoon -- Pharyngeal -- Pharyngeal- Thin Cup Delayed swallow initiation-pyriform sinuses;Reduced airway/laryngeal closure;Penetration/Aspiration during swallow;Trace aspiration;Pharyngeal residue - valleculae Pharyngeal Material enters airway, passes BELOW cords without attempt by patient to eject out (silent aspiration) Pharyngeal- Thin Straw -- Pharyngeal -- Pharyngeal- Puree Delayed swallow initiation-vallecula;Reduced pharyngeal peristalsis;Pharyngeal residue - valleculae Pharyngeal -- Pharyngeal- Mechanical Soft -- Pharyngeal -- Pharyngeal- Regular -- Pharyngeal -- Pharyngeal- Multi-consistency -- Pharyngeal -- Pharyngeal- Pill -- Pharyngeal -- Pharyngeal Comment --  Cervical Esophageal Phase  09/27/2021 Cervical Esophageal Phase Impaired Pudding Teaspoon -- Pudding Cup -- Honey Teaspoon -- Honey Cup -- Nectar Teaspoon -- Nectar Cup -- Nectar Straw -- Thin Teaspoon NT Thin Cup Esophageal backflow into cervical esophagus Thin Straw NT Puree -- Mechanical Soft -- Regular -- Multi-consistency -- Pill -- Cervical Esophageal Comment mild amount of retrograde movement of bolus in cervical esophagus Sonia Baller, MA, CCC-SLP Speech Therapy                     ECHOCARDIOGRAM  COMPLETE  Result Date: 09/28/2021    ECHOCARDIOGRAM REPORT   Patient Name:   THETIS SCHWIMMER Date of Exam: 09/28/2021 Medical Rec #:  [id number]       Height:       61.0 in Accession #:    [id number]      Weight:       141.5 lb Date of Birth:  [birth year]      BSA:          1.631 m Patient Age:    73 years        BP:           143/65 mmHg Patient Gender: F               HR:           84 bpm. Exam Location:  Inpatient Procedure: 2D Echo Indications:    Stroke  History:        Patient has no prior history of Echocardiogram examinations.                 Signs/Symptoms:Shortness of Breath; Risk Factors:Dyslipidemia                 and Hypertension.  Sonographer:    Arlyss Gandy Referring Phys: [place]  1. Left ventricular ejection fraction, by estimation, is 60 to 65%. The left ventricle has normal function. The left ventricle has no regional wall motion abnormalities. Left ventricular diastolic parameters were normal.  2. Right ventricular systolic function is normal. The right ventricular size is normal.  3. The mitral valve is grossly normal. Mild mitral valve regurgitation. No evidence of mitral stenosis.  4. The aortic valve is grossly normal. Aortic valve regurgitation is not visualized. No aortic stenosis is present.  5. The inferior vena cava is normal in size with greater than 50% respiratory variability, suggesting right atrial pressure of 3 mmHg. Comparison(s): No prior Echocardiogram. Conclusion(s)/Recommendation(s): Normal biventricular function without evidence of hemodynamically significant valvular heart disease. No intracardiac source of embolism detected on this transthoracic study. Consider a transesophageal echocardiogram to exclude cardiac source of embolism if clinically indicated. FINDINGS  Left Ventricle: Left ventricular ejection fraction, by estimation, is 60 to 65%. The left ventricle has normal function. The left ventricle has no regional wall motion abnormalities. The left  ventricular internal cavity size was normal in size. There is  no left ventricular hypertrophy. Left ventricular diastolic parameters were  normal. Right Ventricle: The right ventricular size is normal. Right vetricular wall thickness was not well visualized. Right ventricular systolic function is normal. Left Atrium: Left atrial size was normal in size. Right Atrium: Right atrial size was normal in size. Pericardium: There is no evidence of pericardial effusion. Mitral Valve: The mitral valve is grossly normal. Mild mitral valve regurgitation. No evidence of mitral valve stenosis. Tricuspid Valve: The tricuspid valve is normal in structure. Tricuspid valve regurgitation is trivial. No evidence of tricuspid stenosis. Aortic Valve: The aortic valve is grossly normal. Aortic valve regurgitation is not visualized. No aortic stenosis is present. Aortic valve mean gradient measures 3.0 mmHg. Aortic valve peak gradient measures 6.6 mmHg. Aortic valve area, by VTI measures 2.45 cm. Pulmonic Valve: The pulmonic valve was not well visualized. Pulmonic valve regurgitation is not visualized. No evidence of pulmonic stenosis. Aorta: The aortic root, ascending aorta, aortic arch and descending aorta are all structurally normal, with no evidence of dilitation or obstruction. Venous: The inferior vena cava is normal in size with greater than 50% respiratory variability, suggesting right atrial pressure of 3 mmHg. IAS/Shunts: The atrial septum is grossly normal.  LEFT VENTRICLE PLAX 2D LVIDd:         3.70 cm   Diastology LVIDs:         2.50 cm   LV e' medial:    8.27 cm/s LV PW:         1.00 cm   LV E/e' medial:  9.0 LV IVS:        1.10 cm   LV e' lateral:   11.60 cm/s LVOT diam:     2.00 cm   LV E/e' lateral: 6.4 LV SV:         63 LV SV Index:   39 LVOT Area:     3.14 cm  RIGHT VENTRICLE             IVC RV Basal diam:  2.90 cm     IVC diam: 1.80 cm RV Mid diam:    2.70 cm RV S prime:     11.10 cm/s TAPSE (M-mode): 2.0 cm LEFT  ATRIUM             Index        RIGHT ATRIUM           Index LA diam:        3.00 cm 1.84 cm/m   RA Area:     10.40 cm LA Vol (A2C):   43.0 ml 26.37 ml/m  RA Volume:   20.70 ml  12.69 ml/m LA Vol (A4C):   24.8 ml 15.21 ml/m LA Biplane Vol: 35.8 ml 21.95 ml/m  AORTIC VALVE AV Area (Vmax):    2.00 cm AV Area (Vmean):   2.33 cm AV Area (VTI):     2.45 cm AV Vmax:           128.00 cm/s AV Vmean:          77.800 cm/s AV VTI:            0.258 m AV Peak Grad:      6.6 mmHg AV Mean Grad:      3.0 mmHg LVOT Vmax:         81.60 cm/s LVOT Vmean:        57.800 cm/s LVOT VTI:          0.201 m LVOT/AV VTI ratio: 0.78  AORTA Ao Root diam: 2.70 cm Ao Asc diam:  3.10 cm MITRAL VALVE MV Area (PHT): 4.17 cm    SHUNTS MV Decel Time: 182 msec    Systemic VTI:  0.20 m MV E velocity: 74.10 cm/s  Systemic Diam: 2.00 cm MV A velocity: 79.30 cm/s MV E/A ratio:  0.93 Buford Dresser MD Electronically signed by Buford Dresser MD Signature Date/Time: 09/28/2021/11:36:55 AM    Final    VAS US CAROTID  Result Date: 09/27/2021 Carotid Arterial Duplex Study Patient Name:  DANIELA SIEBERS  Date of Exam:   09/27/2021 Medical Rec #: [id number]        Accession #:    [id number] Date of Birth: October 13, 1950       Patient Gender: F Patient Age:   63 years Exam Location:  [facility] Procedure:      VAS US CAROTID Referring Phys: Aldona Bar RHYNE --------------------------------------------------------------------------------  Risk Factors:      Hypertension, hyperlipidemia. Comparison Study:  09/26/2021 - CTA neck:                     1. The left common and internal carotid arteries are patent                    within                    the neck. Prominent atherosclerotic plaque about the left                    carotid                    bifurcation and within the proximal ICA. Exact quantification                    of                    stenosis at the origin of the left ICA is difficult due to                    blooming                     artifact from calcified plaque at this site, but appears                    greater                    than 80%. Consider correlation with a carotid artery duplex                    ultrasound.                    2. The right common and internal carotid arteries are patent                    within                    the neck without hemodynamically significant stenosis. Mild                    atherosclerotic plaque within the right carotid system within                    the                    neck, as described.  3. Vertebral arteries patent within the neck without                    significant                    stenosis.                    4. Aortic Atherosclerosis (ICD10-I70.0) and Emphysema                    (ICD10-J43.9). Performing Technologist: Oliver Hum RVT  Examination Guidelines: A complete evaluation includes B-mode imaging, spectral Doppler, color Doppler, and power Doppler as needed of all accessible portions of each vessel. Bilateral testing is considered an integral part of a complete examination. Limited examinations for reoccurring indications may be performed as noted.  Right Carotid Findings: +----------+--------+--------+--------+-----------------------+--------+             PSV cm/s EDV cm/s Stenosis Plaque Description      Comments  +----------+--------+--------+--------+-----------------------+--------+  CCA Prox   90       19                smooth and heterogenous           +----------+--------+--------+--------+-----------------------+--------+  CCA Distal 73       21                smooth and heterogenous           +----------+--------+--------+--------+-----------------------+--------+  ICA Prox   74       22                calcific                          +----------+--------+--------+--------+-----------------------+--------+  ICA Distal 111      38                                        tortuous   +----------+--------+--------+--------+-----------------------+--------+  ECA        97       12                                                  +----------+--------+--------+--------+-----------------------+--------+ +----------+--------+-------+--------+-------------------+             PSV cm/s EDV cms Describe Arm Pressure (mmHG)  +----------+--------+-------+--------+-------------------+  Subclavian 150                                            +----------+--------+-------+--------+-------------------+ +---------+--------+--+--------+--+---------+  Vertebral PSV cm/s 88 EDV cm/s 17 Antegrade  +---------+--------+--+--------+--+---------+  Left Carotid Findings: +----------+-------+-------+--------+---------------------------------+--------+             PSV     EDV     Stenosis Plaque Description                Comments              cm/s    cm/s                                                         +----------+-------+-------+--------+---------------------------------+--------+  CCA Prox   71      10               smooth and heterogenous                     +----------+-------+-------+--------+---------------------------------+--------+  CCA Distal 55      5                smooth and heterogenous                     +----------+-------+-------+--------+---------------------------------+--------+  ICA Prox   413     127     80-99%   calcific, irregular and                                                          heterogenous                                +----------+-------+-------+--------+---------------------------------+--------+  ICA Mid    157     33                                                           +----------+-------+-------+--------+---------------------------------+--------+  ICA Distal 63      19                                                 tortuous  +----------+-------+-------+--------+---------------------------------+--------+  ECA        166     18                                                            +----------+-------+-------+--------+---------------------------------+--------+ +----------+--------+--------+--------+-------------------+             PSV cm/s EDV cm/s Describe Arm Pressure (mmHG)  +----------+--------+--------+--------+-------------------+  Subclavian 134                                             +----------+--------+--------+--------+-------------------+ +---------+--------+--+--------+--+---------+  Vertebral PSV cm/s 73 EDV cm/s 17 Antegrade  +---------+--------+--+--------+--+---------+   Summary: Right Carotid: Velocities in the right ICA are consistent with a 1-39% stenosis. Left Carotid: Velocities in the left ICA are consistent with a 80-99% stenosis. Vertebrals: Bilateral vertebral arteries demonstrate antegrade flow. *See table(s) above for measurements and observations.  Electronically signed by Harold Barban MD on 09/27/2021 at 64:54:19 PM.    Final         Hosie Poisson M.D. [facility] 09/29/2021, 8:14 AM  Available via Epic secure chat 7am-7pm After 7 pm, please refer to night coverage provider listed on amion.

## 2021-09-29 NOTE — Progress Notes (Signed)
Speech Language Pathology Treatment: Dysphagia  Patient Details Name: Sheryl [name] MRN: [id number] DOB: 12-Jan-1951 Today's Date: 09/29/2021 Time: 3832-9191 SLP Time Calculation (min) (ACUTE ONLY): 13 min  Assessment / Plan / Recommendation Clinical Impression  Pt seen for ongoing dysphagia management. PT and OT set pt up with am tray earlier and noticed impulsivity and coughing with PO intake.  They removed tray from pt and informed SLP.  RN reports using verbal cues to slow down when pt was drinking liquids following medication administration, but no coughing events like what was observed on 2/9.  On SLP arrival pt was seating upright in chair and eating breakfast tray with sister present and seated next to her.  Pt exhibited good tolerance of POs and was feeding herself independently.  There was some R sided anterior loss. Sister reported cough x1 and did feel that her sister was eating quickly.  Pt continued to tolerate meal and SLP left room to get swallow precautions sign.  On return, pt began coughing with pudding.  There was expulsion of food/drink from R side with cough.  Cough was wet in character and prolonged with pt seemingly attempted to suppress cough at first, or cough began to build over time.  Given persistent clinical s/s with PO intake, recommend instrumental re-evaluation of swallowing.  Will plan MBSS later this date.  Pending results of MBSS, continue puree diet with nectar thick liquid with direct superversion.  Provide verbal cues to slow intake.  Provided education to sister regarding precautions.  She verbalized understanding.    HPI HPI: Pt is a 71 year old woman found down by her daughter in her home. 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 PMH: COPD, current smoker, HTN, HLD, prediabetes, CKD3, ADD, L TKA, R reverse TSA, depression, hearing loss.      SLP Plan   Continue with current plan of care;MBS      Recommendations for follow up therapy are one component of a multi-disciplinary discharge planning process, led by the attending physician.  Recommendations may be updated based on patient status, additional functional criteria and insurance authorization.    Recommendations  Diet recommendations: Dysphagia 1 (puree);Nectar-thick liquid Liquids provided via: Cup;Straw Medication Administration: Crushed with puree Supervision: Full supervision/cueing for compensatory strategies;Staff to assist with self feeding Compensations: Slow rate;Small sips/bites;Follow solids with liquid Postural Changes and/or Swallow Maneuvers: Seated upright 90 degrees                Oral Care Recommendations: Oral care BID Follow Up Recommendations: Acute inpatient rehab (3hours/day) Assistance recommended at discharge: Frequent or constant Supervision/Assistance SLP Visit Diagnosis: Dysphagia, oropharyngeal phase (R13.12) Plan: Continue with current plan of care;MBS           Celedonio Savage, [place], [place] Office: [phone]  09/29/2021, 10:47 AM

## 2021-09-29 NOTE — Assessment & Plan Note (Signed)
Improving mental status.  Started her on aspirin 325 mg dialy and plavix 75 mg daily.  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 MRI large left BG/CR infarct Carotid Doppler left ICA 80 to 99% stenosis Echocardiogram is unremarkable.  Therapy evaluations are recommending CIR.  Vascular surgery consulted for left ICA stenosis. Plan for TCAR.  Continue with plavix , aspirin and statin.  SLP evaluation recommending Dysphagia 1 diet.

## 2021-09-29 NOTE — Progress Notes (Signed)
Modified Barium Swallow Progress Note  Patient Details  Name: Sheryl [name] MRN: [id number] Date of Birth: [birth year]  Today's Date: 09/29/2021  Modified Barium Swallow completed.  Full report located under Chart Review in the Imaging Section.  Brief recommendations include the following:  Clinical Impression  Pt has a moderate oropharyngeal dysphagia with suspected esophageal component as observed during additional challenging in this repeat MBS. Oral deficits are similar to those noted on prior, with boluses spilling back into the pharynx as she works on clearing her oral cavity. Oral residue also remains after the swallow, as does vallecular residue due to reduced base of tongue retraction that is noticed more as consistencies become thicker. When pt self-regulates her pacing and bolus size, there is silent aspiration before/during the swallow with all liquid consistencies. It is not as consistent with honey thick liquids, but she also has a small amount of silent aspiration after the swallow due to the increase in pharyngeal residue. Attempted a chin tuck to better contain boluses in the oral cavity prior to swallowing, but she does not maintain her head in a lowered position despite cues. When liquids were given via spoon, penetration reaches the true vocal folds with nectar thick liquids but is more shallow with honey thick liquids and mostly ejected spontaneously (one instance of PAS 3). Also of note, pt appeared to have barium that filled her esophagus during this study, reaching as high as the cerivcal esophagus but seemingly reduced after liquid washes (MD not present to confirm). Suspect that this could also be contributing to the build up of coughing that pt starts to exhibit across the course of a meal.  Recommend continuing Dys 1 diet but with honey thick liquids by spoon only. Would be very mindful of upright positioning and alternating solids/liquids, keeping pt up for at least 30  minutes after meals to try to facilitate oropharyngeal and suspected esophageal issues.   Swallow Evaluation Recommendations   Recommended Consults: Consider esophageal assessment   SLP Diet Recommendations: Dysphagia 1 (Puree) solids;Honey thick liquids   Liquid Administration via: Spoon   Medication Administration: Crushed with puree   Supervision: Full supervision/cueing for compensatory strategies;Staff to assist with self feeding   Compensations: Slow rate;Small sips/bites;Minimize environmental distractions;Follow solids with liquid   Postural Changes: Remain semi-upright after after feeds/meals (Comment);Seated upright at 90 degrees   Oral Care Recommendations: Oral care BID   Other Recommendations: Order thickener from pharmacy;Prohibited food (jello, ice cream, thin soups);Remove water pitcher;Have oral suction available    Osie Bond., M.A. [place] Pager [phone] Office [phone]  09/29/2021,2:36 PM

## 2021-09-29 NOTE — Progress Notes (Signed)
°   09/29/21 1110  Clinical Encounter Type  Visited With Patient and family together  Visit Type Follow-up;Other (Comment) (Advanced Directive)  Referral From Nurse  Consult/Referral To None   Chaplain returned to meet with family member in addition to patient. We discussed the advanced directive and the steps to complete the process. Because patient was possibly moving to rehab today I advised the family member that the paperwork could be notarized outside the hospital and brought in at a time convenient for the family.   Danice Goltz Chaplain Resident  [facility] Resident  [phone]

## 2021-09-29 NOTE — Progress Notes (Signed)
Physical Therapy Treatment Patient Details Name: Sheryl [name] MRN: [id number] DOB: [birth year] Today's Date: 09/29/2021   History of Present Illness Pt is a 71 year old woman found down by her daughter in her home, admitted on 2/7. 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 PMH: COPD, current smoker, HTN, HLD, prediabetes, CKD3, ADD, L TKA, R reverse TSA, depression, hearling loss.    PT Comments    Pt very pleasant, and increasingly impulsive today during session, constantly trying to stand when therapy not ready to facilitate. Pt found to be incontinent of stool and requires mod A for rolling L and min A for rolling R. PT provided cuing for sequencing for power up into sitting and pt begins to attempt but quickly abandons technique. Once EoB pt immediately tries to stand needing cuing and assist to return to seated.  Pt requires modAx2 for power up to standing and maxAx2 to maintain. Impulsively, sits without notice. Pt better able to maintain upright in Stedy pulling with R UE. Continues impulsive with self care and feeding. Removed breakfast and notified SLP. Pt family reports not being able to provide support post AIR level rehab so PT changing recommendation to SNF level. PT will continue to follow acutely.    Recommendations for follow up therapy are one component of a multi-disciplinary discharge planning process, led by the attending physician.  Recommendations may be updated based on patient status, additional functional criteria and insurance authorization.  Follow Up Recommendations  Skilled nursing-short term rehab (<3 hours/day) (Family reports they are unable to provide level of assist required by AIR)     Assistance Recommended at Discharge Frequent or constant Supervision/Assistance  Patient can return home with the following Two people to help with walking and/or  transfers;Two people to help with bathing/dressing/bathroom;Assistance with cooking/housework;Assist for transportation;Help with stairs or ramp for entrance   Equipment Recommendations  None recommended by PT (defer to next venue)       Precautions / Restrictions Precautions Precautions: Fall Precaution Comments: R hemiparesis Restrictions Weight Bearing Restrictions: No Other Position/Activity Restrictions: R LE weakness with all mobility     Mobility  Bed Mobility Overal bed mobility: Needs Assistance Bed Mobility: Supine to Sit, Rolling Rolling: Mod assist, Min assist   Supine to sit: Mod assist     General bed mobility comments: mod A for rolling up onto L hip for pericare, min A for rolling R, maximal multimodal cuing for sequencing moving into upright, follows about 25% of commands    Transfers Overall transfer level: Needs assistance Equipment used: 2 person hand held assist Transfers: Sit to/from Stand Sit to Stand: +2 physical assistance, Mod assist           General transfer comment: 1st attempt to stand pt with decreased R sided activation, pt with hx of R knee TKA and decreased extension at baseline making it difficulty coming to fully upright, second attempt with Stedy much better, pt with lack of understanding about sitting on Stedy pads, frequently pushing up into standing while moving to sink for self care, increased R lateral lean which pt able to bring body to upright however lacks attention span to maintain    Ambulation/Gait               General Gait Details: unable to take steps      Modified Rankin (Stroke Patients Only) Modified Rankin (Stroke Patients  Only) Pre-Morbid Rankin Score: No symptoms Modified Rankin: Severe disability     Balance Overall balance assessment: Needs assistance   Sitting balance-Leahy Scale: Poor Sitting balance - Comments: min to min guard assist Postural control: Posterior lean, Right lateral  lean Standing balance support: Bilateral upper extremity supported, During functional activity Standing balance-Leahy Scale: Poor Standing balance comment: unable to maintain grip on Stedy with R UE but able to hold herself up with strong L UE                            Cognition Arousal/Alertness: Awake/alert, Lethargic Behavior During Therapy: Impulsive, Restless (very impulsive continually trying to stand despite cuing to wait, when breakfast set up begins eating with increased speed, meal removed and SLP notified) Overall Cognitive Status: Difficult to assess                                 General Comments: Pt unaware of being soiled, impusively moves despite multimodal cues for sequencing, very agreeable and indicates that she knows that she needs assist for movement, but unlikely this is true           General Comments General comments (skin integrity, edema, etc.): Pt has some movement of R UE and LE however is unable to attend to long enough for purposeful movement      Pertinent Vitals/Pain Pain Assessment Pain Assessment: No/denies pain Faces Pain Scale: No hurt     PT Goals (current goals can now be found in the care plan section) Acute Rehab PT Goals Patient Stated Goal: none stated PT Goal Formulation: With patient/family Time For Goal Achievement: 10/11/21 Potential to Achieve Goals: Good Progress towards PT goals: Progressing toward goals    Frequency    Min 4X/week      PT Plan Discharge plan needs to be updated    Co-evaluation PT/OT/SLP Co-Evaluation/Treatment: Yes Reason for Co-Treatment: For patient/therapist safety PT goals addressed during session: Mobility/safety with mobility;Balance OT goals addressed during session: ADL's and self-care      AM-PAC PT "6 Clicks" Mobility   Outcome Measure  Help needed turning from your back to your side while in a flat bed without using bedrails?: A Lot Help needed moving from  lying on your back to sitting on the side of a flat bed without using bedrails?: Total Help needed moving to and from a bed to a chair (including a wheelchair)?: Total Help needed standing up from a chair using your arms (e.g., wheelchair or bedside chair)?: Total Help needed to walk in hospital room?: Total Help needed climbing 3-5 steps with a railing? : Total 6 Click Score: 7    End of Session Equipment Utilized During Treatment: Gait belt;Other (comment) Charlaine Dalton) Activity Tolerance: Patient limited by fatigue;Treatment limited secondary to medical complications (Comment) Patient left: in bed;with call bell/phone within reach;with family/visitor present Nurse Communication: Mobility status;Other (comment) (R hemiparesis) PT Visit Diagnosis: Unsteadiness on feet (R26.81);Muscle weakness (generalized) (M62.81);Hemiplegia and hemiparesis Hemiplegia - Right/Left: Right Hemiplegia - dominant/non-dominant: Dominant Hemiplegia - caused by: Cerebral infarction     Time: 3557-3220 PT Time Calculation (min) (ACUTE ONLY): 42 min  Charges:  $Therapeutic Activity: 23-37 mins                     Tiran Sauseda B. Migdalia Dk PT, DPT Acute Rehabilitation Services Pager [phone] Office [phone]  [place] 09/29/2021, 10:18 AM

## 2021-09-29 NOTE — Progress Notes (Signed)
°   09/29/21 0915  Clinical Encounter Type  Visited With Patient  Visit Type Initial;Other (Comment) (Advanced Directive)  Referral From Nurse  Consult/Referral To None   Chaplain responded to a spiritual consult request for an advanced directive. I went through the document explaining the different sections and the options for each. I also explained the need to leave the last page blank until she was ready to have it notarized, At that point she should let her nurse know and we would be notified. The patient shared that a family member was coming in and they would work on it together. Patient inquired if this power of attorney was for everything. I explained that it was only for medical situations.   I also spent time listening as the patient shared her experiences during her stay. She was in good spirits and looking forward to her mealtime.    [place] Resident  [facility] [phone]

## 2021-09-29 NOTE — TOC Progression Note (Addendum)
Transition of Care [facility]) - Progression Note    Patient Details  Name: Sheryl [name] MRN: [id number] Date of Birth: [birth year]  [facility] CM/SW Contact  Pollie Friar, RN Phone Number: 09/29/2021, 12:58 PM  Clinical Narrative:    The daughter has selected [facility] for SNF rehab. Claiborne Billings with [facility] says they wont have a bed until Monday. Cm will have insurance started today for Monday admit. She will need a covid test Sunday for Monday.  TOC following.  1415: 2/11 - 2/14.  Reference ID: [id number]   Expected Discharge Plan: [place] Barriers to Discharge: Continued Medical Work up  Expected Discharge Plan and Services Expected Discharge Plan: [place] In-house Referral: Clinical Social Work Discharge Planning Services: CM Consult Post Acute Care Choice: [place] arrangements for the past 2 months: Single Family Home                                       Social Determinants of Health (SDOH) Interventions    Readmission Risk Interventions No flowsheet data found.

## 2021-09-29 NOTE — Assessment & Plan Note (Signed)
Stable

## 2021-09-30 DIAGNOSIS — I639 Cerebral infarction, unspecified: Secondary | ICD-10-CM

## 2021-09-30 DIAGNOSIS — E870 Hyperosmolality and hypernatremia: Secondary | ICD-10-CM

## 2021-09-30 LAB — BASIC METABOLIC PANEL
Anion gap: 10 (ref 5–15)
BUN: 26 mg/dL — ABNORMAL HIGH (ref 8–23)
CO2: 24 mmol/L (ref 22–32)
Calcium: 8.9 mg/dL (ref 8.9–10.3)
Chloride: 110 mmol/L (ref 98–111)
Creatinine, Ser: 1.19 mg/dL — ABNORMAL HIGH (ref 0.44–1.00)
GFR, Estimated: 49 mL/min — ABNORMAL LOW (ref >60)
Glucose, Bld: 151 mg/dL — ABNORMAL HIGH (ref 70–99)
Potassium: 3.7 mmol/L (ref 3.5–5.1)
Sodium: 144 mmol/L (ref 135–145)

## 2021-09-30 NOTE — Plan of Care (Signed)
°  Problem: Clinical Measurements: Goal: Ability to maintain clinical measurements within normal limits will improve Outcome: Progressing Goal: Will remain free from infection Outcome: Progressing Goal: Diagnostic test results will improve Outcome: Progressing   Problem: Safety: Goal: Ability to remain free from injury will improve Outcome: Progressing Problem: Ischemic Stroke/TIA Tissue Perfusion: Goal: Complications of ischemic stroke/TIA will be minimized Outcome: Progressing

## 2021-09-30 NOTE — Progress Notes (Signed)
[facility]                                                                               Sheryl [name], is a 71 y.o. female, DOB - [birth year], YSA:[id number] Admit date - 09/26/2021    Outpatient Primary MD for the patient is Burns, Claudina Lick, MD  LOS - 4  days    Brief summary   71 year old female with a history of hyperlipidemia, COPD, prediabetes, CKD stage III, chronic tobacco abuse, ADD presents to the ER today after being found down on the floor.  Last known normal was about 48 hours ago.  Family talk to her on Sunday night.  No contact with her on Monday.  Patient would not answer the phone until today.  EMS was called.  Patient was found on the ground.  Brought to the ER.  Patient is left-hand dominant.  She has a dense right upper extremity parapsoas.  CT scan demonstrated a large infarct of the left corona radiata, left caudate nucleus, left internal capsule and left external capsule.  Local mass effect with effacement of the left lateral ventricle. Neurology consulted and recommendations given.   Assessment & Plan    Assessment and Plan: * Acute ischemic left MCA stroke ([place])- (present on admission) Improving mental status.  Started her on aspirin 325 mg dialy and plavix 75 mg daily.  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 MRI large left BG/CR infarct Carotid Doppler left ICA 80 to 99% stenosis Echocardiogram is unremarkable.  Therapy evaluations are recommending CIR/ SNF. Family opted for SNF.  Vascular surgery consulted for left ICA stenosis. Plan for TCAR as per vascular surgery.  Continue with plavix , aspirin and statin.  SLP evaluation recommending Dysphagia 1 diet.   Hyperlipidemia- (present on admission)  LDL is 87 Continue with crestor 40 mg daily  Tobacco abuse- (present on admission) Add nicotine patch.  Rhabdomyolysis Gently  hydrated. CK Wnl.   GERD (gastroesophageal reflux disease)- (present on admission) Stable.   Stage 3a chronic kidney disease (CKD) (HCC) - baseline SCr 1.1-1.3- (present on admission) Creatinine appears to be at baseline, continue to monitor.   Prediabetes- (present on admission) Last A1c is 5.5%  COPD (chronic obstructive pulmonary disease) ([place])- (present on admission) No wheezing heard on exam.  Continue with bronchodilators as needed.   ADD (attention deficit disorder)- (present on admission) stable  HTN (hypertension)- (present on admission) permissive hypertension . Restart amlodipine 5 mg daily and cozaar 100 mg daily in the next 24 hours. .   Hypernatremia Possibly from poor oral intake and free water deficit. Encourage oral intake and recheck sodium levels today.     Estimated body mass index is 26.74 kg/m as calculated from the following:   Height as of this encounter: 5\' 1"  (1.549 m).   Weight as of this encounter: 64.2 kg.  Code Status: FULL CODE.  DVT Prophylaxis:  enoxaparin (LOVENOX) injection 30 mg Start: 09/27/21 1700 SCD's Start: 09/26/21 2337   Level of Care: Level of care: Progressive Family Communication: none.  Disposition Plan:  Remains inpatient appropriate: CIR placement.   Procedures:  MRI brain CTA HEAD AND NECK.  CAROTID DUPLEX  Consultants:   Neurology Vascular surgery.   Antimicrobials:   Anti-infectives (From admission, onward)    None        Medications  Scheduled Meds:  aspirin  300 mg Rectal Daily   Or   aspirin EC  325 mg Oral Daily   clopidogrel  75 mg Oral Daily   enoxaparin (LOVENOX) injection  30 mg Subcutaneous Q24H   mouth rinse  15 mL Mouth Rinse BID   nicotine  14 mg Transdermal Daily   rosuvastatin  40 mg Oral Daily   Continuous Infusions:   PRN Meds:.acetaminophen **OR** acetaminophen (TYLENOL) oral liquid 160 mg/5 mL **OR** acetaminophen, food thickener    Subjective:   Sheryl [name] was  seen and examined today. No new complaints.   Objective:   Vitals:   09/30/21 0354 09/30/21 0400 09/30/21 0804 09/30/21 1153  BP: 132/69  (!) 145/116 137/71  Pulse: 86 79 82 83  Resp: (!) 21 16 19 20   Temp:   98.1 F (36.7 C) 98.1 F (36.7 C)  TempSrc:  Oral Oral Oral  SpO2: 96% 96% 96% 96%  Weight:      Height:        Intake/Output Summary (Last 24 hours) at 09/30/2021 1530 Last data filed at 09/30/2021 1100 Gross per 24 hour  Intake 480 ml  Output 400 ml  Net 80 ml    Filed Weights   09/27/21 2146  Weight: 64.2 kg    General exam: Appears calm and comfortable  Respiratory system: Clear to auscultation. Respiratory effort normal. Cardiovascular system: S1 & S2 heard, RRR. No JVD,  No pedal edema. Gastrointestinal system: Abdomen is nondistended, soft and nontender. Normal bowel sounds heard. Central nervous system: alert and communicating. Right facial droop. Right upper extremity weakness persistent. Right lower extremity weakness improving.  Extremities: no pedal edema.  Skin: No rashes,.  Psychiatry:  Mood & affect appropriate.    Data Reviewed:  I have personally reviewed following labs and imaging studies   CBC Lab Results  Component Value Date   WBC 8.0 09/29/2021   RBC 3.42 (L) 09/29/2021   HGB 9.6 (L) 09/29/2021   HCT 31.2 (L) 09/29/2021   MCV 91.2 09/29/2021   MCH 28.1 09/29/2021   PLT 360 09/29/2021   MCHC 30.8 09/29/2021   RDW 15.6 (H) 09/29/2021   LYMPHSABS 2.8 09/29/2021   MONOABS 0.8 09/29/2021   EOSABS 0.1 09/29/2021   BASOSABS 0.0 16/02/3709     Last metabolic panel Lab Results  Component Value Date   NA 149 (H) 09/29/2021   K 3.7 09/29/2021   CL 114 (H) 09/29/2021   CO2 25 09/29/2021   BUN 30 (H) 09/29/2021   CREATININE 1.21 (H) 09/29/2021   GLUCOSE 107 (H) 09/29/2021   GFRNONAA 48 (L) 09/29/2021   GFRAA >60 04/02/2019   CALCIUM 9.2 09/29/2021   PROT 8.3 (H) 09/26/2021   ALBUMIN 3.8 09/26/2021   LABGLOB 3.2 02/18/2017    AGRATIO 1.3 02/18/2017   BILITOT 0.5 09/26/2021   ALKPHOS 142 (H) 09/26/2021   AST 140 (H) 09/26/2021   ALT 76 (H) 09/26/2021   ANIONGAP 10 09/29/2021    CBG (last 3)  No results for input(s): GLUCAP in the last 72 hours.    Coagulation Profile: Recent Labs  Lab 09/26/21 2239  INR 1.1      Radiology Studies: [facility] Swallowing [facility] Pathology  Result Date: 09/29/2021 Table formatting from the original result was not included. Objective Swallowing Evaluation: Type of Study: MBS-Modified Barium Swallow Study  Patient Details Name: Sheryl [name] MRN: [id number] Date of Birth: 08-Sep-1950 Today's Date: 09/29/2021 Time: SLP Start Time (ACUTE ONLY): 23 -SLP Stop Time (ACUTE ONLY): 7829 SLP Time Calculation (min) (ACUTE ONLY): 13 min Past Medical History: Past Medical History: Diagnosis Date  ADD (attention deficit disorder)   Allergy   Arthritis   CKD (chronic kidney disease), stage III (HCC)   COPD (chronic obstructive pulmonary disease) ([place]) 12/09/2014  Depression   Does use hearing aid   Dyspnea   on exertion  Hyperlipidemia   Hypertension   LGSIL (low grade squamous intraepithelial dysplasia)   Osteoporosis   Pre-diabetes  Past Surgical History: Past Surgical History: Procedure Laterality Date  JOINT REPLACEMENT    right knee  MULTIPLE TOOTH EXTRACTIONS    REPLACEMENT TOTAL KNEE  2013  right  REVERSE SHOULDER ARTHROPLASTY Right 03/30/2019  Procedure: REVERSE TOTAL SHOULDER ARTHROPLASTY;  Surgeon: Netta Cedars, MD;  Location: [facility];  Service: Orthopedics;  Laterality: Right;  interscalene block  TOTAL KNEE ARTHROPLASTY Left 12/27/2017  TOTAL KNEE ARTHROPLASTY Left 12/27/2017  Procedure: LEFT TOTAL KNEE ARTHROPLASTY;  Surgeon: Netta Cedars, MD;  Location: [facility];  Service: Orthopedics;  Laterality: Left; HPI: Pt is a 71 year old woman found down by her daughter in her home. MRI + for large infarct within L corona radiata, L caudate nucleus, L internal and external capsules with scattered small  infarcts in L MCA region. PMH: COPD, current smoker, HTN, HLD, prediabetes, CKD3, ADD, L TKA, R reverse TSA, depression, hearing loss.  Subjective: alert, cooperative  Recommendations for follow up therapy are one component of a multi-disciplinary discharge planning process, led by the attending physician.  Recommendations may be updated based on patient status, additional functional criteria and insurance authorization. Assessment / Plan / Recommendation Clinical Impressions 09/29/2021 Clinical Impression Pt has a moderate oropharyngeal dysphagia with suspected esophageal component as observed during additional challenging in this repeat MBS. Oral deficits are similar to those noted on prior, with boluses spilling back into the pharynx as she works on clearing her oral cavity. Oral residue also remains after the swallow, as does vallecular residue due to reduced base of tongue retraction that is noticed more as consistencies become thicker. When pt self-regulates her pacing and bolus size, there is silent aspiration before/during the swallow with all liquid consistencies. It is not as consistent with honey thick liquids, but she also has a small amount of silent aspiration after the swallow due to the increase in pharyngeal residue. Attempted a chin tuck to better contain boluses in the oral cavity prior to swallowing, but she does not maintain her head in a lowered position despite cues. When liquids were given via spoon, penetration reaches the true vocal folds with nectar thick liquids but is more shallow with honey thick liquids and mostly ejected spontaneously (one instance of PAS 3). Also of note, pt appeared to have barium that filled her esophagus during this study, reaching as high as the cerivcal esophagus but seemingly reduced after liquid washes (MD not present to confirm). Suspect that this could also be contributing to the build up of coughing that pt starts to exhibit across the course of a meal.   Recommend continuing Dys 1 diet but with honey thick liquids by spoon only. Would be very mindful of upright positioning and alternating solids/liquids, keeping pt up for at least 30  minutes after meals to try to facilitate oropharyngeal and suspected esophageal issues. SLP Visit Diagnosis Dysphagia, oropharyngeal phase (R13.12) Attention and concentration deficit following -- Frontal lobe and executive function deficit following -- Impact on safety and function Mild aspiration risk;Moderate aspiration risk   Treatment Recommendations 09/29/2021 Treatment Recommendations Therapy as outlined in treatment plan below   Prognosis 09/29/2021 Prognosis for Safe Diet Advancement Good Barriers to Reach Goals -- Barriers/Prognosis Comment -- Diet Recommendations 09/29/2021 SLP Diet Recommendations Dysphagia 1 (Puree) solids;Honey thick liquids Liquid Administration via Spoon Medication Administration Crushed with puree Compensations Slow rate;Small sips/bites;Minimize environmental distractions;Follow solids with liquid Postural Changes Remain semi-upright after after feeds/meals (Comment);Seated upright at 90 degrees   Other Recommendations 09/29/2021 Recommended Consults Consider esophageal assessment Oral Care Recommendations Oral care BID Other Recommendations Order thickener from pharmacy;Prohibited food (jello, ice cream, thin soups);Remove water pitcher;Have oral suction available Follow Up Recommendations Acute inpatient rehab (3hours/day) Assistance recommended at discharge Frequent or constant Supervision/Assistance Functional Status Assessment Patient has had a recent decline in their functional status and demonstrates the ability to make significant improvements in function in a reasonable and predictable amount of time. Frequency and Duration  09/29/2021 Speech Therapy Frequency (ACUTE ONLY) min 2x/week Treatment Duration 2 weeks   Oral Phase 09/29/2021 Oral Phase Impaired Oral - Pudding Teaspoon -- Oral - Pudding Cup  -- Oral - Honey Teaspoon Weak lingual manipulation;Reduced posterior propulsion;Lingual/palatal residue Oral - Honey Cup Reduced posterior propulsion;Decreased bolus cohesion;Weak lingual manipulation;Right anterior bolus loss;Lingual/palatal residue Oral - Nectar Teaspoon Weak lingual manipulation;Reduced posterior propulsion;Lingual/palatal residue Oral - Nectar Cup Reduced posterior propulsion;Decreased bolus cohesion;Weak lingual manipulation Oral - Nectar Straw Reduced posterior propulsion;Decreased bolus cohesion;Weak lingual manipulation Oral - Thin Teaspoon -- Oral - Thin Cup NT Oral - Thin Straw -- Oral - Puree Weak lingual manipulation;Reduced posterior propulsion;Lingual/palatal residue Oral - Mech Soft -- Oral - Regular -- Oral - Multi-Consistency -- Oral - Pill -- Oral Phase - Comment --  Pharyngeal Phase 09/29/2021 Pharyngeal Phase Impaired Pharyngeal- Pudding Teaspoon -- Pharyngeal -- Pharyngeal- Pudding Cup -- Pharyngeal -- Pharyngeal- Honey Teaspoon Penetration/Aspiration before swallow;Reduced tongue base retraction;Pharyngeal residue - valleculae Pharyngeal Material enters airway, remains ABOVE vocal cords and not ejected out;Material enters airway, remains ABOVE vocal cords then ejected out Pharyngeal- Honey Cup Reduced tongue base retraction;Pharyngeal residue - valleculae;Penetration/Aspiration before swallow;Penetration/Apiration after swallow Pharyngeal Material enters airway, passes BELOW cords without attempt by patient to eject out (silent aspiration) Pharyngeal- Nectar Teaspoon Reduced tongue base retraction;Pharyngeal residue - valleculae;Penetration/Aspiration before swallow Pharyngeal Material enters airway, CONTACTS cords and not ejected out Pharyngeal- Nectar Cup Penetration/Aspiration during swallow;Pharyngeal residue - valleculae;Reduced tongue base retraction Pharyngeal Material enters airway, passes BELOW cords without attempt by patient to eject out (silent aspiration)  Pharyngeal- Nectar Straw Penetration/Aspiration during swallow;Pharyngeal residue - valleculae;Compensatory strategies attempted (with notebox);Reduced tongue base retraction Pharyngeal Material enters airway, passes BELOW cords without attempt by patient to eject out (silent aspiration) Pharyngeal- Thin Teaspoon -- Pharyngeal -- Pharyngeal- Thin Cup NT Pharyngeal -- Pharyngeal- Thin Straw -- Pharyngeal -- Pharyngeal- Puree Reduced tongue base retraction;Pharyngeal residue - valleculae Pharyngeal -- Pharyngeal- Mechanical Soft -- Pharyngeal -- Pharyngeal- Regular -- Pharyngeal -- Pharyngeal- Multi-consistency -- Pharyngeal -- Pharyngeal- Pill -- Pharyngeal -- Pharyngeal Comment --  Cervical Esophageal Phase  09/27/2021 Cervical Esophageal Phase Impaired Pudding Teaspoon -- Pudding Cup -- Honey Teaspoon -- Honey Cup -- Nectar Teaspoon -- Nectar Cup -- Nectar Straw -- Thin Teaspoon NT Thin Cup Esophageal backflow into cervical esophagus Thin Straw NT Puree -- Mechanical Soft --  Regular -- Multi-consistency -- Pill -- Cervical Esophageal Comment mild amount of retrograde movement of bolus in cervical esophagus Osie Bond., M.A. [place] [facility] Pager [phone] Office [phone] 09/29/2021, 2:39 PM                          Hosie Poisson M.D. [facility] 09/30/2021, 3:30 PM  Available via Epic secure chat 7am-7pm After 7 pm, please refer to night coverage provider listed on amion.

## 2021-10-01 LAB — RESP PANEL BY RT-PCR (FLU A&B, COVID) ARPGX2
Influenza A by PCR: NEGATIVE
Influenza B by PCR: NEGATIVE
SARS Coronavirus 2 by RT PCR: NEGATIVE

## 2021-10-01 MED ORDER — ROPINIROLE HCL 0.25 MG PO TABS
0.2500 mg | ORAL_TABLET | Freq: Once | ORAL | Status: AC
  Administered 2021-10-01: 0.25 mg via ORAL
  Filled 2021-10-01: qty 1

## 2021-10-01 MED ORDER — AMLODIPINE BESYLATE 5 MG PO TABS
5.0000 mg | ORAL_TABLET | Freq: Every day | ORAL | Status: DC
  Administered 2021-10-01 – 2021-10-02 (×2): 5 mg via ORAL
  Filled 2021-10-01 (×2): qty 1

## 2021-10-01 NOTE — Progress Notes (Signed)
Chaplain responded to call from pt's daughter, [name], re. explanation of [place].  Chaplain met daughter and patient at bedside, offering ministry of presence hearing story of pt's journey and daughter's concerns.  Chaplain explained document and confirmed with daughter they are listed as primary contact person on pt's chart.  Chaplain available for return visit if requested.  [place]

## 2021-10-01 NOTE — Progress Notes (Signed)
[facility]                                                                               Sheryl [name], is a 71 y.o. female, DOB - 1951-03-25, QBH:[id number] Admit date - 09/26/2021    Outpatient Primary MD for the patient is Burns, Claudina Lick, MD  LOS - 5  days    Brief summary   71 year old female with a history of hyperlipidemia, COPD, prediabetes, CKD stage III, chronic tobacco abuse, ADD presents to the ER today after being found down on the floor.  Last known normal was about 48 hours ago.  Family talk to her on Sunday night.  No contact with her on Monday.  Patient would not answer the phone until today.  EMS was called.  Patient was found on the ground.  Brought to the ER.  Patient is left-hand dominant.  She has a dense right upper extremity parapsoas.  CT scan demonstrated a large infarct of the left corona radiata, left caudate nucleus, left internal capsule and left external capsule.  Local mass effect with effacement of the left lateral ventricle. Neurology consulted and recommendations given. Pt medically stable for discharge to SNF.    Assessment & Plan    Assessment and Plan: * Acute ischemic left MCA stroke ([place])- (present on admission) Improving mental status. Right sided hemiparesis, improving.  Started her on aspirin 325 mg dialy and plavix 75 mg daily.  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 MRI large left BG/CR infarct Carotid Doppler left ICA 80 to 99% stenosis Echocardiogram is unremarkable.  Therapy evaluations are recommending CIR/ SNF. Family opted for SNF.  Vascular surgery consulted for left ICA stenosis. Plan for TCAR as per vascular surgery.  Continue with plavix , aspirin and statin.  SLP evaluation recommending Dysphagia 1 diet.   Hyperlipidemia- (present on admission)  LDL is 87 Continue with crestor 40 mg daily  Tobacco  abuse- (present on admission) Add nicotine patch.  Rhabdomyolysis Gently hydrated. CK Wnl.   GERD (gastroesophageal reflux disease)- (present on admission) Stable.   Stage 3a chronic kidney disease (CKD) (HCC) - baseline SCr 1.1-1.3- (present on admission) Creatinine appears to be at baseline, continue to monitor.   Prediabetes- (present on admission) Last A1c is 5.5%  COPD (chronic obstructive pulmonary disease) ([place])- (present on admission) No wheezing heard on exam.  Continue with bronchodilators as needed.   ADD (attention deficit disorder)- (present on admission) stable  HTN (hypertension)- (present on admission) permissive hypertension . Restart amlodipine 5 mg daily and cozaar 100 mg daily in the next 24 hours. .   Hypernatremia Possibly from poor oral intake and free water deficit. Encourage oral intake and recheck sodium levels today.     Estimated body mass index is 26.74 kg/m as calculated from the following:   Height as of this encounter: 5\' 1"  (1.549 m).   Weight as of this encounter: 64.2 kg.  Code Status: FULL CODE.  DVT Prophylaxis:  enoxaparin (LOVENOX) injection 30 mg Start: 09/27/21 1700 SCD's Start: 09/26/21 2337   Level of Care: Level  of care: Med-Surg Family Communication: none.  Disposition Plan:     Remains inpatient appropriate: awaiting SNF placement.   Procedures:  MRI brain CTA HEAD AND NECK.  CAROTID DUPLEX  Consultants:   Neurology Vascular surgery.   Antimicrobials:   Anti-infectives (From admission, onward)    None        Medications  Scheduled Meds:  aspirin  300 mg Rectal Daily   Or   aspirin EC  325 mg Oral Daily   clopidogrel  75 mg Oral Daily   enoxaparin (LOVENOX) injection  30 mg Subcutaneous Q24H   mouth rinse  15 mL Mouth Rinse BID   nicotine  14 mg Transdermal Daily   rosuvastatin  40 mg Oral Daily   Continuous Infusions:   PRN Meds:.acetaminophen **OR** acetaminophen (TYLENOL) oral liquid 160  mg/5 mL **OR** acetaminophen, food thickener    Subjective:   Sheryl [name] was seen and examined today. No new complaints.   Objective:   Vitals:   09/30/21 2353 10/01/21 0410 10/01/21 0754 10/01/21 1155  BP: 135/61 (!) 128/92 134/68 (!) 146/65  Pulse: 81 87 79 91  Resp: 20 20 19 18   Temp: 98.1 F (36.7 C) 98 F (36.7 C) 98.2 F (36.8 C) 98.3 F (36.8 C)  TempSrc: Axillary Axillary Oral Oral  SpO2: 96% 100% 97% 97%  Weight:      Height:        Intake/Output Summary (Last 24 hours) at 10/01/2021 1514 Last data filed at 10/01/2021 2637 Gross per 24 hour  Intake 3600 ml  Output 800 ml  Net 2800 ml    Filed Weights   09/27/21 2146  Weight: 64.2 kg    General exam: Appears calm and comfortable  Respiratory system: Clear to auscultation. Respiratory effort normal. Cardiovascular system: S1 & S2 heard, RRR. No JVD, . No pedal edema. Gastrointestinal system: Abdomen is nondistended, soft and nontender. Normal bowel sounds heard. Central nervous system: Alert and  oriented to place and person. Improving speech and facial droop. Right sided weakness is improving.  Extremities: Symmetric 5 x 5 power. Skin: No rashes, lesions or ulcers Psychiatry:  Mood & affect appropriate.     Data Reviewed:  I have personally reviewed following labs and imaging studies   CBC Lab Results  Component Value Date   WBC 8.0 09/29/2021   RBC 3.42 (L) 09/29/2021   HGB 9.6 (L) 09/29/2021   HCT 31.2 (L) 09/29/2021   MCV 91.2 09/29/2021   MCH 28.1 09/29/2021   PLT 360 09/29/2021   MCHC 30.8 09/29/2021   RDW 15.6 (H) 09/29/2021   LYMPHSABS 2.8 09/29/2021   MONOABS 0.8 09/29/2021   EOSABS 0.1 09/29/2021   BASOSABS 0.0 85/88/5027     Last metabolic panel Lab Results  Component Value Date   NA 144 09/30/2021   K 3.7 09/30/2021   CL 110 09/30/2021   CO2 24 09/30/2021   BUN 26 (H) 09/30/2021   CREATININE 1.19 (H) 09/30/2021   GLUCOSE 151 (H) 09/30/2021   GFRNONAA 49 (L)  09/30/2021   GFRAA >60 04/02/2019   CALCIUM 8.9 09/30/2021   PROT 8.3 (H) 09/26/2021   ALBUMIN 3.8 09/26/2021   LABGLOB 3.2 02/18/2017   AGRATIO 1.3 02/18/2017   BILITOT 0.5 09/26/2021   ALKPHOS 142 (H) 09/26/2021   AST 140 (H) 09/26/2021   ALT 76 (H) 09/26/2021   ANIONGAP 10 09/30/2021    CBG (last 3)  No results for input(s): GLUCAP in the last 72 hours.  Coagulation Profile: Recent Labs  Lab 09/26/21 2239  INR 1.1      Radiology Studies: No results found.      Hosie Poisson M.D. [facility] 10/01/2021, 3:14 PM  Available via Epic secure chat 7am-7pm After 7 pm, please refer to night coverage provider listed on amion.

## 2021-10-02 DIAGNOSIS — J449 Chronic obstructive pulmonary disease, unspecified: Secondary | ICD-10-CM | POA: Diagnosis not present

## 2021-10-02 DIAGNOSIS — E11649 Type 2 diabetes mellitus with hypoglycemia without coma: Secondary | ICD-10-CM | POA: Diagnosis not present

## 2021-10-02 DIAGNOSIS — J439 Emphysema, unspecified: Secondary | ICD-10-CM | POA: Diagnosis not present

## 2021-10-02 DIAGNOSIS — G8191 Hemiplegia, unspecified affecting right dominant side: Secondary | ICD-10-CM | POA: Diagnosis not present

## 2021-10-02 DIAGNOSIS — R221 Localized swelling, mass and lump, neck: Secondary | ICD-10-CM | POA: Diagnosis not present

## 2021-10-02 DIAGNOSIS — G2581 Restless legs syndrome: Secondary | ICD-10-CM | POA: Diagnosis not present

## 2021-10-02 DIAGNOSIS — K5730 Diverticulosis of large intestine without perforation or abscess without bleeding: Secondary | ICD-10-CM | POA: Diagnosis not present

## 2021-10-02 DIAGNOSIS — N1831 Chronic kidney disease, stage 3a: Secondary | ICD-10-CM | POA: Diagnosis present

## 2021-10-02 DIAGNOSIS — I69351 Hemiplegia and hemiparesis following cerebral infarction affecting right dominant side: Secondary | ICD-10-CM | POA: Diagnosis not present

## 2021-10-02 DIAGNOSIS — R4182 Altered mental status, unspecified: Secondary | ICD-10-CM | POA: Diagnosis not present

## 2021-10-02 DIAGNOSIS — E782 Mixed hyperlipidemia: Secondary | ICD-10-CM | POA: Diagnosis not present

## 2021-10-02 DIAGNOSIS — J9621 Acute and chronic respiratory failure with hypoxia: Secondary | ICD-10-CM | POA: Diagnosis present

## 2021-10-02 DIAGNOSIS — R6521 Severe sepsis with septic shock: Secondary | ICD-10-CM | POA: Diagnosis present

## 2021-10-02 DIAGNOSIS — Z66 Do not resuscitate: Secondary | ICD-10-CM | POA: Diagnosis not present

## 2021-10-02 DIAGNOSIS — I69352 Hemiplegia and hemiparesis following cerebral infarction affecting left dominant side: Secondary | ICD-10-CM | POA: Diagnosis not present

## 2021-10-02 DIAGNOSIS — J9601 Acute respiratory failure with hypoxia: Secondary | ICD-10-CM | POA: Diagnosis present

## 2021-10-02 DIAGNOSIS — K760 Fatty (change of) liver, not elsewhere classified: Secondary | ICD-10-CM | POA: Diagnosis present

## 2021-10-02 DIAGNOSIS — R918 Other nonspecific abnormal finding of lung field: Secondary | ICD-10-CM | POA: Diagnosis not present

## 2021-10-02 DIAGNOSIS — J392 Other diseases of pharynx: Secondary | ICD-10-CM | POA: Diagnosis not present

## 2021-10-02 DIAGNOSIS — I10 Essential (primary) hypertension: Secondary | ICD-10-CM | POA: Diagnosis not present

## 2021-10-02 DIAGNOSIS — J9600 Acute respiratory failure, unspecified whether with hypoxia or hypercapnia: Secondary | ICD-10-CM | POA: Diagnosis not present

## 2021-10-02 DIAGNOSIS — N179 Acute kidney failure, unspecified: Secondary | ICD-10-CM | POA: Diagnosis present

## 2021-10-02 DIAGNOSIS — K5939 Other megacolon: Secondary | ICD-10-CM | POA: Diagnosis not present

## 2021-10-02 DIAGNOSIS — R4701 Aphasia: Secondary | ICD-10-CM | POA: Diagnosis present

## 2021-10-02 DIAGNOSIS — I69391 Dysphagia following cerebral infarction: Secondary | ICD-10-CM | POA: Diagnosis not present

## 2021-10-02 DIAGNOSIS — I63512 Cerebral infarction due to unspecified occlusion or stenosis of left middle cerebral artery: Secondary | ICD-10-CM | POA: Diagnosis present

## 2021-10-02 DIAGNOSIS — K6389 Other specified diseases of intestine: Secondary | ICD-10-CM | POA: Diagnosis not present

## 2021-10-02 DIAGNOSIS — E874 Mixed disorder of acid-base balance: Secondary | ICD-10-CM | POA: Diagnosis present

## 2021-10-02 DIAGNOSIS — J441 Chronic obstructive pulmonary disease with (acute) exacerbation: Secondary | ICD-10-CM | POA: Diagnosis present

## 2021-10-02 DIAGNOSIS — T796XXA Traumatic ischemia of muscle, initial encounter: Secondary | ICD-10-CM | POA: Diagnosis not present

## 2021-10-02 DIAGNOSIS — J189 Pneumonia, unspecified organism: Secondary | ICD-10-CM | POA: Diagnosis not present

## 2021-10-02 DIAGNOSIS — G459 Transient cerebral ischemic attack, unspecified: Secondary | ICD-10-CM | POA: Diagnosis not present

## 2021-10-02 DIAGNOSIS — R0602 Shortness of breath: Secondary | ICD-10-CM | POA: Diagnosis not present

## 2021-10-02 DIAGNOSIS — R0989 Other specified symptoms and signs involving the circulatory and respiratory systems: Secondary | ICD-10-CM | POA: Diagnosis not present

## 2021-10-02 DIAGNOSIS — F329 Major depressive disorder, single episode, unspecified: Secondary | ICD-10-CM | POA: Diagnosis present

## 2021-10-02 DIAGNOSIS — D649 Anemia, unspecified: Secondary | ICD-10-CM | POA: Diagnosis present

## 2021-10-02 DIAGNOSIS — Z7401 Bed confinement status: Secondary | ICD-10-CM | POA: Diagnosis not present

## 2021-10-02 DIAGNOSIS — E870 Hyperosmolality and hypernatremia: Secondary | ICD-10-CM | POA: Diagnosis not present

## 2021-10-02 DIAGNOSIS — J90 Pleural effusion, not elsewhere classified: Secondary | ICD-10-CM | POA: Diagnosis not present

## 2021-10-02 DIAGNOSIS — D72829 Elevated white blood cell count, unspecified: Secondary | ICD-10-CM | POA: Diagnosis not present

## 2021-10-02 DIAGNOSIS — A419 Sepsis, unspecified organism: Secondary | ICD-10-CM | POA: Diagnosis present

## 2021-10-02 DIAGNOSIS — R6520 Severe sepsis without septic shock: Secondary | ICD-10-CM | POA: Diagnosis not present

## 2021-10-02 DIAGNOSIS — E785 Hyperlipidemia, unspecified: Secondary | ICD-10-CM | POA: Diagnosis not present

## 2021-10-02 DIAGNOSIS — I6522 Occlusion and stenosis of left carotid artery: Secondary | ICD-10-CM | POA: Diagnosis not present

## 2021-10-02 DIAGNOSIS — R062 Wheezing: Secondary | ICD-10-CM | POA: Diagnosis not present

## 2021-10-02 DIAGNOSIS — J9622 Acute and chronic respiratory failure with hypercapnia: Secondary | ICD-10-CM | POA: Diagnosis present

## 2021-10-02 DIAGNOSIS — I952 Hypotension due to drugs: Secondary | ICD-10-CM | POA: Diagnosis not present

## 2021-10-02 DIAGNOSIS — Z7189 Other specified counseling: Secondary | ICD-10-CM | POA: Diagnosis not present

## 2021-10-02 DIAGNOSIS — D62 Acute posthemorrhagic anemia: Secondary | ICD-10-CM | POA: Diagnosis not present

## 2021-10-02 DIAGNOSIS — Z515 Encounter for palliative care: Secondary | ICD-10-CM | POA: Diagnosis not present

## 2021-10-02 DIAGNOSIS — R051 Acute cough: Secondary | ICD-10-CM | POA: Diagnosis not present

## 2021-10-02 DIAGNOSIS — R000 Tachycardia, unspecified: Secondary | ICD-10-CM | POA: Diagnosis not present

## 2021-10-02 DIAGNOSIS — Z4682 Encounter for fitting and adjustment of non-vascular catheter: Secondary | ICD-10-CM | POA: Diagnosis not present

## 2021-10-02 DIAGNOSIS — Z720 Tobacco use: Secondary | ICD-10-CM | POA: Diagnosis not present

## 2021-10-02 DIAGNOSIS — Z20822 Contact with and (suspected) exposure to covid-19: Secondary | ICD-10-CM | POA: Diagnosis present

## 2021-10-02 DIAGNOSIS — I639 Cerebral infarction, unspecified: Secondary | ICD-10-CM | POA: Diagnosis not present

## 2021-10-02 DIAGNOSIS — F29 Unspecified psychosis not due to a substance or known physiological condition: Secondary | ICD-10-CM | POA: Diagnosis not present

## 2021-10-02 DIAGNOSIS — G9341 Metabolic encephalopathy: Secondary | ICD-10-CM | POA: Diagnosis present

## 2021-10-02 DIAGNOSIS — J811 Chronic pulmonary edema: Secondary | ICD-10-CM | POA: Diagnosis not present

## 2021-10-02 DIAGNOSIS — J690 Pneumonitis due to inhalation of food and vomit: Secondary | ICD-10-CM | POA: Diagnosis not present

## 2021-10-02 DIAGNOSIS — R414 Neurologic neglect syndrome: Secondary | ICD-10-CM | POA: Diagnosis present

## 2021-10-02 DIAGNOSIS — I6529 Occlusion and stenosis of unspecified carotid artery: Secondary | ICD-10-CM | POA: Diagnosis not present

## 2021-10-02 DIAGNOSIS — I129 Hypertensive chronic kidney disease with stage 1 through stage 4 chronic kidney disease, or unspecified chronic kidney disease: Secondary | ICD-10-CM | POA: Diagnosis present

## 2021-10-02 DIAGNOSIS — I700 Atherosclerosis of aorta: Secondary | ICD-10-CM | POA: Diagnosis not present

## 2021-10-02 DIAGNOSIS — I612 Nontraumatic intracerebral hemorrhage in hemisphere, unspecified: Secondary | ICD-10-CM | POA: Diagnosis present

## 2021-10-02 DIAGNOSIS — R0902 Hypoxemia: Secondary | ICD-10-CM | POA: Diagnosis not present

## 2021-10-02 MED ORDER — CLOPIDOGREL BISULFATE 75 MG PO TABS: 75.0000 mg | ORAL_TABLET | Freq: Every day | ORAL | 1 refills | Status: DC

## 2021-10-02 MED ORDER — NICOTINE 14 MG/24HR TD PT24: 14.0000 mg | MEDICATED_PATCH | Freq: Every day | TRANSDERMAL | 0 refills | Status: DC

## 2021-10-02 MED ORDER — FOOD THICKENER (SIMPLYTHICK): 10.0000 | ORAL | Status: DC | PRN

## 2021-10-02 MED ORDER — ASPIRIN 325 MG PO TBEC: 325.0000 mg | DELAYED_RELEASE_TABLET | Freq: Every day | ORAL | 0 refills | Status: DC

## 2021-10-02 NOTE — Progress Notes (Signed)
°   10/02/21 1300  Clinical Encounter Type  Visited With Patient;Family  Visit Type Follow-up  Referral From Nurse  Consult/Referral To None   Chaplain supported patient as she completed the final step of the advanced directive with the notarizing then copying and uploading to system being the final steps. Provided the patient with the original and one copy.   [place] Resident  [facility]  [phone]

## 2021-10-02 NOTE — TOC Transition Note (Signed)
Transition of Care [facility]) - CM/SW Discharge Note   Patient Details  Name: Sheryl [name] MRN: [id number] Date of Birth: February 26, 1951  [facility] CM/SW Contact:  Pollie Friar, RN Phone Number: 10/02/2021, 11:14 AM   Clinical Narrative:    Patient is discharging to [facility] SNF today. Discharge packet is at the desk. Pt will transport via [facility]. Bedside RN updated.   Number for report: [phone]   Final next level of care: Skilled Nursing Facility Barriers to Discharge: No Barriers Identified   Patient Goals and CMS Choice   CMS Medicare.gov Compare Post Acute Care list provided to:: Patient Choice offered to / list presented to : Patient, Adult Children  Discharge Placement              Patient chooses bed at: [facility] Patient to be transferred to facility by: [place] Name of family member notified: [name] Patient and family notified of of transfer: 10/02/21  Discharge Plan and Services In-house Referral: Clinical Social Work Discharge Planning Services: CM Consult Post Acute Care Choice: [facility]                               Social Determinants of Health (SDOH) Interventions     Readmission Risk Interventions No flowsheet data found.

## 2021-10-02 NOTE — Discharge Summary (Signed)
Physician Discharge Summary   Patient: Sheryl [name] MRN: [id number] DOB: 1951-03-03  Admit date:     09/26/2021  Discharge date: 10/02/21  Discharge Physician: Hosie Poisson   PCP: Binnie Rail, MD   Recommendations at discharge:  Please follow up with PCP in one week Please follow up with vascular surgery as scheduled in 2 weeks.  Please follow up with Neurology as recommended.   Discharge Diagnoses: Principal Problem:   Acute ischemic left MCA stroke (HCC) Active Problems:   Hyperlipidemia   Tobacco abuse   HTN (hypertension)   ADD (attention deficit disorder)   COPD (chronic obstructive pulmonary disease) (HCC)   Prediabetes   Stage 3a chronic kidney disease (CKD) (HCC) - baseline SCr 1.1-1.3   GERD (gastroesophageal reflux disease)   Rhabdomyolysis   Hypernatremia  Resolved Problems:   * No resolved hospital problems. *  71 year old female with a history of hyperlipidemia, COPD, prediabetes, CKD stage III, chronic tobacco abuse, ADD presents to the ER today after being found down on the floor.  Last known normal was about 48 hours ago.  Family talk to her on Sunday night.  No contact with her on Monday.  Patient would not answer the phone until today.  EMS was called.  Patient was found on the ground.  Brought to the ER.  Patient is left-hand dominant.  She has a dense right upper extremity parapsoas.  CT scan demonstrated a large infarct of the left corona radiata, left caudate nucleus, left internal capsule and left external capsule.  Local mass effect with effacement of the left lateral ventricle. Neurology consulted and recommendations given. Pt medically stable for discharge to SNF.     Hospital Course:  * Acute ischemic left MCA stroke [facility])- (present on admission) Improving mental status. Right sided hemiparesis, improving.  Started her on aspirin 325 mg dialy and plavix 75 mg daily.  CTA head & neck left ICA 80% stenosis, left M1 occlusion with M2 branch  reconstitution with possible inferior left M2 stenosis/short segment occlusion. MRI large left BG/CR infarct Carotid Doppler left ICA 80 to 99% stenosis Echocardiogram is unremarkable.  Therapy evaluations are recommending CIR/ SNF. Family opted for SNF.  Vascular surgery consulted for left ICA stenosis. Plan for TCAR as per vascular surgery.  Continue with plavix , aspirin and statin.  SLP evaluation recommending Dysphagia 1 diet.    Hyperlipidemia- (present on admission)   LDL is 87 Continue with crestor 40 mg daily   Tobacco abuse- (present on admission) Add nicotine patch.   Rhabdomyolysis Gently hydrated. CK Wnl.     GERD (gastroesophageal reflux disease)- (present on admission) Stable.    Stage 3a chronic kidney disease (CKD) (HCC) - baseline SCr 1.1-1.3- (present on admission) Creatinine appears to be at baseline, continue to monitor.    Prediabetes- (present on admission) Last A1c is 5.5%   COPD (chronic obstructive pulmonary disease) ([place])- (present on admission) No wheezing heard on exam.  Continue with bronchodilators as needed.    ADD (attention deficit disorder)- (present on admission) stable   HTN (hypertension)- (present on admission) permissive hypertension . Restart amlodipine 5 mg daily and cozaar 100 mg daily in the next 24 hours. .    Hypernatremia Possibly from poor oral intake and free water deficit. Encourage oral intake and recheck sodium levels today.    Estimated body mass index is 26.74 kg/m as calculated from the following:   Height as of this encounter: 5\' 1"  (1.549 m).   Weight as  of this encounter: 64.2 kg. Assessment and Plan: Consultants: neurology Procedures performed: echo  Disposition: Skilled nursing facility Diet recommendation:  Discharge Diet Orders (From admission, onward)     Start     Ordered   10/02/21 0000  Diet - low sodium heart healthy        10/02/21 0934           Dysphagia type 1 Honey  Liquid  DISCHARGE MEDICATION: Allergies as of 10/02/2021       Reactions   Macrobid [nitrofurantoin Monohyd Macro] Nausea And Vomiting   GI upset        Medication List     STOP taking these medications    hydrochlorothiazide 12.5 MG tablet Commonly known as: HYDRODIURIL       TAKE these medications    albuterol 108 (90 Base) MCG/ACT inhaler Commonly known as: VENTOLIN HFA Inhale 1-2 puffs into the lungs every 4 (four) hours as needed for wheezing or shortness of breath.   amLODipine 5 MG tablet Commonly known as: NORVASC TAKE 1 TABLET BY MOUTH EVERY DAY   aspirin 325 MG EC tablet Take 1 tablet (325 mg total) by mouth daily.   atomoxetine 100 MG capsule Commonly known as: STRATTERA TAKE 1 CAPSULE BY MOUTH EVERY DAY What changed: how much to take   SunGard 160-9-4.8 MCG/ACT Aero Generic drug: Budeson-Glycopyrrol-Formoterol Inhale 2 puffs into the lungs 2 (two) times daily.   clopidogrel 75 MG tablet Commonly known as: PLAVIX Take 1 tablet (75 mg total) by mouth daily.   cyclobenzaprine 10 MG tablet Commonly known as: FLEXERIL Take 0.5-1 tablets (5-10 mg total) by mouth 3 (three) times daily as needed for muscle spasms.   FLUoxetine 20 MG capsule Commonly known as: PROZAC TAKE 1 CAPSULE (20 MG TOTAL) BY MOUTH DAILY IN ADDITION TO 40 MG FOR A TOTAL OF 60 MG. What changed:  how much to take how to take this when to take this additional instructions   FLUoxetine 40 MG capsule Commonly known as: PROZAC TAKE 1 CAPSULE BY MOUTH DAILY. IN ADDITION TO 20 MG FOR TOTAL OF 60 MG DAILY What changed: See the new instructions.   fluticasone 50 MCG/ACT nasal spray Commonly known as: FLONASE Place 1 spray into both nostrils daily as needed for allergies.   food thickener Gel Commonly known as: SIMPLYTHICK (NECTAR/LEVEL 2/MILDLY THICK) Take 10 packets by mouth as needed.   losartan 100 MG tablet Commonly known as: COZAAR TAKE 1 TABLET BY MOUTH EVERY  DAY   meclizine 25 MG tablet Commonly known as: ANTIVERT Take 1 tablet (25 mg total) by mouth 3 (three) times daily as needed for dizziness.   nicotine 14 mg/24hr patch Commonly known as: NICODERM CQ - dosed in mg/24 hours Place 1 patch (14 mg total) onto the skin daily.   omeprazole 20 MG capsule Commonly known as: PRILOSEC TAKE 1 CAPSULE BY MOUTH EVERY DAY What changed: how much to take   rOPINIRole 0.25 MG tablet Commonly known as: REQUIP TAKE 1 TABLET BY MOUTH AT BEDTIME.   rosuvastatin 20 MG tablet Commonly known as: Crestor Take 1 tablet (20 mg total) by mouth daily.   traZODone 50 MG tablet Commonly known as: DESYREL TAKE 1 TABLET BY MOUTH EVERYDAY AT BEDTIME What changed: See the new instructions.        Contact information for follow-up providers     [facility]. Schedule an appointment as soon as possible for a visit in 1 month(s).   Specialty:  Neurology Why: stroke clinic Contact information: [place] [phone]             Contact information for after-discharge care     Destination     [facility] .   Service: Skilled Nursing Contact information: [address] [phone]                     Discharge Exam: Danley Danker Weights   09/27/21 2146  Weight: 64.2 kg   Vitals with BMI 10/02/2021 10/02/2021 10/01/2021  Height - - -  Weight - - -  BMI - - -  Systolic [phone]  Diastolic 73 74 93  Pulse 95 80 91   General exam: Appears calm and comfortable  Respiratory system: Clear to auscultation. Respiratory effort normal. Cardiovascular system: S1 & S2 heard, RRR. No JVD, No pedal edema. Gastrointestinal system: Abdomen is nondistended, soft and nontender. Normal bowel sounds heard. Central nervous system: Alert and oriented right sided weakness improving.  Extremities: Symmetric 5 x 5 power. Skin: No  rashes, lesions or ulcers Psychiatry: Judgement and insight appear normal. Mood & affect appropriate.    Condition at discharge: fair  The results of significant diagnostics from this hospitalization (including imaging, microbiology, ancillary and laboratory) are listed below for reference.   Imaging Studies: CT ANGIO HEAD NECK W WO CM  Result Date: 09/26/2021 CLINICAL DATA:  Provided history: Neuro deficit, acute, stroke suspected. Additional history provided: Patient from home after daughter found patient on ground, last known well Sunday night, stroke-like symptoms, right arm weakness, slurred speech. EXAM: CT ANGIOGRAPHY HEAD AND NECK TECHNIQUE: Multidetector CT imaging of the head and neck was performed using the standard protocol during bolus administration of intravenous contrast. Multiplanar CT image reconstructions and MIPs were obtained to evaluate the vascular anatomy. Carotid stenosis measurements (when applicable) are obtained utilizing NASCET criteria, using the distal internal carotid diameter as the denominator. RADIATION DOSE REDUCTION: This exam was performed according to the departmental dose-optimization program which includes automated exposure control, adjustment of the mA and/or kV according to patient size and/or use of iterative reconstruction technique. CONTRAST:  5mL OMNIPAQUE IOHEXOL 350 MG/ML SOLN COMPARISON:  None. FINDINGS: CT HEAD FINDINGS Brain: Cerebral volume appears normal for age. Acute or early subacute infarct measuring 4.8 x 2.7 x 3.7 cm within the left corona radiata, left caudate and lentiform nuclei, left internal capsule and left external capsule. Mild local mass effect with partial effacement of the left lateral ventricle. No midline shift. No evidence of hemorrhagic conversion. No demarcated cortical infarct. No extra-axial fluid collection. No evidence of an intracranial mass. No midline shift. Vascular: No hyperdense vessel.  Atherosclerotic calcifications.  Skull: Normal. Negative for fracture or focal lesion. Sinuses: Visualized orbits show no acute finding. Orbits: No orbital mass or acute orbital finding. Review of the MIP images confirms the above findings CTA NECK FINDINGS Aortic arch: Standard aortic branching. Atherosclerotic plaque within the visualized aortic arch and proximal major branch vessels of the neck. Motion artifact limits evaluation of the right subclavian artery. Streak and beam hardening artifact arising from a dense left-sided contrast bolus limits evaluation of the left subclavian artery. Within this limitation, no definite hemodynamically significant stenosis is identified within the innominate or proximal subclavian arteries. Right carotid system: CCA and ICA patent within the neck without significant stenosis (50% or greater). Mild atherosclerotic plaque about the carotid bifurcation and within the proximal ICA.  Left carotid system: CCA and ICA patent within the neck. Soft calcified plaque about the carotid bifurcation and within the proximal ICA. Stenosis at the origin of the left ICA which is difficult to accurately quantify due to blooming artifact from calcified plaque, but appears greater than 80%. Distal to this, the left ICA is asymmetrically diminutive within the neck as compared to the right ICA. Vertebral arteries: Vertebral arteries patent within the neck without significant stenosis. Skeleton: Please refer to the separately reported CT of the cervical spine for cervical spine findings. There, no acute bony abnormality or aggressive osseous lesion is identified. Other neck: No neck mass or cervical lymphadenopathy. Subcentimeter calcified nodule within the left thyroid lobe not meeting consensus criteria for ultrasound follow-up based on size. Upper chest: No consolidation within the imaged lung apices. Centrilobular emphysema. Review of the MIP images confirms the above findings CTA HEAD FINDINGS Anterior circulation: The  intracranial internal carotid arteries are patent. Calcified plaque within both vessels with no more than mild stenosis. The left M1 middle cerebral artery is occluded at its origin. There is some reconstitution of enhancement within the M2 and more distal left MCA vessels. Over, there is a severe stenosis versus focal occlusion within an inferior division proximal M2 left MCA vessel (series 14, image 25). The M1 right MCA is patent. No right M2 proximal branch occlusion or high-grade proximal stenosis. The anterior cerebral arteries are patent. No intracranial aneurysm is identified. Posterior circulation: The intracranial vertebral arteries are patent. The basilar artery is patent. The posterior cerebral arteries are patent. Predominantly fetal origin of the right posterior cerebral artery. A sizable left posterior communicating artery is present. Venous sinuses: Within the limitations of contrast timing, no convincing thrombus. Anatomic variants: As described. Review of the MIP images confirms the above findings These results were called by telephone at the time of interpretation on 09/26/2021 at 9:34 pm to provider Davonna Belling , who verbally acknowledged these results. CT head and CTA head impression #1 called by telephone at the time of interpretation on 09/26/2021 at 9:55 pm to provider Davonna Belling , who verbally acknowledged these results. IMPRESSION: CT head: Acute or early subacute infarct measuring 4.8 x 2.7 x 3.7 cm within the left corona radiata, left caudate and lentiform nuclei, left internal capsule and left external capsule. Local mass effect with partial effacement of the left lateral ventricle. No evidence of hemorrhagic conversion. CTA neck: 1. The left common and internal carotid arteries are patent within the neck. Prominent atherosclerotic plaque about the left carotid bifurcation and within the proximal ICA. Exact quantification of stenosis at the origin of the left ICA is difficult due  to blooming artifact from calcified plaque at this site, but appears greater than 80%. Consider correlation with a carotid artery duplex ultrasound. 2. The right common and internal carotid arteries are patent within the neck without hemodynamically significant stenosis. Mild atherosclerotic plaque within the right carotid system within the neck, as described. 3. Vertebral arteries patent within the neck without significant stenosis. 4. Aortic Atherosclerosis (ICD10-I70.0) and Emphysema (ICD10-J43.9). CTA head: 1. 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 There is some reconstitution of enhancement within the M2 and more distal left MCA vessels. However, there is a superimposed severe stenosis versus focal occlusion within an inferior division left M2 MCA vessel. 2. Calcified plaque within the intracranial internal carotid arteries with no more than mild stenosis. Electronically Signed   By: Kellie Simmering D.O.   On: 09/26/2021  21:56   DG Abd 1 View  Result Date: 09/27/2021 CLINICAL DATA:  MRI screening EXAM: ABDOMEN - 1 VIEW COMPARISON:  None. FINDINGS: Unremarkable bowel gas pattern. There is residual contrast within the bladder. No radiopaque foreign body. IMPRESSION: No radiopaque foreign body. Electronically Signed   By: Macy Mis M.D.   On: 09/27/2021 11:31   MR BRAIN WO CONTRAST  Result Date: 09/27/2021 CLINICAL DATA:  Stroke, follow-up. Additional history provided: Provided history: EXAM: MRI HEAD WITHOUT CONTRAST TECHNIQUE: Multiplanar, multiecho pulse sequences of the brain and surrounding structures were obtained without intravenous contrast. COMPARISON:  CT angiogram head/neck 09/26/2021. FINDINGS: Brain: Mild intermittent motion degradation. Redemonstrated acute/early subacute infarct within the left corona radiata, left caudate and lentiform nuclei, left internal capsule and left external capsule. This infarct has slightly increased in size since the CT angiogram head/neck  of 09/26/2021, now measuring 5.5 x 2.7 x 3.7 cm (previously 4.8 x 2.7 x 3.7 cm). As before, there is local mass effect with partial effacement of the left lateral ventricle. No midline shift or evidence of hemorrhagic conversion. Additional small acute/early subacute infarct within the anteromedial left temporal lobe (in retrospect likely present on the prior CTA head of 09/26/2021). There are several additional small acute early/subacute cortically based infarcts within the left temporal, parietal and frontal lobes (left MCA vascular territory). No evidence of an intracranial mass. No extra-axial fluid collection. 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 A severe stenosis versus focal occlusion was also noted within an inferior division proximal M2 left MCA vessel on this prior exam. Skull and upper cervical spine: No focal suspicious marrow lesion. Sinuses/Orbits: Visualized orbits show no acute finding. Bilateral ocular lens replacements. IMPRESSION: 5.5 x 2.7 x 3.7 cm acute/early subacute infarct within the left corona radiata, basal ganglia, internal capsule and external capsule. This infarct has slightly increased in size since the CTA head/neck of 09/26/2021. As before, there is local mass effect with partial effacement of the left lateral ventricle. No midline shift or evidence of hemorrhagic conversion. Multiple additional small acute/early subacute cortically-based infarcts within the left frontal, parietal and temporal lobes (left MCA vascular territory). Electronically Signed   By: Kellie Simmering D.O.   On: 09/27/2021 12:18   CT C-SPINE NO CHARGE  Result Date: 09/26/2021 CLINICAL DATA:  Found on floor. EXAM: CT CERVICAL SPINE WITHOUT CONTRAST TECHNIQUE: Multidetector CT imaging of the cervical spine was performed without intravenous contrast. Multiplanar CT image reconstructions were also generated. RADIATION DOSE REDUCTION:  This exam was performed according to the departmental dose-optimization program which includes automated exposure control, adjustment of the mA and/or kV according to patient size and/or use of iterative reconstruction technique. COMPARISON:  None. FINDINGS: Alignment: Normal Skull base and vertebrae: No acute fracture. No primary bone lesion or focal pathologic process. Soft tissues and spinal canal: No prevertebral fluid or swelling. No visible canal hematoma. Disc levels: Advanced diffuse degenerative facet disease bilaterally. Upper chest: Emphysema.  No acute abnormality. Other: None IMPRESSION: Advanced diffuse degenerative facet disease. No acute bony abnormality. Electronically Signed   By: Rolm Baptise M.D.   On: 09/26/2021 21:30   DG Chest Portable 1 View  Result Date: 09/26/2021 CLINICAL DATA:  Fall EXAM: PORTABLE CHEST 1 VIEW COMPARISON:  06/02/2021 FINDINGS: Right shoulder replacement. Borderline to mild cardiomegaly. No focal opacity or pleural effusion. No pneumothorax IMPRESSION: No active disease.  Borderline cardiomegaly Electronically Signed   By: Madie Reno.D.  On: 09/26/2021 20:57   DG Swallowing Func-Speech Pathology  Result Date: 09/29/2021 Table formatting from the original result was not included. Objective Swallowing Evaluation: Type of Study: MBS-Modified Barium Swallow Study  Patient Details Name: KAMARRI FISCHETTI MRN: [id number] Date of Birth: [birth year] Today's Date: 09/29/2021 Time: SLP Start Time (ACUTE ONLY): 47 -SLP Stop Time (ACUTE ONLY): 1572 SLP Time Calculation (min) (ACUTE ONLY): 13 min Past Medical History: Past Medical History: Diagnosis Date  ADD (attention deficit disorder)   Allergy   Arthritis   CKD (chronic kidney disease), stage III (HCC)   COPD (chronic obstructive pulmonary disease) ([place]) 12/09/2014  Depression   Does use hearing aid   Dyspnea   on exertion  Hyperlipidemia   Hypertension   LGSIL (low grade squamous intraepithelial dysplasia)   Osteoporosis    Pre-diabetes  Past Surgical History: Past Surgical History: Procedure Laterality Date  JOINT REPLACEMENT    right knee  MULTIPLE TOOTH EXTRACTIONS    REPLACEMENT TOTAL KNEE  2013  right  REVERSE SHOULDER ARTHROPLASTY Right 03/30/2019  Procedure: REVERSE TOTAL SHOULDER ARTHROPLASTY;  Surgeon: Netta Cedars, MD;  Location: [facility];  Service: Orthopedics;  Laterality: Right;  interscalene block  TOTAL KNEE ARTHROPLASTY Left 12/27/2017  TOTAL KNEE ARTHROPLASTY Left 12/27/2017  Procedure: LEFT TOTAL KNEE ARTHROPLASTY;  Surgeon: Netta Cedars, MD;  Location: [facility];  Service: Orthopedics;  Laterality: Left; HPI: Pt is a 71 year old woman found down by her daughter in her home. 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 PMH: COPD, current smoker, HTN, HLD, prediabetes, CKD3, ADD, L TKA, R reverse TSA, depression, hearing loss.  Subjective: alert, cooperative  Recommendations for follow up therapy are one component of a multi-disciplinary discharge planning process, led by the attending physician.  Recommendations may be updated based on patient status, additional functional criteria and insurance authorization. Assessment / Plan / Recommendation Clinical Impressions 09/29/2021 Clinical Impression Pt has a moderate oropharyngeal dysphagia with suspected esophageal component as observed during additional challenging in this repeat MBS. Oral deficits are similar to those noted on prior, with boluses spilling back into the pharynx as she works on clearing her oral cavity. Oral residue also remains after the swallow, as does vallecular residue due to reduced base of tongue retraction that is noticed more as consistencies become thicker. When pt self-regulates her pacing and bolus size, there is silent aspiration before/during the swallow with all liquid consistencies. It is not as consistent with honey thick liquids, but she  also has a small amount of silent aspiration after the swallow due to the increase in pharyngeal residue. Attempted a chin tuck to better contain boluses in the oral cavity prior to swallowing, but she does not maintain her head in a lowered position despite cues. When liquids were given via spoon, penetration reaches the true vocal folds with nectar thick liquids but is more shallow with honey thick liquids and mostly ejected spontaneously (one instance of PAS 3). Also of note, pt appeared to have barium that filled her esophagus during this study, reaching as high as the cerivcal esophagus but seemingly reduced after liquid washes (MD not present to confirm). Suspect that this could also be contributing to the build up of coughing that pt starts to exhibit across the course of a meal.  Recommend continuing Dys 1 diet but with honey thick liquids by spoon only. Would be very mindful of upright positioning  and alternating solids/liquids, keeping pt up for at least 30 minutes after meals to try to facilitate oropharyngeal and suspected esophageal issues. SLP Visit Diagnosis Dysphagia, oropharyngeal phase (R13.12) Attention and concentration deficit following -- Frontal lobe and executive function deficit following -- Impact on safety and function Mild aspiration risk;Moderate aspiration risk   Treatment Recommendations 09/29/2021 Treatment Recommendations Therapy as outlined in treatment plan below   Prognosis 09/29/2021 Prognosis for Safe Diet Advancement Good Barriers to Reach Goals -- Barriers/Prognosis Comment -- Diet Recommendations 09/29/2021 SLP Diet Recommendations Dysphagia 1 (Puree) solids;Honey thick liquids Liquid Administration via Spoon Medication Administration Crushed with puree Compensations Slow rate;Small sips/bites;Minimize environmental distractions;Follow solids with liquid Postural Changes Remain semi-upright after after feeds/meals (Comment);Seated upright at 90 degrees   Other Recommendations  09/29/2021 Recommended Consults Consider esophageal assessment Oral Care Recommendations Oral care BID Other Recommendations Order thickener from pharmacy;Prohibited food (jello, ice cream, thin soups);Remove water pitcher;Have oral suction available Follow Up Recommendations Acute inpatient rehab (3hours/day) Assistance recommended at discharge Frequent or constant Supervision/Assistance Functional Status Assessment Patient has had a recent decline in their functional status and demonstrates the ability to make significant improvements in function in a reasonable and predictable amount of time. Frequency and Duration  09/29/2021 Speech Therapy Frequency (ACUTE ONLY) min 2x/week Treatment Duration 2 weeks   Oral Phase 09/29/2021 Oral Phase Impaired Oral - Pudding Teaspoon -- Oral - Pudding Cup -- Oral - Honey Teaspoon Weak lingual manipulation;Reduced posterior propulsion;Lingual/palatal residue Oral - Honey Cup Reduced posterior propulsion;Decreased bolus cohesion;Weak lingual manipulation;Right anterior bolus loss;Lingual/palatal residue Oral - Nectar Teaspoon Weak lingual manipulation;Reduced posterior propulsion;Lingual/palatal residue Oral - Nectar Cup Reduced posterior propulsion;Decreased bolus cohesion;Weak lingual manipulation Oral - Nectar Straw Reduced posterior propulsion;Decreased bolus cohesion;Weak lingual manipulation Oral - Thin Teaspoon -- Oral - Thin Cup NT Oral - Thin Straw -- Oral - Puree Weak lingual manipulation;Reduced posterior propulsion;Lingual/palatal residue Oral - Mech Soft -- Oral - Regular -- Oral - Multi-Consistency -- Oral - Pill -- Oral Phase - Comment --  Pharyngeal Phase 09/29/2021 Pharyngeal Phase Impaired Pharyngeal- Pudding Teaspoon -- Pharyngeal -- Pharyngeal- Pudding Cup -- Pharyngeal -- Pharyngeal- Honey Teaspoon Penetration/Aspiration before swallow;Reduced tongue base retraction;Pharyngeal residue - valleculae Pharyngeal Material enters airway, remains ABOVE vocal cords and  not ejected out;Material enters airway, remains ABOVE vocal cords then ejected out Pharyngeal- Honey Cup Reduced tongue base retraction;Pharyngeal residue - valleculae;Penetration/Aspiration before swallow;Penetration/Apiration after swallow Pharyngeal Material enters airway, passes BELOW cords without attempt by patient to eject out (silent aspiration) Pharyngeal- Nectar Teaspoon Reduced tongue base retraction;Pharyngeal residue - valleculae;Penetration/Aspiration before swallow Pharyngeal Material enters airway, CONTACTS cords and not ejected out Pharyngeal- Nectar Cup Penetration/Aspiration during swallow;Pharyngeal residue - valleculae;Reduced tongue base retraction Pharyngeal Material enters airway, passes BELOW cords without attempt by patient to eject out (silent aspiration) Pharyngeal- Nectar Straw Penetration/Aspiration during swallow;Pharyngeal residue - valleculae;Compensatory strategies attempted (with notebox);Reduced tongue base retraction Pharyngeal Material enters airway, passes BELOW cords without attempt by patient to eject out (silent aspiration) Pharyngeal- Thin Teaspoon -- Pharyngeal -- Pharyngeal- Thin Cup NT Pharyngeal -- Pharyngeal- Thin Straw -- Pharyngeal -- Pharyngeal- Puree Reduced tongue base retraction;Pharyngeal residue - valleculae Pharyngeal -- Pharyngeal- Mechanical Soft -- Pharyngeal -- Pharyngeal- Regular -- Pharyngeal -- Pharyngeal- Multi-consistency -- Pharyngeal -- Pharyngeal- Pill -- Pharyngeal -- Pharyngeal Comment --  Cervical Esophageal Phase  09/27/2021 Cervical Esophageal Phase Impaired Pudding Teaspoon -- Pudding Cup -- Honey Teaspoon -- Honey Cup -- Nectar Teaspoon -- Nectar Cup -- Nectar Straw -- Thin Teaspoon NT Thin Cup Esophageal backflow  into cervical esophagus Thin Straw NT Puree -- Mechanical Soft -- Regular -- Multi-consistency -- Pill -- Cervical Esophageal Comment mild amount of retrograde movement of bolus in cervical esophagus Osie Bond., M.A. CCC-SLP Acute  Rehabilitation Services Pager [phone] Office [phone] 09/29/2021, 2:39 PM                     DG Swallowing Func-Speech Pathology  Result Date: 09/27/2021 Table formatting from the original result was not included. Objective Swallowing Evaluation: Type of Study: MBS-Modified Barium Swallow Study  Patient Details Name: HENRI GUEDES MRN: [id number] Date of Birth: [birth year] Today's Date: 09/27/2021 Time: SLP Start Time (ACUTE ONLY): 53 -SLP Stop Time (ACUTE ONLY): 0630 SLP Time Calculation (min) (ACUTE ONLY): 25 min Past Medical History: Past Medical History: Diagnosis Date  ADD (attention deficit disorder)   Allergy   Arthritis   CKD (chronic kidney disease), stage III (HCC)   COPD (chronic obstructive pulmonary disease) ([place]) 12/09/2014  Depression   Does use hearing aid   Dyspnea   on exertion  Hyperlipidemia   Hypertension   LGSIL (low grade squamous intraepithelial dysplasia)   Osteoporosis   Pre-diabetes  Past Surgical History: Past Surgical History: Procedure Laterality Date  JOINT REPLACEMENT    right knee  MULTIPLE TOOTH EXTRACTIONS    REPLACEMENT TOTAL KNEE  2013  right  REVERSE SHOULDER ARTHROPLASTY Right 03/30/2019  Procedure: REVERSE TOTAL SHOULDER ARTHROPLASTY;  Surgeon: Netta Cedars, MD;  Location: [facility];  Service: Orthopedics;  Laterality: Right;  interscalene block  TOTAL KNEE ARTHROPLASTY Left 12/27/2017  TOTAL KNEE ARTHROPLASTY Left 12/27/2017  Procedure: LEFT TOTAL KNEE ARTHROPLASTY;  Surgeon: Netta Cedars, MD;  Location: [facility];  Service: Orthopedics;  Laterality: Left; HPI: Pt is a 71 year old woman found down by her daughter in her home. 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 PMH: COPD, current smoker, HTN, HLD, prediabetes, CKD3, ADD, L TKA, R reverse TSA, depression, hearing loss.  Subjective: alert, cooperative  Recommendations for follow up therapy are one component of a  multi-disciplinary discharge planning process, led by the attending physician.  Recommendations may be updated based on patient status, additional functional criteria and insurance authorization. Assessment / Plan / Recommendation Clinical Impressions 09/27/2021 Clinical Impression Patient presents with a mild-moderate oropharyngeal dysphagia as per this MBS. During oral phase, patient exhibited decrased anterior to posterior transit of boluses which caused moderate delay with puree solids and mild delay with liquids. During pharyngeal phase, swallow was initiated at level of pyriform sinus with thin liquids and nectar thick liquids and at level of vallecular sinus with puree solids. Patient exhibited trace silent aspiration during the swallow with thin liquids and trace penetration with nectar thick liquids. Retention of barium observed in vallecular sinus with all tested consistencies (trace to mild with puree solids, trace with nectar and thin liquids). SLP is recommending to initiate Dys 1 (puree) solids and nectar thick liquids diet at this time. SLP Visit Diagnosis Dysphagia, oropharyngeal phase (R13.12) Attention and concentration deficit following -- Frontal lobe and executive function deficit following -- Impact on safety and function Mild aspiration risk;Moderate aspiration risk   Treatment Recommendations 09/27/2021 Treatment Recommendations Therapy as outlined in treatment plan below   Prognosis 09/27/2021 Prognosis for Safe Diet Advancement Good Barriers to Reach Goals -- Barriers/Prognosis Comment -- Diet Recommendations 09/27/2021 SLP Diet Recommendations Dysphagia 1 (Puree) solids;Nectar thick liquid Liquid Administration  via Cup Medication Administration Crushed with puree Compensations Slow rate;Small sips/bites;Follow solids with liquid Postural Changes Seated upright at 90 degrees   Other Recommendations 09/27/2021 Recommended Consults -- Oral Care Recommendations Oral care BID;Staff/trained caregiver to  provide oral care;Oral care before and after PO Other Recommendations Order thickener from pharmacy;Prohibited food (jello, ice cream, thin soups);Clarify dietary restrictions;Remove water pitcher;Have oral suction available Follow Up Recommendations Acute inpatient rehab (3hours/day) Assistance recommended at discharge Frequent or constant Supervision/Assistance Functional Status Assessment Patient has had a recent decline in their functional status and demonstrates the ability to make significant improvements in function in a reasonable and predictable amount of time. Frequency and Duration  09/27/2021 Speech Therapy Frequency (ACUTE ONLY) min 2x/week Treatment Duration 1 week   Oral Phase 09/27/2021 Oral Phase Impaired Oral - Pudding Teaspoon -- Oral - Pudding Cup -- Oral - Honey Teaspoon -- Oral - Honey Cup -- Oral - Nectar Teaspoon -- Oral - Nectar Cup Reduced posterior propulsion;Delayed oral transit;Weak lingual manipulation Oral - Nectar Straw -- Oral - Thin Teaspoon -- Oral - Thin Cup Weak lingual manipulation;Delayed oral transit;Reduced posterior propulsion Oral - Thin Straw -- Oral - Puree Delayed oral transit;Reduced posterior propulsion;Weak lingual manipulation Oral - Mech Soft -- Oral - Regular -- Oral - Multi-Consistency -- Oral - Pill -- Oral Phase - Comment --  Pharyngeal Phase 09/27/2021 Pharyngeal Phase Impaired Pharyngeal- Pudding Teaspoon -- Pharyngeal -- Pharyngeal- Pudding Cup -- Pharyngeal -- Pharyngeal- Honey Teaspoon -- Pharyngeal -- Pharyngeal- Honey Cup -- Pharyngeal -- Pharyngeal- Nectar Teaspoon -- Pharyngeal -- Pharyngeal- Nectar Cup Delayed swallow initiation-vallecula;Delayed swallow initiation-pyriform sinuses;Penetration/Aspiration during swallow;Pharyngeal residue - valleculae Pharyngeal Material enters airway, remains ABOVE vocal cords then ejected out Pharyngeal- Nectar Straw -- Pharyngeal -- Pharyngeal- Thin Teaspoon -- Pharyngeal -- Pharyngeal- Thin Cup Delayed swallow  initiation-pyriform sinuses;Reduced airway/laryngeal closure;Penetration/Aspiration during swallow;Trace aspiration;Pharyngeal residue - valleculae Pharyngeal Material enters airway, passes BELOW cords without attempt by patient to eject out (silent aspiration) Pharyngeal- Thin Straw -- Pharyngeal -- Pharyngeal- Puree Delayed swallow initiation-vallecula;Reduced pharyngeal peristalsis;Pharyngeal residue - valleculae Pharyngeal -- Pharyngeal- Mechanical Soft -- Pharyngeal -- Pharyngeal- Regular -- Pharyngeal -- Pharyngeal- Multi-consistency -- Pharyngeal -- Pharyngeal- Pill -- Pharyngeal -- Pharyngeal Comment --  Cervical Esophageal Phase  09/27/2021 Cervical Esophageal Phase Impaired Pudding Teaspoon -- Pudding Cup -- Honey Teaspoon -- Honey Cup -- Nectar Teaspoon -- Nectar Cup -- Nectar Straw -- Thin Teaspoon NT Thin Cup Esophageal backflow into cervical esophagus Thin Straw NT Puree -- Mechanical Soft -- Regular -- Multi-consistency -- Pill -- Cervical Esophageal Comment mild amount of retrograde movement of bolus in cervical esophagus Sonia Baller, MA, CCC-SLP Speech Therapy                     ECHOCARDIOGRAM COMPLETE  Result Date: 09/28/2021    ECHOCARDIOGRAM REPORT   Patient Name:   TRACIA LACOMB Date of Exam: 09/28/2021 Medical Rec #:  [id number]       Height:       61.0 in Accession #:    [id number]      Weight:       141.5 lb Date of Birth:  1950/11/22      BSA:          1.631 m Patient Age:    5 years        BP:           143/65 mmHg Patient Gender: F  HR:           84 bpm. Exam Location:  Inpatient Procedure: 2D Echo Indications:    Stroke  History:        Patient has no prior history of Echocardiogram examinations.                 Signs/Symptoms:Shortness of Breath; Risk Factors:Dyslipidemia                 and Hypertension.  Sonographer:    Arlyss Gandy Referring Phys: [place]  1. Left ventricular ejection fraction, by estimation, is 60 to 65%. The left ventricle has  normal function. The left ventricle has no regional wall motion abnormalities. Left ventricular diastolic parameters were normal.  2. Right ventricular systolic function is normal. The right ventricular size is normal.  3. The mitral valve is grossly normal. Mild mitral valve regurgitation. No evidence of mitral stenosis.  4. The aortic valve is grossly normal. Aortic valve regurgitation is not visualized. No aortic stenosis is present.  5. The inferior vena cava is normal in size with greater than 50% respiratory variability, suggesting right atrial pressure of 3 mmHg. Comparison(s): No prior Echocardiogram. Conclusion(s)/Recommendation(s): Normal biventricular function without evidence of hemodynamically significant valvular heart disease. No intracardiac source of embolism detected on this transthoracic study. Consider a transesophageal echocardiogram to exclude cardiac source of embolism if clinically indicated. FINDINGS  Left Ventricle: Left ventricular ejection fraction, by estimation, is 60 to 65%. The left ventricle has normal function. The left ventricle has no regional wall motion abnormalities. The left ventricular internal cavity size was normal in size. There is  no left ventricular hypertrophy. Left ventricular diastolic parameters were normal. Right Ventricle: The right ventricular size is normal. Right vetricular wall thickness was not well visualized. Right ventricular systolic function is normal. Left Atrium: Left atrial size was normal in size. Right Atrium: Right atrial size was normal in size. Pericardium: There is no evidence of pericardial effusion. Mitral Valve: The mitral valve is grossly normal. Mild mitral valve regurgitation. No evidence of mitral valve stenosis. Tricuspid Valve: The tricuspid valve is normal in structure. Tricuspid valve regurgitation is trivial. No evidence of tricuspid stenosis. Aortic Valve: The aortic valve is grossly normal. Aortic valve regurgitation is not  visualized. No aortic stenosis is present. Aortic valve mean gradient measures 3.0 mmHg. Aortic valve peak gradient measures 6.6 mmHg. Aortic valve area, by VTI measures 2.45 cm. Pulmonic Valve: The pulmonic valve was not well visualized. Pulmonic valve regurgitation is not visualized. No evidence of pulmonic stenosis. Aorta: The aortic root, ascending aorta, aortic arch and descending aorta are all structurally normal, with no evidence of dilitation or obstruction. Venous: The inferior vena cava is normal in size with greater than 50% respiratory variability, suggesting right atrial pressure of 3 mmHg. IAS/Shunts: The atrial septum is grossly normal.  LEFT VENTRICLE PLAX 2D LVIDd:         3.70 cm   Diastology LVIDs:         2.50 cm   LV e' medial:    8.27 cm/s LV PW:         1.00 cm   LV E/e' medial:  9.0 LV IVS:        1.10 cm   LV e' lateral:   11.60 cm/s LVOT diam:     2.00 cm   LV E/e' lateral: 6.4 LV SV:         63 LV SV Index:   39  LVOT Area:     3.14 cm  RIGHT VENTRICLE             IVC RV Basal diam:  2.90 cm     IVC diam: 1.80 cm RV Mid diam:    2.70 cm RV S prime:     11.10 cm/s TAPSE (M-mode): 2.0 cm LEFT ATRIUM             Index        RIGHT ATRIUM           Index LA diam:        3.00 cm 1.84 cm/m   RA Area:     10.40 cm LA Vol (A2C):   43.0 ml 26.37 ml/m  RA Volume:   20.70 ml  12.69 ml/m LA Vol (A4C):   24.8 ml 15.21 ml/m LA Biplane Vol: 35.8 ml 21.95 ml/m  AORTIC VALVE AV Area (Vmax):    2.00 cm AV Area (Vmean):   2.33 cm AV Area (VTI):     2.45 cm AV Vmax:           128.00 cm/s AV Vmean:          77.800 cm/s AV VTI:            0.258 m AV Peak Grad:      6.6 mmHg AV Mean Grad:      3.0 mmHg LVOT Vmax:         81.60 cm/s LVOT Vmean:        57.800 cm/s LVOT VTI:          0.201 m LVOT/AV VTI ratio: 0.78  AORTA Ao Root diam: 2.70 cm Ao Asc diam:  3.10 cm MITRAL VALVE MV Area (PHT): 4.17 cm    SHUNTS MV Decel Time: 182 msec    Systemic VTI:  0.20 m MV E velocity: 74.10 cm/s  Systemic Diam: 2.00  cm MV A velocity: 79.30 cm/s MV E/A ratio:  0.93 Buford Dresser MD Electronically signed by Buford Dresser MD Signature Date/Time: 09/28/2021/11:36:55 AM    Final    VAS US CAROTID  Result Date: 09/27/2021 Carotid Arterial Duplex Study Patient Name:  Durene Cal  Date of Exam:   09/27/2021 Medical Rec #: [id number]        Accession #:    [id number] Date of Birth: February 17, 1951       Patient Gender: F Patient Age:   43 years Exam Location:  [facility] Procedure:      VAS US CAROTID Referring Phys: Aldona Bar RHYNE --------------------------------------------------------------------------------  Risk Factors:      Hypertension, hyperlipidemia. Comparison Study:  09/26/2021 - CTA neck:                     1. The left common and internal carotid arteries are patent                    within                    the neck. Prominent atherosclerotic plaque about the left                    carotid                    bifurcation and within the proximal ICA. Exact quantification                    of  stenosis at the origin of the left ICA is difficult due to                    blooming                    artifact from calcified plaque at this site, but appears                    greater                    than 80%. Consider correlation with a carotid artery duplex                    ultrasound.                    2. The right common and internal carotid arteries are patent                    within                    the neck without hemodynamically significant stenosis. Mild                    atherosclerotic plaque within the right carotid system within                    the                    neck, as described.                    3. Vertebral arteries patent within the neck without                    significant                    stenosis.                    4. Aortic Atherosclerosis (ICD10-I70.0) and Emphysema                    (ICD10-J43.9). Performing Technologist: Oliver Hum  RVT  Examination Guidelines: A complete evaluation includes B-mode imaging, spectral Doppler, color Doppler, and power Doppler as needed of all accessible portions of each vessel. Bilateral testing is considered an integral part of a complete examination. Limited examinations for reoccurring indications may be performed as noted.  Right Carotid Findings: +----------+--------+--------+--------+-----------------------+--------+             PSV cm/s EDV cm/s Stenosis Plaque Description      Comments  +----------+--------+--------+--------+-----------------------+--------+  CCA Prox   90       19                smooth and heterogenous           +----------+--------+--------+--------+-----------------------+--------+  CCA Distal 73       21                smooth and heterogenous           +----------+--------+--------+--------+-----------------------+--------+  ICA Prox   74       22                calcific                          +----------+--------+--------+--------+-----------------------+--------+  ICA Distal 111      38                                        tortuous  +----------+--------+--------+--------+-----------------------+--------+  ECA        97       12                                                  +----------+--------+--------+--------+-----------------------+--------+ +----------+--------+-------+--------+-------------------+             PSV cm/s EDV cms Describe Arm Pressure (mmHG)  +----------+--------+-------+--------+-------------------+  Subclavian 150                                            +----------+--------+-------+--------+-------------------+ +---------+--------+--+--------+--+---------+  Vertebral PSV cm/s 88 EDV cm/s 17 Antegrade  +---------+--------+--+--------+--+---------+  Left Carotid Findings: +----------+-------+-------+--------+---------------------------------+--------+             PSV     EDV     Stenosis Plaque Description                Comments              cm/s    cm/s                                                          +----------+-------+-------+--------+---------------------------------+--------+  CCA Prox   71      10               smooth and heterogenous                     +----------+-------+-------+--------+---------------------------------+--------+  CCA Distal 55      5                smooth and heterogenous                     +----------+-------+-------+--------+---------------------------------+--------+  ICA Prox   413     127     80-99%   calcific, irregular and                                                          heterogenous                                +----------+-------+-------+--------+---------------------------------+--------+  ICA Mid    157     33                                                           +----------+-------+-------+--------+---------------------------------+--------+  ICA Distal 63      19                                                 tortuous  +----------+-------+-------+--------+---------------------------------+--------+  ECA        166     18                                                           +----------+-------+-------+--------+---------------------------------+--------+ +----------+--------+--------+--------+-------------------+             PSV cm/s EDV cm/s Describe Arm Pressure (mmHG)  +----------+--------+--------+--------+-------------------+  Subclavian 134                                             +----------+--------+--------+--------+-------------------+ +---------+--------+--+--------+--+---------+  Vertebral PSV cm/s 73 EDV cm/s 17 Antegrade  +---------+--------+--+--------+--+---------+   Summary: Right Carotid: Velocities in the right ICA are consistent with a 1-39% stenosis. Left Carotid: Velocities in the left ICA are consistent with a 80-99% stenosis. Vertebrals: Bilateral vertebral arteries demonstrate antegrade flow. *See table(s) above for measurements and observations.  Electronically signed by  Harold Barban MD on 09/27/2021 at 8:54:19 PM.    Final     Microbiology: Results for orders placed or performed during the hospital encounter of 09/26/21  Resp Panel by RT-PCR (Flu A&B, Covid) Nasopharyngeal Swab     Status: None   Collection Time: 09/26/21  7:50 PM   Specimen: Nasopharyngeal Swab; Nasopharyngeal(NP) swabs in vial transport medium  Result Value Ref Range Status   SARS Coronavirus 2 by RT PCR NEGATIVE NEGATIVE Final    Comment: (NOTE) SARS-CoV-2 target nucleic acids are NOT DETECTED.  The SARS-CoV-2 RNA is generally detectable in upper respiratory specimens during the acute phase of infection. The lowest concentration of SARS-CoV-2 viral copies this assay can detect is 138 copies/mL. A negative result does not preclude SARS-Cov-2 infection and should not be used as the sole basis for treatment or other patient management decisions. A negative result may occur with  improper specimen collection/handling, submission of specimen other than nasopharyngeal swab, presence of viral mutation(s) within the areas targeted by this assay, and inadequate number of viral copies(<138 copies/mL). A negative result must be combined with clinical observations, patient history, and epidemiological information. The expected result is Negative.  Fact Sheet for Patients:  EntrepreneurPulse.com.au  Fact Sheet for Healthcare Providers:  IncredibleEmployment.be  This test is no t yet approved or cleared by the [place] FDA and  has been authorized for detection and/or diagnosis of SARS-CoV-2 by FDA under an Emergency Use Authorization (EUA). This EUA will remain  in effect (meaning this test can be used) for the duration of the COVID-19 declaration under Section 564(b)(1) of the Act, 21 U.S.C.section 360bbb-3(b)(1), unless the authorization is terminated  or revoked sooner.       Influenza A by PCR NEGATIVE NEGATIVE Final   Influenza B by PCR  NEGATIVE NEGATIVE Final    Comment: (NOTE) The Xpert Xpress SARS-CoV-2/FLU/RSV plus assay is intended as an aid in the diagnosis of influenza from Nasopharyngeal  swab specimens and should not be used as a sole basis for treatment. Nasal washings and aspirates are unacceptable for Xpert Xpress SARS-CoV-2/FLU/RSV testing.  Fact Sheet for Patients: EntrepreneurPulse.com.au  Fact Sheet for Healthcare Providers: IncredibleEmployment.be  This test is not yet approved or cleared by the [place] FDA and has been authorized for detection and/or diagnosis of SARS-CoV-2 by FDA under an Emergency Use Authorization (EUA). This EUA will remain in effect (meaning this test can be used) for the duration of the COVID-19 declaration under Section 564(b)(1) of the Act, 21 U.S.C. section 360bbb-3(b)(1), unless the authorization is terminated or revoked.  Performed at [facility], [place] [address] [place], [place] [address]   Resp Panel by RT-PCR (Flu A&B, Covid) Nasopharyngeal Swab     Status: None   Collection Time: 10/01/21 11:32 AM   Specimen: Nasopharyngeal Swab; Nasopharyngeal(NP) swabs in vial transport medium  Result Value Ref Range Status   SARS Coronavirus 2 by RT PCR NEGATIVE NEGATIVE Final    Comment: (NOTE) SARS-CoV-2 target nucleic acids are NOT DETECTED.  The SARS-CoV-2 RNA is generally detectable in upper respiratory specimens during the acute phase of infection. The lowest concentration of SARS-CoV-2 viral copies this assay can detect is 138 copies/mL. A negative result does not preclude SARS-Cov-2 infection and should not be used as the sole basis for treatment or other patient management decisions. A negative result may occur with  improper specimen collection/handling, submission of specimen other than nasopharyngeal swab, presence of viral mutation(s) within the areas targeted by this assay, and inadequate number of  viral copies(<138 copies/mL). A negative result must be combined with clinical observations, patient history, and epidemiological information. The expected result is Negative.  Fact Sheet for Patients:  EntrepreneurPulse.com.au  Fact Sheet for Healthcare Providers:  IncredibleEmployment.be  This test is no t yet approved or cleared by the [place] FDA and  has been authorized for detection and/or diagnosis of SARS-CoV-2 by FDA under an Emergency Use Authorization (EUA). This EUA will remain  in effect (meaning this test can be used) for the duration of the COVID-19 declaration under Section 564(b)(1) of the Act, 21 U.S.C.section 360bbb-3(b)(1), unless the authorization is terminated  or revoked sooner.       Influenza A by PCR NEGATIVE NEGATIVE Final   Influenza B by PCR NEGATIVE NEGATIVE Final    Comment: (NOTE) The Xpert Xpress SARS-CoV-2/FLU/RSV plus assay is intended as an aid in the diagnosis of influenza from Nasopharyngeal swab specimens and should not be used as a sole basis for treatment. Nasal washings and aspirates are unacceptable for Xpert Xpress SARS-CoV-2/FLU/RSV testing.  Fact Sheet for Patients: EntrepreneurPulse.com.au  Fact Sheet for Healthcare Providers: IncredibleEmployment.be  This test is not yet approved or cleared by the [place] FDA and has been authorized for detection and/or diagnosis of SARS-CoV-2 by FDA under an Emergency Use Authorization (EUA). This EUA will remain in effect (meaning this test can be used) for the duration of the COVID-19 declaration under Section 564(b)(1) of the Act, 21 U.S.C. section 360bbb-3(b)(1), unless the authorization is terminated or revoked.  Performed at [facility], [place] [address] [place], [place] [address]     Labs: CBC: Recent Labs  Lab 09/26/21 2019 09/26/21 2030 09/29/21 0047  WBC  --  14.8* 8.0  NEUTROABS   --  12.3* 4.3  HGB 10.9* 10.3* 9.6*  HCT 32.0* 34.4* 31.2*  MCV  --  91.7 91.2  PLT  --  400 360  Basic Metabolic Panel: Recent Labs  Lab 09/26/21 2019 09/26/21 2030 09/29/21 0047 09/30/21 1542  NA 141 143 149* 144  K 6.1* 4.0 3.7 3.7  CL 113* 108 114* 110  CO2  --  17* 25 24  GLUCOSE 107* 101* 107* 151*  BUN 52* 32* 30* 26*  CREATININE 1.10* 1.24* 1.21* 1.19*  CALCIUM  --  9.8 9.2 8.9   Liver Function Tests: Recent Labs  Lab 09/26/21 2030  AST 140*  ALT 76*  ALKPHOS 142*  BILITOT 0.5  PROT 8.3*  ALBUMIN 3.8   CBG: No results for input(s): GLUCAP in the last 168 hours.  Discharge time spent: greater than 30 minutes.  Signed: Hosie Poisson, MD [facility] 10/02/2021

## 2021-10-02 NOTE — Progress Notes (Signed)
Occupational Therapy Treatment Patient Details Name: Sheryl [name] MRN: [id number] DOB: [birth year] Today's Date: 10/02/2021   History of present illness Pt is a 71 year old woman found down by her daughter in her home, admitted on 2/7. 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 PMH: COPD, current smoker, HTN, HLD, prediabetes, CKD3, ADD, L TKA, R reverse TSA, depression, hearling loss.   OT comments  Patient received in bed and asking to get out of bed. Patient was mod assist to get to EOB with verbal cues to move BLEs off bed and use of rail. Patient was face to face transfer to recliner for safety and performed grooming seated. Patient stood at sink to bathe bottom with right knee blocked.  Patient instructed on SROM for RUE for shoulder flexion and elbow flexion/extension. Acute OT to continue to follow.    Recommendations for follow up therapy are one component of a multi-disciplinary discharge planning process, led by the attending physician.  Recommendations may be updated based on patient status, additional functional criteria and insurance authorization.    Follow Up Recommendations  Skilled nursing-short term rehab (<3 hours/day)    Assistance Recommended at Discharge Frequent or constant Supervision/Assistance  Patient can return home with the following  Two people to help with walking and/or transfers;Two people to help with bathing/dressing/bathroom;Assistance with cooking/housework;Assistance with feeding;Direct supervision/assist for medications management;Direct supervision/assist for financial management;Assist for transportation;Help with stairs or ramp for entrance   Equipment Recommendations  Other (comment) (TBD)    Recommendations for Other Services      Precautions / Restrictions Precautions Precautions: Fall Precaution Comments: R  hemiparesis Restrictions Weight Bearing Restrictions: No Other Position/Activity Restrictions: R LE weakness with all mobility       Mobility Bed Mobility Overal bed mobility: Needs Assistance Bed Mobility: Supine to Sit     Supine to sit: Mod assist     General bed mobility comments: verbal cues to move LEs to EOB and to use rail    Transfers Overall transfer level: Needs assistance Equipment used: None Transfers: Sit to/from Stand, Bed to chair/wheelchair/BSC Sit to Stand: Mod assist Stand pivot transfers: Max assist         General transfer comment: face to face transfer to recliner for safety     Balance Overall balance assessment: Needs assistance Sitting-balance support: Single extremity supported Sitting balance-Leahy Scale: Poor Sitting balance - Comments: min to min guard assist Postural control: Posterior lean, Right lateral lean Standing balance support: Single extremity supported, During functional activity Standing balance-Leahy Scale: Poor Standing balance comment: stood at sink with LUE assisting with balance and RLE knee blocked                           ADL either performed or assessed with clinical judgement   ADL Overall ADL's : Needs assistance/impaired     Grooming: Wash/dry hands;Wash/dry face;Brushing hair;Supervision/safety;Sitting Grooming Details (indicate cue type and reason): able to perform light grooming seated in recliner     Lower Body Bathing: Maximal assistance;Sit to/from stand Lower Body Bathing Details (indicate cue type and reason): cleaned bottom while standing at sink with assistance to block right knee                       General ADL Comments: uanble to use RUE to assist wtih self care  Extremity/Trunk Assessment Upper Extremity Assessment RUE Deficits / Details: dense R hemiplegia, pt reports equal sensation B UEs RUE Coordination: decreased fine motor;decreased gross motor             Vision       Perception     Praxis      Cognition Arousal/Alertness: Awake/alert Behavior During Therapy: Impulsive Overall Cognitive Status: Difficult to assess                                 General Comments: eager to get into recliner. Required cues due to impulsiness        Exercises Exercises: General Upper Extremity General Exercises - Upper Extremity Shoulder Flexion: Self ROM, Right, 10 reps Elbow Flexion: Self ROM, Right, 10 reps, Seated Elbow Extension: Self ROM, Right, 10 reps, Seated    Shoulder Instructions       General Comments      Pertinent Vitals/ Pain       Pain Assessment Pain Assessment: Faces Faces Pain Scale: No hurt Pain Intervention(s): Monitored during session  Home Living                                          Prior Functioning/Environment              Frequency  Min 3X/week        Progress Toward Goals  OT Goals(current goals can now be found in the care plan section)  Progress towards OT goals: Progressing toward goals  Acute Rehab OT Goals OT Goal Formulation: With patient/family Time For Goal Achievement: 10/11/21 Potential to Achieve Goals: Good ADL Goals Pt Will Perform Eating: sitting;with supervision Pt Will Perform Grooming: with set-up;sitting Pt Will Perform Upper Body Bathing: sitting;with min assist Pt Will Perform Upper Body Dressing: with min assist;sitting Pt Will Transfer to Toilet: with mod assist;stand pivot transfer;bedside commode Additional ADL Goal #1: Pt will demonstrate fair sitting balance x 10 minutes in preparation for ADL. Additional ADL Goal #2: Pt will perform bed mobility with min assist in preparation for ADL. Additional ADL Goal #3: Pt will protect R UE from injury during mobility.  Plan Discharge plan remains appropriate    Co-evaluation                 AM-PAC OT "6 Clicks" Daily Activity     Outcome Measure   Help from another  person eating meals?: A Little Help from another person taking care of personal grooming?: A Little Help from another person toileting, which includes using toliet, bedpan, or urinal?: Total Help from another person bathing (including washing, rinsing, drying)?: A Lot Help from another person to put on and taking off regular upper body clothing?: A Lot Help from another person to put on and taking off regular lower body clothing?: Total 6 Click Score: 12    End of Session Equipment Utilized During Treatment: Gait belt  OT Visit Diagnosis: Unsteadiness on feet (R26.81);Hemiplegia and hemiparesis Hemiplegia - Right/Left: Right Hemiplegia - dominant/non-dominant: Non-Dominant Hemiplegia - caused by: Cerebral infarction   Activity Tolerance Patient tolerated treatment well   Patient Left in chair;with call bell/phone within reach   Nurse Communication Mobility status;Need for lift equipment        Time: 0737-0802 OT Time Calculation (min): 25 min  Charges: OT General Charges $OT  Visit: 1 Visit OT Treatments $Self Care/Home Management : 23-37 mins  Lodema Hong, [place]  Pager [phone] Office [phone]   Trixie Dredge 10/02/2021, 9:13 AM

## 2021-10-02 NOTE — Progress Notes (Signed)
Speech Language Pathology Treatment: Dysphagia  Patient Details Name: Sheryl [name] MRN: [id number] DOB: 1951-06-05 Today's Date: 10/02/2021 Time: 4315-4008 SLP Time Calculation (min) (ACUTE ONLY): 25 min  Assessment / Plan / Recommendation Clinical Impression  SWALLOWING Pt tolerating puree diet with HTL by spoon without overt s/s of aspiration.  Pt is able to use spoon for liquids independently.  Pt participated in swallowing exercises as noted below.  Pt with improved ability to execute swallowing exercises with modified swallow gesture. Provided education with demonstration, verbally, and in writing.  Pt's family demonstrated ability to complete exercises.  COMMUNICATION Not directly addressed this session.  Pt demonstrated improved ability to follow complex directions for swallow exercises.  Pt communicating in short phrases and was 100% intelligible in unknown context.    Pt is an excellent candidate for speech therapy, to continue at next level of care. Recommend continuing puree diet with HTL by spoon Consider repeat MBSS in 2-3 weeks to reassess swallow function.   Swallow Exercises Masako No of reps: 1 Effort: Excellent Accuracy: Good - sucessful execution after 4-5 attempts  Mendelssohn No of Reps: 2 Effort: Excellent Accuracy: Good - longer hold on first than second   CTAR No of reps: 5 Effort: Good - benefits from encouragement Accuracy: Good Comments: Fatigued over course of reps  Shaker Head lift/hold No of reps: 3 Effort: Good Accuracy: Good    HPI HPI: Pt is a 71 year old woman found down by her daughter in her home. 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 PMH: COPD, current smoker, HTN, HLD, prediabetes, CKD3, ADD, L TKA, R reverse TSA, depression, hearing loss.      SLP Plan  Continue with current plan of care      Recommendations for  follow up therapy are one component of a multi-disciplinary discharge planning process, led by the attending physician.  Recommendations may be updated based on patient status, additional functional criteria and insurance authorization.    Recommendations  Diet recommendations: Dysphagia 1 (puree);Honey-thick liquid Liquids provided via: Teaspoon Medication Administration: Crushed with puree Supervision: Full supervision/cueing for compensatory strategies;Staff to assist with self feeding Compensations: Slow rate;Small sips/bites;Minimize environmental distractions;Follow solids with liquid Postural Changes and/or Swallow Maneuvers: Seated upright 90 degrees;Upright 30-60 min after meal                Oral Care Recommendations: Oral care BID Follow Up Recommendations: Acute inpatient rehab (3hours/day) Assistance recommended at discharge: Frequent or constant Supervision/Assistance SLP Visit Diagnosis: Dysphagia, oropharyngeal phase (R13.12) Plan: Continue with current plan of care           Celedonio Savage , [place], [place] Office: [phone]  10/02/2021, 12:42 PM

## 2021-10-03 DIAGNOSIS — J449 Chronic obstructive pulmonary disease, unspecified: Secondary | ICD-10-CM | POA: Diagnosis not present

## 2021-10-03 DIAGNOSIS — J9601 Acute respiratory failure with hypoxia: Secondary | ICD-10-CM | POA: Diagnosis not present

## 2021-10-03 DIAGNOSIS — E785 Hyperlipidemia, unspecified: Secondary | ICD-10-CM | POA: Diagnosis not present

## 2021-10-03 DIAGNOSIS — I10 Essential (primary) hypertension: Secondary | ICD-10-CM | POA: Diagnosis not present

## 2021-10-03 DIAGNOSIS — G8191 Hemiplegia, unspecified affecting right dominant side: Secondary | ICD-10-CM | POA: Diagnosis not present

## 2021-10-04 DIAGNOSIS — R0602 Shortness of breath: Secondary | ICD-10-CM | POA: Diagnosis not present

## 2021-10-04 DIAGNOSIS — R062 Wheezing: Secondary | ICD-10-CM | POA: Diagnosis not present

## 2021-10-04 DIAGNOSIS — I69391 Dysphagia following cerebral infarction: Secondary | ICD-10-CM | POA: Diagnosis not present

## 2021-10-04 DIAGNOSIS — R0989 Other specified symptoms and signs involving the circulatory and respiratory systems: Secondary | ICD-10-CM | POA: Diagnosis not present

## 2021-10-04 DIAGNOSIS — R051 Acute cough: Secondary | ICD-10-CM | POA: Diagnosis not present

## 2021-10-05 DIAGNOSIS — I69391 Dysphagia following cerebral infarction: Secondary | ICD-10-CM | POA: Diagnosis not present

## 2021-10-05 DIAGNOSIS — J189 Pneumonia, unspecified organism: Secondary | ICD-10-CM | POA: Diagnosis not present

## 2021-10-05 DIAGNOSIS — J9601 Acute respiratory failure with hypoxia: Secondary | ICD-10-CM | POA: Diagnosis not present

## 2021-10-05 DIAGNOSIS — D72829 Elevated white blood cell count, unspecified: Secondary | ICD-10-CM | POA: Diagnosis not present

## 2021-10-07 ENCOUNTER — Inpatient Hospital Stay (HOSPITAL_COMMUNITY): Payer: Medicare PPO

## 2021-10-07 ENCOUNTER — Inpatient Hospital Stay (HOSPITAL_COMMUNITY)
Admission: EM | Admit: 2021-10-07 | Discharge: 2021-10-11 | DRG: 871 | Disposition: A | Discharge status: Hospice | Payer: Medicare PPO | Source: Skilled Nursing Facility | Attending: Pulmonary Disease | Admitting: Pulmonary Disease

## 2021-10-07 ENCOUNTER — Emergency Department (HOSPITAL_COMMUNITY): Payer: Medicare PPO

## 2021-10-07 ENCOUNTER — Encounter (HOSPITAL_COMMUNITY): Payer: Self-pay

## 2021-10-07 ENCOUNTER — Other Ambulatory Visit: Payer: Self-pay

## 2021-10-07 DIAGNOSIS — I639 Cerebral infarction, unspecified: Secondary | ICD-10-CM | POA: Diagnosis not present

## 2021-10-07 DIAGNOSIS — J9622 Acute and chronic respiratory failure with hypercapnia: Secondary | ICD-10-CM | POA: Diagnosis not present

## 2021-10-07 DIAGNOSIS — J690 Pneumonitis due to inhalation of food and vomit: Secondary | ICD-10-CM | POA: Diagnosis present

## 2021-10-07 DIAGNOSIS — K760 Fatty (change of) liver, not elsewhere classified: Secondary | ICD-10-CM | POA: Diagnosis present

## 2021-10-07 DIAGNOSIS — N179 Acute kidney failure, unspecified: Secondary | ICD-10-CM | POA: Diagnosis present

## 2021-10-07 DIAGNOSIS — J9621 Acute and chronic respiratory failure with hypoxia: Secondary | ICD-10-CM | POA: Diagnosis present

## 2021-10-07 DIAGNOSIS — R000 Tachycardia, unspecified: Secondary | ICD-10-CM | POA: Diagnosis not present

## 2021-10-07 DIAGNOSIS — J441 Chronic obstructive pulmonary disease with (acute) exacerbation: Secondary | ICD-10-CM | POA: Diagnosis present

## 2021-10-07 DIAGNOSIS — K5939 Other megacolon: Secondary | ICD-10-CM | POA: Diagnosis not present

## 2021-10-07 DIAGNOSIS — R21 Rash and other nonspecific skin eruption: Secondary | ICD-10-CM | POA: Diagnosis present

## 2021-10-07 DIAGNOSIS — J189 Pneumonia, unspecified organism: Secondary | ICD-10-CM | POA: Diagnosis not present

## 2021-10-07 DIAGNOSIS — R221 Localized swelling, mass and lump, neck: Secondary | ICD-10-CM | POA: Diagnosis not present

## 2021-10-07 DIAGNOSIS — Z20822 Contact with and (suspected) exposure to covid-19: Secondary | ICD-10-CM | POA: Diagnosis present

## 2021-10-07 DIAGNOSIS — I129 Hypertensive chronic kidney disease with stage 1 through stage 4 chronic kidney disease, or unspecified chronic kidney disease: Secondary | ICD-10-CM | POA: Diagnosis present

## 2021-10-07 DIAGNOSIS — I69351 Hemiplegia and hemiparesis following cerebral infarction affecting right dominant side: Secondary | ICD-10-CM

## 2021-10-07 DIAGNOSIS — Z823 Family history of stroke: Secondary | ICD-10-CM

## 2021-10-07 DIAGNOSIS — M810 Age-related osteoporosis without current pathological fracture: Secondary | ICD-10-CM | POA: Diagnosis present

## 2021-10-07 DIAGNOSIS — Z7982 Long term (current) use of aspirin: Secondary | ICD-10-CM

## 2021-10-07 DIAGNOSIS — I952 Hypotension due to drugs: Secondary | ICD-10-CM

## 2021-10-07 DIAGNOSIS — Z83438 Family history of other disorder of lipoprotein metabolism and other lipidemia: Secondary | ICD-10-CM

## 2021-10-07 DIAGNOSIS — R0902 Hypoxemia: Secondary | ICD-10-CM | POA: Diagnosis not present

## 2021-10-07 DIAGNOSIS — J9601 Acute respiratory failure with hypoxia: Secondary | ICD-10-CM | POA: Diagnosis present

## 2021-10-07 DIAGNOSIS — E11649 Type 2 diabetes mellitus with hypoglycemia without coma: Secondary | ICD-10-CM | POA: Diagnosis not present

## 2021-10-07 DIAGNOSIS — J90 Pleural effusion, not elsewhere classified: Secondary | ICD-10-CM | POA: Diagnosis not present

## 2021-10-07 DIAGNOSIS — D649 Anemia, unspecified: Secondary | ICD-10-CM | POA: Diagnosis present

## 2021-10-07 DIAGNOSIS — R471 Dysarthria and anarthria: Secondary | ICD-10-CM | POA: Diagnosis present

## 2021-10-07 DIAGNOSIS — Z7401 Bed confinement status: Secondary | ICD-10-CM

## 2021-10-07 DIAGNOSIS — Z7902 Long term (current) use of antithrombotics/antiplatelets: Secondary | ICD-10-CM

## 2021-10-07 DIAGNOSIS — R1310 Dysphagia, unspecified: Secondary | ICD-10-CM | POA: Diagnosis present

## 2021-10-07 DIAGNOSIS — Z9981 Dependence on supplemental oxygen: Secondary | ICD-10-CM

## 2021-10-07 DIAGNOSIS — Z8371 Family history of colonic polyps: Secondary | ICD-10-CM

## 2021-10-07 DIAGNOSIS — D62 Acute posthemorrhagic anemia: Secondary | ICD-10-CM | POA: Diagnosis not present

## 2021-10-07 DIAGNOSIS — E874 Mixed disorder of acid-base balance: Secondary | ICD-10-CM | POA: Diagnosis present

## 2021-10-07 DIAGNOSIS — A419 Sepsis, unspecified organism: Secondary | ICD-10-CM | POA: Diagnosis present

## 2021-10-07 DIAGNOSIS — K5730 Diverticulosis of large intestine without perforation or abscess without bleeding: Secondary | ICD-10-CM | POA: Diagnosis not present

## 2021-10-07 DIAGNOSIS — Z96652 Presence of left artificial knee joint: Secondary | ICD-10-CM | POA: Diagnosis present

## 2021-10-07 DIAGNOSIS — G9341 Metabolic encephalopathy: Secondary | ICD-10-CM

## 2021-10-07 DIAGNOSIS — Z79899 Other long term (current) drug therapy: Secondary | ICD-10-CM

## 2021-10-07 DIAGNOSIS — E041 Nontoxic single thyroid nodule: Secondary | ICD-10-CM | POA: Diagnosis not present

## 2021-10-07 DIAGNOSIS — T4275XA Adverse effect of unspecified antiepileptic and sedative-hypnotic drugs, initial encounter: Secondary | ICD-10-CM | POA: Diagnosis not present

## 2021-10-07 DIAGNOSIS — N1831 Chronic kidney disease, stage 3a: Secondary | ICD-10-CM | POA: Diagnosis present

## 2021-10-07 DIAGNOSIS — R4701 Aphasia: Secondary | ICD-10-CM | POA: Diagnosis present

## 2021-10-07 DIAGNOSIS — R6521 Severe sepsis with septic shock: Secondary | ICD-10-CM | POA: Diagnosis present

## 2021-10-07 DIAGNOSIS — G902 Horner's syndrome: Secondary | ICD-10-CM | POA: Diagnosis present

## 2021-10-07 DIAGNOSIS — F329 Major depressive disorder, single episode, unspecified: Secondary | ICD-10-CM | POA: Diagnosis present

## 2021-10-07 DIAGNOSIS — Z66 Do not resuscitate: Secondary | ICD-10-CM

## 2021-10-07 DIAGNOSIS — R918 Other nonspecific abnormal finding of lung field: Secondary | ICD-10-CM | POA: Diagnosis not present

## 2021-10-07 DIAGNOSIS — F17210 Nicotine dependence, cigarettes, uncomplicated: Secondary | ICD-10-CM | POA: Diagnosis present

## 2021-10-07 DIAGNOSIS — Z515 Encounter for palliative care: Secondary | ICD-10-CM | POA: Diagnosis not present

## 2021-10-07 DIAGNOSIS — F419 Anxiety disorder, unspecified: Secondary | ICD-10-CM | POA: Diagnosis present

## 2021-10-07 DIAGNOSIS — I6529 Occlusion and stenosis of unspecified carotid artery: Secondary | ICD-10-CM | POA: Diagnosis not present

## 2021-10-07 DIAGNOSIS — G936 Cerebral edema: Secondary | ICD-10-CM | POA: Diagnosis not present

## 2021-10-07 DIAGNOSIS — I517 Cardiomegaly: Secondary | ICD-10-CM | POA: Diagnosis not present

## 2021-10-07 DIAGNOSIS — Z8261 Family history of arthritis: Secondary | ICD-10-CM

## 2021-10-07 DIAGNOSIS — Z9289 Personal history of other medical treatment: Secondary | ICD-10-CM

## 2021-10-07 DIAGNOSIS — I612 Nontraumatic intracerebral hemorrhage in hemisphere, unspecified: Secondary | ICD-10-CM | POA: Diagnosis present

## 2021-10-07 DIAGNOSIS — R6520 Severe sepsis without septic shock: Secondary | ICD-10-CM | POA: Diagnosis not present

## 2021-10-07 DIAGNOSIS — I63512 Cerebral infarction due to unspecified occlusion or stenosis of left middle cerebral artery: Secondary | ICD-10-CM | POA: Diagnosis present

## 2021-10-07 DIAGNOSIS — E876 Hypokalemia: Secondary | ICD-10-CM | POA: Diagnosis present

## 2021-10-07 DIAGNOSIS — R414 Neurologic neglect syndrome: Secondary | ICD-10-CM | POA: Diagnosis present

## 2021-10-07 DIAGNOSIS — I2510 Atherosclerotic heart disease of native coronary artery without angina pectoris: Secondary | ICD-10-CM | POA: Diagnosis present

## 2021-10-07 DIAGNOSIS — F988 Other specified behavioral and emotional disorders with onset usually occurring in childhood and adolescence: Secondary | ICD-10-CM | POA: Diagnosis present

## 2021-10-07 DIAGNOSIS — R0602 Shortness of breath: Secondary | ICD-10-CM | POA: Diagnosis not present

## 2021-10-07 DIAGNOSIS — Z4659 Encounter for fitting and adjustment of other gastrointestinal appliance and device: Secondary | ICD-10-CM

## 2021-10-07 DIAGNOSIS — I69391 Dysphagia following cerebral infarction: Secondary | ICD-10-CM

## 2021-10-07 DIAGNOSIS — I6522 Occlusion and stenosis of left carotid artery: Secondary | ICD-10-CM | POA: Diagnosis not present

## 2021-10-07 DIAGNOSIS — E782 Mixed hyperlipidemia: Secondary | ICD-10-CM | POA: Diagnosis not present

## 2021-10-07 DIAGNOSIS — J439 Emphysema, unspecified: Secondary | ICD-10-CM | POA: Diagnosis not present

## 2021-10-07 DIAGNOSIS — Z4682 Encounter for fitting and adjustment of non-vascular catheter: Secondary | ICD-10-CM | POA: Diagnosis not present

## 2021-10-07 DIAGNOSIS — E1122 Type 2 diabetes mellitus with diabetic chronic kidney disease: Secondary | ICD-10-CM | POA: Diagnosis present

## 2021-10-07 DIAGNOSIS — I63412 Cerebral infarction due to embolism of left middle cerebral artery: Secondary | ICD-10-CM | POA: Diagnosis not present

## 2021-10-07 DIAGNOSIS — R4182 Altered mental status, unspecified: Secondary | ICD-10-CM | POA: Diagnosis not present

## 2021-10-07 DIAGNOSIS — Z888 Allergy status to other drugs, medicaments and biological substances status: Secondary | ICD-10-CM

## 2021-10-07 DIAGNOSIS — J811 Chronic pulmonary edema: Secondary | ICD-10-CM | POA: Diagnosis not present

## 2021-10-07 DIAGNOSIS — I771 Stricture of artery: Secondary | ICD-10-CM | POA: Diagnosis not present

## 2021-10-07 DIAGNOSIS — Z7189 Other specified counseling: Secondary | ICD-10-CM | POA: Diagnosis not present

## 2021-10-07 DIAGNOSIS — K6389 Other specified diseases of intestine: Secondary | ICD-10-CM | POA: Diagnosis not present

## 2021-10-07 DIAGNOSIS — Z811 Family history of alcohol abuse and dependence: Secondary | ICD-10-CM

## 2021-10-07 DIAGNOSIS — H5702 Anisocoria: Secondary | ICD-10-CM | POA: Diagnosis present

## 2021-10-07 DIAGNOSIS — Z96611 Presence of right artificial shoulder joint: Secondary | ICD-10-CM | POA: Diagnosis present

## 2021-10-07 DIAGNOSIS — J392 Other diseases of pharynx: Secondary | ICD-10-CM | POA: Diagnosis not present

## 2021-10-07 DIAGNOSIS — I700 Atherosclerosis of aorta: Secondary | ICD-10-CM | POA: Diagnosis not present

## 2021-10-07 DIAGNOSIS — E1165 Type 2 diabetes mellitus with hyperglycemia: Secondary | ICD-10-CM | POA: Diagnosis present

## 2021-10-07 DIAGNOSIS — E785 Hyperlipidemia, unspecified: Secondary | ICD-10-CM | POA: Diagnosis present

## 2021-10-07 LAB — POCT I-STAT 7, (LYTES, BLD GAS, ICA,H+H)
Acid-Base Excess: 1 mmol/L (ref 0.0–2.0)
Acid-base deficit: 12 mmol/L — ABNORMAL HIGH (ref 0.0–2.0)
Acid-base deficit: 11 mmol/L — ABNORMAL HIGH (ref 0.0–2.0)
Acid-base deficit: 5 mmol/L — ABNORMAL HIGH (ref 0.0–2.0)
Acid-base deficit: 2 mmol/L (ref 0.0–2.0)
Bicarbonate: 19.6 mmol/L — ABNORMAL LOW (ref 20.0–28.0)
Bicarbonate: 19.4 mmol/L — ABNORMAL LOW (ref 20.0–28.0)
Bicarbonate: 23.5 mmol/L (ref 20.0–28.0)
Bicarbonate: 24.9 mmol/L (ref 20.0–28.0)
Bicarbonate: 26.6 mmol/L (ref 20.0–28.0)
Calcium, Ion: 1.26 mmol/L (ref 1.15–1.40)
Calcium, Ion: 1.24 mmol/L (ref 1.15–1.40)
Calcium, Ion: 1.19 mmol/L (ref 1.15–1.40)
Calcium, Ion: 1.17 mmol/L (ref 1.15–1.40)
Calcium, Ion: 1.19 mmol/L (ref 1.15–1.40)
HCT: 29 % — ABNORMAL LOW (ref 36.0–46.0)
HCT: 26 % — ABNORMAL LOW (ref 36.0–46.0)
HCT: 25 % — ABNORMAL LOW (ref 36.0–46.0)
HCT: 23 % — ABNORMAL LOW (ref 36.0–46.0)
HCT: 21 % — ABNORMAL LOW (ref 36.0–46.0)
Hemoglobin: 9.9 g/dL — ABNORMAL LOW (ref 12.0–15.0)
Hemoglobin: 8.8 g/dL — ABNORMAL LOW (ref 12.0–15.0)
Hemoglobin: 8.5 g/dL — ABNORMAL LOW (ref 12.0–15.0)
Hemoglobin: 7.8 g/dL — ABNORMAL LOW (ref 12.0–15.0)
Hemoglobin: 7.1 g/dL — ABNORMAL LOW (ref 12.0–15.0)
O2 Saturation: 96 %
O2 Saturation: 91 %
O2 Saturation: 98 %
O2 Saturation: 97 %
O2 Saturation: 97 %
Patient temperature: 99.4
Patient temperature: 99.49
Patient temperature: 97.9
Patient temperature: 36.7
Patient temperature: 37
Potassium: 4.7 mmol/L (ref 3.5–5.1)
Potassium: 5 mmol/L (ref 3.5–5.1)
Potassium: 4.3 mmol/L (ref 3.5–5.1)
Potassium: 4 mmol/L (ref 3.5–5.1)
Potassium: 3.6 mmol/L (ref 3.5–5.1)
Sodium: 142 mmol/L (ref 135–145)
Sodium: 140 mmol/L (ref 135–145)
Sodium: 142 mmol/L (ref 135–145)
Sodium: 141 mmol/L (ref 135–145)
Sodium: 143 mmol/L (ref 135–145)
TCO2: 22 mmol/L (ref 22–32)
TCO2: 22 mmol/L (ref 22–32)
TCO2: 25 mmol/L (ref 22–32)
TCO2: 27 mmol/L (ref 22–32)
TCO2: 28 mmol/L (ref 22–32)
pCO2 arterial: 77.8 mmHg (ref 32–48)
pCO2 arterial: 73.6 mmHg (ref 32–48)
pCO2 arterial: 63.8 mmHg — ABNORMAL HIGH (ref 32–48)
pCO2 arterial: 52.7 mmHg — ABNORMAL HIGH (ref 32–48)
pCO2 arterial: 47.9 mmHg (ref 32–48)
pH, Arterial: 7.012 — CL (ref 7.35–7.45)
pH, Arterial: 7.032 — CL (ref 7.35–7.45)
pH, Arterial: 7.172 — CL (ref 7.35–7.45)
pH, Arterial: 7.281 — ABNORMAL LOW (ref 7.35–7.45)
pH, Arterial: 7.353 (ref 7.35–7.45)
pO2, Arterial: 123 mmHg — ABNORMAL HIGH (ref 83–108)
pO2, Arterial: 94 mmHg (ref 83–108)
pO2, Arterial: 123 mmHg — ABNORMAL HIGH (ref 83–108)
pO2, Arterial: 106 mmHg (ref 83–108)
pO2, Arterial: 94 mmHg (ref 83–108)

## 2021-10-07 LAB — COMPREHENSIVE METABOLIC PANEL
ALT: 47 U/L — ABNORMAL HIGH (ref 0–44)
ALT: 53 U/L — ABNORMAL HIGH (ref 0–44)
AST: 44 U/L — ABNORMAL HIGH (ref 15–41)
AST: 55 U/L — ABNORMAL HIGH (ref 15–41)
Albumin: 2.5 g/dL — ABNORMAL LOW (ref 3.5–5.0)
Albumin: 2 g/dL — ABNORMAL LOW (ref 3.5–5.0)
Alkaline Phosphatase: 241 U/L — ABNORMAL HIGH (ref 38–126)
Alkaline Phosphatase: 206 U/L — ABNORMAL HIGH (ref 38–126)
Anion gap: 13 (ref 5–15)
Anion gap: 10 (ref 5–15)
BUN: 30 mg/dL — ABNORMAL HIGH (ref 8–23)
BUN: 26 mg/dL — ABNORMAL HIGH (ref 8–23)
CO2: 19 mmol/L — ABNORMAL LOW (ref 22–32)
CO2: 23 mmol/L (ref 22–32)
Calcium: 9.1 mg/dL (ref 8.9–10.3)
Calcium: 8 mg/dL — ABNORMAL LOW (ref 8.9–10.3)
Chloride: 105 mmol/L (ref 98–111)
Chloride: 108 mmol/L (ref 98–111)
Creatinine, Ser: 1.62 mg/dL — ABNORMAL HIGH (ref 0.44–1.00)
Creatinine, Ser: 1.42 mg/dL — ABNORMAL HIGH (ref 0.44–1.00)
GFR, Estimated: 34 mL/min — ABNORMAL LOW (ref >60)
GFR, Estimated: 40 mL/min — ABNORMAL LOW (ref >60)
Glucose, Bld: 147 mg/dL — ABNORMAL HIGH (ref 70–99)
Glucose, Bld: 276 mg/dL — ABNORMAL HIGH (ref 70–99)
Potassium: 4.8 mmol/L (ref 3.5–5.1)
Potassium: 4.1 mmol/L (ref 3.5–5.1)
Sodium: 137 mmol/L (ref 135–145)
Sodium: 141 mmol/L (ref 135–145)
Total Bilirubin: 0.7 mg/dL (ref 0.3–1.2)
Total Bilirubin: 0.5 mg/dL (ref 0.3–1.2)
Total Protein: 7.1 g/dL (ref 6.5–8.1)
Total Protein: 6.3 g/dL — ABNORMAL LOW (ref 6.5–8.1)

## 2021-10-07 LAB — I-STAT VENOUS BLOOD GAS, ED
Acid-base deficit: 6 mmol/L — ABNORMAL HIGH (ref 0.0–2.0)
Bicarbonate: 22.6 mmol/L (ref 20.0–28.0)
Calcium, Ion: 1.22 mmol/L (ref 1.15–1.40)
HCT: 31 % — ABNORMAL LOW (ref 36.0–46.0)
Hemoglobin: 10.5 g/dL — ABNORMAL LOW (ref 12.0–15.0)
O2 Saturation: 98 %
Potassium: 4.7 mmol/L (ref 3.5–5.1)
Sodium: 138 mmol/L (ref 135–145)
TCO2: 24 mmol/L (ref 22–32)
pCO2, Ven: 56.4 mmHg (ref 44–60)
pH, Ven: 7.21 — ABNORMAL LOW (ref 7.25–7.43)
pO2, Ven: 123 mmHg — ABNORMAL HIGH (ref 32–45)

## 2021-10-07 LAB — CBC WITH DIFFERENTIAL/PLATELET
Abs Immature Granulocytes: 0.09 10*3/uL — ABNORMAL HIGH (ref 0.00–0.07)
Basophils Absolute: 0 10*3/uL (ref 0.0–0.1)
Basophils Relative: 0 %
Eosinophils Absolute: 0 10*3/uL (ref 0.0–0.5)
Eosinophils Relative: 0 %
HCT: 32.5 % — ABNORMAL LOW (ref 36.0–46.0)
Hemoglobin: 10 g/dL — ABNORMAL LOW (ref 12.0–15.0)
Immature Granulocytes: 0 %
Lymphocytes Relative: 6 %
Lymphs Abs: 1.1 10*3/uL (ref 0.7–4.0)
MCH: 27.6 pg (ref 26.0–34.0)
MCHC: 30.8 g/dL (ref 30.0–36.0)
MCV: 89.8 fL (ref 80.0–100.0)
Monocytes Absolute: 1.3 10*3/uL — ABNORMAL HIGH (ref 0.1–1.0)
Monocytes Relative: 6 %
Neutro Abs: 17.5 10*3/uL — ABNORMAL HIGH (ref 1.7–7.7)
Neutrophils Relative %: 88 %
Platelets: 450 10*3/uL — ABNORMAL HIGH (ref 150–400)
RBC: 3.62 MIL/uL — ABNORMAL LOW (ref 3.87–5.11)
RDW: 14.9 % (ref 11.5–15.5)
WBC: 20.1 10*3/uL — ABNORMAL HIGH (ref 4.0–10.5)
nRBC: 0 % (ref 0.0–0.2)

## 2021-10-07 LAB — RESP PANEL BY RT-PCR (FLU A&B, COVID) ARPGX2
Influenza A by PCR: NEGATIVE
Influenza B by PCR: NEGATIVE
SARS Coronavirus 2 by RT PCR: NEGATIVE

## 2021-10-07 LAB — I-STAT CHEM 8, ED
BUN: 31 mg/dL — ABNORMAL HIGH (ref 8–23)
Calcium, Ion: 1.23 mmol/L (ref 1.15–1.40)
Chloride: 107 mmol/L (ref 98–111)
Creatinine, Ser: 1.6 mg/dL — ABNORMAL HIGH (ref 0.44–1.00)
Glucose, Bld: 139 mg/dL — ABNORMAL HIGH (ref 70–99)
HCT: 31 % — ABNORMAL LOW (ref 36.0–46.0)
Hemoglobin: 10.5 g/dL — ABNORMAL LOW (ref 12.0–15.0)
Potassium: 4.7 mmol/L (ref 3.5–5.1)
Sodium: 138 mmol/L (ref 135–145)
TCO2: 23 mmol/L (ref 22–32)

## 2021-10-07 LAB — URINALYSIS, COMPLETE (UACMP) WITH MICROSCOPIC
Bacteria, UA: NONE SEEN
Bilirubin Urine: NEGATIVE
Glucose, UA: NEGATIVE mg/dL
Hgb urine dipstick: NEGATIVE
Ketones, ur: NEGATIVE mg/dL
Leukocytes,Ua: NEGATIVE
Nitrite: NEGATIVE
Protein, ur: NEGATIVE mg/dL
Specific Gravity, Urine: 1.036 — ABNORMAL HIGH (ref 1.005–1.030)
pH: 5 (ref 5.0–8.0)

## 2021-10-07 LAB — URINALYSIS, ROUTINE W REFLEX MICROSCOPIC
Bilirubin Urine: NEGATIVE
Glucose, UA: NEGATIVE mg/dL
Hgb urine dipstick: NEGATIVE
Ketones, ur: NEGATIVE mg/dL
Leukocytes,Ua: NEGATIVE
Nitrite: NEGATIVE
Protein, ur: NEGATIVE mg/dL
Specific Gravity, Urine: 1.018 (ref 1.005–1.030)
pH: 5 (ref 5.0–8.0)

## 2021-10-07 LAB — BRAIN NATRIURETIC PEPTIDE: B Natriuretic Peptide: 26.9 pg/mL (ref 0.0–100.0)

## 2021-10-07 LAB — LACTIC ACID, PLASMA
Lactic Acid, Venous: 2.8 mmol/L (ref 0.5–1.9)
Lactic Acid, Venous: 2.7 mmol/L (ref 0.5–1.9)
Lactic Acid, Venous: 3.4 mmol/L (ref 0.5–1.9)

## 2021-10-07 LAB — GLUCOSE, CAPILLARY
Glucose-Capillary: 209 mg/dL — ABNORMAL HIGH (ref 70–99)
Glucose-Capillary: 192 mg/dL — ABNORMAL HIGH (ref 70–99)
Glucose-Capillary: 219 mg/dL — ABNORMAL HIGH (ref 70–99)

## 2021-10-07 LAB — PROTIME-INR
INR: 1.1 (ref 0.8–1.2)
Prothrombin Time: 14 seconds (ref 11.4–15.2)

## 2021-10-07 LAB — APTT: aPTT: 27 seconds (ref 24–36)

## 2021-10-07 LAB — MRSA NEXT GEN BY PCR, NASAL: MRSA by PCR Next Gen: NOT DETECTED

## 2021-10-07 MED ORDER — LACTATED RINGERS IV BOLUS
500.0000 mL | Freq: Once | INTRAVENOUS | Status: AC
  Administered 2021-10-07: 500 mL via INTRAVENOUS

## 2021-10-07 MED ORDER — ROCURONIUM BROMIDE 50 MG/5ML IV SOLN
80.0000 mg | Freq: Once | INTRAVENOUS | Status: AC
  Administered 2021-10-07: 80 mg via INTRAVENOUS
  Filled 2021-10-07: qty 8

## 2021-10-07 MED ORDER — DEXTROSE 5 % IV SOLN
INTRAVENOUS | Status: DC
Start: 2021-10-07 — End: 2021-10-07
  Filled 2021-10-07: qty 1000

## 2021-10-07 MED ORDER — FENTANYL CITRATE PF 50 MCG/ML IJ SOSY
50.0000 ug | PREFILLED_SYRINGE | Freq: Once | INTRAMUSCULAR | Status: AC
  Administered 2021-10-07: 50 ug via INTRAVENOUS
  Filled 2021-10-07: qty 1

## 2021-10-07 MED ORDER — DOCUSATE SODIUM 50 MG/5ML PO LIQD
100.0000 mg | Freq: Two times a day (BID) | ORAL | Status: DC
  Administered 2021-10-07 – 2021-10-10 (×6): 100 mg
  Filled 2021-10-07 (×7): qty 10

## 2021-10-07 MED ORDER — SODIUM CHLORIDE 0.9 % IV SOLN: 1000.0000 mL | INTRAVENOUS | Status: DC

## 2021-10-07 MED ORDER — PANTOPRAZOLE 2 MG/ML SUSPENSION
40.0000 mg | Freq: Every day | ORAL | Status: DC
  Administered 2021-10-07 – 2021-10-10 (×4): 40 mg
  Filled 2021-10-07 (×5): qty 20

## 2021-10-07 MED ORDER — IPRATROPIUM-ALBUTEROL 0.5-2.5 (3) MG/3ML IN SOLN
3.0000 mL | RESPIRATORY_TRACT | Status: DC
  Administered 2021-10-07 – 2021-10-11 (×27): 3 mL via RESPIRATORY_TRACT
  Filled 2021-10-07 (×27): qty 3

## 2021-10-07 MED ORDER — NICOTINE 14 MG/24HR TD PT24
14.0000 mg | MEDICATED_PATCH | Freq: Every day | TRANSDERMAL | Status: DC
  Administered 2021-10-07 – 2021-10-10 (×4): 14 mg via TRANSDERMAL
  Filled 2021-10-07 (×5): qty 1

## 2021-10-07 MED ORDER — PROPOFOL 1000 MG/100ML IV EMUL
0.0000 ug/kg/min | INTRAVENOUS | Status: DC
  Administered 2021-10-07: 50 ug/kg/min via INTRAVENOUS
  Administered 2021-10-07: 5 ug/kg/min via INTRAVENOUS
  Administered 2021-10-08: 40 ug/kg/min via INTRAVENOUS
  Administered 2021-10-08: 35 ug/kg/min via INTRAVENOUS
  Administered 2021-10-08: 30 ug/kg/min via INTRAVENOUS
  Administered 2021-10-08: 50 ug/kg/min via INTRAVENOUS
  Administered 2021-10-08: 30 ug/kg/min via INTRAVENOUS
  Administered 2021-10-09: 40 ug/kg/min via INTRAVENOUS
  Filled 2021-10-07: qty 100
  Filled 2021-10-07: qty 200
  Filled 2021-10-07 (×4): qty 100
  Filled 2021-10-07: qty 200
  Filled 2021-10-07: qty 100

## 2021-10-07 MED ORDER — SODIUM CHLORIDE 0.9 % IV SOLN
250.0000 mL | INTRAVENOUS | Status: DC
Start: 2021-10-07 — End: 2021-10-11

## 2021-10-07 MED ORDER — SODIUM CHLORIDE 0.9 % IV SOLN: INTRAVENOUS | Status: DC | PRN

## 2021-10-07 MED ORDER — PIPERACILLIN-TAZOBACTAM 3.375 G IVPB
3.3750 g | Freq: Three times a day (TID) | INTRAVENOUS | Status: DC
  Administered 2021-10-07 – 2021-10-11 (×12): 3.375 g via INTRAVENOUS
  Filled 2021-10-07 (×13): qty 50

## 2021-10-07 MED ORDER — GERHARDT'S BUTT CREAM
TOPICAL_CREAM | Freq: Two times a day (BID) | CUTANEOUS | Status: DC
  Administered 2021-10-07 – 2021-10-10 (×4): 1 via TOPICAL
  Filled 2021-10-07: qty 1

## 2021-10-07 MED ORDER — FENTANYL 2500MCG IN NS 250ML (10MCG/ML) PREMIX INFUSION
0.0000 ug/h | INTRAVENOUS | Status: DC
  Administered 2021-10-07: 50 ug/h via INTRAVENOUS
  Administered 2021-10-08: 75 ug/h via INTRAVENOUS
  Administered 2021-10-09: 125 ug/h via INTRAVENOUS
  Filled 2021-10-07 (×3): qty 250

## 2021-10-07 MED ORDER — ENOXAPARIN SODIUM 30 MG/0.3ML IJ SOSY
30.0000 mg | PREFILLED_SYRINGE | Freq: Every day | INTRAMUSCULAR | Status: DC
  Administered 2021-10-07 – 2021-10-08 (×2): 30 mg via SUBCUTANEOUS
  Filled 2021-10-07 (×2): qty 0.3

## 2021-10-07 MED ORDER — DOCUSATE SODIUM 100 MG PO CAPS: 100.0000 mg | ORAL_CAPSULE | Freq: Two times a day (BID) | ORAL | Status: DC | PRN

## 2021-10-07 MED ORDER — POLYETHYLENE GLYCOL 3350 17 G PO PACK: 17.0000 g | PACK | Freq: Every day | ORAL | Status: DC | PRN

## 2021-10-07 MED ORDER — ORAL CARE MOUTH RINSE
15.0000 mL | OROMUCOSAL | Status: DC
  Administered 2021-10-07 – 2021-10-11 (×30): 15 mL via OROMUCOSAL

## 2021-10-07 MED ORDER — ARFORMOTEROL TARTRATE 15 MCG/2ML IN NEBU
15.0000 ug | INHALATION_SOLUTION | Freq: Two times a day (BID) | RESPIRATORY_TRACT | Status: DC
  Administered 2021-10-07 – 2021-10-11 (×9): 15 ug via RESPIRATORY_TRACT
  Filled 2021-10-07 (×9): qty 2

## 2021-10-07 MED ORDER — INSULIN ASPART 100 UNIT/ML IJ SOLN
0.0000 [IU] | INTRAMUSCULAR | Status: DC
  Administered 2021-10-07: 5 [IU] via SUBCUTANEOUS
  Administered 2021-10-07: 3 [IU] via SUBCUTANEOUS
  Administered 2021-10-07: 5 [IU] via SUBCUTANEOUS
  Administered 2021-10-07: 3 [IU] via SUBCUTANEOUS
  Administered 2021-10-08 – 2021-10-09 (×5): 2 [IU] via SUBCUTANEOUS
  Administered 2021-10-10: 3 [IU] via SUBCUTANEOUS
  Administered 2021-10-10: 2 [IU] via SUBCUTANEOUS
  Administered 2021-10-10: 3 [IU] via SUBCUTANEOUS
  Administered 2021-10-10: 2 [IU] via SUBCUTANEOUS

## 2021-10-07 MED ORDER — FENTANYL CITRATE (PF) 100 MCG/2ML IJ SOLN
100.0000 ug | Freq: Once | INTRAMUSCULAR | Status: AC
  Administered 2021-10-07: 100 ug via INTRAVENOUS

## 2021-10-07 MED ORDER — SODIUM BICARBONATE 8.4 % IV SOLN
100.0000 meq | Freq: Once | INTRAVENOUS | Status: AC
  Administered 2021-10-07: 100 meq via INTRAVENOUS
  Filled 2021-10-07: qty 50

## 2021-10-07 MED ORDER — FAMOTIDINE 40 MG/5ML PO SUSR: 20.0000 mg | Freq: Two times a day (BID) | ORAL | Status: DC

## 2021-10-07 MED ORDER — NOREPINEPHRINE 4 MG/250ML-% IV SOLN
2.0000 ug/min | INTRAVENOUS | Status: DC
  Administered 2021-10-07: 2 ug/min via INTRAVENOUS
  Administered 2021-10-08: 4 ug/min via INTRAVENOUS
  Administered 2021-10-08: 17:00:00 3 ug/min via INTRAVENOUS
  Administered 2021-10-08: 2 ug/min via INTRAVENOUS
  Administered 2021-10-09: 11:00:00 3 ug/min via INTRAVENOUS
  Filled 2021-10-07 (×4): qty 250

## 2021-10-07 MED ORDER — ETOMIDATE 2 MG/ML IV SOLN
20.0000 mg | Freq: Once | INTRAVENOUS | Status: AC
  Administered 2021-10-07: 20 mg via INTRAVENOUS

## 2021-10-07 MED ORDER — POLYETHYLENE GLYCOL 3350 17 G PO PACK
17.0000 g | PACK | Freq: Every day | ORAL | Status: DC
  Administered 2021-10-08 – 2021-10-10 (×3): 17 g
  Filled 2021-10-07 (×3): qty 1

## 2021-10-07 MED ORDER — SODIUM CHLORIDE 0.9 % IV BOLUS (SEPSIS)
1000.0000 mL | Freq: Once | INTRAVENOUS | Status: AC
Start: 2021-10-07 — End: 2021-10-07
  Administered 2021-10-07: 1000 mL via INTRAVENOUS

## 2021-10-07 MED ORDER — CHLORHEXIDINE GLUCONATE 0.12% ORAL RINSE (MEDLINE KIT)
15.0000 mL | Freq: Two times a day (BID) | OROMUCOSAL | Status: DC
  Administered 2021-10-07 – 2021-10-10 (×8): 15 mL via OROMUCOSAL

## 2021-10-07 MED ORDER — VANCOMYCIN HCL 1250 MG/250ML IV SOLN
1250.0000 mg | Freq: Once | INTRAVENOUS | Status: AC
  Administered 2021-10-07: 1250 mg via INTRAVENOUS
  Filled 2021-10-07: qty 250

## 2021-10-07 MED ORDER — SODIUM CHLORIDE 0.9 % IV BOLUS (SEPSIS)
1000.0000 mL | Freq: Once | INTRAVENOUS | Status: AC
  Administered 2021-10-07: 1000 mL via INTRAVENOUS

## 2021-10-07 MED ORDER — PIPERACILLIN-TAZOBACTAM 3.375 G IVPB 30 MIN
3.3750 g | Freq: Once | INTRAVENOUS | Status: AC
  Administered 2021-10-07: 3.375 g via INTRAVENOUS
  Filled 2021-10-07: qty 50

## 2021-10-07 MED ORDER — IOHEXOL 300 MG/ML  SOLN
75.0000 mL | Freq: Once | INTRAMUSCULAR | Status: AC | PRN
  Administered 2021-10-07: 75 mL via INTRAVENOUS

## 2021-10-07 MED ORDER — CHLORHEXIDINE GLUCONATE CLOTH 2 % EX PADS
6.0000 | MEDICATED_PAD | Freq: Every day | CUTANEOUS | Status: DC
  Administered 2021-10-07 – 2021-10-08 (×2): 6 via TOPICAL

## 2021-10-07 MED ORDER — FENTANYL CITRATE (PF) 100 MCG/2ML IJ SOLN: 25.0000 ug | INTRAMUSCULAR | Status: DC | PRN

## 2021-10-07 MED ORDER — FENTANYL CITRATE PF 50 MCG/ML IJ SOSY
25.0000 ug | PREFILLED_SYRINGE | INTRAMUSCULAR | Status: DC | PRN
  Administered 2021-10-07: 100 ug via INTRAVENOUS
  Filled 2021-10-07 (×2): qty 2

## 2021-10-07 MED ORDER — METHYLPREDNISOLONE SODIUM SUCC 125 MG IJ SOLR
125.0000 mg | Freq: Once | INTRAMUSCULAR | Status: AC
  Administered 2021-10-07: 125 mg via INTRAVENOUS
  Filled 2021-10-07: qty 2

## 2021-10-07 MED ORDER — BUDESONIDE 0.25 MG/2ML IN SUSP
0.2500 mg | Freq: Two times a day (BID) | RESPIRATORY_TRACT | Status: DC
  Administered 2021-10-07 – 2021-10-11 (×9): 0.25 mg via RESPIRATORY_TRACT
  Filled 2021-10-07 (×9): qty 2

## 2021-10-07 MED ORDER — FENTANYL CITRATE PF 50 MCG/ML IJ SOSY
75.0000 ug | PREFILLED_SYRINGE | Freq: Once | INTRAMUSCULAR | Status: AC
  Administered 2021-10-07: 75 ug via INTRAVENOUS

## 2021-10-07 MED ORDER — SODIUM BICARBONATE 8.4 % IV SOLN
INTRAVENOUS | Status: AC
  Administered 2021-10-07: 100 meq via INTRAVENOUS
  Filled 2021-10-07: qty 50

## 2021-10-07 MED ORDER — SODIUM CHLORIDE 0.9 % IV SOLN: INTRAVENOUS | Status: DC

## 2021-10-07 MED ORDER — FENTANYL CITRATE PF 50 MCG/ML IJ SOSY: 25.0000 ug | PREFILLED_SYRINGE | INTRAMUSCULAR | Status: DC | PRN

## 2021-10-07 NOTE — Procedures (Signed)
Arterial Catheter Insertion Procedure Note  Sheryl [name]  [id number]  [birth year]  Date:10/07/21  Time:4:28 PM    Provider Performing: Maryjane Hurter    Procedure: Insertion of Arterial Line [phone]) with US guidance ([id number])   Indication(s) Blood pressure monitoring and/or need for frequent ABGs  Consent Risks of the procedure as well as the alternatives and risks of each were explained to the patient and/or caregiver.  Consent for the procedure was obtained and is signed in the bedside chart  Anesthesia 1% lidocaine   Time Out Verified patient identification, verified procedure, site/side was marked, verified correct patient position, special equipment/implants available, medications/allergies/relevant history reviewed, required imaging and test results available.   Sterile Technique Maximal sterile technique including full sterile barrier drape, hand hygiene, sterile gown, sterile gloves, mask, hair covering, sterile ultrasound probe cover (if used).   Procedure Description Area of catheter insertion was cleaned with chlorhexidine and draped in sterile fashion. With real-time ultrasound guidance an arterial catheter was attempted in the left and right radial arteries, unsuccessfully.     Complications/Tolerance Small hematoma formation at puncture sites.   EBL Minimal   Specimen(s) None

## 2021-10-07 NOTE — Progress Notes (Signed)
Pt transported to and from CT scan on the ventilaor without incident.

## 2021-10-07 NOTE — ED Notes (Signed)
Pt is tachypneic, dyspnea at rest, using accessory muscles, increased work of breathing. I had secretary page Dr Lorin Mercy and I notified respiratory

## 2021-10-07 NOTE — Procedures (Signed)
Intubation Procedure Note  Sheryl [name]  [id number]  02-Oct-1950  Date:10/07/21  Time:11:08 AM   Provider Performing:Katlyne Nishida M [name]    Procedure: Intubation (31500)  Indication(s) Respiratory Failure  Consent Risks of the procedure as well as the alternatives and risks of each were explained to the patient and/or caregiver.  Consent for the procedure was obtained and is signed in the bedside chart   Anesthesia Etomidate and Rocuronium   Time Out Verified patient identification, verified procedure, site/side was marked, verified correct patient position, special equipment/implants available, medications/allergies/relevant history reviewed, required imaging and test results available.   Sterile Technique Usual hand hygeine, masks, and gloves were used   Procedure Description Patient positioned in bed supine.  Sedation given as noted above.  Bad dentition, no change with intubation. Patient was intubated with endotracheal tube using Glidescope s3.  View was Grade 1 full glottis .  Number of attempts was 1.  Colorimetric CO2 detector was consistent with tracheal placement.   Complications/Tolerance None; patient tolerated the procedure well. Chest X-ray is ordered to verify placement.   EBL Minimal   Specimen(s) None

## 2021-10-07 NOTE — H&P (Signed)
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 She was started on DAPT with plan for TCAR with vascular surgery in a few weeks, discharged to [facility] 2/13. She developed gradually worsening dyspnea over last several days and was started on levaquin in her facility with concern for pneumonia- pt and family believe it is due to aspiration.   Also developed rash under her jaw and some swelling - there was potentially concern for allergic reaction to antibiotic at facility.   In ED she was given vanc, zosyn and sepsis bolus. She had increased work of breathing on NRB, given duonebs, solumedrol, and trialed on [facility] but ultimately intubated on arrival to ICU.   Pertinent  Medical History  COPD  Chronic hypoxemic respiratory failure on 2L [place] L MCA CVA ICA stenosis Smoking  Significant Hospital Events: Including procedures, antibiotic start and stop dates in addition to other pertinent events   10/07/21 admitted to ICU, intubation, arterial line   Interim History / Subjective:    Objective   Blood pressure (!) 102/53, pulse (!) 108, temperature 98.6 F (37 C), temperature source Rectal, resp. rate (!) 26, height 5\' 1"  (1.549 m), weight 64 kg, SpO2 96 %.       No intake or output data in the 24 hours ending 10/07/21 0945 Filed Weights   10/07/21 0319  Weight: 64 kg    Examination: General appearance: 71 y.o., female, female, distressed Eyes: PERRL, tracking appropriately HENT: NCAT; very dry MM Neck: Trachea midline; no lymphadenopathy, no JVD, some  edema/erythema under left jaw Lungs: coarse and wheezy, with increased respiratory effort CV: RRR, no murmur  Abdomen: Soft, non-tender; non-distended, BS present  Extremities: No peripheral edema, warm Neuro: Alert and oriented to situation, weaker on right   Resolved Hospital Problem list     Assessment & Plan:  # Acute on chronic hypoxic hypercapnic respiratory failure - abg q4 - ltvv liberalize to 8cc/kg in setting severe acidosis - fentanyl and propofol for rass -1 to -2 - bronchodilators - prednisone 40 mg daily for ~5 days - tracheal aspirate, blood cultures, zosyn - follow cultures and narrow as able  # Sepsis due to aspiration pneumonia and possible deep space neck infection: She was a little tender under left jaw but freely able to move head side to side and up and down without meningismus prior to intubation - seems possible this is odontogenic with poor dentition? - zosyn as above, follow cultures and narrow as able - edema/erythema under jaw marked out, will follow clinically, if worsening or area of induration concerning for drainable collection will reach out to ENT   # Severe metabolic and respiratory acidosis  - trend ABG till pH >7.2, recheck BMP - bicarb 2 amps and start  infusion at 125/hr  # Hypotension: Likely sedation related vs resolving septic//hypovolemic shock.  # AKI: - f/u response to initial resuscitative measures   Best Practice (right click and "Reselect all SmartList Selections" daily)   Diet/type: NPO w/ meds via tube DVT prophylaxis: LMWH GI prophylaxis: H2B Lines: N/A Foley:  N/A Code  Status:  full code Last date of multidisciplinary goals of care discussion [updated family at bedside today.]  Labs   CBC: Recent Labs  Lab 10/07/21 0340 10/07/21 0442  WBC 20.1*  --   NEUTROABS 17.5*  --   HGB 10.0* 10.5*   10.5*  HCT 32.5* 31.0*   31.0*  MCV 89.8  --   PLT 450*  --     Basic Metabolic Panel: Recent Labs  Lab  09/30/21 1542 10/07/21 0340 10/07/21 0442  NA 144 137 138   138  K 3.7 4.8 4.7   4.7  CL 110 105 107  CO2 24 19*  --   GLUCOSE 151* 147* 139*  BUN 26* 30* 31*  CREATININE 1.19* 1.62* 1.60*  CALCIUM 8.9 9.1  --    GFR: Estimated Creatinine Clearance: 27.6 mL/min (A) (by C-G formula based on SCr of 1.6 mg/dL (H)). Recent Labs  Lab 10/07/21 0340 10/07/21 0608  WBC 20.1*  --   LATICACIDVEN 2.8* 2.7*    Liver Function Tests: Recent Labs  Lab 10/07/21 0340  AST 44*  ALT 47*  ALKPHOS 241*  BILITOT 0.7  PROT 7.1  ALBUMIN 2.5*   No results for input(s): LIPASE, AMYLASE in the last 168 hours. No results for input(s): AMMONIA in the last 168 hours.  ABG    Component Value Date/Time   HCO3 22.6 10/07/2021 0442   TCO2 23 10/07/2021 0442   TCO2 24 10/07/2021 0442   ACIDBASEDEF 6.0 (H) 10/07/2021 0442   O2SAT 98 10/07/2021 0442     Coagulation Profile: Recent Labs  Lab 10/07/21 0340  INR 1.1    Cardiac Enzymes: No results for input(s): CKTOTAL, CKMB, CKMBINDEX, TROPONINI in the last 168 hours.  HbA1C: Hgb A1c MFr Bld  Date/Time Value Ref Range Status  09/27/2021 08:30 PM 5.5 4.8 - 5.6 % Final    Comment:    (NOTE) Pre diabetes:          5.7%-6.4%  Diabetes:              >6.4%  Glycemic control for   <7.0% adults with diabetes   04/18/2021 01:00 PM 6.0 4.6 - 6.5 % Final    Comment:    Glycemic Control Guidelines for People with Diabetes:Non Diabetic:  <6%Goal of Therapy: <7%Additional Action Suggested:  >8%     CBG: No results for input(s): GLUCAP in the last 168 hours.  Review of Systems:   Unable to obtain in setting of her increased work of breathing  Past Medical History:  She,  has a past medical history of ADD (attention deficit disorder), Allergy, Arthritis, CKD (chronic kidney disease), stage III ([place]), COPD (chronic obstructive pulmonary disease) ([place]) (12/09/2014), Depression, Does use hearing aid, Dyspnea, Hyperlipidemia, Hypertension, LGSIL  (low grade squamous intraepithelial dysplasia), Osteoporosis, and Pre-diabetes.   Surgical History:   Past Surgical History:  Procedure Laterality Date   JOINT REPLACEMENT     right knee   MULTIPLE TOOTH EXTRACTIONS     REPLACEMENT TOTAL KNEE  2013   right   REVERSE SHOULDER ARTHROPLASTY Right 03/30/2019   Procedure: REVERSE TOTAL SHOULDER ARTHROPLASTY;  Surgeon: Netta Cedars, MD;  Location: [facility];  Service: Orthopedics;  Laterality: Right;  interscalene block   TOTAL KNEE ARTHROPLASTY Left 12/27/2017   TOTAL KNEE ARTHROPLASTY Left 12/27/2017   Procedure: LEFT TOTAL KNEE ARTHROPLASTY;  Surgeon: Netta Cedars, MD;  Location: [place];  Service: Orthopedics;  Laterality: Left;     Social History:   reports  that she has been smoking cigarettes. She has a 12.25 pack-year smoking history. She has never used smokeless tobacco. She reports that she does not drink alcohol and does not use drugs.   Family History:  Her family history includes Alcohol abuse in her father and maternal uncle; Arthritis in her father; Colon polyps in her father; Hyperlipidemia in her mother; Stroke in her maternal grandmother.   Allergies Allergies  Allergen Reactions   Macrobid [Nitrofurantoin Monohyd Macro] Nausea And Vomiting    GI upset     Home Medications  Prior to Admission medications   Medication Sig Start Date End Date Taking? Authorizing Provider  albuterol (VENTOLIN HFA) 108 (90 Base) MCG/ACT inhaler Inhale 1-2 puffs into the lungs every 4 (four) hours as needed for wheezing or shortness of breath. 05/10/20  Yes Burns, Claudina Lick, MD  amLODipine (NORVASC) 5 MG tablet TAKE 1 TABLET BY MOUTH EVERY DAY Patient taking differently: Take 5 mg by mouth daily. 05/04/21  Yes Burns, Claudina Lick, MD  aspirin EC 325 MG EC tablet Take 1 tablet (325 mg total) by mouth daily. 10/02/21  Yes Hosie Poisson, MD  atomoxetine (STRATTERA) 100 MG capsule TAKE 1 CAPSULE BY MOUTH EVERY DAY Patient taking differently: Take 100 mg by  mouth daily. 08/07/21  Yes Burns, Claudina Lick, MD  Budeson-Glycopyrrol-Formoterol (BREZTRI AEROSPHERE) 160-9-4.8 MCG/ACT AERO Inhale 2 puffs into the lungs 2 (two) times daily. 05/30/21  Yes Burns, Claudina Lick, MD  clopidogrel (PLAVIX) 75 MG tablet Take 1 tablet (75 mg total) by mouth daily. 10/02/21  Yes Hosie Poisson, MD  cyclobenzaprine (FLEXERIL) 10 MG tablet Take 0.5-1 tablets (5-10 mg total) by mouth 3 (three) times daily as needed for muscle spasms. Patient taking differently: Take 5 mg by mouth 3 (three) times daily as needed for muscle spasms. 04/17/21  Yes Burns, Claudina Lick, MD  FLUoxetine (PROZAC) 20 MG capsule TAKE 1 CAPSULE (20 MG TOTAL) BY MOUTH DAILY IN ADDITION TO 40 MG FOR A TOTAL OF 60 MG. Patient taking differently: Take 60 mg by mouth daily. 04/17/21  Yes Burns, Claudina Lick, MD  fluticasone (FLONASE) 50 MCG/ACT nasal spray Place 1 spray into both nostrils daily as needed for allergies.   Yes [provider]  levofloxacin (LEVAQUIN) 750 MG tablet Take 750 mg by mouth See admin instructions. Every 48 hours x 4 doses for pneumonia   Yes [provider]  losartan (COZAAR) 100 MG tablet TAKE 1 TABLET BY MOUTH EVERY DAY Patient taking differently: Take 100 mg by mouth daily. 09/11/21  Yes Burns, Claudina Lick, MD  meclizine (ANTIVERT) 25 MG tablet Take 1 tablet (25 mg total) by mouth 3 (three) times daily as needed for dizziness. 09/28/19  Yes Burns, Claudina Lick, MD  nicotine (NICODERM CQ - DOSED IN MG/24 HOURS) 14 mg/24hr patch Place 1 patch (14 mg total) onto the skin daily. 10/02/21  Yes Hosie Poisson, MD  omeprazole (PRILOSEC) 20 MG capsule TAKE 1 CAPSULE BY MOUTH EVERY DAY Patient taking differently: Take 20 mg by mouth daily. 06/21/21  Yes Burns, Claudina Lick, MD  OXYGEN Inhale 2 L into the lungs as needed (to keep 02 sats above 90%).   Yes [provider]  rOPINIRole (REQUIP) 0.25 MG tablet TAKE 1 TABLET BY MOUTH AT BEDTIME. Patient taking differently: Take 0.25 mg by mouth at bedtime.  06/12/21  Yes Burns, Claudina Lick, MD  rosuvastatin (CRESTOR) 20 MG tablet Take 1 tablet (20 mg total) by mouth daily. 04/17/21  Yes Burns, Claudina Lick,  MD  traZODone (DESYREL) 50 MG tablet TAKE 1 TABLET BY MOUTH EVERYDAY AT BEDTIME Patient taking differently: Take 50 mg by mouth at bedtime. 09/14/21  Yes Burns, Claudina Lick, MD  FLUoxetine (PROZAC) 40 MG capsule TAKE 1 CAPSULE BY MOUTH DAILY. IN ADDITION TO 20 MG FOR TOTAL OF 60 MG DAILY Patient not taking: Reported on 10/07/2021 09/14/21   Binnie Rail, MD  food thickener (SIMPLYTHICK, NECTAR/LEVEL 2/MILDLY THICK,) GEL Take 10 packets by mouth as needed. 10/02/21   Hosie Poisson, MD  promethazine (PHENERGAN) 25 MG/ML injection Inject 12.5 mg into the vein once.    [provider]     Critical care time: 38 minutes

## 2021-10-07 NOTE — ED Triage Notes (Signed)
Arrives EMS from [facility] facility with shob worsening today. Hx COPD wears 2L [place] PRN but last several days has required 4L all the time. Seen recently and dx with CVA with some remaining right sided paralysis.

## 2021-10-07 NOTE — ED Provider Notes (Signed)
[facility] EMERGENCY DEPARTMENT Provider Note  CSN: [id number] Arrival date & time: 10/07/21 0304  Chief Complaint(s) Shortness of Breath  HPI Sheryl [name] is a 71 y.o. female with a past medical history listed below including COPD on 2 L nasal cannula who was recently admitted and discharged from the hospital last week after suffering a left MCA stroke with right-sided residual deficits and dysarthria who presents from skilled nursing facility for increased work of breathing and tachycardia in the setting of newly diagnosed pneumonia.  Patient reports that the shortness of breath was gradual onset over the past several days.  Patient is having significant shortness of breath while lying down.  She required increase in her baseline oxygen from 2 L to 4 L..  She has a deep cough.  Patient denies any associated chest pain.  No nausea or vomiting.  The history is provided by the patient.   Past Medical History Past Medical History:  Diagnosis Date   ADD (attention deficit disorder)    Allergy    Arthritis    CKD (chronic kidney disease), stage III ([place])    COPD (chronic obstructive pulmonary disease) ([employer]) 12/09/2014   Depression    Does use hearing aid    Dyspnea    on exertion   Hyperlipidemia    Hypertension    LGSIL (low grade squamous intraepithelial dysplasia)    Osteoporosis    Pre-diabetes    Patient Active Problem List   Diagnosis Date Noted   Sepsis ([place]) 10/07/2021   Hypernatremia 09/30/2021   Acute ischemic left MCA stroke ([place]) 09/26/2021   Rhabdomyolysis 09/26/2021   Right groin pain 04/17/2021   Tobacco abuse 12/14/2020   Restless leg syndrome 12/14/2020   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   Osteoporosis 05/21/2020   Aortic atherosclerosis ([place]) 05/09/2020   GERD (gastroesophageal reflux disease) 04/12/2020   Anemia 04/01/2019   S/P shoulder replacement, 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   Vertigo 06/05/2018   Status post total knee replacement, 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    Stage 3a chronic kidney disease (CKD) ([place]) - baseline SCr 1.1-1.3 12/27/2016   Insomnia 06/29/2016   Prediabetes 06/29/2016   LGSIL (low grade squamous intraepithelial dysplasia) 12/08/2015   COPD (chronic obstructive pulmonary disease) ([place]) 12/09/2014   COPD exacerbation ([place]) 11/02/2014   Lumbago 07/16/2014   Bilateral hand pain 07/16/2014   HTN (hypertension) 06/04/2012   Hyperlipidemia 06/04/2012   Depression 06/04/2012   ADD (attention deficit disorder) 06/04/2012   AR (allergic rhinitis) 06/04/2012   Home Medication(s) Prior to Admission medications   Medication Sig Start Date End Date Taking? Authorizing Provider  albuterol (VENTOLIN HFA) 108 (90 Base) MCG/ACT inhaler Inhale 1-2 puffs into the lungs every 4 (four) hours as needed for wheezing or shortness of breath. 05/10/20  Yes Burns, Claudina Lick, MD  amLODipine (NORVASC) 5 MG tablet TAKE 1 TABLET BY MOUTH EVERY DAY Patient taking differently: Take 5 mg by mouth daily. 05/04/21  Yes Burns, Claudina Lick, MD  aspirin EC 325 MG EC tablet Take 1 tablet (325 mg total) by mouth daily. 10/02/21  Yes Hosie Poisson, MD  atomoxetine (STRATTERA) 100 MG capsule TAKE 1 CAPSULE BY MOUTH EVERY DAY Patient taking differently: Take 100 mg by mouth daily. 08/07/21  Yes Burns, Claudina Lick, MD  Budeson-Glycopyrrol-Formoterol (BREZTRI AEROSPHERE) 160-9-4.8 MCG/ACT AERO Inhale 2 puffs into the lungs 2 (two) times daily. 05/30/21  Yes Burns, Claudina Lick, MD  clopidogrel (PLAVIX) 75 MG tablet Take 1 tablet (75 mg total) by mouth daily. 10/02/21  Yes  Hosie Poisson, MD  cyclobenzaprine (FLEXERIL) 10 MG tablet Take 0.5-1 tablets (5-10 mg total) by mouth 3 (three) times daily as needed for muscle spasms. Patient taking differently: Take 5 mg by mouth 3 (three) times daily as needed for muscle spasms. 04/17/21  Yes Burns, Claudina Lick, MD  FLUoxetine (PROZAC) 20 MG capsule TAKE 1 CAPSULE (20 MG TOTAL) BY MOUTH DAILY IN ADDITION TO 40 MG FOR A TOTAL OF 60 MG. Patient taking  differently: Take 20 mg by mouth daily. Along with 40 mg to equal 60 mg 04/17/21  Yes Burns, Claudina Lick, MD  FLUoxetine (PROZAC) 40 MG capsule TAKE 1 CAPSULE BY MOUTH DAILY. IN ADDITION TO 20 MG FOR TOTAL OF 60 MG DAILY Patient taking differently: Take 40 mg by mouth daily. Along with 20 mg to equal 60 mg 09/14/21  Yes Burns, Claudina Lick, MD  fluticasone [facility]) 50 MCG/ACT nasal spray Place 1 spray into both nostrils daily as needed for allergies.   Yes [provider]  levofloxacin (LEVAQUIN) 750 MG tablet Take 750 mg by mouth See admin instructions. Every 48 hours x 4 doses for pneumonia   Yes [provider]  losartan (COZAAR) 100 MG tablet TAKE 1 TABLET BY MOUTH EVERY DAY Patient taking differently: Take 100 mg by mouth daily. 09/11/21  Yes Burns, Claudina Lick, MD  meclizine (ANTIVERT) 25 MG tablet Take 1 tablet (25 mg total) by mouth 3 (three) times daily as needed for dizziness. 09/28/19  Yes Burns, Claudina Lick, MD  nicotine (NICODERM CQ - DOSED IN MG/24 HOURS) 14 mg/24hr patch Place 1 patch (14 mg total) onto the skin daily. 10/02/21  Yes Hosie Poisson, MD  omeprazole (PRILOSEC) 20 MG capsule TAKE 1 CAPSULE BY MOUTH EVERY DAY Patient taking differently: Take 20 mg by mouth daily. 06/21/21  Yes Burns, Claudina Lick, MD  OXYGEN Inhale 2 L into the lungs as needed (to keep 02 sats above 90%).   Yes [provider]  rOPINIRole (REQUIP) 0.25 MG tablet TAKE 1 TABLET BY MOUTH AT BEDTIME. Patient taking differently: Take 0.25 mg by mouth at bedtime. 06/12/21  Yes Burns, Claudina Lick, MD  rosuvastatin (CRESTOR) 20 MG tablet Take 1 tablet (20 mg total) by mouth daily. 04/17/21  Yes Burns, Claudina Lick, MD  traZODone (DESYREL) 50 MG tablet TAKE 1 TABLET BY MOUTH EVERYDAY AT BEDTIME Patient taking differently: Take 50 mg by mouth at bedtime. 09/14/21  Yes Burns, Claudina Lick, MD  food thickener (SIMPLYTHICK, NECTAR/LEVEL 2/MILDLY THICK,) GEL Take 10 packets by mouth as needed. 10/02/21   Hosie Poisson, MD  promethazine  (PHENERGAN) 25 MG/ML injection Inject 12.5 mg into the vein once.    [provider]                                                                                                                                    Allergies Macrobid [nitrofurantoin monohyd macro]  Review of  Systems Review of Systems As noted in HPI  Physical Exam Vital Signs  I have reviewed the triage vital signs BP 124/70    Pulse (!) 115    Temp 98.6 F (37 C) (Rectal)    Resp (!) 26    Ht 5\' 1"  (1.549 m)    Wt 64 kg    SpO2 91%    BMI 26.66 kg/m   Physical Exam Vitals reviewed.  Constitutional:      General: She is not in acute distress.    Appearance: She is well-developed. She is not diaphoretic.  HENT:     Head: Normocephalic and atraumatic.     Nose: Nose normal.  Eyes:     General: No scleral icterus.       Right eye: No discharge.        Left eye: No discharge.     Conjunctiva/sclera: Conjunctivae normal.     Pupils: Pupils are equal, round, and reactive to light.  Cardiovascular:     Rate and Rhythm: Regular rhythm. Tachycardia present.     Heart sounds: No murmur heard.   No friction rub. No gallop.  Pulmonary:     Effort: Tachypnea and respiratory distress present.     Breath sounds: No stridor. Examination of the right-middle field reveals rales. Examination of the left-middle field reveals rales. Examination of the right-lower field reveals rales. Examination of the left-lower field reveals rales. Rales present.  Abdominal:     General: There is no distension.     Palpations: Abdomen is soft.     Tenderness: There is no abdominal tenderness.  Musculoskeletal:        General: No tenderness.     Cervical back: Normal range of motion and neck supple.  Skin:    General: Skin is warm and dry.     Findings: No erythema or rash.  Neurological:     Mental Status: She is oriented to person, place, and time. She is lethargic.     Comments: Right facial droop and right extremity  deficits. Follows commands    ED Results and Treatments Labs (all labs ordered are listed, but only abnormal results are displayed) Labs Reviewed  LACTIC ACID, PLASMA - Abnormal; Notable for the following components:      Result Value   Lactic Acid, Venous 2.8 (*)    All other components within normal limits  LACTIC ACID, PLASMA - Abnormal; Notable for the following components:   Lactic Acid, Venous 2.7 (*)    All other components within normal limits  COMPREHENSIVE METABOLIC PANEL - Abnormal; Notable for the following components:   CO2 19 (*)    Glucose, Bld 147 (*)    BUN 30 (*)    Creatinine, Ser 1.62 (*)    Albumin 2.5 (*)    AST 44 (*)    ALT 47 (*)    Alkaline Phosphatase 241 (*)    GFR, Estimated 34 (*)    All other components within normal limits  CBC WITH DIFFERENTIAL/PLATELET - Abnormal; Notable for the following components:   WBC 20.1 (*)    RBC 3.62 (*)    Hemoglobin 10.0 (*)    HCT 32.5 (*)    Platelets 450 (*)    Neutro Abs 17.5 (*)    Monocytes Absolute 1.3 (*)    Abs Immature Granulocytes 0.09 (*)    All other components within normal limits  URINALYSIS, ROUTINE W REFLEX MICROSCOPIC - Abnormal; Notable for the following components:  APPearance HAZY (*)    All other components within normal limits  I-STAT CHEM 8, ED - Abnormal; Notable for the following components:   BUN 31 (*)    Creatinine, Ser 1.60 (*)    Glucose, Bld 139 (*)    Hemoglobin 10.5 (*)    HCT 31.0 (*)    All other components within normal limits  I-STAT VENOUS BLOOD GAS, ED - Abnormal; Notable for the following components:   pH, Ven 7.210 (*)    pO2, Ven 123 (*)    Acid-base deficit 6.0 (*)    HCT 31.0 (*)    Hemoglobin 10.5 (*)    All other components within normal limits  RESP PANEL BY RT-PCR (FLU A&B, COVID) ARPGX2  CULTURE, BLOOD (ROUTINE X 2)  CULTURE, BLOOD (ROUTINE X 2)  PROTIME-INR  APTT  BRAIN NATRIURETIC PEPTIDE                                                                                                                          EKG  EKG Interpretation  Date/Time:  Saturday October 07 2021 03:16:57 EST Ventricular Rate:  127 PR Interval:  139 QRS Duration: 85 QT Interval:  327 QTC Calculation: 476 R Axis:   -85 Text Interpretation: Sinus tachycardia Consider right ventricular hypertrophy Inferior infarct, old Baseline wander in lead(s) V6 Confirmed by Addison Lank [phone]) on 10/07/2021 6:12:59 AM       Radiology DG Chest Port 1 View  Result Date: 10/07/2021 CLINICAL DATA:  Shortness of breath EXAM: PORTABLE CHEST 1 VIEW COMPARISON:  09/26/2021 FINDINGS: Cardiac shadow is stable. Increased central vascular congestion is noted with patchy airspace opacities which may represent edema or early infiltrate. No sizable effusion is noted. Postsurgical changes in the right shoulder are noted. IMPRESSION: Increasing vascular congestion with patchy airspace opacity which may represent edema or acute multifocal infiltrate Electronically Signed   By: Inez Catalina M.D.   On: 10/07/2021 03:47    Pertinent labs & imaging results that were available during my care of the patient were reviewed by me and considered in my medical decision making (see MDM for details).  Medications Ordered in ED Medications  0.9 %  sodium chloride infusion ( Intravenous New Bag/Given 10/07/21 0404)  sodium chloride 0.9 % bolus 1,000 mL (0 mLs Intravenous Stopped 10/07/21 0552)    Followed by  sodium chloride 0.9 % bolus 1,000 mL (0 mLs Intravenous Stopped 10/07/21 0634)    Followed by  0.9 %  sodium chloride infusion (0 mLs Intravenous Hold 10/07/21 0533)  vancomycin (VANCOREADY) IVPB 1250 mg/250 mL (0 mg Intravenous Stopped 10/07/21 0559)  piperacillin-tazobactam (ZOSYN) IVPB 3.375 g (0 g Intravenous Stopped 10/07/21 0452)  Procedures .1-3 Lead EKG  Interpretation Performed by: Fatima Blank, MD Authorized by: Fatima Blank, MD     Interpretation: abnormal     ECG rate:  123   ECG rate assessment: tachycardic     Rhythm: sinus rhythm     Ectopy: none     Conduction: normal   .Critical Care Performed by: Fatima Blank, MD Authorized by: Fatima Blank, MD   Critical care provider statement:    Critical care time (minutes):  55   Critical care time was exclusive of:  Separately billable procedures and treating other patients   Critical care was necessary to treat or prevent imminent or life-threatening deterioration of the following conditions:  Sepsis and respiratory failure   Critical care was time spent personally by me on the following activities:  Development of treatment plan with patient or surrogate, discussions with consultants, evaluation of patient's response to treatment, examination of patient, obtaining history from patient or surrogate, review of old charts, re-evaluation of patient's condition, pulse oximetry, ordering and review of radiographic studies, ordering and review of laboratory studies and ordering and performing treatments and interventions   Care discussed with: admitting provider    (including critical care time)  Medical Decision Making / ED Course        Respiratory distress Considering COPD exacerbation, pneumonia (likely aspiration), or heart failure.  Given the indolent onset, less likely PE but will reconsider if rest of the work-up is reassuring  Work-up ordered to assess concerns above.  Labs and imaging independently interpreted by me and noted below: CBC with leukocytosis.  Hemoglobin stable. No significant electrolyte derangements.  Mild renal insufficiency. Baseline LFTs. Chest x-ray with evidence concerning for other pulmonary edema versus pneumonia. Lactic acid at 2.8 COVID/influenza negative UA negative BNP normal.  Management: Code sepsis was  initiated and patient started on empiric antibiotics to cover for aspiration PNA Maintenance IV fluids given while working up for heart failure. On reassessment patient's work of breathing had improved.  She is more alert and active. Blood pressures began trending down with systolics in the 16X. After BnP resulted, 2 L of IV fluid boluses were ordered.  Prior to starting the fluid boluses, her blood pressure improved to the 100s.  Regardless boluses were given. After the first liter of IV fluid boluses her BPs improved to the 110s- 120s. Consult to hospitalist service who agreed to admit patient for continued work-up and management.   Final Clinical Impression(s) / ED Diagnoses Final diagnoses:  Acute and chronic respiratory failure with hypoxia (HCC)  Aspiration pneumonia of both lower lobes due to gastric secretions ([place])  Sepsis with acute hypoxic respiratory failure without septic shock, due to unspecified organism [facility])           This chart was dictated using voice recognition software.  Despite best efforts to proofread,  errors can occur which can change the documentation meaning.    Fatima Blank, MD 10/07/21 [phone]

## 2021-10-07 NOTE — TOC Initial Note (Signed)
Transition of Care [facility]) - Initial/Assessment Note    Patient Details  Name: Sheryl [name] MRN: [id number] Date of Birth: 04-17-1951  Transition of Care [facility]) CM/SW Contact:    Verdell Carmine, RN Phone Number: 10/07/2021, 8:56 AM  Clinical Narrative:                 71 yo patient discharged 5 days ago due to stroke to [facility] SNF. Returns with [facility], cough and sepsis. Patient has a history of COPD on 2LPM of oxygen. Increased oxygen demand.  Will likely return to SNF post discharge. May have increased oxygen requirements. PT will be involved in care for continuing assessment and treatment.   TOC to follow for needs, recommendations, and transitions  Expected Discharge Plan: [place] Barriers to Discharge: Continued Medical Work up   Patient Goals and CMS Choice     Choice offered to / list presented to : Patient  Expected Discharge Plan and Services Expected Discharge Plan: [place] In-house Referral: Clinical Social Work   Post Acute Care Choice: [place] Living arrangements for the past 2 months: [place]                                      Prior Living Arrangements/Services Living arrangements for the past 2 months: [place] Lives with:: Facility Resident Patient language and need for interpreter reviewed:: Yes        Need for Family Participation in Patient Care: Yes (Comment) Care giver support system in place?: Yes (comment)   Criminal Activity/Legal Involvement Pertinent to Current Situation/Hospitalization: No - Comment as needed  Activities of Daily Living      Permission Sought/Granted                  Emotional Assessment       Orientation: : Oriented to Self, Oriented to Place Alcohol / Substance Use: Not Applicable Psych Involvement: No (comment)  Admission diagnosis:  Sepsis [facility]) [A41.9] Patient Active Problem List   Diagnosis Date Noted    Sepsis ([place]) 10/07/2021   Hypernatremia 09/30/2021   Acute ischemic left MCA stroke ([place]) 09/26/2021   Rhabdomyolysis 09/26/2021   Right groin pain 04/17/2021   Tobacco abuse 12/14/2020   Restless leg syndrome 12/14/2020   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   Osteoporosis 05/21/2020   Aortic atherosclerosis ([place]) 05/09/2020   GERD (gastroesophageal reflux disease) 04/12/2020   Anemia 04/01/2019   S/P shoulder replacement, 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   Vertigo 06/05/2018   Status post total knee replacement, 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   Stage 3a chronic kidney disease (CKD) ([place]) - baseline SCr 1.1-1.3 12/27/2016   Insomnia 06/29/2016   Prediabetes 06/29/2016   LGSIL (low grade squamous intraepithelial dysplasia) 12/08/2015   COPD (chronic obstructive pulmonary disease) ([place]) 12/09/2014   COPD exacerbation ([place]) 11/02/2014   Lumbago 07/16/2014   Bilateral hand pain 07/16/2014   HTN (hypertension) 06/04/2012   Hyperlipidemia 06/04/2012   Depression 06/04/2012   ADD (attention deficit disorder) 06/04/2012   AR (allergic rhinitis) 06/04/2012   PCP:  Binnie Rail, MD Pharmacy:   [id number] - [place], [place] - [address] [place] [address] Phone: [phone] Fax: [phone]     Social Determinants of Health (SDOH) Interventions    Readmission Risk Interventions No flowsheet data found.

## 2021-10-07 NOTE — Progress Notes (Signed)
A consult was placed to IV Therapy to place an USGPIV for pressors;  have sent secure chat to RN who verified the pt has 2 sites currently and is not currently on the Levophed;  she will enter a new consult to IV Team if an USGPIV is needed.

## 2021-10-07 NOTE — ED Notes (Signed)
CRITICAL CARE PAGED AT 8:55 PER DR Lorin Mercy

## 2021-10-07 NOTE — Assessment & Plan Note (Signed)
-  She has persistent swallow impairment resulting from the stroke -ST recommendations included:  -Dysphagia 1 (Puree) solids;Honey thick liquids  - Medication Administration: Crushed with puree  -Full supervision/cueing for compensatory strategies;Staff to assist with self feeding  -Remain semi-upright after after feeds/meals (Comment);Seated upright at 90 degrees  -Order thickener from pharmacy;Prohibited food (jello, ice cream, thin soups);Remove water pitcher;Have oral suction available -Regardless of whether these strategies were followed, the patient appears to have aspirated and to be significantly aspirating -Based on concerns about her inability to protect her airway, I have consulted PCCM

## 2021-10-07 NOTE — Assessment & Plan Note (Signed)
-  Previously admitted with acute L MCA CVA, marked deficits including R hemiplegia and dysphagia/dysarthria -She was started on ASA 325 mg + Plavix 75 mg daily -She appears to have symptomatic left carotid stenosis and is currently planned for outpatient vascular intervention

## 2021-10-07 NOTE — ED Notes (Signed)
Dr Lorin Mercy is at pt's bedside

## 2021-10-07 NOTE — Progress Notes (Signed)
Pharmacy Antibiotic Note  Sheryl [name] is a 71 y.o. female admitted on 10/07/2021 with  pneumonia .  Pharmacy has been consulted for Zosyn dosing.  WBC and LA elevated. CrCl ~ 27 mL/min   Plan: -Zosyn 3.375 gm IV Q 8 hours (EI infusion) -Monitor CBC, renal fx, cultures and clinical progress  Height: 5\' 1"  (154.9 cm) Weight: 64 kg (141 lb 1.5 oz) IBW/kg (Calculated) : 47.8  Temp (24hrs), Avg:98.5 F (36.9 C), Min:97.9 F (36.6 C), Max:99.3 F (37.4 C)  Recent Labs  Lab 09/30/21 1542 10/07/21 0340 10/07/21 0442 10/07/21 0608  WBC  --  20.1*  --   --   CREATININE 1.19* 1.62* 1.60*  --   LATICACIDVEN  --  2.8*  --  2.7*    Estimated Creatinine Clearance: 27.6 mL/min (A) (by C-G formula based on SCr of 1.6 mg/dL (H)).    Allergies  Allergen Reactions   Macrobid [Nitrofurantoin Monohyd Macro] Nausea And Vomiting    GI upset    Antimicrobials this admission: Zosyn 2/18 >>   Dose adjustments this admission:   Microbiology results: 2/18 BCx:    Thank you for allowing pharmacy to be a part of this patients care.  Albertina Parr, PharmD., BCCCP Clinical Pharmacist Please refer to [facility] for unit-specific pharmacist

## 2021-10-07 NOTE — Progress Notes (Signed)
With shift assessment, pupils were noted to be 27mm in R eye and 2/83mm in L eye, both sluggish and reactive. Previous nurse reported both pupils being 3mm equal and reactive. RN reported changes to [facility].  At 2130, RN did another assessment and now R pupil is 14mm and L pupil remains at 2/44mm, sluggish. [place] notified.

## 2021-10-07 NOTE — Assessment & Plan Note (Signed)
-  She is on 2L home O2 at baseline but was documented to be as low as 90% despite O2 -She also has worsening tachypnea -She appears to be at high risk for needing intubation -Full code status confirmed with her daughter -PCCM will admit to the ICU at this time

## 2021-10-07 NOTE — ED Notes (Signed)
Pt was incontinent of BM, cleaned pt and applied a clean brief and clean purewick. Pt's buttocks is excoriated, applied barrier cream.

## 2021-10-07 NOTE — Procedures (Signed)
Arterial Catheter Insertion Procedure Note  Sheryl [name]  [id number]  Mar 22, 1951  Date:10/07/21  Time:4:27 PM    Provider Performing: Maryjane Hurter    Procedure: Insertion of Arterial Line [phone]) with US guidance ([id number])   Indication(s) Blood pressure monitoring and/or need for frequent ABGs  Consent Risks of the procedure as well as the alternatives and risks of each were explained to the patient and/or caregiver.  Consent for the procedure was obtained and is signed in the bedside chart  Anesthesia 1% lidocaine   Time Out Verified patient identification, verified procedure, site/side was marked, verified correct patient position, special equipment/implants available, medications/allergies/relevant history reviewed, required imaging and test results available.   Sterile Technique Maximal sterile technique including full sterile barrier drape, hand hygiene, sterile gown, sterile gloves, mask, hair covering, sterile ultrasound probe cover (if used).   Procedure Description Area of catheter insertion was cleaned with chlorhexidine and draped in sterile fashion. With real-time ultrasound guidance an arterial catheter was placed into the right femoral artery.  Appropriate arterial tracings confirmed on monitor.     Complications/Tolerance None; patient tolerated the procedure well.   EBL Minimal   Specimen(s) None

## 2021-10-07 NOTE — Progress Notes (Signed)
Critical ABG results shown to Dr. [name] at bedside.  Rate increased to 26, fio2 decreased to .60.  Will repeat ABG in one hour.

## 2021-10-07 NOTE — Sepsis Progress Note (Signed)
Elink following code sepsis °

## 2021-10-07 NOTE — Assessment & Plan Note (Signed)
-  Tachycardia, tachypnea, hypoxia, marginal BP, elevated lactate -Patient appears to have sepsis and most likely etiology is multifocal PNA, likely from asrpiation -She was on Levaquin PTA -She has been started on Zosyn, which appears to be appropriate -If MRSA swab is positive, she may also benefit from addition of Vanc -She has underlying COPD without obvious current exacerbation

## 2021-10-07 NOTE — Consult Note (Signed)
ER Consult Note   Patient: Sheryl [name] ACZ:[id number] DOB: 13-Mar-1951 DOA: 10/07/2021 DOS: the patient was seen and examined on 10/07/2021 PCP: Binnie Rail, MD  Patient coming from: SNF - [facility] rehab; NOK: Daughter, Sheryl [name], [phone]   Chief Complaint: SOB  HPI: Sheryl [name] is a 71 y.o. female with medical history significant of COPD on 2L home O2; stage 3 CKD; HTN; HLD; and prediabetes who was admitted from 2/7-13 with acute CVA.  She had noted dysphagia and was recommended for dysphagia 1 diet.   She was discharged to [facility] for rehab.  Her daughter reports that she was given crackers and Pepsi immediately upon presentation to [facility], despite recommendations for pureed diet with thickened liquids.  The patient was apparently diagnosed with PNA on 2/16 and started on Levaquin; her daughter was not notified until last night, when she had facial swelling and was given Benadryl.  She developed worsening SOB over the last few days with cough and so was sent to the ER for evaluation overnight.  This AM, the nurse called me with concern for increased WOB; she was placed on HFNC. When I went to evaluate the patient, she was quite dyspneic.  She tried to have conversation but would cough continuously whenever she spoke.  Due to concerns for aspiration and airway protection, I called her daughter to discuss code status and consulted PCCM.    ER Course:  L MCA last week, went to SNF rehab.  Was being treated for PNA at SNF and got more hypoxic, tachycardic.  ?aspiration, has PNA.  Marginal BPs, given bolus with improvement.  Currently on 4L.        Review of Systems: unable to review all systems due to the inability of the patient to answer questions. Past Medical History:  Diagnosis Date   ADD (attention deficit disorder)    Allergy    Arthritis    CKD (chronic kidney disease), stage III (HCC)    COPD (chronic obstructive pulmonary disease) ([place]) 12/09/2014    Depression    Does use hearing aid    Dyspnea    on exertion   Hyperlipidemia    Hypertension    LGSIL (low grade squamous intraepithelial dysplasia)    Osteoporosis    Pre-diabetes    Past Surgical History:  Procedure Laterality Date   JOINT REPLACEMENT     right knee   MULTIPLE TOOTH EXTRACTIONS     REPLACEMENT TOTAL KNEE  2013   right   REVERSE SHOULDER ARTHROPLASTY Right 03/30/2019   Procedure: REVERSE TOTAL SHOULDER ARTHROPLASTY;  Surgeon: Netta Cedars, MD;  Location: [facility];  Service: Orthopedics;  Laterality: Right;  interscalene block   TOTAL KNEE ARTHROPLASTY Left 12/27/2017   TOTAL KNEE ARTHROPLASTY Left 12/27/2017   Procedure: LEFT TOTAL KNEE ARTHROPLASTY;  Surgeon: Netta Cedars, MD;  Location: [facility];  Service: Orthopedics;  Laterality: Left;   Social History:  reports that she has been smoking cigarettes. She has a 12.25 pack-year smoking history. She has never used smokeless tobacco. She reports that she does not drink alcohol and does not use drugs.  Allergies  Allergen Reactions   Macrobid [Nitrofurantoin Monohyd Macro] Nausea And Vomiting    GI upset    Family History  Problem Relation Age of Onset   Hyperlipidemia Mother    Alcohol abuse Father    Arthritis Father    Colon polyps Father    Alcohol abuse Maternal Uncle    Stroke Maternal Grandmother  Prior to Admission medications   Medication Sig Start Date End Date Taking? Authorizing Provider  albuterol (VENTOLIN HFA) 108 (90 Base) MCG/ACT inhaler Inhale 1-2 puffs into the lungs every 4 (four) hours as needed for wheezing or shortness of breath. 05/10/20  Yes Burns, Claudina Lick, MD  amLODipine (NORVASC) 5 MG tablet TAKE 1 TABLET BY MOUTH EVERY DAY Patient taking differently: Take 5 mg by mouth daily. 05/04/21  Yes Burns, Claudina Lick, MD  aspirin EC 325 MG EC tablet Take 1 tablet (325 mg total) by mouth daily. 10/02/21  Yes Hosie Poisson, MD  atomoxetine (STRATTERA) 100 MG capsule TAKE 1 CAPSULE BY MOUTH EVERY  DAY Patient taking differently: Take 100 mg by mouth daily. 08/07/21  Yes Burns, Claudina Lick, MD  Budeson-Glycopyrrol-Formoterol (BREZTRI AEROSPHERE) 160-9-4.8 MCG/ACT AERO Inhale 2 puffs into the lungs 2 (two) times daily. 05/30/21  Yes Burns, Claudina Lick, MD  clopidogrel (PLAVIX) 75 MG tablet Take 1 tablet (75 mg total) by mouth daily. 10/02/21  Yes Hosie Poisson, MD  cyclobenzaprine (FLEXERIL) 10 MG tablet Take 0.5-1 tablets (5-10 mg total) by mouth 3 (three) times daily as needed for muscle spasms. Patient taking differently: Take 5 mg by mouth 3 (three) times daily as needed for muscle spasms. 04/17/21  Yes Burns, Claudina Lick, MD  FLUoxetine (PROZAC) 20 MG capsule TAKE 1 CAPSULE (20 MG TOTAL) BY MOUTH DAILY IN ADDITION TO 40 MG FOR A TOTAL OF 60 MG. Patient taking differently: Take 20 mg by mouth daily. Along with 40 mg to equal 60 mg 04/17/21  Yes Burns, Claudina Lick, MD  FLUoxetine (PROZAC) 40 MG capsule TAKE 1 CAPSULE BY MOUTH DAILY. IN ADDITION TO 20 MG FOR TOTAL OF 60 MG DAILY Patient taking differently: Take 40 mg by mouth daily. Along with 20 mg to equal 60 mg 09/14/21  Yes Burns, Claudina Lick, MD  fluticasone [facility]) 50 MCG/ACT nasal spray Place 1 spray into both nostrils daily as needed for allergies.   Yes [provider]  levofloxacin (LEVAQUIN) 750 MG tablet Take 750 mg by mouth See admin instructions. Every 48 hours x 4 doses for pneumonia   Yes [provider]  losartan (COZAAR) 100 MG tablet TAKE 1 TABLET BY MOUTH EVERY DAY Patient taking differently: Take 100 mg by mouth daily. 09/11/21  Yes Burns, Claudina Lick, MD  meclizine (ANTIVERT) 25 MG tablet Take 1 tablet (25 mg total) by mouth 3 (three) times daily as needed for dizziness. 09/28/19  Yes Burns, Claudina Lick, MD  nicotine (NICODERM CQ - DOSED IN MG/24 HOURS) 14 mg/24hr patch Place 1 patch (14 mg total) onto the skin daily. 10/02/21  Yes Hosie Poisson, MD  omeprazole (PRILOSEC) 20 MG capsule TAKE 1 CAPSULE BY MOUTH EVERY DAY Patient taking  differently: Take 20 mg by mouth daily. 06/21/21  Yes Burns, Claudina Lick, MD  OXYGEN Inhale 2 L into the lungs as needed (to keep 02 sats above 90%).   Yes [provider]  rOPINIRole (REQUIP) 0.25 MG tablet TAKE 1 TABLET BY MOUTH AT BEDTIME. Patient taking differently: Take 0.25 mg by mouth at bedtime. 06/12/21  Yes Burns, Claudina Lick, MD  rosuvastatin (CRESTOR) 20 MG tablet Take 1 tablet (20 mg total) by mouth daily. 04/17/21  Yes Burns, Claudina Lick, MD  traZODone (DESYREL) 50 MG tablet TAKE 1 TABLET BY MOUTH EVERYDAY AT BEDTIME Patient taking differently: Take 50 mg by mouth at bedtime. 09/14/21  Yes Burns, Claudina Lick, MD  food thickener (SIMPLYTHICK, NECTAR/LEVEL 2/MILDLY THICK,) GEL  Take 10 packets by mouth as needed. 10/02/21   Hosie Poisson, MD  promethazine (PHENERGAN) 25 MG/ML injection Inject 12.5 mg into the vein once.    [provider]    Physical Exam: Vitals:   10/07/21 0830 10/07/21 0900 10/07/21 0954 10/07/21 1000  BP:  (!) 102/53  (!) 99/51  Pulse:  (!) 108  (!) 130  Resp:  (!) 26  (!) 27  Temp: 98.6 F (37 C)   99.4 F (37.4 C)  TempSrc: Rectal   Axillary  SpO2:  96% 97% 99%  Weight:      Height:    5\' 1"  (1.549 m)   General:  Appears critically ill Eyes:  PERRL, EOMI, normal lids, iris ENT:  grossly normal hearing, lips & very dry tongue, very dry mm Neck:  no LAD, masses or thyromegaly Cardiovascular:  RR with tachycardia, no m/r/g. No LE edema.  Respiratory:   Diffuse rhonchi with markedly diminished breath sounds on the R > L.  Increased respiratory effort.  Refractory coughing with any attempt at conversation. Abdomen:  soft, ND, mild LLQ TTP Skin:  no rash or induration seen on limited exam Musculoskeletal:  R hemiplegia Psychiatric:  blunted mood and affect, speech dysarthric and with immediate cough Neurologic:  unable to effectively evalaute   Radiological Exams on Admission: Independently reviewed - see discussion in A/P where applicable  DG Chest  Port 1 View  Result Date: 10/07/2021 CLINICAL DATA:  Shortness of breath EXAM: PORTABLE CHEST 1 VIEW COMPARISON:  09/26/2021 FINDINGS: Cardiac shadow is stable. Increased central vascular congestion is noted with patchy airspace opacities which may represent edema or early infiltrate. No sizable effusion is noted. Postsurgical changes in the right shoulder are noted. IMPRESSION: Increasing vascular congestion with patchy airspace opacity which may represent edema or acute multifocal infiltrate Electronically Signed   By: Inez Catalina M.D.   On: 10/07/2021 03:47    EKG: Independently reviewed.  Sinus tachycardia with rate 127; nonspecific ST changes with no evidence of acute ischemia   Labs on Admission: I have personally reviewed the available labs and imaging studies at the time of the admission.  Pertinent labs:    ABG: 7.210/56.4/123 CO2 19 Glucose 147 BN 30/Creatinine 1.62/GFR 34 AP 241 Albumin 2.5 AST 44/ALT 47 Lactate 2.8, 2.7 WBC 20.1 Hgb 10.0 Platelets 450 COVID/flu negative UA WNL    Assessment and Plan: * Acute respiratory failure with hypoxia (HCC)- (present on admission) -She is on 2L home O2 at baseline but was documented to be as low as 90% despite O2 -She also has worsening tachypnea -She appears to be at high risk for needing intubation -Full code status confirmed with her daughter -PCCM will admit to the ICU at this time  Dysphagia as late effect of cerebrovascular accident (CVA) -She has persistent swallow impairment resulting from the stroke -ST recommendations included:  -Dysphagia 1 (Puree) solids;Honey thick liquids   - Medication Administration: Crushed with puree   -Full supervision/cueing for compensatory strategies;Staff to assist with self feeding   -Remain semi-upright after after feeds/meals (Comment);Seated upright at 90 degrees  -Order thickener from pharmacy;Prohibited food (jello, ice cream, thin soups);Remove water pitcher;Have oral suction  available -Regardless of whether these strategies were followed, the patient appears to have aspirated and to be significantly aspirating -Based on concerns about her inability to protect her airway, I have consulted PCCM  Sepsis due to pneumonia [facility])- (present on admission) -Tachycardia, tachypnea, hypoxia, marginal BP, elevated lactate -Patient appears to have  sepsis and most likely etiology is multifocal PNA, likely from asrpiation -She was on Levaquin PTA -She has been started on Zosyn, which appears to be appropriate -If MRSA swab is positive, she may also benefit from addition of Vanc -She has underlying COPD without obvious current exacerbation  Acute ischemic left MCA stroke ([place])- (present on admission) -Previously admitted with acute L MCA CVA, marked deficits including R hemiplegia and dysphagia/dysarthria -She was started on ASA 325 mg + Plavix 75 mg daily -She appears to have symptomatic left carotid stenosis and is currently planned for outpatient vascular intervention    Based on the severity of the illness, the patient was admitted to PCCM.  She is at high risk for intubation and death.   Total critical care time: 55 minutes Critical care time was exclusive of separately billable procedures and treating other patients. Critical care was necessary to treat or prevent imminent or life-threatening deterioration. Critical care was time spent personally by me on the following activities: development of treatment plan with patient and/or surrogate as well as nursing, discussions with consultants, evaluation of patient's response to treatment, examination of patient, obtaining history from patient or surrogate, ordering and performing treatments and interventions, ordering and review of laboratory studies, ordering and review of radiographic studies, pulse oximetry and re-evaluation of patient's condition.    Advance Care Planning:   Code Status: Full Code   Consults:  PCCM  Family Communication: I spoke with the daughter twice by telephone   Author: Karmen Bongo, MD 10/07/2021 11:06 AM  For on call review [email or web address].

## 2021-10-08 ENCOUNTER — Inpatient Hospital Stay (HOSPITAL_COMMUNITY): Payer: Medicare PPO

## 2021-10-08 ENCOUNTER — Inpatient Hospital Stay: Payer: Self-pay

## 2021-10-08 DIAGNOSIS — I6522 Occlusion and stenosis of left carotid artery: Secondary | ICD-10-CM

## 2021-10-08 DIAGNOSIS — I69391 Dysphagia following cerebral infarction: Secondary | ICD-10-CM

## 2021-10-08 DIAGNOSIS — I63512 Cerebral infarction due to unspecified occlusion or stenosis of left middle cerebral artery: Secondary | ICD-10-CM

## 2021-10-08 DIAGNOSIS — R6520 Severe sepsis without septic shock: Secondary | ICD-10-CM

## 2021-10-08 DIAGNOSIS — J9601 Acute respiratory failure with hypoxia: Secondary | ICD-10-CM | POA: Diagnosis not present

## 2021-10-08 DIAGNOSIS — J189 Pneumonia, unspecified organism: Secondary | ICD-10-CM

## 2021-10-08 LAB — POCT I-STAT 7, (LYTES, BLD GAS, ICA,H+H)
Acid-Base Excess: 2 mmol/L (ref 0.0–2.0)
Acid-Base Excess: 1 mmol/L (ref 0.0–2.0)
Bicarbonate: 27.4 mmol/L (ref 20.0–28.0)
Bicarbonate: 26.2 mmol/L (ref 20.0–28.0)
Calcium, Ion: 1.19 mmol/L (ref 1.15–1.40)
Calcium, Ion: 1.22 mmol/L (ref 1.15–1.40)
HCT: 21 % — ABNORMAL LOW (ref 36.0–46.0)
HCT: 19 % — ABNORMAL LOW (ref 36.0–46.0)
Hemoglobin: 7.1 g/dL — ABNORMAL LOW (ref 12.0–15.0)
Hemoglobin: 6.5 g/dL — CL (ref 12.0–15.0)
O2 Saturation: 92 %
O2 Saturation: 88 %
Patient temperature: 36.7
Patient temperature: 38.3
Potassium: 3.7 mmol/L (ref 3.5–5.1)
Potassium: 4 mmol/L (ref 3.5–5.1)
Sodium: 142 mmol/L (ref 135–145)
Sodium: 142 mmol/L (ref 135–145)
TCO2: 29 mmol/L (ref 22–32)
TCO2: 27 mmol/L (ref 22–32)
pCO2 arterial: 48.9 mmHg — ABNORMAL HIGH (ref 32–48)
pCO2 arterial: 45.6 mmHg (ref 32–48)
pH, Arterial: 7.355 (ref 7.35–7.45)
pH, Arterial: 7.373 (ref 7.35–7.45)
pO2, Arterial: 66 mmHg — ABNORMAL LOW (ref 83–108)
pO2, Arterial: 60 mmHg — ABNORMAL LOW (ref 83–108)

## 2021-10-08 LAB — HEMOGLOBIN AND HEMATOCRIT, BLOOD
HCT: 20.9 % — ABNORMAL LOW (ref 36.0–46.0)
Hemoglobin: 6.5 g/dL — CL (ref 12.0–15.0)

## 2021-10-08 LAB — BASIC METABOLIC PANEL
Anion gap: 10 (ref 5–15)
BUN: 26 mg/dL — ABNORMAL HIGH (ref 8–23)
CO2: 25 mmol/L (ref 22–32)
Calcium: 8.1 mg/dL — ABNORMAL LOW (ref 8.9–10.3)
Chloride: 104 mmol/L (ref 98–111)
Creatinine, Ser: 1.21 mg/dL — ABNORMAL HIGH (ref 0.44–1.00)
GFR, Estimated: 48 mL/min — ABNORMAL LOW (ref >60)
Glucose, Bld: 130 mg/dL — ABNORMAL HIGH (ref 70–99)
Potassium: 3.6 mmol/L (ref 3.5–5.1)
Sodium: 139 mmol/L (ref 135–145)

## 2021-10-08 LAB — CBC
HCT: 23.7 % — ABNORMAL LOW (ref 36.0–46.0)
Hemoglobin: 7.4 g/dL — ABNORMAL LOW (ref 12.0–15.0)
MCH: 28 pg (ref 26.0–34.0)
MCHC: 31.2 g/dL (ref 30.0–36.0)
MCV: 89.8 fL (ref 80.0–100.0)
Platelets: 376 10*3/uL (ref 150–400)
RBC: 2.64 MIL/uL — ABNORMAL LOW (ref 3.87–5.11)
RDW: 15.1 % (ref 11.5–15.5)
WBC: 20.6 10*3/uL — ABNORMAL HIGH (ref 4.0–10.5)
nRBC: 0 % (ref 0.0–0.2)

## 2021-10-08 LAB — TRIGLYCERIDES: Triglycerides: 71 mg/dL (ref <150)

## 2021-10-08 LAB — GLUCOSE, CAPILLARY
Glucose-Capillary: 154 mg/dL — ABNORMAL HIGH (ref 70–99)
Glucose-Capillary: 121 mg/dL — ABNORMAL HIGH (ref 70–99)
Glucose-Capillary: 127 mg/dL — ABNORMAL HIGH (ref 70–99)
Glucose-Capillary: 91 mg/dL (ref 70–99)
Glucose-Capillary: 107 mg/dL — ABNORMAL HIGH (ref 70–99)
Glucose-Capillary: 114 mg/dL — ABNORMAL HIGH (ref 70–99)
Glucose-Capillary: 141 mg/dL — ABNORMAL HIGH (ref 70–99)

## 2021-10-08 LAB — PREPARE RBC (CROSSMATCH)

## 2021-10-08 LAB — LACTIC ACID, PLASMA: Lactic Acid, Venous: 0.9 mmol/L (ref 0.5–1.9)

## 2021-10-08 MED ORDER — FLUOXETINE HCL 20 MG/5ML PO SOLN
40.0000 mg | Freq: Every day | ORAL | Status: DC
Start: 2021-10-08 — End: 2021-10-11
  Administered 2021-10-08 – 2021-10-10 (×3): 40 mg
  Filled 2021-10-08 (×4): qty 10

## 2021-10-08 MED ORDER — ASPIRIN 325 MG PO TABS
325.0000 mg | ORAL_TABLET | Freq: Every day | ORAL | Status: DC
  Administered 2021-10-08 – 2021-10-10 (×3): 325 mg
  Filled 2021-10-08 (×4): qty 1

## 2021-10-08 MED ORDER — IOHEXOL 350 MG/ML SOLN
100.0000 mL | Freq: Once | INTRAVENOUS | Status: AC | PRN
  Administered 2021-10-08: 100 mL via INTRAVENOUS

## 2021-10-08 MED ORDER — SODIUM CHLORIDE 0.9% IV SOLUTION: Freq: Once | INTRAVENOUS | Status: AC

## 2021-10-08 MED ORDER — ROSUVASTATIN CALCIUM 20 MG PO TABS
40.0000 mg | ORAL_TABLET | Freq: Every day | ORAL | Status: DC
  Administered 2021-10-08 – 2021-10-10 (×3): 40 mg
  Filled 2021-10-08 (×4): qty 2

## 2021-10-08 MED ORDER — MIDAZOLAM HCL 2 MG/2ML IJ SOLN
2.0000 mg | Freq: Once | INTRAMUSCULAR | Status: AC | PRN
  Administered 2021-10-08: 2 mg via INTRAVENOUS
  Filled 2021-10-08: qty 2

## 2021-10-08 MED ORDER — FENTANYL BOLUS VIA INFUSION
25.0000 ug | INTRAVENOUS | Status: DC | PRN
  Administered 2021-10-08 (×3): 50 ug via INTRAVENOUS
  Filled 2021-10-08: qty 100

## 2021-10-08 MED ORDER — ACETAMINOPHEN 160 MG/5ML PO SOLN
650.0000 mg | Freq: Four times a day (QID) | ORAL | Status: DC | PRN
  Administered 2021-10-08 – 2021-10-10 (×3): 650 mg
  Filled 2021-10-08 (×3): qty 20.3

## 2021-10-08 MED ORDER — ENOXAPARIN SODIUM 40 MG/0.4ML IJ SOSY
40.0000 mg | PREFILLED_SYRINGE | Freq: Every day | INTRAMUSCULAR | Status: DC
  Administered 2021-10-09 – 2021-10-11 (×3): 40 mg via SUBCUTANEOUS
  Filled 2021-10-08 (×3): qty 0.4

## 2021-10-08 MED ORDER — POTASSIUM CHLORIDE 20 MEQ PO PACK
20.0000 meq | PACK | Freq: Once | ORAL | Status: AC
  Administered 2021-10-08: 20 meq
  Filled 2021-10-08: qty 1

## 2021-10-08 MED ORDER — CHLORHEXIDINE GLUCONATE CLOTH 2 % EX PADS
6.0000 | MEDICATED_PAD | Freq: Every day | CUTANEOUS | Status: DC
  Administered 2021-10-09 – 2021-10-11 (×3): 6 via TOPICAL

## 2021-10-08 MED ORDER — ROSUVASTATIN CALCIUM 20 MG PO TABS: 20.0000 mg | ORAL_TABLET | Freq: Every day | ORAL | Status: DC

## 2021-10-08 MED ORDER — FLUOXETINE HCL 20 MG PO CAPS: 40.0000 mg | ORAL_CAPSULE | Freq: Every day | ORAL | Status: DC

## 2021-10-08 MED ORDER — CLOPIDOGREL BISULFATE 75 MG PO TABS
75.0000 mg | ORAL_TABLET | Freq: Every day | ORAL | Status: DC
  Administered 2021-10-08 – 2021-10-10 (×3): 75 mg
  Filled 2021-10-08 (×4): qty 1

## 2021-10-08 NOTE — Progress Notes (Signed)
PT Cancellation Note  Patient Details Name: Sheryl [name] MRN: [id number] DOB: February 11, 1951   Cancelled Treatment:    Reason Eval/Treat Not Completed: Patient not medically ready at this time as she remains intubated, sedated, and with BP unstable on levophed. Pt also receiving PICC line at this time. PT will continue to follow and evaluate when appropriate.   West Carbo, PT, DPT   Acute Rehabilitation Department Pager #: [phone]   Sandra Cockayne 10/08/2021, 5:23 PM

## 2021-10-08 NOTE — Progress Notes (Signed)
NAME:  Sheryl [name], MRN:  [id number], DOB:  12-Nov-1950, LOS: 1 ADMISSION DATE:  10/07/2021, CONSULTATION DATE:  10/07/21 REFERRING MD:  Lorin Mercy, CHIEF COMPLAINT:  increased work of breathing   History of Present Illness:  71yF with history of COPD on 2L [place], CKD stage III, chronic tobacco abuse, ADD, recent admission for left ICA 80% stenosis, left M1 occlusion with M2 branch reconstitution with possible inferior left M2 stenosis/short segment occlusion with residual right sided weakness. She was started on DAPT with plan for TCAR with vascular surgery in a few weeks, discharged to [facility] 2/13. She developed gradually worsening dyspnea over last several days and was started on levaquin in her facility with concern for pneumonia- pt and family believe it is due to aspiration.   Also developed rash under her jaw and some swelling - there was potentially concern for allergic reaction to antibiotic at facility.   In ED she was given vanc, zosyn and sepsis bolus. She had increased work of breathing on NRB, given duonebs, solumedrol, and trialed on [facility] but ultimately intubated on arrival to ICU.   Pertinent  Medical History  COPD  Chronic hypoxemic respiratory failure on 2L  L MCA CVA ICA stenosis Smoking  Significant Hospital Events: Including procedures, antibiotic start and stop dates in addition to other pertinent events   10/07/21 admitted to ICU, intubation, arterial line. CT neck with possible prevertebral soft tissue stranding but no drainable collection, CT chest with multifocal aspiration/pneumonia  Interim History / Subjective:  No acute issues overnight. Possible pupillary change. Still a little too sedated on SBT this morning to get good neuro exam.  Vent adjusted to try to match pt's effort on PS given trouble with dyssynchrony last night. Seems to want large tidal volumes, neural I-time of 0.9sec (lots of double triggering with lower I-times), long expiratory time. Placed on PC  14/5, FiO2 weaned to 40%, RR 12.    Objective   Blood pressure (!) 97/45, pulse (!) 102, temperature 98.8 F (37.1 C), resp. rate 14, height 5\' 1"  (1.549 m), weight 63.1 kg, SpO2 99 %.    Vent Mode: PCV FiO2 (%):  [40 %-80 %] 40 % Set Rate:  [12 bmp-30 bmp] 12 bmp Vt Set:  [380 mL] 380 mL PEEP:  [5 cmH20] 5 cmH20 Plateau Pressure:  [16 cmH20-25 cmH20] 25 cmH20   Intake/Output Summary (Last 24 hours) at 10/08/2021 0844 Last data filed at 10/08/2021 0700 Gross per 24 hour  Intake 1791.68 ml  Output 1210 ml  Net 581.68 ml   Filed Weights   10/07/21 0319 10/08/21 0300  Weight: 64 kg 63.1 kg    Examination: General appearance: 71 y.o., female, distressed Eyes: L pupil 4->3, R pupil 3->2 tracking appropriately HENT: NCAT; dry MM Neck: Trachea midline; no lymphadenopathy, no JVD, some edema/erythema under left jaw Lungs: rhonchi R>L, prolonged exp phase, equal chest rise CV: tachy, RR, no murmur  Abdomen: Soft, non-tender; non-distended, BS present  Extremities: No peripheral edema, warm Neuro: can follow commands to wiggle toes L>R and reaches purposefully to ETT with LUE. Does not move RUE.  Labs reviewed   Resolved Hospital Problem list     Assessment & Plan:  # Acute on chronic hypoxic hypercapnic respiratory failure # Aspiration pneumonia # Likely AECOPD, baseline PFTs however do not show obstruction - ltvv as able, trial of PC with liberalized volumes for vent synchrony, keep plat <30 - SAT/SBT today - fentanyl and propofol for rass -1 to -2 -  bronchodilators - prednisone 40 mg daily for ~5 days - tracheal aspirate, blood cultures, zosyn - follow cultures and narrow as able  # Anisocoria: Last at baseline 1900 2/18. - re-examine during SAT/SBT, still too sedated for good exam may need dedicated stroke imaging  # Sepsis due to aspiration pneumonia and possible deep space neck infection: She was a little tender under left jaw but freely able to move head side to  side and up and down without meningismus prior to intubation - seems possible this is odontogenic with poor dentition? - zosyn as above, follow cultures and narrow as able - edema/erythema under jaw marked out, will follow clinically, if worsening or area of induration concerning for drainable collection will reach out to ENT  # Carotid, MCA stenosis  # BG/CR CVA  - restart DAPT with OG in place - crestor 40 daily  # Hypotension: Likely sedation related vs resolving septic/hypovolemic shock. - hold home antihypertensives   # AKI: - f/u response to initial resuscitative measures  # MDD/mood disorder: - restart prozac  Best Practice (right click and "Reselect all SmartList Selections" daily)   Diet/type: NPO w/ meds via tube - if remains intubated today will start tube feeds DVT prophylaxis: LMWH GI prophylaxis: H2B Lines: Arterial Line Foley:  Yes, and it is still needed Code Status:  full code Last date of multidisciplinary goals of care discussion [will update today]  Critical care time: 34 minutes

## 2021-10-08 NOTE — Progress Notes (Addendum)
[facility] Progress Note Patient Name: ROMELIA BROMELL DOB: 1951/03/15 MRN: [id number]   Date of Service  10/08/2021  HPI/Events of Note  Notified of temp 100.4, Hgb on ABG 6.5, and request for prn Versed for CT scan use as patient has pulled lines despite Fentanyl and Propofol  eICU Interventions  Prn acetaminophen ordered, already on piperacillin tazobactam. Will include lactic acid as patient also on norepinephrine and may need additional resuscitation Repeat H/H ordered to confirm ABG findings One time Versed ordered on call to CT scan   Follow up repeat H/H 6.5/20.9  Ordered to transfuse 1 unit PRBC     Intervention Category Intermediate Interventions: Other: Minor Interventions: Agitation / anxiety - evaluation and management  Judd Lien 10/08/2021, 9:17 PM

## 2021-10-08 NOTE — Consult Note (Signed)
Stroke Neurology Consultation Note  Consult Requested by: Dr. [name]  Reason for Consult: stroke, anisocoria   Consult Date: 10/08/21   The history was obtained from the chart and Dr. [name].    History of Present Illness:  Sheryl [name] is a 71 y.o. Caucasian female with PMH of HTN, HLD, COPD, CKD, pre-DM, smoker and recent stroke admitted from SNF for SOB with aspiration. In ED she had increased work of breathing on NRB, received breathing treatment and antibiotics but eventually needed intubation and admitted to ICU.   On 09/26/2021 she was admitted for right sided weakness and dysarthria, found down at home. CT showed subacute left BG, CR and caudate infarcts. CTA head and neck showed left ICA 80% stenosis, left M1 and M2 branch occlusion. MRI showed large left BG/CR infarct. CUS left ICA 80-99% stenosis. EF 60-65%, LDL 87 and A1C 5.5. VVS consulted and plan to do TCAR in 2 weeks of time. She was discharged to SNF with DAPT and crestor 40. Recommend BP goal 130-160 before carotid procedure.   In ICU this admission, pt still has right sided weakness but able to follow simple commands with sedation off. However, she was found to have anisocoria with left pupil smaller than the right, neurology was consulted. Repeat MRI today showed left MCA infarct extension.    Past Medical History:  Diagnosis Date   ADD (attention deficit disorder)    Allergy    Arthritis    CKD (chronic kidney disease), stage III (HCC)    COPD (chronic obstructive pulmonary disease) ([place]) 12/09/2014   Depression    Does use hearing aid    Dyspnea    on exertion   Hyperlipidemia    Hypertension    LGSIL (low grade squamous intraepithelial dysplasia)    Osteoporosis    Pre-diabetes     Past Surgical History:  Procedure Laterality Date   JOINT REPLACEMENT     right knee   MULTIPLE TOOTH EXTRACTIONS     REPLACEMENT TOTAL KNEE  2013   right   REVERSE SHOULDER ARTHROPLASTY Right 03/30/2019   Procedure: REVERSE  TOTAL SHOULDER ARTHROPLASTY;  Surgeon: Netta Cedars, MD;  Location: [facility];  Service: Orthopedics;  Laterality: Right;  interscalene block   TOTAL KNEE ARTHROPLASTY Left 12/27/2017   TOTAL KNEE ARTHROPLASTY Left 12/27/2017   Procedure: LEFT TOTAL KNEE ARTHROPLASTY;  Surgeon: Netta Cedars, MD;  Location: [facility];  Service: Orthopedics;  Laterality: Left;    Family History  Problem Relation Age of Onset   Hyperlipidemia Mother    Alcohol abuse Father    Arthritis Father    Colon polyps Father    Alcohol abuse Maternal Uncle    Stroke Maternal Grandmother     Social History:  reports that she has been smoking cigarettes. She has a 12.25 pack-year smoking history. She has never used smokeless tobacco. She reports that she does not drink alcohol and does not use drugs.  Allergies:  Allergies  Allergen Reactions   Macrobid [Nitrofurantoin Monohyd Macro] Nausea And Vomiting    GI upset    No current facility-administered medications on file prior to encounter.   Current Outpatient Medications on File Prior to Encounter  Medication Sig Dispense Refill   albuterol (VENTOLIN HFA) 108 (90 Base) MCG/ACT inhaler Inhale 1-2 puffs into the lungs every 4 (four) hours as needed for wheezing or shortness of breath. 16 g 8   amLODipine (NORVASC) 5 MG tablet TAKE 1 TABLET BY MOUTH EVERY DAY (Patient taking differently:  Take 5 mg by mouth daily.) 90 tablet 1   aspirin EC 325 MG EC tablet Take 1 tablet (325 mg total) by mouth daily. 30 tablet 0   atomoxetine (STRATTERA) 100 MG capsule TAKE 1 CAPSULE BY MOUTH EVERY DAY (Patient taking differently: Take 100 mg by mouth daily.) 90 capsule 2   Budeson-Glycopyrrol-Formoterol (BREZTRI AEROSPHERE) 160-9-4.8 MCG/ACT AERO Inhale 2 puffs into the lungs 2 (two) times daily. 10.7 g 5   clopidogrel (PLAVIX) 75 MG tablet Take 1 tablet (75 mg total) by mouth daily. 30 tablet 1   cyclobenzaprine (FLEXERIL) 10 MG tablet Take 0.5-1 tablets (5-10 mg total) by mouth 3  (three) times daily as needed for muscle spasms. (Patient taking differently: Take 5 mg by mouth 3 (three) times daily as needed for muscle spasms.) 60 tablet 3   FLUoxetine (PROZAC) 20 MG capsule TAKE 1 CAPSULE (20 MG TOTAL) BY MOUTH DAILY IN ADDITION TO 40 MG FOR A TOTAL OF 60 MG. (Patient taking differently: Take 60 mg by mouth daily.) 90 capsule 1   fluticasone (FLONASE) 50 MCG/ACT nasal spray Place 1 spray into both nostrils daily as needed for allergies.     levofloxacin (LEVAQUIN) 750 MG tablet Take 750 mg by mouth See admin instructions. Every 48 hours x 4 doses for pneumonia     losartan (COZAAR) 100 MG tablet TAKE 1 TABLET BY MOUTH EVERY DAY (Patient taking differently: Take 100 mg by mouth daily.) 90 tablet 1   meclizine (ANTIVERT) 25 MG tablet Take 1 tablet (25 mg total) by mouth 3 (three) times daily as needed for dizziness. 30 tablet 0   nicotine (NICODERM CQ - DOSED IN MG/24 HOURS) 14 mg/24hr patch Place 1 patch (14 mg total) onto the skin daily. 28 patch 0   omeprazole (PRILOSEC) 20 MG capsule TAKE 1 CAPSULE BY MOUTH EVERY DAY (Patient taking differently: Take 20 mg by mouth daily.) 90 capsule 1   OXYGEN Inhale 2 L into the lungs as needed (to keep 02 sats above 90%).     rOPINIRole (REQUIP) 0.25 MG tablet TAKE 1 TABLET BY MOUTH AT BEDTIME. (Patient taking differently: Take 0.25 mg by mouth at bedtime.) 90 tablet 1   rosuvastatin (CRESTOR) 20 MG tablet Take 1 tablet (20 mg total) by mouth daily. 90 tablet 3   traZODone (DESYREL) 50 MG tablet TAKE 1 TABLET BY MOUTH EVERYDAY AT BEDTIME (Patient taking differently: Take 50 mg by mouth at bedtime.) 90 tablet 1   FLUoxetine (PROZAC) 40 MG capsule TAKE 1 CAPSULE BY MOUTH DAILY. IN ADDITION TO 20 MG FOR TOTAL OF 60 MG DAILY (Patient not taking: Reported on 10/07/2021) 90 capsule 0   food thickener (SIMPLYTHICK, NECTAR/LEVEL 2/MILDLY THICK,) GEL Take 10 packets by mouth as needed.     promethazine (PHENERGAN) 25 MG/ML injection Inject 12.5 mg  into the vein once.      Review of Systems: A full ROS was attempted today and was not able to be performed due to intubation on sedation.    Physical Examination: Temp:  [98.1 F (36.7 C)-98.8 F (37.1 C)] 98.5 F (36.9 C) (02/19 1118) Pulse Rate:  [91-121] 121 (02/19 1700) Resp:  [10-25] 15 (02/19 1700) BP: (88-139)/(40-56) 101/51 (02/19 1400) SpO2:  [93 %-100 %] 93 % (02/19 1700) Arterial Line BP: (133)/(60) 133/60 (02/19 1700) FiO2 (%):  [40 %-60 %] 40 % (02/19 1416) Weight:  [63.1 kg] 63.1 kg (02/19 0300)  General - Well nourished, well developed, intubated on sedation.  Ophthalmologic - fundi  not visualized due to noncooperation.  Cardiovascular - Regular rhythm and tachycardia.  Neuro - intubated on sedation and pressor, eyes closed but able to open briefly with voice, not quite following commands. With forced eye opening, eyes in mid position, able to have brief left gaze with nystagmus, able to cross midline but not able to have right gaze, not blinking to visual threat, able to track voice bilaterally, left pupil 24mm and right 30mm both reactive to light. Corneal reflex present on the left but absent on the right, gag and cough strongly present. Breathing over the vent.  Facial symmetry not able to test due to ET tube.  Tongue protrusion not cooperative. Able to against gravity on the left UE but drift to bed within 5 seconds. Left LE 3-/5 with pain stimulation. RUE and RLE hemiplegia. Right babinski positive. Sensation, coordination not cooperative and gait not tested.   Data Reviewed: CT ANGIO HEAD NECK W WO CM  Result Date: 09/26/2021 CLINICAL DATA:  Provided history: Neuro deficit, acute, stroke suspected. Additional history provided: Patient from home after daughter found patient on ground, last known well Sunday night, stroke-like symptoms, right arm weakness, slurred speech. EXAM: CT ANGIOGRAPHY HEAD AND NECK TECHNIQUE: Multidetector CT imaging of the head and neck was  performed using the standard protocol during bolus administration of intravenous contrast. Multiplanar CT image reconstructions and MIPs were obtained to evaluate the vascular anatomy. Carotid stenosis measurements (when applicable) are obtained utilizing NASCET criteria, using the distal internal carotid diameter as the denominator. RADIATION DOSE REDUCTION: This exam was performed according to the departmental dose-optimization program which includes automated exposure control, adjustment of the mA and/or kV according to patient size and/or use of iterative reconstruction technique. CONTRAST:  63mL OMNIPAQUE IOHEXOL 350 MG/ML SOLN COMPARISON:  None. FINDINGS: CT HEAD FINDINGS Brain: Cerebral volume appears normal for age. Acute or early subacute infarct measuring 4.8 x 2.7 x 3.7 cm within the left corona radiata, left caudate and lentiform nuclei, left internal capsule and left external capsule. Mild local mass effect with partial effacement of the left lateral ventricle. No midline shift. No evidence of hemorrhagic conversion. No demarcated cortical infarct. No extra-axial fluid collection. No evidence of an intracranial mass. No midline shift. Vascular: No hyperdense vessel.  Atherosclerotic calcifications. Skull: Normal. Negative for fracture or focal lesion. Sinuses: Visualized orbits show no acute finding. Orbits: No orbital mass or acute orbital finding. Review of the MIP images confirms the above findings CTA NECK FINDINGS Aortic arch: Standard aortic branching. Atherosclerotic plaque within the visualized aortic arch and proximal major branch vessels of the neck. Motion artifact limits evaluation of the right subclavian artery. Streak and beam hardening artifact arising from a dense left-sided contrast bolus limits evaluation of the left subclavian artery. Within this limitation, no definite hemodynamically significant stenosis is identified within the innominate or proximal subclavian arteries. Right  carotid system: CCA and ICA patent within the neck without significant stenosis (50% or greater). Mild atherosclerotic plaque about the carotid bifurcation and within the proximal ICA. Left carotid system: CCA and ICA patent within the neck. Soft calcified plaque about the carotid bifurcation and within the proximal ICA. Stenosis at the origin of the left ICA which is difficult to accurately quantify due to blooming artifact from calcified plaque, but appears greater than 80%. Distal to this, the left ICA is asymmetrically diminutive within the neck as compared to the right ICA. Vertebral arteries: Vertebral arteries patent within the neck without significant stenosis. Skeleton: Please refer to  the separately reported CT of the cervical spine for cervical spine findings. There, no acute bony abnormality or aggressive osseous lesion is identified. Other neck: No neck mass or cervical lymphadenopathy. Subcentimeter calcified nodule within the left thyroid lobe not meeting consensus criteria for ultrasound follow-up based on size. Upper chest: No consolidation within the imaged lung apices. Centrilobular emphysema. Review of the MIP images confirms the above findings CTA HEAD FINDINGS Anterior circulation: The intracranial internal carotid arteries are patent. Calcified plaque within both vessels with no more than mild stenosis. The left M1 middle cerebral artery is occluded at its origin. There is some reconstitution of enhancement within the M2 and more distal left MCA vessels. Over, there is a severe stenosis versus focal occlusion within an inferior division proximal M2 left MCA vessel (series 14, image 25). The M1 right MCA is patent. No right M2 proximal branch occlusion or high-grade proximal stenosis. The anterior cerebral arteries are patent. No intracranial aneurysm is identified. Posterior circulation: The intracranial vertebral arteries are patent. The basilar artery is patent. The posterior cerebral  arteries are patent. Predominantly fetal origin of the right posterior cerebral artery. A sizable left posterior communicating artery is present. Venous sinuses: Within the limitations of contrast timing, no convincing thrombus. Anatomic variants: As described. Review of the MIP images confirms the above findings These results were called by telephone at the time of interpretation on 09/26/2021 at 9:34 pm to provider Davonna Belling , who verbally acknowledged these results. CT head and CTA head impression #1 called by telephone at the time of interpretation on 09/26/2021 at 9:55 pm to provider Davonna Belling , who verbally acknowledged these results. IMPRESSION: CT head: Acute or early subacute infarct measuring 4.8 x 2.7 x 3.7 cm within the left corona radiata, left caudate and lentiform nuclei, left internal capsule and left external capsule. Local mass effect with partial effacement of the left lateral ventricle. No evidence of hemorrhagic conversion. CTA neck: 1. The left common and internal carotid arteries are patent within the neck. Prominent atherosclerotic plaque about the left carotid bifurcation and within the proximal ICA. Exact quantification of stenosis at the origin of the left ICA is difficult due to blooming artifact from calcified plaque at this site, but appears greater than 80%. Consider correlation with a carotid artery duplex ultrasound. 2. The right common and internal carotid arteries are patent within the neck without hemodynamically significant stenosis. Mild atherosclerotic plaque within the right carotid system within the neck, as described. 3. Vertebral arteries patent within the neck without significant stenosis. 4. Aortic Atherosclerosis (ICD10-I70.0) and Emphysema (ICD10-J43.9). CTA head: 1. 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 There is some reconstitution of enhancement within the M2 and more distal left MCA vessels. However, there is a superimposed severe  stenosis versus focal occlusion within an inferior division left M2 MCA vessel. 2. Calcified plaque within the intracranial internal carotid arteries with no more than mild stenosis. Electronically Signed   By: Kellie Simmering D.O.   On: 09/26/2021 21:56   DG Abd 1 View  Result Date: 09/27/2021 CLINICAL DATA:  MRI screening EXAM: ABDOMEN - 1 VIEW COMPARISON:  None. FINDINGS: Unremarkable bowel gas pattern. There is residual contrast within the bladder. No radiopaque foreign body. IMPRESSION: No radiopaque foreign body. Electronically Signed   By: Macy Mis M.D.   On: 09/27/2021 11:31   CT HEAD WO CONTRAST (5MM)  Result Date: 10/07/2021 CLINICAL DATA:  Altered mental status EXAM: CT HEAD WITHOUT CONTRAST  TECHNIQUE: Contiguous axial images were obtained from the base of the skull through the vertex without intravenous contrast. RADIATION DOSE REDUCTION: This exam was performed according to the departmental dose-optimization program which includes automated exposure control, adjustment of the mA and/or kV according to patient size and/or use of iterative reconstruction technique. COMPARISON:  MR brain, 09/27/2021 FINDINGS: Brain: No evidence of hemorrhage, hydrocephalus, or extra-axial collection. There is a hypodense, edematous lesion of the left basal ganglia and corona radiata involving the caudate and lentiform nuclei as well as the anterior limb of the left internal capsule, measuring 4.7 x 1.9 cm (series 3, image 17). There is mild mass effect on the left lateral ventricle without significant midline shift. Vascular: No hyperdense vessel or unexpected calcification. Skull: Normal. Negative for fracture or focal lesion. Sinuses/Orbits: No acute finding. Other: None. IMPRESSION: Edematous acute to subacute infarction of the left basal ganglia and corona radiata, in keeping with expected evolution of previously seen acute infarction. No evidence of hemorrhage. No midline shift. Electronically Signed   By:  Delanna Ahmadi M.D.   On: 10/07/2021 14:33   CT SOFT TISSUE NECK W CONTRAST  Result Date: 10/07/2021 CLINICAL DATA:  Soft tissue swelling, infection suspected, neck xray done concern for cervical lymphadenitis or other neck infection EXAM: CT NECK WITH CONTRAST TECHNIQUE: Multidetector CT imaging of the neck was performed using the standard protocol following the bolus administration of intravenous contrast. RADIATION DOSE REDUCTION: This exam was performed according to the departmental dose-optimization program which includes automated exposure control, adjustment of the mA and/or kV according to patient size and/or use of iterative reconstruction technique. CONTRAST:  71mL OMNIPAQUE IOHEXOL 300 MG/ML  SOLN COMPARISON:  None. FINDINGS: Pharynx and larynx: No definite mass identified within the oropharynx, although evaluation is limited due to streak artifact and support devices. Mild thickening of the prevertebral soft tissues without discrete abscess or fluid collection. Mild stranding in the surrounding fat planes. Salivary glands: No inflammation, mass, or stone. Thyroid: Normal. Lymph nodes: None enlarged or abnormal density. Vascular: Vasculature is better evaluated on recent CTA. Limited intracranial: Better evaluated on same day CT head. Visualized orbits: Negative. Mastoids and visualized paranasal sinuses: Mild paranasal sinus mucosal thickening. Skeleton: No evidence of acute abnormality on limited assessment. Multilevel facet arthropathy with probably degenerative anterolisthesis of C4 on C5. Upper chest: Evaluated on concurrent CT chest. IMPRESSION: 1. Limited evaluation of the oropharynx and larynx due to motion and support devices. Mild thickening of the prevertebral soft tissues in the neck with mild stranding of the fat planes, which could represent a mild deep space infection or inflammation. No discrete, drainable fluid collection. Electronically Signed   By: Margaretha Sheffield M.D.   On:  10/07/2021 14:52   CT CHEST W CONTRAST  Result Date: 10/07/2021 CLINICAL DATA:  Aspiration pneumonitis, altered level of consciousness, aspirated on crackers and [employer] EXAM: CT CHEST WITH CONTRAST TECHNIQUE: Multidetector CT imaging of the chest was performed during intravenous contrast administration. RADIATION DOSE REDUCTION: This exam was performed according to the departmental dose-optimization program which includes automated exposure control, adjustment of the mA and/or kV according to patient size and/or use of iterative reconstruction technique. CONTRAST:  15mL OMNIPAQUE IOHEXOL 300 MG/ML  SOLN COMPARISON:  03/31/2019 FINDINGS: Cardiovascular: Cardiomegaly. Left coronary artery calcifications. No pericardial effusion. Aortic atherosclerosis. Mediastinum/Nodes: No enlarged mediastinal, hilar, or axillary lymph nodes. Thyroid gland, trachea, and esophagus demonstrate no significant findings. Lungs/Pleura: Endotracheal intubation, tube tip above the carina. Moderate centrilobular and paraseptal emphysema. Extensive bilateral  heterogeneous and ground-glass airspace opacity, which is more dense and consolidative in the dependent bilateral lung bases. Trace associated left pleural effusions. No significant debris within the airways. Upper Abdomen: No acute abnormality. Hepatic steatosis. Esophagogastric tube with tip and side port below the diaphragm. Musculoskeletal: No chest wall abnormality. No acute osseous findings. Review of the MIP images confirms the above findings. IMPRESSION: 1. Extensive bilateral heterogeneous and ground-glass airspace opacity, which is more dense and consolidative in the dependent bilateral lung bases. Trace associated left pleural effusions. Findings are generally consistent with multifocal infection without no significant debris within the airways to suggest overt aspiration. 2. Endotracheal and esophagogastric intubation. 3. Emphysema. 4. Coronary artery disease. 5. Hepatic  steatosis. Aortic Atherosclerosis (ICD10-I70.0) and Emphysema (ICD10-J43.9). Electronically Signed   By: Delanna Ahmadi M.D.   On: 10/07/2021 14:42   MR BRAIN WO CONTRAST  Result Date: 10/08/2021 CLINICAL DATA:  Anisocoria new anisocoria, recent stroke, ICA stenosis EXAM: MRI HEAD WITHOUT CONTRAST TECHNIQUE: Multiplanar, multiecho pulse sequences of the brain and surrounding structures were obtained without intravenous contrast. COMPARISON:  MRI September 27, 2021 FINDINGS: Brain: Large acute left MCA territory infarct. The extent of acute infarct is increased relative to February 2023 MRI. In addition to the evolving left basal ganglia infarct, no now is acute or subacute large/confluent infarct involving the left frontal lobe, parietal lobe, anterior temporal lobe, and insula. Associated edema in these regions with trace (1-2 mm) rightward midline shift. Mild linear susceptibility artifact in these regions with some areas of faint T1 hyperintensity may represent mild petechial hemorrhage. No mass occupying acute hemorrhage. No hydrocephalus, mass lesion, or extra-axial fluid collection. Vascular: The left MCA flow void is absent, compatible with M1 MCA occlusion seen on prior CTA. Skull and upper cervical spine: Normal marrow signal. Sinuses/Orbits: Clear sinuses.  Unremarkable orbits. Other: Trace mastoid effusions. IMPRESSION: 1. Large acute/subacute left MCA territory infarct. The extent of infarct is increased relative to February 8 MRI. In addition to the evolving left basal ganglia infarct, there now is acute or subacute large/confluent infarct involving the left frontal, parietal, and anterior temporal lobes, and insula. 2. Associated edema with trace (1-2 mm) rightward midline shift. 3. Probable mild petechial hemorrhage without mass occupying hemorrhagic transformation. 4. The left MCA flow void is absent, compatible with M1 MCA occlusion seen on prior CTA. These results will be called to the ordering  clinician or representative by the Radiologist Assistant, and communication documented in the PACS or [employer]. Electronically Signed   By: Margaretha Sheffield M.D.   On: 10/08/2021 16:19   MR BRAIN WO CONTRAST  Result Date: 09/27/2021 CLINICAL DATA:  Stroke, follow-up. Additional history provided: Provided history: EXAM: MRI HEAD WITHOUT CONTRAST TECHNIQUE: Multiplanar, multiecho pulse sequences of the brain and surrounding structures were obtained without intravenous contrast. COMPARISON:  CT angiogram head/neck 09/26/2021. FINDINGS: Brain: Mild intermittent motion degradation. Redemonstrated acute/early subacute infarct within the left corona radiata, left caudate and lentiform nuclei, left internal capsule and left external capsule. This infarct has slightly increased in size since the CT angiogram head/neck of 09/26/2021, now measuring 5.5 x 2.7 x 3.7 cm (previously 4.8 x 2.7 x 3.7 cm). As before, there is local mass effect with partial effacement of the left lateral ventricle. No midline shift or evidence of hemorrhagic conversion. Additional small acute/early subacute infarct within the anteromedial left temporal lobe (in retrospect likely present on the prior CTA head of 09/26/2021). There are several additional small acute early/subacute cortically based infarcts within the  left temporal, parietal and frontal lobes (left MCA vascular territory). No evidence of an intracranial mass. No extra-axial fluid collection. 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 A severe stenosis versus focal occlusion was also noted within an inferior division proximal M2 left MCA vessel on this prior exam. Skull and upper cervical spine: No focal suspicious marrow lesion. Sinuses/Orbits: Visualized orbits show no acute finding. Bilateral ocular lens replacements. IMPRESSION: 5.5 x 2.7 x 3.7 cm acute/early subacute infarct within the left  corona radiata, basal ganglia, internal capsule and external capsule. This infarct has slightly increased in size since the CTA head/neck of 09/26/2021. As before, there is local mass effect with partial effacement of the left lateral ventricle. No midline shift or evidence of hemorrhagic conversion. Multiple additional small acute/early subacute cortically-based infarcts within the left frontal, parietal and temporal lobes (left MCA vascular territory). Electronically Signed   By: Kellie Simmering D.O.   On: 09/27/2021 12:18   CT C-SPINE NO CHARGE  Result Date: 09/26/2021 CLINICAL DATA:  Found on floor. EXAM: CT CERVICAL SPINE WITHOUT CONTRAST TECHNIQUE: Multidetector CT imaging of the cervical spine was performed without intravenous contrast. Multiplanar CT image reconstructions were also generated. RADIATION DOSE REDUCTION: This exam was performed according to the departmental dose-optimization program which includes automated exposure control, adjustment of the mA and/or kV according to patient size and/or use of iterative reconstruction technique. COMPARISON:  None. FINDINGS: Alignment: Normal Skull base and vertebrae: No acute fracture. No primary bone lesion or focal pathologic process. Soft tissues and spinal canal: No prevertebral fluid or swelling. No visible canal hematoma. Disc levels: Advanced diffuse degenerative facet disease bilaterally. Upper chest: Emphysema.  No acute abnormality. Other: None IMPRESSION: Advanced diffuse degenerative facet disease. No acute bony abnormality. Electronically Signed   By: Rolm Baptise M.D.   On: 09/26/2021 21:30   DG CHEST PORT 1 VIEW  Result Date: 10/08/2021 CLINICAL DATA:  Acute respiratory failure with hypoxia EXAM: PORTABLE CHEST 1 VIEW COMPARISON:  10/07/2021 FINDINGS: Endotracheal tube tip overlies the midthoracic trachea proximally 2.5 cm above the carina. Orogastric tube side port overlies the stomach. Unchanged cardiomediastinal silhouette. There are  diffuse interstitial and bilateral airspace opacities, slightly increased in the right lung in comparison to prior exam. No acute osseous abnormalities. Unchanged right reverse shoulder arthroplasty. IMPRESSION: Persistent multifocal airspace disease, slightly increased in the right lung comparison to prior, consistent with multifocal edema and/or edema. Electronically Signed   By: Maurine Simmering M.D.   On: 10/08/2021 08:43   DG CHEST PORT 1 VIEW  Result Date: 10/07/2021 CLINICAL DATA:  History of ETT Z92.89 (ICD-10-CM) EXAM: PORTABLE CHEST 1 VIEW COMPARISON:  Same day chest radiograph. FINDINGS: Endotracheal tube tip approximately 2.3 cm above the carina. Gastric tube courses below the diaphragm with the tip outside the field of view. Similar pulmonary vascular congestion and patchy interstitial and airspace opacities. No visible pleural effusions or pneumothorax. Biapical pleuroparenchymal scarring. Cardiomediastinal silhouette is unchanged. Partially imaged right shoulder arthroplasty. IMPRESSION: 1. Endotracheal tube tip approximately 2.3 cm above the carina. 2. Similar patchy interstitial and airspace opacities, which could represent multifocal pneumonia and/or edema. Electronically Signed   By: Margaretha Sheffield M.D.   On: 10/07/2021 11:38   DG Chest Port 1 View  Result Date: 10/07/2021 CLINICAL DATA:  Shortness of breath EXAM: PORTABLE CHEST 1 VIEW COMPARISON:  09/26/2021 FINDINGS: Cardiac shadow is stable. Increased central vascular congestion is noted with patchy airspace opacities  which may represent edema or early infiltrate. No sizable effusion is noted. Postsurgical changes in the right shoulder are noted. IMPRESSION: Increasing vascular congestion with patchy airspace opacity which may represent edema or acute multifocal infiltrate Electronically Signed   By: Inez Catalina M.D.   On: 10/07/2021 03:47   DG Chest Portable 1 View  Result Date: 09/26/2021 CLINICAL DATA:  Fall EXAM: PORTABLE CHEST 1  VIEW COMPARISON:  06/02/2021 FINDINGS: Right shoulder replacement. Borderline to mild cardiomegaly. No focal opacity or pleural effusion. No pneumothorax IMPRESSION: No active disease.  Borderline cardiomegaly Electronically Signed   By: Donavan Foil M.D.   On: 09/26/2021 20:57   DG Abd Portable 1V  Result Date: 10/07/2021 CLINICAL DATA:  NG tube placement EXAM: PORTABLE ABDOMEN - 1 VIEW COMPARISON:  09/27/2021 FINDINGS: There is mild dilation of few proximal small bowel loops, possibly suggesting ileus. There is contrast in the lumen of colon from previous examinations. Scattered diverticula are seen in the colon. No abnormal masses or calcifications are seen. Tip and side port in the NG tube are noted in the lumen of the stomach. There is increased density in the left lower lung fields suggesting pleural effusion and possibly underlying infiltrate. IMPRESSION: Tip of NG tube is seen in the stomach. There is mild dilation of few proximal small bowel loops, possibly suggesting ileus. Diverticulosis of colon. Left pleural effusion. Possible infiltrate in the left lower lung fields. Electronically Signed   By: Elmer Picker M.D.   On: 10/07/2021 12:04   DG Swallowing Func-Speech Pathology  Result Date: 09/29/2021 Table formatting from the original result was not included. Objective Swallowing Evaluation: Type of Study: MBS-Modified Barium Swallow Study  Patient Details Name: Sheryl [name] MRN: [id number] Date of Birth: Dec 22, 1950 Today's Date: 09/29/2021 Time: SLP Start Time (ACUTE ONLY): 89 -SLP Stop Time (ACUTE ONLY): 0350 SLP Time Calculation (min) (ACUTE ONLY): 13 min Past Medical History: Past Medical History: Diagnosis Date  ADD (attention deficit disorder)   Allergy   Arthritis   CKD (chronic kidney disease), stage III (HCC)   COPD (chronic obstructive pulmonary disease) ([place]) 12/09/2014  Depression   Does use hearing aid   Dyspnea   on exertion  Hyperlipidemia   Hypertension   LGSIL (low grade  squamous intraepithelial dysplasia)   Osteoporosis   Pre-diabetes  Past Surgical History: Past Surgical History: Procedure Laterality Date  JOINT REPLACEMENT    right knee  MULTIPLE TOOTH EXTRACTIONS    REPLACEMENT TOTAL KNEE  2013  right  REVERSE SHOULDER ARTHROPLASTY Right 03/30/2019  Procedure: REVERSE TOTAL SHOULDER ARTHROPLASTY;  Surgeon: Netta Cedars, MD;  Location: [facility];  Service: Orthopedics;  Laterality: Right;  interscalene block  TOTAL KNEE ARTHROPLASTY Left 12/27/2017  TOTAL KNEE ARTHROPLASTY Left 12/27/2017  Procedure: LEFT TOTAL KNEE ARTHROPLASTY;  Surgeon: Netta Cedars, MD;  Location: [facility];  Service: Orthopedics;  Laterality: Left; HPI: Pt is a 71 year old woman found down by her daughter in her home. 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 PMH: COPD, current smoker, HTN, HLD, prediabetes, CKD3, ADD, L TKA, R reverse TSA, depression, hearing loss.  Subjective: alert, cooperative  Recommendations for follow up therapy are one component of a multi-disciplinary discharge planning process, led by the attending physician.  Recommendations may be updated based on patient status, additional functional criteria and insurance authorization. Assessment / Plan / Recommendation Clinical Impressions 09/29/2021 Clinical Impression Pt  has a moderate oropharyngeal dysphagia with suspected esophageal component as observed during additional challenging in this repeat MBS. Oral deficits are similar to those noted on prior, with boluses spilling back into the pharynx as she works on clearing her oral cavity. Oral residue also remains after the swallow, as does vallecular residue due to reduced base of tongue retraction that is noticed more as consistencies become thicker. When pt self-regulates her pacing and bolus size, there is silent aspiration before/during the swallow with all liquid consistencies. It is not  as consistent with honey thick liquids, but she also has a small amount of silent aspiration after the swallow due to the increase in pharyngeal residue. Attempted a chin tuck to better contain boluses in the oral cavity prior to swallowing, but she does not maintain her head in a lowered position despite cues. When liquids were given via spoon, penetration reaches the true vocal folds with nectar thick liquids but is more shallow with honey thick liquids and mostly ejected spontaneously (one instance of PAS 3). Also of note, pt appeared to have barium that filled her esophagus during this study, reaching as high as the cerivcal esophagus but seemingly reduced after liquid washes (MD not present to confirm). Suspect that this could also be contributing to the build up of coughing that pt starts to exhibit across the course of a meal.  Recommend continuing Dys 1 diet but with honey thick liquids by spoon only. Would be very mindful of upright positioning and alternating solids/liquids, keeping pt up for at least 30 minutes after meals to try to facilitate oropharyngeal and suspected esophageal issues. SLP Visit Diagnosis Dysphagia, oropharyngeal phase (R13.12) Attention and concentration deficit following -- Frontal lobe and executive function deficit following -- Impact on safety and function Mild aspiration risk;Moderate aspiration risk   Treatment Recommendations 09/29/2021 Treatment Recommendations Therapy as outlined in treatment plan below   Prognosis 09/29/2021 Prognosis for Safe Diet Advancement Good Barriers to Reach Goals -- Barriers/Prognosis Comment -- Diet Recommendations 09/29/2021 SLP Diet Recommendations Dysphagia 1 (Puree) solids;Honey thick liquids Liquid Administration via Spoon Medication Administration Crushed with puree Compensations Slow rate;Small sips/bites;Minimize environmental distractions;Follow solids with liquid Postural Changes Remain semi-upright after after feeds/meals (Comment);Seated  upright at 90 degrees   Other Recommendations 09/29/2021 Recommended Consults Consider esophageal assessment Oral Care Recommendations Oral care BID Other Recommendations Order thickener from pharmacy;Prohibited food (jello, ice cream, thin soups);Remove water pitcher;Have oral suction available Follow Up Recommendations Acute inpatient rehab (3hours/day) Assistance recommended at discharge Frequent or constant Supervision/Assistance Functional Status Assessment Patient has had a recent decline in their functional status and demonstrates the ability to make significant improvements in function in a reasonable and predictable amount of time. Frequency and Duration  09/29/2021 Speech Therapy Frequency (ACUTE ONLY) min 2x/week Treatment Duration 2 weeks   Oral Phase 09/29/2021 Oral Phase Impaired Oral - Pudding Teaspoon -- Oral - Pudding Cup -- Oral - Honey Teaspoon Weak lingual manipulation;Reduced posterior propulsion;Lingual/palatal residue Oral - Honey Cup Reduced posterior propulsion;Decreased bolus cohesion;Weak lingual manipulation;Right anterior bolus loss;Lingual/palatal residue Oral - Nectar Teaspoon Weak lingual manipulation;Reduced posterior propulsion;Lingual/palatal residue Oral - Nectar Cup Reduced posterior propulsion;Decreased bolus cohesion;Weak lingual manipulation Oral - Nectar Straw Reduced posterior propulsion;Decreased bolus cohesion;Weak lingual manipulation Oral - Thin Teaspoon -- Oral - Thin Cup NT Oral - Thin Straw -- Oral - Puree Weak lingual manipulation;Reduced posterior propulsion;Lingual/palatal residue Oral - Mech Soft -- Oral - Regular -- Oral - Multi-Consistency -- Oral - Pill -- Oral Phase - Comment --  Pharyngeal Phase 09/29/2021 Pharyngeal Phase Impaired Pharyngeal- Pudding Teaspoon -- Pharyngeal -- Pharyngeal- Pudding Cup -- Pharyngeal -- Pharyngeal- Honey Teaspoon Penetration/Aspiration before swallow;Reduced tongue base retraction;Pharyngeal residue - valleculae Pharyngeal Material  enters airway, remains ABOVE vocal cords and not ejected out;Material enters airway, remains ABOVE vocal cords then ejected out Pharyngeal- Honey Cup Reduced tongue base retraction;Pharyngeal residue - valleculae;Penetration/Aspiration before swallow;Penetration/Apiration after swallow Pharyngeal Material enters airway, passes BELOW cords without attempt by patient to eject out (silent aspiration) Pharyngeal- Nectar Teaspoon Reduced tongue base retraction;Pharyngeal residue - valleculae;Penetration/Aspiration before swallow Pharyngeal Material enters airway, CONTACTS cords and not ejected out Pharyngeal- Nectar Cup Penetration/Aspiration during swallow;Pharyngeal residue - valleculae;Reduced tongue base retraction Pharyngeal Material enters airway, passes BELOW cords without attempt by patient to eject out (silent aspiration) Pharyngeal- Nectar Straw Penetration/Aspiration during swallow;Pharyngeal residue - valleculae;Compensatory strategies attempted (with notebox);Reduced tongue base retraction Pharyngeal Material enters airway, passes BELOW cords without attempt by patient to eject out (silent aspiration) Pharyngeal- Thin Teaspoon -- Pharyngeal -- Pharyngeal- Thin Cup NT Pharyngeal -- Pharyngeal- Thin Straw -- Pharyngeal -- Pharyngeal- Puree Reduced tongue base retraction;Pharyngeal residue - valleculae Pharyngeal -- Pharyngeal- Mechanical Soft -- Pharyngeal -- Pharyngeal- Regular -- Pharyngeal -- Pharyngeal- Multi-consistency -- Pharyngeal -- Pharyngeal- Pill -- Pharyngeal -- Pharyngeal Comment --  Cervical Esophageal Phase  09/27/2021 Cervical Esophageal Phase Impaired Pudding Teaspoon -- Pudding Cup -- Honey Teaspoon -- Honey Cup -- Nectar Teaspoon -- Nectar Cup -- Nectar Straw -- Thin Teaspoon NT Thin Cup Esophageal backflow into cervical esophagus Thin Straw NT Puree -- Mechanical Soft -- Regular -- Multi-consistency -- Pill -- Cervical Esophageal Comment mild amount of retrograde movement of bolus in  cervical esophagus Osie Bond., M.A. CCC-SLP Acute Rehabilitation Services Pager [phone] Office [phone] 09/29/2021, 2:39 PM                     DG Swallowing Func-Speech Pathology  Result Date: 09/27/2021 Table formatting from the original result was not included. Objective Swallowing Evaluation: Type of Study: MBS-Modified Barium Swallow Study  Patient Details Name: Sheryl [name] MRN: [id number] Date of Birth: [birth year] Today's Date: 09/27/2021 Time: SLP Start Time (ACUTE ONLY): 70 -SLP Stop Time (ACUTE ONLY): 4765 SLP Time Calculation (min) (ACUTE ONLY): 25 min Past Medical History: Past Medical History: Diagnosis Date  ADD (attention deficit disorder)   Allergy   Arthritis   CKD (chronic kidney disease), stage III (HCC)   COPD (chronic obstructive pulmonary disease) ([place]) 12/09/2014  Depression   Does use hearing aid   Dyspnea   on exertion  Hyperlipidemia   Hypertension   LGSIL (low grade squamous intraepithelial dysplasia)   Osteoporosis   Pre-diabetes  Past Surgical History: Past Surgical History: Procedure Laterality Date  JOINT REPLACEMENT    right knee  MULTIPLE TOOTH EXTRACTIONS    REPLACEMENT TOTAL KNEE  2013  right  REVERSE SHOULDER ARTHROPLASTY Right 03/30/2019  Procedure: REVERSE TOTAL SHOULDER ARTHROPLASTY;  Surgeon: Netta Cedars, MD;  Location: [facility];  Service: Orthopedics;  Laterality: Right;  interscalene block  TOTAL KNEE ARTHROPLASTY Left 12/27/2017  TOTAL KNEE ARTHROPLASTY Left 12/27/2017  Procedure: LEFT TOTAL KNEE ARTHROPLASTY;  Surgeon: Netta Cedars, MD;  Location: [facility];  Service: Orthopedics;  Laterality: Left; HPI: Pt is a 71 year old woman found down by her daughter in her home. 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 PMH: COPD, current smoker, HTN, HLD, prediabetes, CKD3, ADD,  L TKA, R reverse TSA, depression, hearing loss.  Subjective: alert, cooperative   Recommendations for follow up therapy are one component of a multi-disciplinary discharge planning process, led by the attending physician.  Recommendations may be updated based on patient status, additional functional criteria and insurance authorization. Assessment / Plan / Recommendation Clinical Impressions 09/27/2021 Clinical Impression Patient presents with a mild-moderate oropharyngeal dysphagia as per this MBS. During oral phase, patient exhibited decrased anterior to posterior transit of boluses which caused moderate delay with puree solids and mild delay with liquids. During pharyngeal phase, swallow was initiated at level of pyriform sinus with thin liquids and nectar thick liquids and at level of vallecular sinus with puree solids. Patient exhibited trace silent aspiration during the swallow with thin liquids and trace penetration with nectar thick liquids. Retention of barium observed in vallecular sinus with all tested consistencies (trace to mild with puree solids, trace with nectar and thin liquids). SLP is recommending to initiate Dys 1 (puree) solids and nectar thick liquids diet at this time. SLP Visit Diagnosis Dysphagia, oropharyngeal phase (R13.12) Attention and concentration deficit following -- Frontal lobe and executive function deficit following -- Impact on safety and function Mild aspiration risk;Moderate aspiration risk   Treatment Recommendations 09/27/2021 Treatment Recommendations Therapy as outlined in treatment plan below   Prognosis 09/27/2021 Prognosis for Safe Diet Advancement Good Barriers to Reach Goals -- Barriers/Prognosis Comment -- Diet Recommendations 09/27/2021 SLP Diet Recommendations Dysphagia 1 (Puree) solids;Nectar thick liquid Liquid Administration via Cup Medication Administration Crushed with puree Compensations Slow rate;Small sips/bites;Follow solids with liquid Postural Changes Seated upright at 90 degrees   Other Recommendations 09/27/2021 Recommended Consults -- Oral  Care Recommendations Oral care BID;Staff/trained caregiver to provide oral care;Oral care before and after PO Other Recommendations Order thickener from pharmacy;Prohibited food (jello, ice cream, thin soups);Clarify dietary restrictions;Remove water pitcher;Have oral suction available Follow Up Recommendations Acute inpatient rehab (3hours/day) Assistance recommended at discharge Frequent or constant Supervision/Assistance Functional Status Assessment Patient has had a recent decline in their functional status and demonstrates the ability to make significant improvements in function in a reasonable and predictable amount of time. Frequency and Duration  09/27/2021 Speech Therapy Frequency (ACUTE ONLY) min 2x/week Treatment Duration 1 week   Oral Phase 09/27/2021 Oral Phase Impaired Oral - Pudding Teaspoon -- Oral - Pudding Cup -- Oral - Honey Teaspoon -- Oral - Honey Cup -- Oral - Nectar Teaspoon -- Oral - Nectar Cup Reduced posterior propulsion;Delayed oral transit;Weak lingual manipulation Oral - Nectar Straw -- Oral - Thin Teaspoon -- Oral - Thin Cup Weak lingual manipulation;Delayed oral transit;Reduced posterior propulsion Oral - Thin Straw -- Oral - Puree Delayed oral transit;Reduced posterior propulsion;Weak lingual manipulation Oral - Mech Soft -- Oral - Regular -- Oral - Multi-Consistency -- Oral - Pill -- Oral Phase - Comment --  Pharyngeal Phase 09/27/2021 Pharyngeal Phase Impaired Pharyngeal- Pudding Teaspoon -- Pharyngeal -- Pharyngeal- Pudding Cup -- Pharyngeal -- Pharyngeal- Honey Teaspoon -- Pharyngeal -- Pharyngeal- Honey Cup -- Pharyngeal -- Pharyngeal- Nectar Teaspoon -- Pharyngeal -- Pharyngeal- Nectar Cup Delayed swallow initiation-vallecula;Delayed swallow initiation-pyriform sinuses;Penetration/Aspiration during swallow;Pharyngeal residue - valleculae Pharyngeal Material enters airway, remains ABOVE vocal cords then ejected out Pharyngeal- Nectar Straw -- Pharyngeal -- Pharyngeal- Thin Teaspoon --  Pharyngeal -- Pharyngeal- Thin Cup Delayed swallow initiation-pyriform sinuses;Reduced airway/laryngeal closure;Penetration/Aspiration during swallow;Trace aspiration;Pharyngeal residue - valleculae Pharyngeal Material enters airway, passes BELOW cords without attempt by patient to eject out (silent aspiration) Pharyngeal- Thin Straw -- Pharyngeal -- Pharyngeal- Puree Delayed swallow  initiation-vallecula;Reduced pharyngeal peristalsis;Pharyngeal residue - valleculae Pharyngeal -- Pharyngeal- Mechanical Soft -- Pharyngeal -- Pharyngeal- Regular -- Pharyngeal -- Pharyngeal- Multi-consistency -- Pharyngeal -- Pharyngeal- Pill -- Pharyngeal -- Pharyngeal Comment --  Cervical Esophageal Phase  09/27/2021 Cervical Esophageal Phase Impaired Pudding Teaspoon -- Pudding Cup -- Honey Teaspoon -- Honey Cup -- Nectar Teaspoon -- Nectar Cup -- Nectar Straw -- Thin Teaspoon NT Thin Cup Esophageal backflow into cervical esophagus Thin Straw NT Puree -- Mechanical Soft -- Regular -- Multi-consistency -- Pill -- Cervical Esophageal Comment mild amount of retrograde movement of bolus in cervical esophagus Sonia Baller, MA, CCC-SLP Speech Therapy                     ECHOCARDIOGRAM COMPLETE  Result Date: 09/28/2021    ECHOCARDIOGRAM REPORT   Patient Name:   Sheryl [name] Date of Exam: 09/28/2021 Medical Rec #:  [id number]       Height:       61.0 in Accession #:    [id number]      Weight:       141.5 lb Date of Birth:  1950-10-31      BSA:          1.631 m Patient Age:    14 years        BP:           143/65 mmHg Patient Gender: F               HR:           84 bpm. Exam Location:  Inpatient Procedure: 2D Echo Indications:    Stroke  History:        Patient has no prior history of Echocardiogram examinations.                 Signs/Symptoms:Shortness of Breath; Risk Factors:Dyslipidemia                 and Hypertension.  Sonographer:    Arlyss Gandy Referring Phys: [place]  1. Left ventricular ejection fraction,  by estimation, is 60 to 65%. The left ventricle has normal function. The left ventricle has no regional wall motion abnormalities. Left ventricular diastolic parameters were normal.  2. Right ventricular systolic function is normal. The right ventricular size is normal.  3. The mitral valve is grossly normal. Mild mitral valve regurgitation. No evidence of mitral stenosis.  4. The aortic valve is grossly normal. Aortic valve regurgitation is not visualized. No aortic stenosis is present.  5. The inferior vena cava is normal in size with greater than 50% respiratory variability, suggesting right atrial pressure of 3 mmHg. Comparison(s): No prior Echocardiogram. Conclusion(s)/Recommendation(s): Normal biventricular function without evidence of hemodynamically significant valvular heart disease. No intracardiac source of embolism detected on this transthoracic study. Consider a transesophageal echocardiogram to exclude cardiac source of embolism if clinically indicated. FINDINGS  Left Ventricle: Left ventricular ejection fraction, by estimation, is 60 to 65%. The left ventricle has normal function. The left ventricle has no regional wall motion abnormalities. The left ventricular internal cavity size was normal in size. There is  no left ventricular hypertrophy. Left ventricular diastolic parameters were normal. Right Ventricle: The right ventricular size is normal. Right vetricular wall thickness was not well visualized. Right ventricular systolic function is normal. Left Atrium: Left atrial size was normal in size. Right Atrium: Right atrial size was normal in size. Pericardium: There is no evidence of pericardial effusion. Mitral Valve: The  mitral valve is grossly normal. Mild mitral valve regurgitation. No evidence of mitral valve stenosis. Tricuspid Valve: The tricuspid valve is normal in structure. Tricuspid valve regurgitation is trivial. No evidence of tricuspid stenosis. Aortic Valve: The aortic valve is  grossly normal. Aortic valve regurgitation is not visualized. No aortic stenosis is present. Aortic valve mean gradient measures 3.0 mmHg. Aortic valve peak gradient measures 6.6 mmHg. Aortic valve area, by VTI measures 2.45 cm. Pulmonic Valve: The pulmonic valve was not well visualized. Pulmonic valve regurgitation is not visualized. No evidence of pulmonic stenosis. Aorta: The aortic root, ascending aorta, aortic arch and descending aorta are all structurally normal, with no evidence of dilitation or obstruction. Venous: The inferior vena cava is normal in size with greater than 50% respiratory variability, suggesting right atrial pressure of 3 mmHg. IAS/Shunts: The atrial septum is grossly normal.  LEFT VENTRICLE PLAX 2D LVIDd:         3.70 cm   Diastology LVIDs:         2.50 cm   LV e' medial:    8.27 cm/s LV PW:         1.00 cm   LV E/e' medial:  9.0 LV IVS:        1.10 cm   LV e' lateral:   11.60 cm/s LVOT diam:     2.00 cm   LV E/e' lateral: 6.4 LV SV:         63 LV SV Index:   39 LVOT Area:     3.14 cm  RIGHT VENTRICLE             IVC RV Basal diam:  2.90 cm     IVC diam: 1.80 cm RV Mid diam:    2.70 cm RV S prime:     11.10 cm/s TAPSE (M-mode): 2.0 cm LEFT ATRIUM             Index        RIGHT ATRIUM           Index LA diam:        3.00 cm 1.84 cm/m   RA Area:     10.40 cm LA Vol (A2C):   43.0 ml 26.37 ml/m  RA Volume:   20.70 ml  12.69 ml/m LA Vol (A4C):   24.8 ml 15.21 ml/m LA Biplane Vol: 35.8 ml 21.95 ml/m  AORTIC VALVE AV Area (Vmax):    2.00 cm AV Area (Vmean):   2.33 cm AV Area (VTI):     2.45 cm AV Vmax:           128.00 cm/s AV Vmean:          77.800 cm/s AV VTI:            0.258 m AV Peak Grad:      6.6 mmHg AV Mean Grad:      3.0 mmHg LVOT Vmax:         81.60 cm/s LVOT Vmean:        57.800 cm/s LVOT VTI:          0.201 m LVOT/AV VTI ratio: 0.78  AORTA Ao Root diam: 2.70 cm Ao Asc diam:  3.10 cm MITRAL VALVE MV Area (PHT): 4.17 cm    SHUNTS MV Decel Time: 182 msec    Systemic VTI:   0.20 m MV E velocity: 74.10 cm/s  Systemic Diam: 2.00 cm MV A velocity: 79.30 cm/s MV E/A ratio:  0.93 Buford Dresser MD Electronically  signed by Buford Dresser MD Signature Date/Time: 09/28/2021/11:36:55 AM    Final    VAS US CAROTID  Result Date: 09/27/2021 Carotid Arterial Duplex Study Patient Name:  Sheryl [name]  Date of Exam:   09/27/2021 Medical Rec #: [id number]        Accession #:    [id number] Date of Birth: Jan 06, 1951       Patient Gender: F Patient Age:   35 years Exam Location:  [facility] Procedure:      VAS US CAROTID Referring Phys: Aldona Bar RHYNE --------------------------------------------------------------------------------  Risk Factors:      Hypertension, hyperlipidemia. Comparison Study:  09/26/2021 - CTA neck:                     1. The left common and internal carotid arteries are patent                    within                    the neck. Prominent atherosclerotic plaque about the left                    carotid                    bifurcation and within the proximal ICA. Exact quantification                    of                    stenosis at the origin of the left ICA is difficult due to                    blooming                    artifact from calcified plaque at this site, but appears                    greater                    than 80%. Consider correlation with a carotid artery duplex                    ultrasound.                    2. The right common and internal carotid arteries are patent                    within                    the neck without hemodynamically significant stenosis. Mild                    atherosclerotic plaque within the right carotid system within                    the                    neck, as described.                    3. Vertebral arteries patent within the neck without                    significant  stenosis.                    4. Aortic Atherosclerosis (ICD10-I70.0) and Emphysema                     (ICD10-J43.9). Performing Technologist: Oliver Hum RVT  Examination Guidelines: A complete evaluation includes B-mode imaging, spectral Doppler, color Doppler, and power Doppler as needed of all accessible portions of each vessel. Bilateral testing is considered an integral part of a complete examination. Limited examinations for reoccurring indications may be performed as noted.  Right Carotid Findings: +----------+--------+--------+--------+-----------------------+--------+             PSV cm/s EDV cm/s Stenosis Plaque Description      Comments  +----------+--------+--------+--------+-----------------------+--------+  CCA Prox   90       19                smooth and heterogenous           +----------+--------+--------+--------+-----------------------+--------+  CCA Distal 73       21                smooth and heterogenous           +----------+--------+--------+--------+-----------------------+--------+  ICA Prox   74       22                calcific                          +----------+--------+--------+--------+-----------------------+--------+  ICA Distal 111      38                                        tortuous  +----------+--------+--------+--------+-----------------------+--------+  ECA        97       12                                                  +----------+--------+--------+--------+-----------------------+--------+ +----------+--------+-------+--------+-------------------+             PSV cm/s EDV cms Describe Arm Pressure (mmHG)  +----------+--------+-------+--------+-------------------+  Subclavian 150                                            +----------+--------+-------+--------+-------------------+ +---------+--------+--+--------+--+---------+  Vertebral PSV cm/s 88 EDV cm/s 17 Antegrade  +---------+--------+--+--------+--+---------+  Left Carotid Findings: +----------+-------+-------+--------+---------------------------------+--------+             PSV     EDV     Stenosis Plaque  Description                Comments              cm/s    cm/s                                                         +----------+-------+-------+--------+---------------------------------+--------+  CCA Prox   71  10               smooth and heterogenous                     +----------+-------+-------+--------+---------------------------------+--------+  CCA Distal 55      5                smooth and heterogenous                     +----------+-------+-------+--------+---------------------------------+--------+  ICA Prox   413     127     80-99%   calcific, irregular and                                                          heterogenous                                +----------+-------+-------+--------+---------------------------------+--------+  ICA Mid    157     33                                                           +----------+-------+-------+--------+---------------------------------+--------+  ICA Distal 63      19                                                 tortuous  +----------+-------+-------+--------+---------------------------------+--------+  ECA        166     18                                                           +----------+-------+-------+--------+---------------------------------+--------+ +----------+--------+--------+--------+-------------------+             PSV cm/s EDV cm/s Describe Arm Pressure (mmHG)  +----------+--------+--------+--------+-------------------+  Subclavian 134                                             +----------+--------+--------+--------+-------------------+ +---------+--------+--+--------+--+---------+  Vertebral PSV cm/s 73 EDV cm/s 17 Antegrade  +---------+--------+--+--------+--+---------+   Summary: Right Carotid: Velocities in the right ICA are consistent with a 1-39% stenosis. Left Carotid: Velocities in the left ICA are consistent with a 80-99% stenosis. Vertebrals: Bilateral vertebral arteries demonstrate antegrade flow. *See table(s)  above for measurements and observations.  Electronically signed by Harold Barban MD on 09/27/2021 at 8:54:19 PM.    Final    [place] EKG SITE RITE  Result Date: 10/08/2021 If Site Rite image not attached, placement could not be confirmed due to current cardiac rhythm.   Assessment: 71 y.o. female PMH of HTN, HLD, COPD, CKD, pre-DM, smoker and recent stroke admitted from SNF for SOB with aspiration, needed  intubation and admitted to ICU. She had stroke earlier this month for large left BG/CR infarct. CTA head and neck showed left ICA 80% stenosis, left M1 and M2 branch occlusion. CUS left ICA 80-99% stenosis. [facility]VS consulted and plan to do TCAR in 2 weeks of time. However, repeat MRI today showed left MCA infarct extension.   I think she probably had left MCA infarct extension in the SNF which worsened dysphagia that led to aspiration pneumonia followed by respiratory failure. Not sure if she had hypotension in SNF which could compromise the left brain perfusion. Now she is on levo, will recommend BP goal 130-160. 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   Will still recommend CTA head and neck if able. However, doubt she will be good candidate at this time for any carotid revascularization.   Plan: Continue ICU care. Close neuro check CTA head and neck when able BP goal 130-160 for brain perfusion given left ICA high grade stenosis and left M1 and M2 occlusion. Now on pressor.  Continue DAPT and statin Vent and pneumonia management per CCM. Extubate as able.  Further evaluation as per inpatient neurology recommendations  Discussed with Dr. [name] CCM Will follow  This patient is critically ill due to respiratory failure, s/p intubation, hypotension on pressor, left MCA stroke extension, left ICA stenosis with left M1 occlusion and at significant risk of neurological worsening, death form recurrent stroke, hemorrhagic conversion, respiratory failure, heart failure,  seizure, sepsis. This patient's care requires constant monitoring of vital signs, hemodynamics, respiratory and cardiac monitoring, review of multiple databases, neurological assessment, discussion with family, other specialists and medical decision making of high complexity. I spent 40 minutes of neurocritical care time in the care of this patient. I discussed with Dr. [name].   Rosalin Hawking, MD PhD Stroke Neurology 10/08/2021 6:22 PM

## 2021-10-08 NOTE — Progress Notes (Signed)
Pt transported to and from MRI on the ventilator without incident. 

## 2021-10-08 NOTE — Progress Notes (Signed)
BUE assessed for PICC placement.  All vessels too small for placement- not suitable.  All vessels measure 61-100% PICC occupancy.  [facility] notified.  Assessed by 2 PICC nurses, self and Quin Hoop RN.

## 2021-10-09 ENCOUNTER — Inpatient Hospital Stay (HOSPITAL_COMMUNITY): Payer: Medicare PPO

## 2021-10-09 DIAGNOSIS — A419 Sepsis, unspecified organism: Principal | ICD-10-CM

## 2021-10-09 DIAGNOSIS — E782 Mixed hyperlipidemia: Secondary | ICD-10-CM

## 2021-10-09 DIAGNOSIS — R6521 Severe sepsis with septic shock: Secondary | ICD-10-CM | POA: Diagnosis not present

## 2021-10-09 DIAGNOSIS — G9341 Metabolic encephalopathy: Secondary | ICD-10-CM

## 2021-10-09 DIAGNOSIS — J9621 Acute and chronic respiratory failure with hypoxia: Secondary | ICD-10-CM | POA: Diagnosis not present

## 2021-10-09 DIAGNOSIS — J690 Pneumonitis due to inhalation of food and vomit: Secondary | ICD-10-CM | POA: Diagnosis not present

## 2021-10-09 DIAGNOSIS — N179 Acute kidney failure, unspecified: Secondary | ICD-10-CM

## 2021-10-09 DIAGNOSIS — I6529 Occlusion and stenosis of unspecified carotid artery: Secondary | ICD-10-CM

## 2021-10-09 DIAGNOSIS — I952 Hypotension due to drugs: Secondary | ICD-10-CM

## 2021-10-09 LAB — BPAM RBC
Blood Product Expiration Date: 202303232359
ISSUE DATE / TIME: 202302192259
Unit Type and Rh: 5100

## 2021-10-09 LAB — BASIC METABOLIC PANEL
Anion gap: 10 (ref 5–15)
BUN: 19 mg/dL (ref 8–23)
CO2: 25 mmol/L (ref 22–32)
Calcium: 8.4 mg/dL — ABNORMAL LOW (ref 8.9–10.3)
Chloride: 105 mmol/L (ref 98–111)
Creatinine, Ser: 1.26 mg/dL — ABNORMAL HIGH (ref 0.44–1.00)
GFR, Estimated: 46 mL/min — ABNORMAL LOW (ref >60)
Glucose, Bld: 135 mg/dL — ABNORMAL HIGH (ref 70–99)
Potassium: 3.8 mmol/L (ref 3.5–5.1)
Sodium: 140 mmol/L (ref 135–145)

## 2021-10-09 LAB — CBC
HCT: 25.6 % — ABNORMAL LOW (ref 36.0–46.0)
HCT: 25 % — ABNORMAL LOW (ref 36.0–46.0)
Hemoglobin: 8.1 g/dL — ABNORMAL LOW (ref 12.0–15.0)
Hemoglobin: 7.9 g/dL — ABNORMAL LOW (ref 12.0–15.0)
MCH: 28.4 pg (ref 26.0–34.0)
MCH: 28.1 pg (ref 26.0–34.0)
MCHC: 31.6 g/dL (ref 30.0–36.0)
MCHC: 31.6 g/dL (ref 30.0–36.0)
MCV: 89.8 fL (ref 80.0–100.0)
MCV: 89 fL (ref 80.0–100.0)
Platelets: 366 10*3/uL (ref 150–400)
Platelets: 391 10*3/uL (ref 150–400)
RBC: 2.85 MIL/uL — ABNORMAL LOW (ref 3.87–5.11)
RBC: 2.81 MIL/uL — ABNORMAL LOW (ref 3.87–5.11)
RDW: 15.1 % (ref 11.5–15.5)
RDW: 14.9 % (ref 11.5–15.5)
WBC: 20.6 10*3/uL — ABNORMAL HIGH (ref 4.0–10.5)
WBC: 21 10*3/uL — ABNORMAL HIGH (ref 4.0–10.5)
nRBC: 0 % (ref 0.0–0.2)
nRBC: 0 % (ref 0.0–0.2)

## 2021-10-09 LAB — GLUCOSE, CAPILLARY
Glucose-Capillary: 136 mg/dL — ABNORMAL HIGH (ref 70–99)
Glucose-Capillary: 90 mg/dL (ref 70–99)
Glucose-Capillary: 91 mg/dL (ref 70–99)
Glucose-Capillary: 107 mg/dL — ABNORMAL HIGH (ref 70–99)
Glucose-Capillary: 122 mg/dL — ABNORMAL HIGH (ref 70–99)
Glucose-Capillary: 140 mg/dL — ABNORMAL HIGH (ref 70–99)

## 2021-10-09 LAB — PROCALCITONIN: Procalcitonin: 4.73 ng/mL

## 2021-10-09 LAB — TYPE AND SCREEN
ABO/RH(D): O POS
Antibody Screen: NEGATIVE
Unit division: 0

## 2021-10-09 LAB — MAGNESIUM
Magnesium: 2.2 mg/dL (ref 1.7–2.4)
Magnesium: 2.1 mg/dL (ref 1.7–2.4)

## 2021-10-09 LAB — PHOSPHORUS
Phosphorus: 4.6 mg/dL (ref 2.5–4.6)
Phosphorus: 4.9 mg/dL — ABNORMAL HIGH (ref 2.5–4.6)

## 2021-10-09 MED ORDER — FENTANYL CITRATE PF 50 MCG/ML IJ SOSY
25.0000 ug | PREFILLED_SYRINGE | INTRAMUSCULAR | Status: DC | PRN
  Administered 2021-10-09: 50 ug via INTRAVENOUS
  Filled 2021-10-09 (×2): qty 1

## 2021-10-09 MED ORDER — PROSOURCE TF PO LIQD
45.0000 mL | Freq: Two times a day (BID) | ORAL | Status: DC
  Administered 2021-10-09 – 2021-10-10 (×3): 45 mL
  Filled 2021-10-09 (×3): qty 45

## 2021-10-09 MED ORDER — DEXMEDETOMIDINE HCL IN NACL 400 MCG/100ML IV SOLN
0.2000 ug/kg/h | INTRAVENOUS | Status: DC
  Administered 2021-10-09: 19:00:00 0.5 ug/kg/h via INTRAVENOUS
  Administered 2021-10-09: 09:00:00 0.4 ug/kg/h via INTRAVENOUS
  Administered 2021-10-10: 0.6 ug/kg/h via INTRAVENOUS
  Filled 2021-10-09 (×3): qty 100

## 2021-10-09 MED ORDER — VITAL HIGH PROTEIN PO LIQD
1000.0000 mL | ORAL | Status: DC
  Administered 2021-10-09 – 2021-10-10 (×2): 1000 mL

## 2021-10-09 MED ORDER — FENTANYL CITRATE PF 50 MCG/ML IJ SOSY: 25.0000 ug | PREFILLED_SYRINGE | INTRAMUSCULAR | Status: DC | PRN

## 2021-10-09 NOTE — Progress Notes (Addendum)
STROKE TEAM PROGRESS NOTE   INTERVAL HISTORY Patient is seen in her room with her RN at the bedside.  She continues to require norepinephrine to maintain her blood pressure goals.  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  She was discharged to a SNF and re-admitted with pneumonia and sepsis.  While in the ICU, she was found to have anisocoria.  Repeat MRI revealed extension of her left MCA infarct.   Vitals:   10/09/21 0845 10/09/21 0900 10/09/21 0915 10/09/21 0930  BP:  (!) 107/55    Pulse: (!) 112 (!) 113 (!) 112 (!) 101  Resp: 11 (!) 9 10 17   Temp: 99.3 F (37.4 C) 99.5 F (37.5 C) 99.5 F (37.5 C) 99.7 F (37.6 C)  TempSrc:      SpO2: (!) 89% 92% 93% (!) 78%  Weight:      Height:       CBC:  Recent Labs  Lab 10/07/21 0340 10/07/21 0442 10/08/21 0305 10/08/21 0310 10/08/21 2125 10/09/21 0312  WBC 20.1*  --  20.6*  --   --  20.6*  NEUTROABS 17.5*  --   --   --   --   --   HGB 10.0*   < > 7.4*   < > 6.5* 8.1*  HCT 32.5*   < > 23.7*   < > 20.9* 25.6*  MCV 89.8  --  89.8  --   --  89.8  PLT 450*  --  376  --   --  366   < > = values in this interval not displayed.   Basic Metabolic Panel:  Recent Labs  Lab 10/08/21 0305 10/08/21 0310 10/08/21 2049 10/09/21 0312  NA 139   < > 142 140  K 3.6   < > 4.0 3.8  CL 104  --   --  105  CO2 25  --   --  25  GLUCOSE 130*  --   --  135*  BUN 26*  --   --  19  CREATININE 1.21*  --   --  1.26*  CALCIUM 8.1*  --   --  8.4*   < > = values in this interval not displayed.   Lipid Panel:  Recent Labs  Lab 10/08/21 0305  TRIG 71   HgbA1c: No results for input(s): HGBA1C in the last 168 hours. Urine Drug Screen: No results for input(s): LABOPIA, COCAINSCRNUR, LABBENZ, AMPHETMU, THCU, LABBARB in the last 168 hours.  Alcohol Level No results for input(s): ETH in the last 168 hours.  IMAGING past 24 hours CT ANGIO HEAD NECK  W WO CM  Result Date: 10/09/2021 CLINICAL DATA:  Follow-up examination for stroke. EXAM: CT ANGIOGRAPHY HEAD AND NECK TECHNIQUE: Multidetector CT imaging of the head and neck was performed using the standard protocol during bolus administration of intravenous contrast. Multiplanar CT image reconstructions and MIPs were obtained to evaluate the vascular anatomy. Carotid stenosis measurements (when applicable) are obtained utilizing NASCET criteria, using the distal internal carotid diameter as the denominator. RADIATION DOSE REDUCTION: This exam was performed according to the departmental dose-optimization program which includes automated exposure control, adjustment of the mA and/or kV according to patient size and/or use of iterative reconstruction technique. CONTRAST:  154mL OMNIPAQUE IOHEXOL 350 MG/ML SOLN COMPARISON:  CTA from 09/26/2021 as well as additional studies. FINDINGS: CT HEAD FINDINGS Brain: Large area  of cytotoxic edema related to the acute left MCA distribution infarct again seen, stable in size and distribution. No evidence for hemorrhagic transformation. Mild localized mass effect without significant midline shift. No other new infarct or hemorrhage. No hydrocephalus or trapping. No extra-axial fluid collection or visible mass lesion. Vascular: No hyperdense vessel. Skull: No new finding. Sinuses: Clear. Orbits: Unremarkable. Review of the MIP images confirms the above findings CTA NECK FINDINGS Aortic arch: Visualized aortic arch normal caliber with normal 3 vessel morphology. Mild atheromatous change about the origin of the great vessels without hemodynamically significant stenosis. Right carotid system: Right CCA patent from its origin to the bifurcation without stenosis. Mild calcified plaque about the right carotid bulb/proximal right ICA without significant stenosis. Right ICA tortuous but widely patent distally without stenosis or dissection. Left carotid system: Left CCA remains patent to  the bifurcation without stenosis. Bulky calcified plaque about the left carotid bulb/proximal left ICA with associated severe approximate 80% stenosis by NASCET criteria, grossly stable from prior. Left ICA somewhat diminutive but patent distally without stenosis or dissection. Vertebral arteries: Both vertebral arteries arise from the subclavian arteries. Vertebral arteries mildly tortuous but are widely patent without stenosis or dissection. Skeleton: No discrete or worrisome osseous lesions. Other neck: Endotracheal and enteric tubes in place. 5 mm calcified left thyroid nodule noted, of doubtful significance given size and patient age, no follow-up imaging recommended (ref: J Am Coll Radiol. 2015 Feb;12(2): 143-50). Upper chest: Interval development of multifocal airspace disease involving the visualized lungs, concerning for multifocal pneumonia. Underlying emphysema. Few scattered prominent mediastinal lymph nodes measure up to 1.1 cm, which could be reactive. Review of the MIP images confirms the above findings CTA HEAD FINDINGS Anterior circulation: Petrous segments remain patent. Atheromatous change within the carotid siphons without hemodynamically significant stenosis. A1 segments remain patent. Normal anterior communicating artery complex. Both ACAs remain patent to their distal aspects. Right M1 segment and distal right MCA branches remain patent well perfused. Proximal left M1 occlusion again seen. Some distal reconstitution again seen within the left MCA branches distally. However, there has been interval occlusion of a left M2 branch at the site of previously identified subocclusive thrombus. Decreased perfusion now seen within the infarcted left MCA territory as compared to prior. Posterior circulation: Both vertebral arteries remain patent to the vertebrobasilar junction. Both PICA remain patent. Basilar remains patent to its distal aspect. Superior cerebellar arteries remain patent as do the PCAs  bilaterally. Venous sinuses: Patent allowing for timing the contrast bolus. Anatomic variants: As above. Review of the MIP images confirms the above findings IMPRESSION: CT HEAD IMPRESSION: 1. Continued interval evolution of large left MCA distribution infarct, stable from recent MRI. No hemorrhagic transformation or significant midline shift at this time. 2. No other new acute intracranial abnormality. CTA HEAD AND NECK IMPRESSION: 1. Persistent proximal left M1 occlusion, similar to previous. However, there has been interval occlusion of a downstream left M2 branch, with decreased perfusion within the infarcted left MCA territory as compared to previous. 2. Severe approximate 80% stenosis about the left carotid bulb/proximal cervical left ICA, similar to previous. 3. Additional mild atheromatous change elsewhere about the major arterial vasculature of the head and neck. No other hemodynamically significant stenosis. No other new or progressive finding. 4. Multifocal airspace disease involving the visualized lungs, concerning for multifocal pneumonia. 5.  Emphysema (ICD10-J43.9). Electronically Signed   By: Jeannine Boga M.D.   On: 10/09/2021 02:46   MR BRAIN WO CONTRAST  Result Date: 10/08/2021  CLINICAL DATA:  Anisocoria new anisocoria, recent stroke, ICA stenosis EXAM: MRI HEAD WITHOUT CONTRAST TECHNIQUE: Multiplanar, multiecho pulse sequences of the brain and surrounding structures were obtained without intravenous contrast. COMPARISON:  MRI September 27, 2021 FINDINGS: Brain: Large acute left MCA territory infarct. The extent of acute infarct is increased relative to February 2023 MRI. In addition to the evolving left basal ganglia infarct, no now is acute or subacute large/confluent infarct involving the left frontal lobe, parietal lobe, anterior temporal lobe, and insula. Associated edema in these regions with trace (1-2 mm) rightward midline shift. Mild linear susceptibility artifact in these  regions with some areas of faint T1 hyperintensity may represent mild petechial hemorrhage. No mass occupying acute hemorrhage. No hydrocephalus, mass lesion, or extra-axial fluid collection. Vascular: The left MCA flow void is absent, compatible with M1 MCA occlusion seen on prior CTA. Skull and upper cervical spine: Normal marrow signal. Sinuses/Orbits: Clear sinuses.  Unremarkable orbits. Other: Trace mastoid effusions. IMPRESSION: 1. Large acute/subacute left MCA territory infarct. The extent of infarct is increased relative to February 8 MRI. In addition to the evolving left basal ganglia infarct, there now is acute or subacute large/confluent infarct involving the left frontal, parietal, and anterior temporal lobes, and insula. 2. Associated edema with trace (1-2 mm) rightward midline shift. 3. Probable mild petechial hemorrhage without mass occupying hemorrhagic transformation. 4. The left MCA flow void is absent, compatible with M1 MCA occlusion seen on prior CTA. These results will be called to the ordering clinician or representative by the Radiologist Assistant, and communication documented in the PACS or [employer]. Electronically Signed   By: Margaretha Sheffield M.D.   On: 10/08/2021 16:19   DG Chest Port 1 View  Result Date: 10/09/2021 CLINICAL DATA:  Hypoxia EXAM: PORTABLE CHEST 1 VIEW COMPARISON:  Chest radiograph 1 day prior FINDINGS: Endotracheal tube tip is approximately 2.2 cm from the carina. The enteric catheter tip is off the field of view. The cardiomediastinal silhouette is stable. Diffuse coarsened interstitial opacities throughout both lungs have overall worsened in the interim, with worsened focal airspace opacity in the right lower lobe. There is no significant pleural effusion. There is no pneumothorax. The bones are stable. IMPRESSION: Worsened interstitial opacities throughout both lungs and focal airspace opacity in the right lower lobe since 10/08/2021. Findings again may  reflect edema, infection, or combination. Electronically Signed   By: Valetta Mole M.D.   On: 10/09/2021 08:03   [place] EKG SITE RITE  Result Date: 10/08/2021 If Site Rite image not attached, placement could not be confirmed due to current cardiac rhythm.   PHYSICAL EXAM General:  Intubated, sedated, well-developed patient in no acute distress Respiratory: Respirations synchronous with ventilator Cardiovascular:  Regular rhythm with tachycardia seen on monitor Neurological (on sedation with propofol and fentanyl):  Pupils 6mm and reactive to light, brisk on left and sluggish on right.  Corneal reflex absent on right.  Cough and gag reflexes present.  Will move BUE and LLE nonpurposefully, no movement of RLE to noxious stimulation.  Does not follow commands.  ASSESSMENT/PLAN Ms. Sheryl [name] is a 71 y.o. female with history of HTN, HLD, COPD, smoking, pre-DM, CKD and recent stroke who was initially admitted on 2/7 with right sided weakness and dysarthria.  She was found to have a left MCA stroke with 80-99% left ICA stenosis.  She was discharged to a SNF and re-admitted with pneumonia and sepsis.  While in the ICU, she was found to have anisocoria.  Repeat MRI revealed extension of her left MCA infarct.   Stroke extension:  left MCA infarct extension likely secondary left MCA occlusion and left ICA high grade stenosis with failed collaterals CT head edematous acute to subacute infarction in left basal ganglia and corona radiata CTA head & neck proximal left M1 occlusion with interval occlusion of downstream M2 branch, 80% stenosis of left carotid bulb/ proximal cervical ICA MRI  extension of left MCA territory infarct with acute or subacute infarct involving left frontal, parietal, interior temporal lobes and insula, trace rightward midline shift and mild petechial hemorrhage Carotid Doppler  1-39% stenosis of right ICA, 80-99% stenosis of left ICA 2D Echo EF 60-65%, normal atrial septum LDL  87 HgbA1c 5.5 VTE prophylaxis - lovenox aspirin 325 mg daily and clopidogrel 75 mg daily prior to admission, now on aspirin 325 mg daily and clopidogrel 75 mg daily.  Therapy recommendations:  pending Disposition:  pending  Hx of recent stroke 09/26/2021 she was admitted for right sided weakness and dysarthria, found down at home. CT showed subacute left BG, CR and caudate infarcts. CTA head and neck showed left ICA 80% stenosis, left M1 and M2 branch occlusion. MRI showed large left BG/CR infarct. CUS left ICA 80-99% stenosis. EF 60-65%, LDL 87 and A1C 5.5. VVS consulted and plan to do TCAR in 2 weeks of time. She was discharged to SNF with DAPT and crestor 40. Recommend BP goal 130-160 before carotid procedure  Left ICA severe stenosis 80% stenosis of left ICA seen on CTA Unable to undergo intervention for this at this time due to instability Keep SBP 130-160  Pneumonia/Septic shock Patient was admitted with worsening dyspnea and possible aspiration pneumonia Norepinephrine to keep SBP 130-160 Antibiotics per primary team  Hypertension, now hypotensive Home meds:  none Unstable SBP goal 130-160 Norepinephrine as needed to maintain BP goal  Hyperlipidemia Home meds:  rosuvastatin 20 mg daily, increased to 40 mg daily in hospital LDL 87, goal < 70 Continue statin at discharge  Respiratory failure Patient required intubation due to respiratory failure shortly after admission Continue antibiotics per primary team Ventilator management per primary team  Other Stroke Risk Factors Advanced Age >/= 22  Cigarette smoker will advise to stop smoking Hx stroke   Other Active Problems AKI Avoid contrast when possible Renally dose medications as appropriate  Hospital day # [place] , MSN, AGACNP-BC [facility] See Amion for schedule and pager information 10/09/2021 11:11 AM   ATTENDING NOTE: I reviewed above note and agree with the assessment and  plan. Pt was seen and examined.   RN at bedside.  No family at bedside.  Patient is due intubated, off propofol and fentanyl, now on Precedex.  Still on Levophed for BP management.  On exam, patient still intubated off sedation, eyes open on voice, however not quite following commands. Eyes in mid position, not blinking to visual threat on the right, intermittently blinking to threat on the left, not tracking bilaterally today, however pupil equal size today. Gag and cough present. Breathing  over the vent.  Facial symmetry not able to test due to ET tube.  Tongue protrusion not cooperative. On pain stimulation, slight withdrawal on the left upper and lower extremity, hemiplegia on the right. DTR diminished and no babinski. Sensation, coordination and gait not tested.  Repeat MRI yesterday showed left MCA stroke extension likely due to failed collateral flow in the setting of left M1 occlusion and left MCA high-grade  stenosis.  Currently not candidate for carotid revascularization.  Chest x-ray today showed worsening interstitial edema, infection or combination.  Currently on Zosyn, CCM managing.  Continue aspirin Plavix and statin.  BP goal 1 30-1 60 given large vessel occlusion/stenosis, Levophed PRN.  We will follow-up  For detailed assessment and plan, please refer to above as I have made changes wherever appropriate.   Rosalin Hawking, MD PhD Stroke Neurology 10/09/2021 12:19 PM  This patient is critically ill due to large MCA stroke on the left with extension, left M1 occlusion, left ICA high-grade stenosis, respite failure on ventilation, septic shock with pneumonia, hypotension on pressor and at significant risk of neurological worsening, death form stroke extension, hemorrhagic transformation, septic shock, heart failure, respiratory failure, renal failure. This patient's care requires constant monitoring of vital signs, hemodynamics, respiratory and cardiac monitoring, review of multiple  databases, neurological assessment, discussion with family, other specialists and medical decision making of high complexity. I spent 35 minutes of neurocritical care time in the care of this patient.    To contact Stroke Continuity provider, please refer to [email or web address] After hours, contact General Neurology

## 2021-10-09 NOTE — Progress Notes (Incomplete)
NAME:  Sheryl [name], MRN:  [id number], DOB:  Jan 12, 1951, LOS: 2 ADMISSION DATE:  10/07/2021, CONSULTATION DATE:  *** REFERRING MD:  ***, CHIEF COMPLAINT:  ***   History of Present Illness:  41 yof arrive to [facility] ED from [facility] via EMS with gradually worsening dyspnea over the past several days increased WOB and tachycardia in the setting of newly diagnosed pneumonia. At the facility was started on Levaquin. Per patient SOB was gradual onset over the past several days has increasing SOB while lying down. Required an increase in her baseline from 2L to 4L. She has a deep cough. Denies associated chest pain. No nausea or vomiting.  Recent admission 02/08-02/13 left ICA 80% stenosis, left M1 occlusion with M2 branch reconstitution with possible inferior left M2 stenosis/short segment occlusion with residual right sided weakness/deficits and dysarthria. Not a candidate for tPA considering LKN at earliest >24 hrs prior to arrival. She was started on DAPT with plan for TCAR with vascular surgery in a few weeks, discharged to [facility] 2/13. Also developed rash under her jaw and some swelling - there was potentially concern for allergic reaction to antibiotic at facility.  Pertinent  Medical History  HT, HLD, COPD (home O2 2L), prediabetes, CKD stage III, chronic tobacco abuse, ADD, GERD, Acute ischemic left MCA stroke, ICA stenosis, depression, allergy, arthritis, HOH (use of hearing aid), LGSIL (low grade squamous intraepithelial dysplasia), osteoporosis  Significant Hospital Events: Including procedures, antibiotic start and stop dates in addition to other pertinent events   10/07/21 admitted to ICU, intubation on arrival, arterial line. CT neck with possible prevertebral soft tissue stranding but no drainable collection, CT chest with multifocal aspiration/pneumonia 2/19 . MRI brain wo contrast reveal large acute/subacute left MCA territory infarct. The extent of infarct is increased relative to  February 8 MRI. In addition to the evolving left basal ganglia infarct, there now is acute or subacute large/confluent infarct involving the left frontal, parietal, and anterior temporal lobes, and insula. Associated edema with trace (1-2 mm) rightward midline shift. Probable mild petechial hemorrhage without mass occupying hemorrhagic transformation. The left MCA flow void is absent, compatible with M1 MCA occlusion seen on prior CTA. CTA head and neck reveals Persistent proximal left M1 occlusion, similar to previous. However, there has been interval occlusion of a downstream left M2 branch, with decreased perfusion within the infarcted left MCA territory as compared to previous.  Severe approximate 80% stenosis about the left carotid bulb/proximal cervical left ICA, similar to previous. Neurology consulted. Vent adjusted to try to match pt's effort on PS given trouble with dyssynchrony last night. Seems to want large tidal volumes, neural I-time of 0.9sec (lots of double triggering with lower I-times), long expiratory time. Placed on PC 14/5, FiO2 weaned to 40%, RR 12.  2/20  Hgb down to 6.5 received 1 unit PRBC on 2/19, today currently stable, still on pressors and heavy sedation (room to wean pressors off). Too sedated to extubate cxr a little worse. Stopped prop and fent changed to precedex gtt 02/21 On minimal sedation. Still on a touch of norepi. Much more awake and alert. Responds to voice commands. Able to move RUE/RLE. Vent adjusted to PS from PC overnight.   Interim History / Subjective:  MCV on minimal sedation. Much more awake and alert  Objective   Blood pressure (!) 113/57, pulse 96, temperature 99.7 F (37.6 C), resp. rate 16, height 5\' 1"  (1.549 m), weight 63.8 kg, SpO2 97 %.    Vent  Mode: PSV;CPAP FiO2 (%):  [40 %-50 %] 50 % Set Rate:  [12 bmp] 12 bmp PEEP:  [5 cmH20] 5 cmH20 Pressure Support:  [10 cmH20] 10 cmH20 Plateau Pressure:  [16 cmH20-19 cmH20] 19 cmH20   Intake/Output  Summary (Last 24 hours) at 10/09/2021 0925 Last data filed at 10/09/2021 2956 Gross per 24 hour  Intake 1299.71 ml  Output 1220 ml  Net 79.71 ml   Filed Weights   10/07/21 0319 10/08/21 0300 10/09/21 0349  Weight: 64 kg 63.1 kg 62.6 kg    Examination: General: 70yoF MCV on minimal sedation in no acute distress HENT: NCAT. No JVD Lungs: clear diminished throughout Cardiovascular: RRR. No ectopy HR 90-100's Abdomen: soft round Extremities: warm and dry Neuro: LUE and LLE flaccid. awake and alert. Responds to voice GU: Foley draining to BSB clear yellow urine  Resolved Hospital Problem list     Assessment & Plan:  Present on Admission:  Sepsis due to pneumonia [facility])  Acute respiratory failure with hypoxia ([place])  Acute ischemic left MCA stroke ([place])  Anemia  COPD exacerbation ([place])  Hyperlipidemia  Aspiration pneumonia (HCC)   Sepsis 2/2 likely aspiration pneumonia likely result of recent left MCA stroke c/b residual right sided weakness/deficits and dysarthria Leukocytosis Elevated lactic on arrival trended down on 2/19 (0.9) Zosyn Abx (2/18) Blood cultures NGTD Sputum culture NGTD  Acute hypoxic respiratory failure 2/2 aspiration pneumonia c/b COPD exacerbation given PMHx of COPD home 2L Pulmonary hygiene/VAP Bundle Wean ventilator support Pulmicort BID Brovana BID Duoneb PRN Thromboprophylaxis  Supplemental nutrition Continue cardiac monitoring with pulse ox  Acute ischemic left MCA stroke 2/7 c/b left MCA infarct extension associated edema with trace (1-2 mm) rightward midline shift left ICA 80% stenosis Hx of HLD Continue DAPT (aspirin/Plavix) Continue crestor daily SBP goal >120 for ICA occlusion Add Midodrine 5mg  TID Supportive therapy  AKI on CKD stage III Trend BMP/creatinine Avoid nephrotoxic agents Renal dose medications  Hx Anemia H/H 2/20 (8.1/25.6) S/p 1unit PRBC (2/19) Continue to trend CBC Monitor for s/s of  bleeding  Prediabetes Monitor blood glucose SSI  Tobacco abuse Nicotine patch Smoking cessation education once appropriate  Hx of depression Fluoxetine daily  Palliative Care Goals of care discussion Best Practice (right click and "Reselect all SmartList Selections" daily)   Diet/type: tubefeeds DVT prophylaxis: LMWH GI prophylaxis: PPI Lines: Arterial Line and yes and it is still needed Foley:  Yes, and it is still needed Code Status:  full code Last date of multidisciplinary goals of care discussion []   Labs   CBC: Recent Labs  Lab 10/07/21 0340 10/07/21 0442 10/08/21 0305 10/08/21 0310 10/08/21 2049 10/08/21 2125 10/09/21 0312 10/09/21 1607 10/10/21 0328  WBC 20.1*  --  20.6*  --   --   --  20.6* 21.0* 16.0*  NEUTROABS 17.5*  --   --   --   --   --   --   --   --   HGB 10.0*   < > 7.4*   < > 6.5* 6.5* 8.1* 7.9* 7.6*  HCT 32.5*   < > 23.7*   < > 19.0* 20.9* 25.6* 25.0* 24.1*  MCV 89.8  --  89.8  --   --   --  89.8 89.0 90.3  PLT 450*  --  376  --   --   --  366 391 368   < > = values in this interval not displayed.    Basic Metabolic Panel: Recent Labs  Lab 10/07/21  0340 10/07/21 0442 10/07/21 1201 10/07/21 1734 10/07/21 2000 10/07/21 2319 10/08/21 0305 10/08/21 0310 10/08/21 2049 10/09/21 0312  NA 137 138   138   < > 141   < > 143 139 142 142 140  K 4.8 4.7   4.7   < > 4.1   < > 3.6 3.6 3.7 4.0 3.8  CL 105 107  --  108  --   --  104  --   --  105  CO2 19*  --   --  23  --   --  25  --   --  25  GLUCOSE 147* 139*  --  276*  --   --  130*  --   --  135*  BUN 30* 31*  --  26*  --   --  26*  --   --  19  CREATININE 1.62* 1.60*  --  1.42*  --   --  1.21*  --   --  1.26*  CALCIUM 9.1  --   --  8.0*  --   --  8.1*  --   --  8.4*   < > = values in this interval not displayed.   GFR: Estimated Creatinine Clearance: 34.7 mL/min (A) (by C-G formula based on SCr of 1.26 mg/dL (H)). Recent Labs  Lab 10/07/21 0340 10/07/21 0608 10/07/21 1739  10/08/21 0305 10/08/21 2125 10/09/21 0312  WBC 20.1*  --   --  20.6*  --  20.6*  LATICACIDVEN 2.8* 2.7* 3.4*  --  0.9  --     Liver Function Tests: Recent Labs  Lab 10/07/21 0340 10/07/21 1734  AST 44* 55*  ALT 47* 53*  ALKPHOS 241* 206*  BILITOT 0.7 0.5  PROT 7.1 6.3*  ALBUMIN 2.5* 2.0*   No results for input(s): LIPASE, AMYLASE in the last 168 hours. No results for input(s): AMMONIA in the last 168 hours.  ABG    Component Value Date/Time   PHART 7.373 10/08/2021 2049   PCO2ART 45.6 10/08/2021 2049   PO2ART 60 (L) 10/08/2021 2049   HCO3 26.2 10/08/2021 2049   TCO2 27 10/08/2021 2049   ACIDBASEDEF 2.0 10/07/2021 2000   O2SAT 88 10/08/2021 2049     Coagulation Profile: Recent Labs  Lab 10/07/21 0340  INR 1.1    Cardiac Enzymes: No results for input(s): CKTOTAL, CKMB, CKMBINDEX, TROPONINI in the last 168 hours.  HbA1C: Hgb A1c MFr Bld  Date/Time Value Ref Range Status  09/27/2021 08:30 PM 5.5 4.8 - 5.6 % Final    Comment:    (NOTE) Pre diabetes:          5.7%-6.4%  Diabetes:              >6.4%  Glycemic control for   <7.0% adults with diabetes   04/18/2021 01:00 PM 6.0 4.6 - 6.5 % Final    Comment:    Glycemic Control Guidelines for People with Diabetes:Non Diabetic:  <6%Goal of Therapy: <7%Additional Action Suggested:  >8%     CBG: Recent Labs  Lab 10/08/21 1705 10/08/21 1946 10/08/21 2300 10/09/21 0312 10/09/21 0738  GLUCAP 107* 114* 141* 136* 90    Review of Systems:     Past Medical History:  She,  has a past medical history of ADD (attention deficit disorder), Allergy, Arthritis, CKD (chronic kidney disease), stage III ([place]), COPD (chronic obstructive pulmonary disease) ([place]) (12/09/2014), Depression, Does use hearing aid, Dyspnea, Hyperlipidemia, Hypertension, LGSIL (low grade squamous intraepithelial dysplasia),  Osteoporosis, and Pre-diabetes.   Surgical History:   Past Surgical History:  Procedure Laterality Date   JOINT  REPLACEMENT     right knee   MULTIPLE TOOTH EXTRACTIONS     REPLACEMENT TOTAL KNEE  2013   right   REVERSE SHOULDER ARTHROPLASTY Right 03/30/2019   Procedure: REVERSE TOTAL SHOULDER ARTHROPLASTY;  Surgeon: Netta Cedars, MD;  Location: [facility];  Service: Orthopedics;  Laterality: Right;  interscalene block   TOTAL KNEE ARTHROPLASTY Left 12/27/2017   TOTAL KNEE ARTHROPLASTY Left 12/27/2017   Procedure: LEFT TOTAL KNEE ARTHROPLASTY;  Surgeon: Netta Cedars, MD;  Location: [facility];  Service: Orthopedics;  Laterality: Left;     Social History:   reports that she has been smoking cigarettes. She has a 12.25 pack-year smoking history. She has never used smokeless tobacco. She reports that she does not drink alcohol and does not use drugs.   Family History:  Her family history includes Alcohol abuse in her father and maternal uncle; Arthritis in her father; Colon polyps in her father; Hyperlipidemia in her mother; Stroke in her maternal grandmother.   Allergies Allergies  Allergen Reactions   Macrobid [Nitrofurantoin Monohyd Macro] Nausea And Vomiting    GI upset     Home Medications  Prior to Admission medications   Medication Sig Start Date End Date Taking? Authorizing Provider  albuterol (VENTOLIN HFA) 108 (90 Base) MCG/ACT inhaler Inhale 1-2 puffs into the lungs every 4 (four) hours as needed for wheezing or shortness of breath. 05/10/20  Yes Burns, Claudina Lick, MD  amLODipine (NORVASC) 5 MG tablet TAKE 1 TABLET BY MOUTH EVERY DAY Patient taking differently: Take 5 mg by mouth daily. 05/04/21  Yes Burns, Claudina Lick, MD  aspirin EC 325 MG EC tablet Take 1 tablet (325 mg total) by mouth daily. 10/02/21  Yes Hosie Poisson, MD  atomoxetine (STRATTERA) 100 MG capsule TAKE 1 CAPSULE BY MOUTH EVERY DAY Patient taking differently: Take 100 mg by mouth daily. 08/07/21  Yes Burns, Claudina Lick, MD  Budeson-Glycopyrrol-Formoterol (BREZTRI AEROSPHERE) 160-9-4.8 MCG/ACT AERO Inhale 2 puffs into the lungs 2 (two) times  daily. 05/30/21  Yes Burns, Claudina Lick, MD  clopidogrel (PLAVIX) 75 MG tablet Take 1 tablet (75 mg total) by mouth daily. 10/02/21  Yes Hosie Poisson, MD  cyclobenzaprine (FLEXERIL) 10 MG tablet Take 0.5-1 tablets (5-10 mg total) by mouth 3 (three) times daily as needed for muscle spasms. Patient taking differently: Take 5 mg by mouth 3 (three) times daily as needed for muscle spasms. 04/17/21  Yes Burns, Claudina Lick, MD  FLUoxetine (PROZAC) 20 MG capsule TAKE 1 CAPSULE (20 MG TOTAL) BY MOUTH DAILY IN ADDITION TO 40 MG FOR A TOTAL OF 60 MG. Patient taking differently: Take 60 mg by mouth daily. 04/17/21  Yes Burns, Claudina Lick, MD  fluticasone (FLONASE) 50 MCG/ACT nasal spray Place 1 spray into both nostrils daily as needed for allergies.   Yes [provider]  levofloxacin (LEVAQUIN) 750 MG tablet Take 750 mg by mouth See admin instructions. Every 48 hours x 4 doses for pneumonia   Yes [provider]  losartan (COZAAR) 100 MG tablet TAKE 1 TABLET BY MOUTH EVERY DAY Patient taking differently: Take 100 mg by mouth daily. 09/11/21  Yes Burns, Claudina Lick, MD  meclizine (ANTIVERT) 25 MG tablet Take 1 tablet (25 mg total) by mouth 3 (three) times daily as needed for dizziness. 09/28/19  Yes Burns, Claudina Lick, MD  nicotine (NICODERM CQ - DOSED IN MG/24 HOURS) 14  mg/24hr patch Place 1 patch (14 mg total) onto the skin daily. 10/02/21  Yes Hosie Poisson, MD  omeprazole (PRILOSEC) 20 MG capsule TAKE 1 CAPSULE BY MOUTH EVERY DAY Patient taking differently: Take 20 mg by mouth daily. 06/21/21  Yes Burns, Claudina Lick, MD  OXYGEN Inhale 2 L into the lungs as needed (to keep 02 sats above 90%).   Yes [provider]  rOPINIRole (REQUIP) 0.25 MG tablet TAKE 1 TABLET BY MOUTH AT BEDTIME. Patient taking differently: Take 0.25 mg by mouth at bedtime. 06/12/21  Yes Burns, Claudina Lick, MD  rosuvastatin (CRESTOR) 20 MG tablet Take 1 tablet (20 mg total) by mouth daily. 04/17/21  Yes Burns, Claudina Lick, MD  traZODone (DESYREL)  50 MG tablet TAKE 1 TABLET BY MOUTH EVERYDAY AT BEDTIME Patient taking differently: Take 50 mg by mouth at bedtime. 09/14/21  Yes Burns, Claudina Lick, MD  FLUoxetine (PROZAC) 40 MG capsule TAKE 1 CAPSULE BY MOUTH DAILY. IN ADDITION TO 20 MG FOR TOTAL OF 60 MG DAILY Patient not taking: Reported on 10/07/2021 09/14/21   Binnie Rail, MD  food thickener (SIMPLYTHICK, NECTAR/LEVEL 2/MILDLY THICK,) GEL Take 10 packets by mouth as needed. 10/02/21   Hosie Poisson, MD  promethazine (PHENERGAN) 25 MG/ML injection Inject 12.5 mg into the vein once.    [provider]     Critical care time:

## 2021-10-09 NOTE — Progress Notes (Addendum)
NAME:  RAZAN SILER, MRN:  [id number], DOB:  Jan 11, 1951, LOS: 2 ADMISSION DATE:  10/07/2021, CONSULTATION DATE:  10/07/21 REFERRING MD:  Lorin Mercy, CHIEF COMPLAINT:  increased work of breathing   History of Present Illness:  71yF with history of COPD on 2L [place], CKD stage III, chronic tobacco abuse, ADD, recent admission for left ICA 80% stenosis, left M1 occlusion with M2 branch reconstitution with possible inferior left M2 stenosis/short segment occlusion with residual right sided weakness. She was started on DAPT with plan for TCAR with vascular surgery in a few weeks, discharged to [facility] 2/13. She developed gradually worsening dyspnea over last several days and was started on levaquin in her facility with concern for pneumonia- pt and family believe it is due to aspiration.   Also developed rash under her jaw and some swelling - there was potentially concern for allergic reaction to antibiotic at facility.   In ED she was given vanc, zosyn and sepsis bolus. She had increased work of breathing on NRB, given duonebs, solumedrol, and trialed on [facility] but ultimately intubated on arrival to ICU.   Pertinent  Medical History  COPD  Chronic hypoxemic respiratory failure on 2L [place] L MCA CVA ICA stenosis Smoking  Significant Hospital Events: Including procedures, antibiotic start and stop dates in addition to other pertinent events   10/07/21 admitted to ICU, intubation, arterial line. CT neck with possible prevertebral soft tissue stranding but no drainable collection, CT chest with multifocal aspiration/pneumonia 2/19 neuro consulted for possible pupil change and concern for stroke. 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 CTA . Persistent proximal left M1 occlusion, similar to previous. However, there has been interval occlusion of a downstream left  M2 branch, with decreased perfusion within the infarcted left MCA territory as compared to previous.2. Severe approximate 80% stenosis about the left carotid bulb/proximal cervical left ICA, similar to previous. 3. Additional mild atheromatous change elsewhere about the major arterial vasculature of the head and neck. No other hemodynamically. UEs assessed for PICC. Vessels too small diameter. Placed on PCV for vent synchrony  2/20 hgb down to 6.5 got 1 unit PRBC , still on pressors but also on fairly heavy sedation w. Room to wean pressors. Too sedated to extubate cxr a little worse. Stopped prop and fent changed to precedex    Interim History / Subjective:  Heavily sedated on prop and fent    Objective   Blood pressure (Abnormal) 105/54, pulse (Abnormal) 114, temperature 99.3 F (37.4 C), resp. rate 12, height 5\' 1"  (1.549 m), weight 62.6 kg, SpO2 (Abnormal) 89 %.    Vent Mode: PSV;CPAP FiO2 (%):  [40 %-50 %] 40 % Set Rate:  [12 bmp] 12 bmp PEEP:  [5 cmH20] 5 cmH20 Pressure Support:  [5 cmH20-10 cmH20] 10 cmH20 Plateau Pressure:  [17 cmH20] 17 cmH20   Intake/Output Summary (Last 24 hours) at 10/09/2021 0848 Last data filed at 10/09/2021 0800 Gross per 24 hour  Intake 1488.55 ml  Output 1320 ml  Net 168.55 ml   Filed Weights   10/07/21 0319 10/08/21 0300 10/09/21 0349  Weight: 64 kg 63.1 kg 62.6 kg    Examination: General chronically ill appearing 71 year old female. Sedated on prop and fent infusions HENT NCAT no palpable neck edema. Looks symmetrical, orally intubated Pulm coarse rales and rhonchi. Prolonged exp phase VTs  700s on PSV of 10 peep 5 no accessory use Pcxr personally reviewed ett good position. R>L airspace disease. Overall worse aeration  Card rrr Abd soft   Ext no sig edema Neuro sedated./ pupils equal today but too sedated to f/c  Resolved Hospital Problem list     Assessment & Plan:   Principal Problem:   Aspiration pneumonia ([place]) Active Problems:    Acute respiratory failure with hypoxia ([place])   Sepsis due to pneumonia ([place])   Septic shock ([place])   COPD exacerbation ([place])   AKI (acute kidney injury) ([place])   Acute ischemic left MCA stroke (HCC)   Hypotension due to drugs   Dysphagia as late effect of cerebrovascular accident (CVA)   Hyperlipidemia   Anemia   Metabolic encephalopathy   ICAO (internal carotid artery occlusion)    Acute on chronic hypoxic hypercapnic respiratory failure due Aspiration pneumonia and possible  AECOPD, baseline PFTs however do not show obstruction -cultures still pending. CXR worse.  Plan Cont daily assessment for SBT Support on PCV -> careful titration of PC as her spont volumes far exceed low tidal volume ventilation limits in the 700 ml range PAD protocol RASS goal -1  VAP bundle  Day 4 zosyn Will trend PCT and cbc Consider IV diuresis once off pressors Cont scheduled bds Complete 5d course pred VT  Septic shock  due to aspiration pneumonia c/b drug induced hypotension from sedation Also concern for possible deep space neck infection: has h/o poor dentition but aspiration likely result of recent extension of stroke  -cbc remains elevated.  -CTa completed and did not raise concern for previously mentioned deep space infection of neck -cultures neg  Plan Abx per above  Weaning norepi (BP goal 130-160 given ICA occlusion)  Keep euvolemic F/u echo  Acute metabolic encephalopathy  Plan Supportive care Dc prop and fent infusions Precedex and PRN fent   Subacute stroke due to extension of left MCA stroke and Anisocoria from prior known ICA occlusion Plan BP goal 130-160 given high grade left ICA occlusion Cont DAPT and statin  Supportive care  AKI: Scr still slightly elevated Plan Trend chems Renal dose meds Avoid hypotension   Mild anemia w/out evidence of bleeding Plan Trend cbc Trigger for transfusion < 7; got 1 unit today   MDD/mood disorder: Plan prozac  Best Practice  (right click and "Reselect all SmartList Selections" daily)   Diet/type: tubefeeds - DVT prophylaxis: LMWH GI prophylaxis: H2B Lines: Arterial Line Foley:  Yes, and it is still needed Code Status:  full code Last date of multidisciplinary goals of care discussion [will update today]  Critical care time: 34 min    Erick Colace ACNP-BC [place] Pager # [phone] OR # [phone] if no answer

## 2021-10-09 NOTE — Progress Notes (Signed)
OT Cancellation Note  Patient Details Name: Sheryl [name] MRN: [id number] DOB: [birth year]   Cancelled Treatment:    Reason Eval/Treat Not Completed: Medical issues which prohibited therapy (Sedated. Intubated. Planning to reduce sedation later today. Will return as schedule allows.)  [place], OTR/L Acute Rehab Office: [phone] 10/09/2021, 8:38 AM

## 2021-10-09 NOTE — Progress Notes (Signed)
PT Cancellation Note  Patient Details Name: Sheryl [name] MRN: [id number] DOB: [birth year]   Cancelled Treatment:    Reason Eval/Treat Not Completed: Medical issues which prohibited therapy Sedated. Intubated. Planning to reduce sedation later today. Will return as schedule allows.     [name] 10/09/2021, 9:09 AM Marisa Severin, PT, DPT Acute Rehabilitation Services Pager [phone] Office [phone]

## 2021-10-10 ENCOUNTER — Inpatient Hospital Stay (HOSPITAL_COMMUNITY): Payer: Medicare PPO

## 2021-10-10 DIAGNOSIS — I952 Hypotension due to drugs: Secondary | ICD-10-CM

## 2021-10-10 DIAGNOSIS — J690 Pneumonitis due to inhalation of food and vomit: Secondary | ICD-10-CM | POA: Diagnosis not present

## 2021-10-10 DIAGNOSIS — J9621 Acute and chronic respiratory failure with hypoxia: Secondary | ICD-10-CM | POA: Diagnosis not present

## 2021-10-10 DIAGNOSIS — Z66 Do not resuscitate: Secondary | ICD-10-CM

## 2021-10-10 DIAGNOSIS — A419 Sepsis, unspecified organism: Secondary | ICD-10-CM | POA: Diagnosis not present

## 2021-10-10 DIAGNOSIS — R6521 Severe sepsis with septic shock: Secondary | ICD-10-CM | POA: Diagnosis not present

## 2021-10-10 LAB — BASIC METABOLIC PANEL
Anion gap: 9 (ref 5–15)
BUN: 21 mg/dL (ref 8–23)
CO2: 27 mmol/L (ref 22–32)
Calcium: 8.4 mg/dL — ABNORMAL LOW (ref 8.9–10.3)
Chloride: 106 mmol/L (ref 98–111)
Creatinine, Ser: 1.16 mg/dL — ABNORMAL HIGH (ref 0.44–1.00)
GFR, Estimated: 50 mL/min — ABNORMAL LOW (ref >60)
Glucose, Bld: 180 mg/dL — ABNORMAL HIGH (ref 70–99)
Potassium: 3.5 mmol/L (ref 3.5–5.1)
Sodium: 142 mmol/L (ref 135–145)

## 2021-10-10 LAB — MAGNESIUM: Magnesium: 2.3 mg/dL (ref 1.7–2.4)

## 2021-10-10 LAB — CBC
HCT: 24.1 % — ABNORMAL LOW (ref 36.0–46.0)
Hemoglobin: 7.6 g/dL — ABNORMAL LOW (ref 12.0–15.0)
MCH: 28.5 pg (ref 26.0–34.0)
MCHC: 31.5 g/dL (ref 30.0–36.0)
MCV: 90.3 fL (ref 80.0–100.0)
Platelets: 368 10*3/uL (ref 150–400)
RBC: 2.67 MIL/uL — ABNORMAL LOW (ref 3.87–5.11)
RDW: 15.1 % (ref 11.5–15.5)
WBC: 16 10*3/uL — ABNORMAL HIGH (ref 4.0–10.5)
nRBC: 0 % (ref 0.0–0.2)

## 2021-10-10 LAB — PROCALCITONIN: Procalcitonin: 3.21 ng/mL

## 2021-10-10 LAB — CULTURE, RESPIRATORY W GRAM STAIN: Culture: NORMAL

## 2021-10-10 LAB — GLUCOSE, CAPILLARY
Glucose-Capillary: 159 mg/dL — ABNORMAL HIGH (ref 70–99)
Glucose-Capillary: 182 mg/dL — ABNORMAL HIGH (ref 70–99)
Glucose-Capillary: 130 mg/dL — ABNORMAL HIGH (ref 70–99)
Glucose-Capillary: 53 mg/dL — ABNORMAL LOW (ref 70–99)
Glucose-Capillary: 191 mg/dL — ABNORMAL HIGH (ref 70–99)
Glucose-Capillary: 127 mg/dL — ABNORMAL HIGH (ref 70–99)
Glucose-Capillary: 99 mg/dL (ref 70–99)

## 2021-10-10 LAB — PHOSPHORUS
Phosphorus: 4.2 mg/dL (ref 2.5–4.6)
Phosphorus: 3.8 mg/dL (ref 2.5–4.6)

## 2021-10-10 MED ORDER — FENTANYL CITRATE PF 50 MCG/ML IJ SOSY: 12.5000 ug | PREFILLED_SYRINGE | INTRAMUSCULAR | Status: DC | PRN

## 2021-10-10 MED ORDER — DEXTROSE-NACL 5-0.45 % IV SOLN: INTRAVENOUS | Status: DC

## 2021-10-10 MED ORDER — INSULIN ASPART 100 UNIT/ML IJ SOLN: 0.0000 [IU] | INTRAMUSCULAR | Status: DC

## 2021-10-10 MED ORDER — DEXTROSE 50 % IV SOLN
INTRAVENOUS | Status: AC
  Administered 2021-10-10: 25 g via INTRAVENOUS
  Filled 2021-10-10: qty 50

## 2021-10-10 MED ORDER — MIDODRINE HCL 5 MG PO TABS: 5.0000 mg | ORAL_TABLET | Freq: Three times a day (TID) | ORAL | Status: DC

## 2021-10-10 MED ORDER — OSMOLITE 1.2 CAL PO LIQD
1000.0000 mL | ORAL | Status: DC
  Filled 2021-10-10: qty 1000

## 2021-10-10 MED ORDER — DEXTROSE 50 % IV SOLN: 25.0000 g | Freq: Once | INTRAVENOUS | Status: AC

## 2021-10-10 NOTE — Progress Notes (Signed)
Interdisciplinary Goals of Care Family Meeting    Date carried out:: 10/10/2021  Location of the meeting: Bedside  Member's involved: Nurse Practitioner, Bedside Registered Nurse, and Family Member or next of kin  Durable Power of Attorney or acting medical decision maker:  Wynne Rozak (daughter)   Discussion: We discussed goals of care for [employer] .  She has long valued independence and quality of life. We reviewed her living will. Discussed life support, trach, and even eventual need to decide on tube feeding/alternative means of nutrition. At the end of our discussion the plan that seemed to make the most sense and that was consistent with with what [name] and [name] (Macaria's sister)  felt would be Kina's instructions would be are as follows.  1) extubate when medically optimized 2) no re-intubation  3) no trach  4) Full DNR  5) continue supportive medical care. At this point the decision about alternative long term tubefeeds are up in the air. Family feels that she would not want that but are hopeful that post-extubation with some time she may be able to participate in that decision. For the short term we will place [place] to see if we can maximize nutrition and supportive therapy as we re-investigate her swallowing function.   They understand that should she decline again or slowly decline we could be looking at a situation where hospice might be the best option for treatment   Code status: Full DNR  Disposition: Continue current acute care  Time spent for the meeting: 34 minutes   Clementeen Graham 10/10/2021 1:00 PM

## 2021-10-10 NOTE — Progress Notes (Addendum)
NAME:  Sheryl [name], MRN:  [id number], DOB:  [birth year], LOS: 3 ADMISSION DATE:  10/07/2021, CONSULTATION DATE:  10/07/21 REFERRING MD:  Lorin Mercy, CHIEF COMPLAINT:  increased work of breathing   History of Present Illness:  67yF with history of COPD on 2L [place], CKD stage III, chronic tobacco abuse, ADD, recent admission for left ICA 80% stenosis, left M1 occlusion with M2 branch reconstitution with possible inferior left M2 stenosis/short segment occlusion with residual right sided weakness. She was started on DAPT with plan for TCAR with vascular surgery in a few weeks, discharged to [facility] 2/13. She developed gradually worsening dyspnea over last several days and was started on levaquin in her facility with concern for pneumonia- pt and family believe it is due to aspiration.   Also developed rash under her jaw and some swelling - there was potentially concern for allergic reaction to antibiotic at facility.   In ED she was given vanc, zosyn and sepsis bolus. She had increased work of breathing on NRB, given duonebs, solumedrol, and trialed on [facility] but ultimately intubated on arrival to ICU.   Pertinent  Medical History  COPD  Chronic hypoxemic respiratory failure on 2L [place] L MCA CVA ICA stenosis Smoking  Significant Hospital Events: Including procedures, antibiotic start and stop dates in addition to other pertinent events   10/07/21 admitted to ICU, intubation, arterial line. CT neck with possible prevertebral soft tissue stranding but no drainable collection, CT chest with multifocal aspiration/pneumonia 2/19 neuro consulted for possible pupil change and concern for stroke. 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 CTA . Persistent proximal left M1 occlusion, similar to previous. However, there has been interval occlusion of a downstream left  M2 branch, with decreased perfusion within the infarcted left MCA territory as compared to previous.2. Severe approximate 80% stenosis about the left carotid bulb/proximal cervical left ICA, similar to previous. 3. Additional mild atheromatous change elsewhere about the major arterial vasculature of the head and neck. No other hemodynamically. UEs assessed for PICC. Vessels too small diameter. Placed on PCV for vent synchrony  2/20 hgb down to 6.5 got 1 unit PRBC , still on pressors but also on fairly heavy sedation w. Room to wean pressors. Too sedated to extubate cxr a little worse. Stopped prop and fent changed to precedex. Discussed plan of care w/ daughter she does not want trach. Understands could be looking at hospice of fails extubation  2/21 more awake off sedating gtts but still not awake enough to extubate. CXR better. Adjusting precedex ceiling down. Social work consulted for placement (family not wanting her to return to SNF she was at) adding Midodrine. Discussed w/ neuro and changing goal SBP to > 120   Interim History / Subjective:  More awake    Objective   Blood pressure 128/61, pulse 100, temperature 100.2 F (37.9 C), resp. rate 17, height 5\' 1"  (1.549 m), weight 63.8 kg, SpO2 98 %.    Vent Mode: PSV;CPAP FiO2 (%):  [40 %-50 %] 50 % Set Rate:  [12 bmp] 12 bmp PEEP:  [5 cmH20] 5 cmH20 Pressure Support:  [10 cmH20] 10 cmH20 Plateau Pressure:  [16 cmH20-19 cmH20] 19 cmH20   Intake/Output Summary (Last 24 hours) at 10/10/2021 1310 Last data filed at 10/10/2021 1210 Gross per 24 hour  Intake 1155.33 ml  Output 1195 ml  Net -39.67 ml   Filed Weights   10/08/21 0300 10/09/21 0349 10/10/21 0400  Weight: 63.1 kg 62.6 kg 63.8 kg    Examination: General chronically ill 71 year old female resting on vent. No accessory use and good VT on PSV 10. More awake than my am exam 2/20 but not following commands and apparently had been doing that later in day yesterday  HENT PERRL no  JVD orally intubated Pulm scattered rhonchi. VTs 700s on PSV 10 no accessory use PCXR w/ improved aeration  Card rrr Abd soft  Ext + dependent edema  Neuro more awake. Right sided weakness. Localizes but not following commands. Pupils equal   Resolved Hospital Problem list     Assessment & Plan:   Principal Problem:   Aspiration pneumonia ([place]) Active Problems:   Acute respiratory failure with hypoxia ([place])   Sepsis due to pneumonia ([place])   Septic shock ([place])   COPD exacerbation ([place])   AKI (acute kidney injury) ([place])   Acute ischemic left MCA stroke (HCC)   Hypotension due to drugs   Dysphagia as late effect of cerebrovascular accident (CVA)   Hyperlipidemia   Anemia   Metabolic encephalopathy   ICAO (internal carotid artery occlusion)   Acute and chronic respiratory failure with hypoxia ([place])   DNR (do not resuscitate)    Acute on chronic hypoxic hypercapnic respiratory failure due Aspiration pneumonia and possible  AECOPD, baseline PFTs however do not show obstruction -cultures still pending. CXR worse.  Plan Cont daily assessment for SBT  Looks comfortable on PSV but not ready for extubation Mental status wise. Hopefully later today or tomorrow  Discussed w/ daughter 2/20 she will be one-way extubation and if fails would transition to comfort.  PAD protocol RASS goal 0 to -1 VAP bundle  Day 5 of 7 zosyn  Am cxr  Cxr better will hold off on diuresis   Septic shock  due to aspiration pneumonia c/b drug induced hypotension from sedation Also concern for possible deep space neck infection: has h/o poor dentition but aspiration likely result of recent extension of stroke  -cbc remains elevated.  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  Plan Replace  Mild anemia w/out evidence of bleeding Plan Trend cbc Trigger for transfusion < 7  DM w/ hyperglycemia Plan Ssi  Cont current w/ goal 140-180  If trend cont to climb will change to resistant and add basal dose   MDD/mood disorder: Plan Cont prozac  Best Practice (right click and "Reselect all SmartList Selections" daily)   Diet/type: tubefeeds - DVT prophylaxis: LMWH GI prophylaxis: H2B Lines: Arterial Line Foley:  Yes, and it is still needed Code Status:  full code->need to readdress specifically code status as on goals of care 2/20 family indicated no re-intubation and no trach  Last date of multidisciplinary goals of care discussion 2/20 see above   Critical care time: 40 minutes including discussion of  plan of care w/ nursing staff and stroke MD    Erick Colace ACNP-BC [place] Pager # [phone] OR # [phone] if no answer

## 2021-10-10 NOTE — Progress Notes (Signed)
Initial Nutrition Assessment  DOCUMENTATION CODES:   Not applicable  INTERVENTION:   Plan for Cortrak tomorrow  If able to advance diet, recommend Ensure Enlive po BID, each supplement provides 350 kcal and 20 grams of protein.  Tube Feeding via Cortrak:  Osmolite 1.2 at 60 ml/hr This provides 80 g of protein, 1728 kcals and 1166 mL of free water   NUTRITION DIAGNOSIS:   Inadequate oral intake related to acute illness, dysphagia as evidenced by NPO status.  GOAL:   Patient will meet greater than or equal to 90% of their needs   MONITOR:   Diet advancement, TF tolerance, Labs, Weight trends  REASON FOR ASSESSMENT:   Ventilator    ASSESSMENT:   71 yo female admitted with acute on chronic respiratory failure requiring intubation, sepsis, severe acidosis, AKI. PMH includes COPD on 2L at home, CKD 3, L MCA CVA, ICA stenosis, +tobacco use (smoker)  2/19 Neuro consult-left MCA infarct extension of previous stroke likely secondary to left MCA occlusion and left ICA high grade stenosis 2/20 Adult TF protocol initiated 2/21 Extubated  Extubated today. Pt is DNR NPO, Noted plan for possible Cortrak tomorrow. SLP eval pending  No family at bedside on visit today;unable to obtain any diet and weight history from pt at this time.   Labs: reviewed Meds: ss novolog   NUTRITION - FOCUSED PHYSICAL EXAM:  Flowsheet Row Most Recent Value  Orbital Region Mild depletion  Upper Arm Region No depletion  Thoracic and Lumbar Region No depletion  Buccal Region Mild depletion  Temple Region Mild depletion  Clavicle Bone Region No depletion  Clavicle and Acromion Bone Region No depletion  Scapular Bone Region No depletion  Dorsal Hand No depletion  Patellar Region Unable to assess  Anterior Thigh Region Unable to assess  Posterior Calf Region Unable to assess  Edema (RD Assessment) Mild       Diet Order:   Diet Order             Diet NPO time specified  Diet effective  now                   EDUCATION NEEDS:   Not appropriate for education at this time  Skin:  Skin Assessment: Skin Integrity Issues: Skin Integrity Issues:: Other (Comment), DTI DTI: R/L heel Other: MASD bialteral buttocks  Last BM:  2/21  Height:   Ht Readings from Last 1 Encounters:  10/07/21 5\' 1"  (1.549 m)    Weight:   Wt Readings from Last 1 Encounters:  10/10/21 63.8 kg     BMI:  Body mass index is 26.58 kg/m.  Estimated Nutritional Needs:   Kcal:  1700-1900 kcals  Protein:  80-90 g  Fluid:  >/= 1.7 L   Kerman Passey MS, RDN, LDN, CNSC Registered Dietitian III Clinical Nutrition RD Pager and On-Call Pager Number Located in [place]

## 2021-10-10 NOTE — Progress Notes (Addendum)
STROKE TEAM PROGRESS NOTE   INTERVAL HISTORY RN is at bedside.  Sheryl [name] was intubated today and so far tolerating well.  Now on p.o. pending speech and likely need core track tomorrow.  Sheryl [name] lying in bed, still has right hemianopia versus neglect, left gaze preference, right hemiplegia and global aphasia.  BP stable with Levophed as needed, recommend midodrine  Vitals:   10/10/21 1300 10/10/21 1315 10/10/21 1330 10/10/21 1345  BP: 128/61     Pulse: 100 (!) 103 95 (!) 102  Resp: 17 16 19 17   Temp: 100.2 F (37.9 C) 100.2 F (37.9 C)    TempSrc:      SpO2: 98% 98% 91% 96%  Weight:      Height:       CBC:  Recent Labs  Lab 10/07/21 0340 10/07/21 0442 10/09/21 1607 10/10/21 0328  WBC 20.1*   < > 21.0* 16.0*  NEUTROABS 17.5*  --   --   --   HGB 10.0*   < > 7.9* 7.6*  HCT 32.5*   < > 25.0* 24.1*  MCV 89.8   < > 89.0 90.3  PLT 450*   < > 391 368   < > = values in this interval not displayed.   Basic Metabolic Panel:  Recent Labs  Lab 10/09/21 0312 10/09/21 1032 10/09/21 1607 10/10/21 0328  NA 140  --   --  142  K 3.8  --   --  3.5  CL 105  --   --  106  CO2 25  --   --  27  GLUCOSE 135*  --   --  180*  BUN 19  --   --  21  CREATININE 1.26*  --   --  1.16*  CALCIUM 8.4*  --   --  8.4*  MG  --    < > 2.1 2.3  PHOS  --    < > 4.9* 4.2   < > = values in this interval not displayed.   Lipid Panel:  Recent Labs  Lab 10/08/21 0305  TRIG 71   HgbA1c: No results for input(s): HGBA1C in the last 168 hours. Urine Drug Screen: No results for input(s): LABOPIA, COCAINSCRNUR, LABBENZ, AMPHETMU, THCU, LABBARB in the last 168 hours.  Alcohol Level No results for input(s): ETH in the last 168 hours.  IMAGING past 24 hours DG Chest Port 1 View  Result Date: 10/10/2021 CLINICAL DATA:  Intubation.  Aspiration pneumonia EXAM: PORTABLE CHEST 1 VIEW COMPARISON:  Yesterday FINDINGS: Endotracheal tube with tip between the clavicular heads and carina. The enteric tube tip and  side-port are at the stomach. Diffuse interstitial coarsening and patchy airspace opacity. Aeration is improved from yesterday. No visible effusion or pneumothorax. IMPRESSION: Unremarkable hardware. Improving aeration. Electronically Signed   By: Jorje Guild M.D.   On: 10/10/2021 06:14    PHYSICAL EXAM  Temp:  [99.3 F (37.4 C)-100.8 F (38.2 C)] 100.2 F (37.9 C) (02/21 1315) Pulse Rate:  [95-118] 102 (02/21 1345) Resp:  [11-24] 17 (02/21 1345) BP: (106-167)/(50-83) 128/61 (02/21 1300) SpO2:  [90 %-100 %] 96 % (02/21 1345) Arterial Line BP: (105-177)/(38-76) 124/45 (02/21 1345) FiO2 (%):  [40 %-50 %] 50 % (02/21 0745) Weight:  [63.8 kg] 63.8 kg (02/21 0400)  General - Well nourished, well developed, no acute distress  Ophthalmologic - fundi not visualized due to noncooperation.  Cardiovascular - Regular rate and rhythm.  Neuro - Sheryl [name] extubated, awake, alert, eyes open spontaneously, however  global aphasia, no language output, not following simple commands. Eyes in left gaze preference position, not blinking to visual threat on the right, able to blink to threat on the left, not tracking on the right, pupil equal size.  Mild right nasolabial fold flattening.  Tongue protrusion not cooperative. LUE able to to against gravity without drift, LLE active withdraw to pain stimulation, however hemiplegia on the right. DTR diminished and no babinski. Sensation, coordination not corporative and gait not tested.   ASSESSMENT/PLAN Sheryl [name] is a 71 y.o. female with history of HTN, HLD, COPD, smoking, pre-DM, CKD and recent stroke who was initially admitted on 2/7 with right sided weakness and dysarthria.  She was found to have a left MCA stroke with 80-99% left ICA stenosis.  She was discharged to a SNF and re-admitted with pneumonia and sepsis.  While in the ICU, she was found to have anisocoria.  Repeat MRI revealed extension of her left MCA infarct.   Stroke extension:  left MCA  infarct extension likely secondary left MCA occlusion and left ICA high grade stenosis with failed collaterals CT head edematous acute to subacute infarction in left basal ganglia and corona radiata CTA head & neck proximal left M1 occlusion with interval occlusion of downstream M2 branch, 80% stenosis of left carotid bulb/ proximal cervical ICA MRI  extension of left MCA territory infarct with acute or subacute infarct involving left frontal, parietal, interior temporal lobes and insula, trace rightward midline shift and mild petechial hemorrhage Carotid Doppler  1-39% stenosis of right ICA, 80-99% stenosis of left ICA 2D Echo EF 60-65%, normal atrial septum LDL 87 HgbA1c 5.5 VTE prophylaxis - lovenox aspirin 325 mg daily and clopidogrel 75 mg daily prior to admission, now on aspirin 325 mg daily and clopidogrel 75 mg daily.  Therapy recommendations:  pending Disposition:  pending  Hx of recent stroke 09/26/2021 she was admitted for right sided weakness and dysarthria, found down at home. CT showed subacute left BG, CR and caudate infarcts. CTA head and neck showed left ICA 80% stenosis, left M1 and M2 branch occlusion. MRI showed large left BG/CR infarct. CUS left ICA 80-99% stenosis. EF 60-65%, LDL 87 and A1C 5.5. VVS consulted and plan to do TCAR in 2 weeks of time. She was discharged to SNF with DAPT and crestor 40. Recommend BP goal 130-160 before carotid procedure  Left ICA severe stenosis 80% stenosis of left ICA seen on CTA Not a candidate for carotid intervention at this time given instability and severe neurodeficit Per RN, palliative care will be involved to Keep SBP 120-160 Recommend midodrine and taper off levophed as possible Outpatient follow-up with Dr. Trula Slade, vascular surgery on 10/23/2021  Pneumonia/Septic shock Sheryl [name] was admitted with worsening dyspnea and possible aspiration pneumonia Extubated 2/21 and tolerating well Levophed as needed to keep SBP  120-160 Antibiotics per primary team  Hypertension, now hypotensive Home meds:  none Unstable SBP goal 120-160 Levophed as needed to maintain BP goal Midodrine likely needed for BP management  Hyperlipidemia Home meds:  rosuvastatin 20 mg daily, increased to 40 mg daily in hospital LDL 87, goal < 70 Continue statin at discharge  Respiratory failure Sheryl [name] required intubation due to respiratory failure shortly after admission Extubated 2/21, tolerating well Continue antibiotics per primary team  Dysphagia N.p.o. after extubation Not able to pass a swallow Likely need to core track tomorrow for meds and nutrition  Tobacco abuse Current smoker Smoking cessation counseling will be provided  Other Stroke  Risk Factors Advanced Age >/= 69   Other Active Problems AKI Avoid contrast when possible Renally dose medications as appropriate  Hospital day # 3  Neurology will sign off. Please call with questions. Pt will follow up with stroke clinic NP at [facility] in about 4 weeks. Thanks for the consult.   Rosalin Hawking, MD PhD Stroke Neurology 10/10/2021 3:00 PM    To contact Stroke Continuity provider, please refer to [email or web address] After hours, contact General Neurology

## 2021-10-10 NOTE — Progress Notes (Signed)
OT Cancellation Note  Patient Details Name: Sheryl [name] MRN: [id number] DOB: August 12, 1951   Cancelled Treatment:    Reason Eval/Treat Not Completed: Medical issues which prohibited therapy (Continued to have a-line. Will return as schedule allows.)  [place], OTR/L Acute Rehab Pager: [phone] Office: [phone] 10/10/2021, 3:33 PM

## 2021-10-10 NOTE — Progress Notes (Signed)
Palliative-   Consult received- chart reviewed- plan to meet tomorrow morning at 10 for [place] discussion.   Mariana Kaufman, AGNP-C Palliative Medicine  Please call Palliative Medicine team phone with any questions [phone]. For individual providers please see AMION.  No charge

## 2021-10-10 NOTE — Procedures (Signed)
Extubation Procedure Note  Patient Details:   Name: TROI BECHTOLD DOB: [birth year] MRN: [id number]   Airway Documentation:    Vent end date: 10/10/21 Vent end time: 1125   Evaluation  O2 sats: stable throughout Complications: No apparent complications Patient did tolerate procedure well. Bilateral Breath Sounds: Diminished, Rhonchi   Yes  Pt extubated to 10L high flow [place]. Pt positive cuff leak and gag present. Will continue to monitor.  Theresia Bough 10/10/2021, 11:25 am

## 2021-10-10 NOTE — Progress Notes (Signed)
PT Cancellation Note  Patient Details Name: Sheryl [name] MRN: [id number] DOB: [birth year]   Cancelled Treatment:    Reason Eval/Treat Not Completed: Other (comment) Pt still with femoral a-line; discussed with critical care PA who plans to remove. Will follow up.  Wyona Almas, PT, DPT Acute Rehabilitation Services Pager [phone] Office [phone]    Deno Etienne 10/10/2021, 1:01 PM

## 2021-10-11 ENCOUNTER — Inpatient Hospital Stay (HOSPITAL_COMMUNITY): Payer: Medicare PPO

## 2021-10-11 DIAGNOSIS — J441 Chronic obstructive pulmonary disease with (acute) exacerbation: Secondary | ICD-10-CM

## 2021-10-11 DIAGNOSIS — Z515 Encounter for palliative care: Secondary | ICD-10-CM

## 2021-10-11 DIAGNOSIS — Z7189 Other specified counseling: Secondary | ICD-10-CM

## 2021-10-11 LAB — CBC
HCT: 24.3 % — ABNORMAL LOW (ref 36.0–46.0)
Hemoglobin: 7.4 g/dL — ABNORMAL LOW (ref 12.0–15.0)
MCH: 27.5 pg (ref 26.0–34.0)
MCHC: 30.5 g/dL (ref 30.0–36.0)
MCV: 90.3 fL (ref 80.0–100.0)
Platelets: 354 10*3/uL (ref 150–400)
RBC: 2.69 MIL/uL — ABNORMAL LOW (ref 3.87–5.11)
RDW: 15.2 % (ref 11.5–15.5)
WBC: 14.8 10*3/uL — ABNORMAL HIGH (ref 4.0–10.5)
nRBC: 0 % (ref 0.0–0.2)

## 2021-10-11 LAB — MAGNESIUM: Magnesium: 2 mg/dL (ref 1.7–2.4)

## 2021-10-11 LAB — BASIC METABOLIC PANEL
Anion gap: 14 (ref 5–15)
BUN: 22 mg/dL (ref 8–23)
CO2: 25 mmol/L (ref 22–32)
Calcium: 8.8 mg/dL — ABNORMAL LOW (ref 8.9–10.3)
Chloride: 109 mmol/L (ref 98–111)
Creatinine, Ser: 1.15 mg/dL — ABNORMAL HIGH (ref 0.44–1.00)
GFR, Estimated: 51 mL/min — ABNORMAL LOW (ref >60)
Glucose, Bld: 138 mg/dL — ABNORMAL HIGH (ref 70–99)
Potassium: 3.2 mmol/L — ABNORMAL LOW (ref 3.5–5.1)
Sodium: 148 mmol/L — ABNORMAL HIGH (ref 135–145)

## 2021-10-11 LAB — GLUCOSE, CAPILLARY
Glucose-Capillary: 129 mg/dL — ABNORMAL HIGH (ref 70–99)
Glucose-Capillary: 105 mg/dL — ABNORMAL HIGH (ref 70–99)
Glucose-Capillary: 125 mg/dL — ABNORMAL HIGH (ref 70–99)

## 2021-10-11 LAB — PROCALCITONIN: Procalcitonin: 1.96 ng/mL

## 2021-10-11 MED ORDER — GLYCOPYRROLATE 0.2 MG/ML IJ SOLN: 0.2000 mg | INTRAMUSCULAR | Status: DC | PRN

## 2021-10-11 MED ORDER — IPRATROPIUM-ALBUTEROL 0.5-2.5 (3) MG/3ML IN SOLN: 3.0000 mL | Freq: Four times a day (QID) | RESPIRATORY_TRACT | Status: DC | PRN

## 2021-10-11 MED ORDER — ORAL CARE MOUTH RINSE
15.0000 mL | Freq: Two times a day (BID) | OROMUCOSAL | Status: DC
  Administered 2021-10-11 (×2): 15 mL via OROMUCOSAL

## 2021-10-11 MED ORDER — POTASSIUM CHLORIDE 10 MEQ/50ML IV SOLN: 10.0000 meq | INTRAVENOUS | Status: DC

## 2021-10-11 MED ORDER — LORAZEPAM 2 MG/ML IJ SOLN: 1.0000 mg | INTRAMUSCULAR | Status: DC | PRN

## 2021-10-11 MED ORDER — POLYVINYL ALCOHOL 1.4 % OP SOLN
1.0000 [drp] | Freq: Four times a day (QID) | OPHTHALMIC | Status: DC | PRN
  Filled 2021-10-11: qty 15

## 2021-10-11 MED ORDER — BIOTENE DRY MOUTH MT LIQD: 15.0000 mL | OROMUCOSAL | Status: DC | PRN

## 2021-10-11 MED ORDER — MORPHINE SULFATE (PF) 2 MG/ML IV SOLN: 2.0000 mg | INTRAVENOUS | 0 refills | Status: AC | PRN

## 2021-10-11 MED ORDER — LORAZEPAM 2 MG/ML PO CONC: 1.0000 mg | ORAL | Status: DC | PRN

## 2021-10-11 MED ORDER — HALOPERIDOL LACTATE 2 MG/ML PO CONC
0.5000 mg | ORAL | Status: DC | PRN
  Filled 2021-10-11: qty 0.3

## 2021-10-11 MED ORDER — HALOPERIDOL 0.5 MG PO TABS
0.5000 mg | ORAL_TABLET | ORAL | Status: DC | PRN
  Filled 2021-10-11: qty 1

## 2021-10-11 MED ORDER — LORAZEPAM 2 MG/ML PO CONC
1.0000 mg | ORAL | 0 refills | Status: AC | PRN
Start: 2021-10-11 — End: ?

## 2021-10-11 MED ORDER — MORPHINE SULFATE (PF) 2 MG/ML IV SOLN
2.0000 mg | INTRAVENOUS | Status: DC | PRN
  Administered 2021-10-11 (×2): 2 mg via INTRAVENOUS
  Filled 2021-10-11 (×2): qty 1

## 2021-10-11 MED ORDER — HALOPERIDOL LACTATE 5 MG/ML IJ SOLN
0.5000 mg | INTRAMUSCULAR | Status: DC | PRN
  Administered 2021-10-11 (×2): 0.5 mg via INTRAVENOUS
  Filled 2021-10-11 (×2): qty 1

## 2021-10-11 MED ORDER — ONDANSETRON 4 MG PO TBDP
4.0000 mg | ORAL_TABLET | Freq: Four times a day (QID) | ORAL | Status: DC | PRN
  Filled 2021-10-11: qty 1

## 2021-10-11 MED ORDER — GLYCOPYRROLATE 1 MG PO TABS
1.0000 mg | ORAL_TABLET | ORAL | Status: DC | PRN
  Filled 2021-10-11: qty 1

## 2021-10-11 MED ORDER — LORAZEPAM 1 MG PO TABS: 1.0000 mg | ORAL_TABLET | ORAL | Status: DC | PRN

## 2021-10-11 MED ORDER — CHLORHEXIDINE GLUCONATE 0.12 % MT SOLN
15.0000 mL | Freq: Two times a day (BID) | OROMUCOSAL | Status: DC
  Filled 2021-10-11: qty 15

## 2021-10-11 MED ORDER — ONDANSETRON HCL 4 MG/2ML IJ SOLN
4.0000 mg | Freq: Four times a day (QID) | INTRAMUSCULAR | Status: DC | PRN
Start: 2021-10-11 — End: 2021-10-12

## 2021-10-11 MED ORDER — MORPHINE SULFATE (PF) 2 MG/ML IV SOLN
1.0000 mg | Freq: Four times a day (QID) | INTRAVENOUS | Status: DC
Start: 2021-10-11 — End: 2021-10-12

## 2021-10-11 MED ORDER — POTASSIUM CHLORIDE 10 MEQ/100ML IV SOLN
10.0000 meq | INTRAVENOUS | Status: DC
  Administered 2021-10-11: 10 meq via INTRAVENOUS
  Filled 2021-10-11 (×4): qty 100

## 2021-10-11 MED ORDER — MORPHINE SULFATE (PF) 2 MG/ML IV SOLN: 1.0000 mg | Freq: Four times a day (QID) | INTRAVENOUS | 0 refills | Status: AC

## 2021-10-11 MED ORDER — LORAZEPAM 1 MG PO TABS
1.0000 mg | ORAL_TABLET | ORAL | 0 refills | Status: AC | PRN
Start: 2021-10-11 — End: ?

## 2021-10-11 NOTE — TOC Transition Note (Addendum)
Transition of Care [facility]) - CM/SW Discharge Note   Patient Details  Name: Sheryl NARAYANAN MRN: [id number] Date of Birth: 28-May-1951  Transition of Care [facility]) CM/SW Contact:  Milas Gain, [address] Phone Number: 10/11/2021, 4:13 PM   Clinical Narrative:     Patient will DC to: [facility]  Anticipated DC date: 10/11/2021  Family notified: Sheryl [name]  Transport by: Sheryl [name] transport set up for 6:30pm  ?  Per MD patient ready for DC to [facility] . RN, patient, Sheryl [name] with [facility],patient's family, and facility notified of DC. Marland Kitchen RN given number for report [phone].DC packet on chart. DNR signed by MD attached to patients dc packet.Ambulance transport requested for patient.  CSW signing off.   Final next level of care: [place] ([place]) Barriers to Discharge: No Barriers Identified   Patient Goals and CMS Choice   CMS Medicare.gov Compare Post Acute Care list provided to:: Patient Represenative (must comment) (Patients daughter Sheryl [name]) Choice offered to / list presented to : Adult Children (patients daughter Sheryl [name])  Discharge Placement              Patient chooses bed at:  [facility]) Patient to be transferred to facility by: [place] Name of family member notified: Sheryl [name] Patient and family notified of of transfer: 10/11/21  Discharge Plan and Services In-house Referral: Clinical Social Work   Post Acute Care Choice: [place]                               Social Determinants of Health (SDOH) Interventions     Readmission Risk Interventions No flowsheet data found.

## 2021-10-11 NOTE — Progress Notes (Signed)
Patient was D/C to [facility] at 2044. She was transferred from bed to stretcher by [facility] staff. She was in no distress upon D/C. Belongings where taken by family/friend earlier today. Daughter, [name] was called and informed of transfer.

## 2021-10-11 NOTE — Consult Note (Signed)
Consultation Note Date: 10/11/2021   Patient Name: Sheryl [name]  DOB: 20-Dec-1950  MRN: [id number]  Age / Sex: 71 y.o., female  PCP: Binnie Rail, MD Referring Physician: Margaretha Seeds, MD  Reason for Consultation:   HPI/Patient Profile: 71 y.o. female  with past medical history of AECOPD, CKD III, ADD, tobacco use, recent stroke with residual right sided weakness and dysphagia- d/c'd to SNF-  admitted on 10/07/2021 with sepsis due to aspiration pneumonia and extension of her previous stroke.  Also concerns for deep space neck infection. She required intubation, but has been extubated. She has R sided neglect and hemiplegia, global aphasia, and dysphagia. Her chest xray today indicates increase in bilateral interstitial and airspace disease. Palliative medicine consulted for [place].    Primary Decision Maker HCPOA - daughter- Sheryl [name]  Discussion: I have reviewed medical records including EPIC notes, labs and imaging, assessed the patient and then met with Sheryl [name]- patient's daughter and also at a later time with patient's sister Sheryl [name]-  to discuss diagnosis prognosis, GOC, EOL wishes, disposition and options.  We discussed a brief life review of the patient. She is retired from [employer]. She is an Training and development officer- enjoyed Herbalist. She is known to be very funny, values her independence and privacy. 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   As far as functional and nutritional status- prior to her first admission for stroke- she was declining. She was able to ambulate only short distances before becoming SOB. Sheryl [name] was also noting changes in her memory and cognition- she had been referred to a neurologist but had not gone. Sheryl [name] recalls that Sheryl [name]'s life had gotten very hard and that Sheryl [name] had mentioned to her that she feared if she didn't make changes soon  that she would die.  We discussed patient's current illness and what it means in the larger context of patient's on-going co-morbidities. Sheryl [name] was in declining health prior to her initial stroke. Even with placement of a feeding tube she is likely to continue to aspirate and have recurrent pneumonias. She is not likely to return independent living. Natural disease trajectory and expectations at EOL were discussed.  I attempted to elicit values and goals of care important to the patient. Sheryl [name] expressed that Sheryl [name] would not want a future that included living in a SNF, being dependent on others for care, she would not want a feeding tube. They do not feel that her current status and what her future care would require is a quality of life that Sheryl [name] would choose for herself.   The difference between aggressive medical intervention and comfort care was considered in light of the patient's goals of care. Sheryl [name] and Sheryl [name]'s sister Sheryl [name] agreed that comfort care would be more in line with Sheryl [name] GOC.   Hospice and Palliative Care services outpatient were explained and offered. Family would like to seek placement at [facility].   Questions and concerns were addressed. The family was encouraged to call with questions  or concerns.   SUMMARY OF RECOMMENDATIONS -Aspiration pnuemonia- sepsis resolved however chest xray worse today- in the setting of extension of CVA with R hemiplegia, bedbound status, global aphasia- family elects for comfort measures only -D/C IV fluids, antibiotics, labs and other interventions that are not necessary for comfort -TOC referral for hospice house placement -Symptom management-  -Start low dose- 25m IV morphine for dypsnea prophylaxis as well as relief from cough- also utilize 266mIV q1hr prn for worsening dyspnea, air hunger, or any sign of agitation- can start drip if frequent dosing is required -Lorazepam 36m22mV q4hr prn for sleep or anxiety -Robinul  .2mg53m q4hr prn for secretions     Code Status/Advance Care Planning: DNR   Prognosis:   < 2 weeks  Discharge Planning: Hospice facility  Primary Diagnoses: Present on Admission:  Sepsis due to pneumonia (HCC)[place] respiratory failure with hypoxia (HCC)[place] left MCA stroke (HCC)[place]  COPD exacerbation (HCC)[place]  Aspiration pneumonia (HCC)[place] [facility] [place]  Physical Exam  Vital Signs: BP 136/67    Pulse (!) 107    Temp 97.7 F (36.5 C)    Resp (!) 21    Ht 5' 1"  (1.549 m)    Wt 64.5 kg    SpO2 93%    BMI 26.87 kg/m  Pain Scale: 0-10 POSS *See Group Information*: S-Acceptable,Sleep, easy to arouse Pain Score: 0-No pain (patient nods head no to no pain)   SpO2: SpO2: 93 % O2 Device:SpO2: 93 % O2 Flow Rate: .O2 Flow Rate (L/min): 6 L/min  IO: Intake/output summary:  Intake/Output Summary (Last 24 hours) at 10/11/2021 1306 Last data filed at 10/11/2021 1200 Gross per 24 hour  Intake 474.88 ml  Output 950 ml  Net -475.12 ml    LBM: Last BM Date : 10/10/21 Baseline Weight: Weight: 64 kg Most recent weight: Weight: 64.5 kg     Palliative Assessment/Data: PPS: 10%       Thank you for this consult. Palliative medicine will continue to follow and assist as needed.   Total time: 210 minutes  Signed by: KasiMariana KaufmanNP-C Palliative Medicine    Please contact Palliative Medicine Team phone at [phone] questions and concerns.  For individual provider: See AmioShea Evans

## 2021-10-11 NOTE — Progress Notes (Addendum)
CSW received consult from Md and palliative NP that family requesting referral for patient to [facility]. CSW called patients daughter [name]. [name] confirmed and gave CSW permission to make referral to [facility] for [facility] place for patient. CSW called [facility] and spoke with [facility] with [facility] and [facility]. CSW will continue to follow and assist with patients dc planning needs.

## 2021-10-11 NOTE — Evaluation (Signed)
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 2/18 ICU intubated 2/19 pupil change L MCA extension trace R midline shift mild petechial hemorrhage  2/21 extubated   PMH COPD 2L , CKD III, Chronic tobacco abuse  ADD L MCA CVA, MDDD L TKA, R reverse TSA HOH   Clinical Impression   PT admitted with sepsis PNA and extension L MCA with R midline shift. Pt currently with functional limitiations due to the deficits listed below (see OT problem list). Recommendation for [facility] lift only with RN/ CNA staff. Pt currently with dense R hemiplegia. Recommendation for resting hand splint R UE with edema noted. OT to follow for splint needs.  Pt will benefit from skilled OT to increase their independence and safety with adls and balance to allow discharge SNF.       Recommendations for follow up therapy are one component of a multi-disciplinary discharge planning process, led by the attending physician.  Recommendations may be updated based on patient status, additional functional criteria and insurance authorization.   Follow Up Recommendations  Skilled nursing-short term rehab (<3 hours/day)    Assistance Recommended at Discharge    Patient can return home with the following Two people to help with walking and/or transfers;Two people to help with bathing/dressing/bathroom    Functional Status Assessment  Patient has had a recent decline in their functional status and demonstrates the ability to make significant improvements in function in a reasonable and predictable amount of time.  Equipment Recommendations  Other (comment);Hospital bed;Wheelchair cushion (measurements OT);Wheelchair (measurements OT) Product manager)    Recommendations for  Other Services Speech consult     Precautions / Restrictions Precautions Precautions: Fall Precaution Comments: R hemiparesis      Mobility Bed Mobility Overal bed mobility: Needs Assistance Bed Mobility: Supine to Sit, Rolling, Sit to Supine Rolling: Max assist, +2 for physical assistance, +2 for safety/equipment   Supine to sit: Mod assist, +2 for physical assistance, +2 for safety/equipment, HOB elevated Sit to supine: Max assist, +2 for physical assistance, +2 for safety/equipment   General bed mobility comments: pt progressed to R side of bed sitting with (A) to elevate trunk from bed surface. pt no activation on R side to (A) . pt noted to have R side rib collapsed with L side rib flare static sitting. pt able to follow trunk extension cues for posture. pt with R bias sitting. pt reaching with L UE with hand over hand cues. pt requires (A) total for bil LE in and out of bed    Transfers Overall transfer level: Needs assistance   Transfers: Sit to/from Stand Sit to Stand: +2 safety/equipment, +2 physical assistance, Max assist Stand pivot transfers: +2 physical assistance, Total assist, +2 safety/equipment         General transfer comment: pt requires R LE blocking. pt pad used to help tranfer L toward chair. recommendation for RN to use hoyer lift only      Balance Overall balance assessment: Needs assistance Sitting-balance support: Single extremity supported, Feet supported Sitting balance-Leahy Scale: Poor                                     ADL  either performed or assessed with clinical judgement   ADL Overall ADL's : Needs assistance/impaired Eating/Feeding: NPO   Grooming: Moderate assistance;Bed level   Upper Body Bathing: Maximal assistance;Sitting   Lower Body Bathing: Total assistance   Upper Body Dressing : Maximal assistance   Lower Body Dressing: Maximal assistance   Toilet Transfer: +2 for physical assistance;Maximal  assistance;Squat-pivot;Requires drop arm             General ADL Comments: pt progressed from bed to chair this session     Vision Baseline Vision/History: 1 Wears glasses Vision Assessment?: Yes Eye Alignment: Within Functional Limits Ocular Range of Motion: Restricted on the right Alignment/Gaze Preference: Head turned;Gaze left Visual Fields: Right visual field deficit Additional Comments: pt responding to objects in L visual field. pt does not respond in R visual field. Pt self occluding L eye at times during session to utilize only the R eye. pt shakes head "NO" when asked multiple times if she sees two double. pt does not mimic 2 fingers when asked with L hand     Perception     Praxis      Pertinent Vitals/Pain Pain Assessment Pain Assessment: No/denies pain     Hand Dominance Left   Extremity/Trunk Assessment Upper Extremity Assessment Upper Extremity Assessment: RUE deficits/detail RUE Deficits / Details: R UE dense flaccid hemiplegia no subluxation noted RUE Sensation: decreased light touch;decreased proprioception RUE Coordination: decreased fine motor;decreased gross motor   Lower Extremity Assessment Lower Extremity Assessment: Defer to PT evaluation   Cervical / Trunk Assessment Cervical / Trunk Assessment: Kyphotic   Communication Communication Communication: Expressive difficulties   Cognition Arousal/Alertness: Awake/alert Behavior During Therapy: Flat affect Overall Cognitive Status: Impaired/Different from baseline Area of Impairment: Orientation, Attention, Memory, Following commands, Safety/judgement, Awareness, Problem solving                 Orientation Level: Disoriented to, Place, Time, Situation, Person Current Attention Level: Sustained Memory: Decreased recall of precautions, Decreased short-term memory Following Commands: Follows one step commands with increased time, Follows one step commands inconsistently Safety/Judgement:  Decreased awareness of safety, Decreased awareness of deficits Awareness: Intellectual Problem Solving: Slow processing, Difficulty sequencing, Decreased initiation General Comments: Pt following 75% of commands in L visual field. Pt does not scan past midline. pt following voices in room with head turns toward sound.     General Comments  6L [place] VSS stable    Exercises Exercises: Other exercises Other Exercises Other Exercises: PROM of digits, elbow and shoulder R UE   Shoulder Instructions      Home Living Family/patient expects to be discharged to:: Skilled nursing facility Living Arrangements: Alone Available Help at Discharge: Family;Available 24 hours/day Type of Home: House Home Access: Stairs to enter [employer] of Steps: 4 Entrance Stairs-Rails: Right;Left Home Layout: Two level Alternate Level Stairs-Number of Steps: flight Alternate Level Stairs-Rails: Right;Left Bathroom Shower/Tub: Teacher, early years/pre: Standard     Home Equipment: None   Additional Comments: home information from prior admission records. Pt d/c to snf and will likely need SNF level again this admission      Prior Functioning/Environment Prior Level of Function : Independent/Modified Independent;Driving;Other (comment)                        OT Problem List: Impaired balance (sitting and/or standing);Decreased coordination;Impaired UE functional use;Decreased knowledge of use of DME or AE;Decreased strength;Decreased range of motion;Decreased activity tolerance;Impaired vision/perception;Decreased cognition;Decreased safety awareness;Cardiopulmonary  status limiting activity;Increased edema      OT Treatment/Interventions: Self-care/ADL training;Therapeutic exercise;Neuromuscular education;Energy conservation;DME and/or AE instruction;Manual therapy;Modalities;Splinting;Therapeutic activities;Cognitive remediation/compensation;Visual/perceptual  remediation/compensation;Patient/family education;Balance training    OT Goals(Current goals can be found in the care plan section) Acute Rehab OT Goals Patient Stated Goal: none stated OT Goal Formulation: Patient unable to participate in goal setting Time For Goal Achievement: 10/25/21 Potential to Achieve Goals: Good  OT Frequency: Min 2X/week    Co-evaluation PT/OT/SLP Co-Evaluation/Treatment: Yes Reason for Co-Treatment: For patient/therapist safety;Necessary to address cognition/behavior during functional activity;Complexity of the patient's impairments (multi-system involvement);To address functional/ADL transfers   OT goals addressed during session: ADL's and self-care;Proper use of Adaptive equipment and DME;Strengthening/ROM      AM-PAC OT "6 Clicks" Daily Activity     Outcome Measure Help from another person eating meals?: A Lot Help from another person taking care of personal grooming?: A Lot Help from another person toileting, which includes using toliet, bedpan, or urinal?: Total Help from another person bathing (including washing, rinsing, drying)?: A Lot Help from another person to put on and taking off regular upper body clothing?: A Lot Help from another person to put on and taking off regular lower body clothing?: Total 6 Click Score: 10   End of Session Equipment Utilized During Treatment: Gait belt;Oxygen Nurse Communication: Mobility status;Need for lift equipment;Precautions  Activity Tolerance: Patient tolerated treatment well Patient left: in chair;with call bell/phone within reach;with chair alarm set;with nursing/sitter in room  OT Visit Diagnosis: Unsteadiness on feet (R26.81);Muscle weakness (generalized) (M62.81);Hemiplegia and hemiparesis Hemiplegia - Right/Left: Right Hemiplegia - dominant/non-dominant: Non-Dominant Hemiplegia - caused by: Cerebral infarction                Time: 2947-6546 OT Time Calculation (min): 28 min Charges:  OT General  Charges $OT Visit: 1 Visit OT Evaluation $OT Eval Moderate Complexity: 1 Mod   Brynn, OTR/L  Acute Rehabilitation Services Pager: [phone] Office: [phone] .   Jeri Modena 10/11/2021, 11:03 AM

## 2021-10-11 NOTE — Progress Notes (Signed)
1245 Received shift report, Tawanna Sat, RN stating pt is now full comfort care. Pt resting in recliner chair, surrounded by 2 friends. No needs at this time, WCTM.   1330 Pt's friends called RN to room. Tizza, NT and RN transferred pt from chair to bed. Pt's mouth swabbed, medical equipment discontinued per orders, WCTM.  1400 Pt's friend notified RN that pt is very agitated. RN to room to assess pt. Pt is waving left arm around, increased respirations noted. Pt medicated per MAR.

## 2021-10-11 NOTE — Progress Notes (Signed)
Park Liter took home picture frame.

## 2021-10-11 NOTE — Progress Notes (Signed)
Hospital Discharge Summary:  Ms. Sheryl [name] is a 71 year old female with chronic hypoxemic respiratory failure on home 2L O2, COPD, stage III CKD, left ICA occlusion and recent left MCA stroke who was admitted for acute hypoxemic respiratory failure secondary to aspiration. In the ED she was treated with broad spectrum antibiotics, nebulizers, steroids. Intubated for respiratory failure in the ICU. Neurology consulted for concern for acute stroke and imaging demonstrated extension of prior left MCA involving left frontal, parietal, interior temporal lobes and insula. In the ICU she required vasopressors for septic shock which resolved. She was extubated on 10/10/21 and tolerated [place]. Patient with right sided hemiplegia neglect and aphasia. Unfortunately her pneumonia on chest radiograph worsening. Family discussed with Palliative care team goals of care and after meeting, family has elected to hospice.  Referral has been placed and hospice eligibility pending.

## 2021-10-11 NOTE — Consult Note (Signed)
° °  [facility] CM Inpatient Consult   10/11/2021  Sheryl [name] 1951/03/10 [id number]  [facility] [ACO] Patient: Sheryl [name]  Primary Care Provider:  Binnie Rail, MD is an Embedded Provider with [facility] Cass[facility]  Patient in ICU  Patient screened for transitioning from the hospital with noted less than 7 days readmission with high risk score for unplanned readmission risk and  to assess for potential [facility] service needs for post hospital transition.  Review of patient's medical record reveals patient is for transitioning to a hospice home.   Plan:  No post hospital follow up needs from [facility] needed. Will sign off.  For questions contact:   Natividad Brood, RN BSN [facility] Liaison  [phone] business mobile phone Toll free office [phone]  Fax number: [phone] [email or web address]

## 2021-10-11 NOTE — Progress Notes (Signed)
This chaplain responded to PMT consult for EOL spiritual care. The chaplain is appreciative of the PMT update on the Pt. story.   The chaplain notices the Pt. is less restless as we talk about the Pt. pottery, creativity, and explore the color of the [facility] in her room.   The Pt. is able to close her eyes as the chaplain asks the angels to stay close to the Pt. as the chaplain leaves.  Chaplain Sallyanne Kuster [phone]

## 2021-10-11 NOTE — Progress Notes (Addendum)
Manufacturing engineer [facility]) Hospital Liaison Note  Referral received from [facility] for patient/family interest in [facility]. Chart under review by [facility] physician.   Hospice eligibility confirmed.   Bed offered and accepted at [facility] for transfer today. Unit RN please call report to [phone] prior to patient leaving the unit.   Please call with any questions or concerns. Thank you  Roselee Nova, [facility] Liaison [phone]

## 2021-10-11 NOTE — Evaluation (Signed)
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 2/18 ICU intubated 2/19 pupil change L MCA extension trace R midline shift mild petechial hemorrhage  2/21 extubated   PMH COPD 2L [place], CKD III, Chronic tobacco abuse  ADD L MCA CVA, MDDD L TKA, R reverse TSA HOH  Clinical Impression  Pt admitted with above. Presents with aphasia, right hemiparesis, right hemianopia/neglect, and poor sitting balance. Pt able to communicate with ~75% accuracy with yes/no questions. Can visually track to midline with cueing. Requiring two person mod-max assist for bed mobility and total assist for squat pivot transfer from bed to chair to left. Recommendation for maxi sky lift for nursing staff. Will continue to follow acutely.      Recommendations for follow up therapy are one component of a multi-disciplinary discharge planning process, led by the attending physician.  Recommendations may be updated based on patient status, additional functional criteria and insurance authorization.  Follow Up Recommendations Skilled nursing-short term rehab (<3 hours/day)    Assistance Recommended at Discharge Frequent or constant Supervision/Assistance  Patient can return home with the following  Two people to help with walking and/or transfers;Two people to help with bathing/dressing/bathroom;Assistance with cooking/housework;Assist for transportation;Help with stairs or ramp for entrance    Equipment Recommendations Other (comment) (defer)  Recommendations for Other Services       Functional Status Assessment Patient has had a recent decline in their functional status and demonstrates the ability to make significant improvements in function in a reasonable and  predictable amount of time.     Precautions / Restrictions Precautions Precautions: Fall Precaution Comments: R hemiparesis, hemianopia Restrictions Weight Bearing Restrictions: No      Mobility  Bed Mobility Overal bed mobility: Needs Assistance Bed Mobility: Supine to Sit, Rolling, Sit to Supine Rolling: Max assist, +2 for physical assistance, +2 for safety/equipment   Supine to sit: Mod assist, +2 for physical assistance, +2 for safety/equipment, HOB elevated Sit to supine: Max assist, +2 for physical assistance, +2 for safety/equipment   General bed mobility comments: pt progressed to R side of bed sitting with (A) to elevate trunk from bed surface. pt no activation on R side to (A) . pt noted to have R side rib collapsed with L side rib flare static sitting. pt able to follow trunk extension cues for posture. pt with R bias sitting. pt reaching with L UE with hand over hand cues. pt requires (A) total for bil LE in and out of bed    Transfers Overall transfer level: Needs assistance   Transfers: Bed to chair/wheelchair/BSC       Squat pivot transfers: Total assist, +2 physical assistance, +2 safety/equipment     General transfer comment: pt requires R LE blocking. pt pad used to help tranfer L toward chair. recommendation for RN to use hoyer lift only    Ambulation/Gait                  Stairs            Wheelchair Mobility    Modified Rankin (Stroke Patients Only)       Balance Overall balance assessment: Needs assistance Sitting-balance support: Single extremity supported, Feet supported Sitting balance-Leahy Scale: Poor  Pertinent Vitals/Pain Pain Assessment Pain Assessment: Faces Faces Pain Scale: No hurt    Home Living Family/patient expects to be discharged to:: Skilled nursing facility Living Arrangements: Alone Available Help at Discharge: Family;Available 24 hours/day Type of  Home: House Home Access: Stairs to enter Entrance Stairs-Rails: Psychiatric nurse of Steps: 4 Alternate Level Stairs-Number of Steps: flight Home Layout: Two level Home Equipment: None Additional Comments: home information from prior admission records. Pt d/c to snf and will likely need SNF level again this admission    Prior Function Prior Level of Function : Independent/Modified Independent;Driving;Other (comment)                     Hand Dominance   Dominant Hand: Left    Extremity/Trunk Assessment   Upper Extremity Assessment Upper Extremity Assessment: RUE deficits/detail RUE Deficits / Details: R UE dense flaccid hemiplegia no subluxation noted RUE Sensation: decreased light touch;decreased proprioception RUE Coordination: decreased fine motor;decreased gross motor    Lower Extremity Assessment Lower Extremity Assessment: RLE deficits/detail RLE Deficits / Details: Flicker of quad contraction with tactile cueing for LAQ, otherwise no active movement noted. Able to reach neutral ankle DF passively    Cervical / Trunk Assessment Cervical / Trunk Assessment: Kyphotic  Communication   Communication: Expressive difficulties  Cognition Arousal/Alertness: Awake/alert Behavior During Therapy: Flat affect Overall Cognitive Status: Impaired/Different from baseline Area of Impairment: Orientation, Attention, Memory, Following commands, Safety/judgement, Awareness, Problem solving                 Orientation Level: Disoriented to, Place, Time, Situation, Person Current Attention Level: Sustained Memory: Decreased recall of precautions, Decreased short-term memory Following Commands: Follows one step commands with increased time, Follows one step commands inconsistently Safety/Judgement: Decreased awareness of safety, Decreased awareness of deficits Awareness: Intellectual Problem Solving: Slow processing, Difficulty sequencing, Decreased  initiation General Comments: Pt following 75% of commands in L visual field. Pt does not scan past midline. pt following voices in room with head turns toward sound.        General Comments General comments (skin integrity, edema, etc.): SpO2 92% on 6L O2, HR 107, BP 164/79 (104) sitting EOB    Exercises Other Exercises Other Exercises: PROM of digits, elbow and shoulder R UE   Assessment/Plan    PT Assessment Patient needs continued PT services  PT Problem List Decreased strength;Decreased activity tolerance;Decreased balance;Decreased mobility;Decreased coordination;Decreased knowledge of use of DME       PT Treatment Interventions DME instruction;Gait training;Functional mobility training;Therapeutic activities;Therapeutic exercise;Balance training;Neuromuscular re-education;Patient/family education    PT Goals (Current goals can be found in the Care Plan section)  Acute Rehab PT Goals Patient Stated Goal: unable to state PT Goal Formulation: Patient unable to participate in goal setting Time For Goal Achievement: 10/25/21 Potential to Achieve Goals: Fair    Frequency Min 3X/week     Co-evaluation PT/OT/SLP Co-Evaluation/Treatment: Yes Reason for Co-Treatment: Complexity of the patient's impairments (multi-system involvement);Necessary to address cognition/behavior during functional activity;For patient/therapist safety;To address functional/ADL transfers PT goals addressed during session: Mobility/safety with mobility;Balance OT goals addressed during session: ADL's and self-care;Proper use of Adaptive equipment and DME;Strengthening/ROM       AM-PAC PT "6 Clicks" Mobility  Outcome Measure Help needed turning from your back to your side while in a flat bed without using bedrails?: A Lot Help needed moving from lying on your back to sitting on the side of a flat bed without using bedrails?: Total Help needed moving to and  from a bed to a chair (including a wheelchair)?:  Total Help needed standing up from a chair using your arms (e.g., wheelchair or bedside chair)?: Total Help needed to walk in hospital room?: Total Help needed climbing 3-5 steps with a railing? : Total 6 Click Score: 7    End of Session Equipment Utilized During Treatment: Gait belt Activity Tolerance: Patient tolerated treatment well Patient left: in chair;with call bell/phone within reach;with chair alarm set Nurse Communication: Mobility status;Need for lift equipment PT Visit Diagnosis: Unsteadiness on feet (R26.81);Muscle weakness (generalized) (M62.81);Hemiplegia and hemiparesis Hemiplegia - Right/Left: Right Hemiplegia - dominant/non-dominant: Dominant Hemiplegia - caused by: Cerebral infarction    Time: 2353-6144 PT Time Calculation (min) (ACUTE ONLY): 32 min   Charges:   PT Evaluation $PT Eval Moderate Complexity: 1 Mod          Wyona Almas, PT, DPT Acute Rehabilitation Services Pager [phone] Office [phone]   Deno Etienne 10/11/2021, 12:11 PM

## 2021-10-11 NOTE — Progress Notes (Signed)
NAME:  EVANTHIA MAUND, MRN:  [id number], DOB:  09-06-1950, LOS: 4 ADMISSION DATE:  10/07/2021, CONSULTATION DATE:  10/07/21 REFERRING MD:  Lorin Mercy, CHIEF COMPLAINT:  increased work of breathing   History of Present Illness:  20yF with history of COPD on 2L [place], CKD stage III, chronic tobacco abuse, ADD, recent admission for left ICA 80% stenosis, left M1 occlusion with M2 branch reconstitution with possible inferior left M2 stenosis/short segment occlusion with residual right sided weakness. She was started on DAPT with plan for TCAR with vascular surgery in a few weeks, discharged to [facility] 2/13. She developed gradually worsening dyspnea over last several days and was started on levaquin in her facility with concern for pneumonia- pt and family believe it is due to aspiration.   Also developed rash under her jaw and some swelling - there was potentially concern for allergic reaction to antibiotic at facility.   In ED she was given vanc, zosyn and sepsis bolus. She had increased work of breathing on NRB, given duonebs, solumedrol, and trialed on [facility] but ultimately intubated on arrival to ICU.   Pertinent  Medical History  COPD  Chronic hypoxemic respiratory failure on 2L [place] L MCA CVA ICA stenosis Smoking  Significant Hospital Events: Including procedures, antibiotic start and stop dates in addition to other pertinent events   10/07/21 admitted to ICU, intubation, arterial line. CT neck with possible prevertebral soft tissue stranding but no drainable collection, CT chest with multifocal aspiration/pneumonia 2/19 neuro consulted for possible pupil change and concern for stroke. 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 CTA . Persistent proximal left M1 occlusion, similar to previous. However, there has been interval occlusion of a downstream left  M2 branch, with decreased perfusion within the infarcted left MCA territory as compared to previous.2. Severe approximate 80% stenosis about the left carotid bulb/proximal cervical left ICA, similar to previous. 3. Additional mild atheromatous change elsewhere about the major arterial vasculature of the head and neck. No other hemodynamically. UEs assessed for PICC. Vessels too small diameter. Placed on PCV for vent synchrony  2/20 hgb down to 6.5 got 1 unit PRBC , still on pressors but also on fairly heavy sedation w. Room to wean pressors. Too sedated to extubate cxr a little worse. Stopped prop and fent changed to precedex. Discussed plan of care w/ daughter she does not want trach. Understands could be looking at hospice of fails extubation  2/21 more awake off sedating gtts but still not awake enough to extubate. CXR better. Adjusting precedex ceiling down. Social work consulted for placement (family not wanting her to return to SNF she was at) adding Midodrine. Discussed w/ neuro and changing goal SBP to > 120    Interim History / Subjective:  Extubated yesterday Oxygen weaned to 6L Able to follow commands, expressive aphasia   Objective   Blood pressure 128/76, pulse (!) 104, temperature 98.1 F (36.7 C), temperature source Axillary, resp. rate (!) 23, height 5\' 1"  (1.549 m), weight 64.5 kg, SpO2 92 %.        Intake/Output Summary (Last 24 hours) at 10/11/2021 0920 Last data filed at 10/11/2021 0428 Gross per 24 hour  Intake 76.26 ml  Output 1175 ml  Net -1098.74 ml   Filed Weights   10/09/21 0349 10/10/21 0400 10/11/21 0500  Weight: 62.6 kg 63.8 kg 64.5  kg   Physical Exam: General: Elderly and chronically ill-appearing, no acute distress HENT: [place], AT, OP clear, MMM Eyes: EOMI, no scleral icterus Respiratory: Diminished breath sounds bilaterally.  No crackles, wheezing or rales Cardiovascular: RRR, -M/R/G, no JVD GI: BS+, soft,  nontender Extremities:-Edema,-tenderness Neuro: Awake and alert, follows commands, right hemiparesis, expressive aphasia  CXR 10/11/21 - Bilateral interstitial opacities, worsening  Improving leukocytosis Stable CKD IIIa  Resolved Hospital Problem list     Assessment & Plan:   Principal Problem:   Aspiration pneumonia ([place]) Active Problems:   Hyperlipidemia   COPD exacerbation ([place])   Anemia   Acute ischemic left MCA stroke (HCC)   Sepsis due to pneumonia ([place])   Acute respiratory failure with hypoxia ([place])   Dysphagia as late effect of cerebrovascular accident (CVA)   Septic shock (HCC)   Hypotension due to drugs   AKI (acute kidney injury) ([place])   Metabolic encephalopathy   ICAO (internal carotid artery occlusion)   Acute and chronic respiratory failure with hypoxia (HCC)   DNR (do not resuscitate)    Acute on chronic hypoxic hypercapnic respiratory failure due Aspiration pneumonia and possible  AECOPD, baseline PFTs however do not show obstruction -Respiratory culture neg. CXR worse suggesting pneumonia  Plan Discussed w/ daughter 2/20 she will be one-way extubation and if fails would transition to comfort.  Extubated 2/22 and currently on [place] Wean supplemental O2 for goal SpO2 >88% Continue 7-10 day course of antibiotics  Septic shock  due to aspiration pneumonia c/b drug induced hypotension from sedation - shock resolved Also concern for possible deep space neck infection: has h/o poor dentition but aspiration likely result of recent extension of stroke  -cbc remains elevated.  -CTa completed and did not raise concern for previously mentioned deep space infection of neck -cultures neg wbc and PCT trending down  Plan Abx per above Weaning pressors SBP > 120  Add midodrine   Subacute stroke due to extension of left MCA stroke and Anisocoria from prior known ICA occlusion Plan Cont DAPT and statin  Supportive care Goal SBP > 120 Adding midodrine   AKI -  resolved CKD IIIa Plan Avoiding hypotension Renal dose meds Trend BMET  Fluid and electrolyte imbalance: hypokalemia.  Plan Replete IV K. No enteral access  Mild anemia w/out evidence of bleeding Plan Trend cbc Trigger for transfusion < 7  DM  Hypoglycemia Plan Currently on D5 1/2 NS. Plan to discontinue once we start TF if that is the goal SSI Cont current w/ goal 140-180   MDD/mood disorder: Plan Cont prozac  GOC Palliative following with family meeting planned for today Pending meeting will determine if enteral access needed for nutrition  Best Practice (right click and "Reselect all SmartList Selections" daily)   Diet/type: tubefeeds -need enteral access DVT prophylaxis: LMWH GI prophylaxis: H2B Lines: Arterial Line Foley:  Yes, and it is still needed Code Status:  DNR Last date of multidisciplinary goals of care discussion 2/20 see above  Dispo: Transfer to [facility] 2/23  Critical care time: N/A   Care Time: 55 min  Rodman Pickle, M.D. [facility] Pulmonary/Critical Care Medicine 10/11/2021 9:20 AM   Please see Amion for pager number to reach on-call Pulmonary and Critical Care Team.

## 2021-10-11 NOTE — Discharge Summary (Signed)
Physician Discharge Summary  Patient ID: Sheryl [name] MRN: [id number] DOB/AGE: 1951/05/31 71 y.o.  Admit date: 10/07/2021 Discharge date: 10/11/2021  Admission Diagnoses:  Discharge Diagnoses:  Principal Problem:   Aspiration pneumonia ([place]) Active Problems:   Hyperlipidemia   COPD exacerbation ([place])   Anemia   Acute ischemic left MCA stroke (HCC)   Sepsis due to pneumonia ([place])   Acute respiratory failure with hypoxia ([place])   Dysphagia as late effect of cerebrovascular accident (CVA)   Septic shock (HCC)   Hypotension due to drugs   AKI (acute kidney injury) ([place])   Metabolic encephalopathy   ICAO (internal carotid artery occlusion)   Acute and chronic respiratory failure with hypoxia ([place])   DNR (do not resuscitate)   Discharged Condition: fair  Hospital Course:  Sheryl [name] is a 71 year old female with chronic hypoxemic respiratory failure on home 2L O2, COPD, stage III CKD, left ICA occlusion and recent left MCA stroke who was admitted for acute hypoxemic respiratory failure secondary to aspiration. In the ED she was treated with broad spectrum antibiotics, nebulizers, steroids. Intubated for respiratory failure in the ICU. Neurology consulted for concern for acute stroke and imaging demonstrated extension of prior left MCA involving left frontal, parietal, interior temporal lobes and insula. In the ICU she required vasopressors for septic shock which resolved. She was extubated on 10/10/21 and tolerated [place]. Patient with right sided hemiplegia neglect and aphasia. Unfortunately her pneumonia on chest radiograph worsening. Family discussed with Palliative care team goals of care and after meeting, family has elected to hospice.    Consults: None  Significant Diagnostic Studies: Imaging MR Brain 10/08/21 1. Large acute/subacute left MCA territory infarct. The extent of infarct is increased relative to February 8 MRI. In addition to the evolving left basal ganglia  infarct, there now is acute or subacute large/confluent infarct involving the left frontal, parietal, and anterior temporal lobes, and insula. 2. Associated edema with trace (1-2 mm) rightward midline shift. 3. Probable mild petechial hemorrhage without mass occupying hemorrhagic transformation. 4. The left MCA flow void is absent, compatible with M1 MCA occlusion seen on prior CTA.  CT Chest 10/07/21 1. Extensive bilateral heterogeneous and ground-glass airspace opacity, which is more dense and consolidative in the dependent bilateral lung bases. Trace associated left pleural effusions. Findings are generally consistent with multifocal infection without no significant debris within the airways to suggest overt aspiration. 2. Endotracheal and esophagogastric intubation. 3. Emphysema. 4. Coronary artery disease. 5. Hepatic steatosis.  Treatments: IV hydration, antibiotics: Zosyn, and respiratory therapy: O2  Discharge Exam: Blood pressure (!) 162/77, pulse 95, temperature 97.7 F (36.5 C), resp. rate (!) 23, height 5\' 1"  (1.549 m), weight 64.5 kg, SpO2 94 %. Physical Exam: General: Elderly and chronically ill-appearing, no acute distress HENT: [place], AT, OP clear, MMM Eyes: EOMI, no scleral icterus Respiratory: Diminished breath sounds bilaterally.  No crackles, wheezing or rales Cardiovascular: RRR, -M/R/G, no JVD GI: BS+, soft, nontender Extremities:-Edema,-tenderness Neuro: Awake and alert, follows commands, right hemiparesis, expressive aphasia  Disposition:  Discharge disposition: 51-Hospice/Medical Facility       Discharge Instructions     Ambulatory referral to Neurology   Complete by: As directed    Follow up with stroke clinic NP (Jessica Vanschaick or Cecille Rubin, if both not available, consider Zachery Dauer, or [place]) at [facility] in about 4 weeks. Thanks.   No wound care   Complete by: As directed       Allergies as of 10/11/2021  Reactions    Macrobid [nitrofurantoin Monohyd Macro] Nausea And Vomiting, Other (See Comments)   GI upset, sedation, dry mouth, headaches, and "Allergic," per paperwork from [facility]        Medication List     STOP taking these medications    albuterol 108 (90 Base) MCG/ACT inhaler Commonly known as: VENTOLIN HFA   amLODipine 5 MG tablet Commonly known as: NORVASC   aspirin 325 MG EC tablet   atomoxetine 100 MG capsule Commonly known as: STRATTERA   Breztri Aerosphere 160-9-4.8 MCG/ACT Aero Generic drug: Budeson-Glycopyrrol-Formoterol   clopidogrel 75 MG tablet Commonly known as: PLAVIX   cyclobenzaprine 10 MG tablet Commonly known as: FLEXERIL   FLUoxetine 20 MG capsule Commonly known as: PROZAC   FLUoxetine 20 MG tablet Commonly known as: PROZAC   FLUoxetine 40 MG capsule Commonly known as: PROZAC   fluticasone 50 MCG/ACT nasal spray Commonly known as: FLONASE   food thickener Gel Commonly known as: SIMPLYTHICK (NECTAR/LEVEL 2/MILDLY THICK)   levofloxacin 750 MG tablet Commonly known as: LEVAQUIN   losartan 100 MG tablet Commonly known as: COZAAR   meclizine 25 MG tablet Commonly known as: ANTIVERT   nicotine 14 mg/24hr patch Commonly known as: NICODERM CQ - dosed in mg/24 hours   omeprazole 20 MG capsule Commonly known as: PRILOSEC   OXYGEN   promethazine 25 MG/ML injection Commonly known as: PHENERGAN   rOPINIRole 0.25 MG tablet Commonly known as: REQUIP   rosuvastatin 20 MG tablet Commonly known as: Crestor   traZODone 50 MG tablet Commonly known as: DESYREL       TAKE these medications    LORazepam 1 MG tablet Commonly known as: ATIVAN Take 1 tablet (1 mg total) by mouth every 4 (four) hours as needed for anxiety.   LORazepam 2 MG/ML concentrated solution Commonly known as: ATIVAN Place 0.5 mLs (1 mg total) under the tongue every 4 (four) hours as needed for anxiety.   morphine (PF) 2 MG/ML injection Inject 1 mL (2 mg total) into the  vein every hour as needed (dyspnea, RR>24).   morphine (PF) 2 MG/ML injection Inject 0.5 mLs (1 mg total) into the vein every 6 (six) hours.        Follow-up Information     [facility]. Schedule an appointment as soon as possible for a visit in 1 month(s).   Specialty: Neurology Why: stroke clinic Contact information: [address] [place] [phone]                Signed: Margaretha Seeds MD 10/11/2021, 4:09 PM

## 2021-10-11 NOTE — Progress Notes (Signed)
Cortrak Tube Team Note:  Consult received to place a Cortrak feeding tube.   Cortrak RD set-up and started placing tube. RN and palliative care NP came in during attempted placement and stated family does not want Cortrak tube. Cortrak placement aborted.   Please re-consult if change in poc and Cortrak is desired.    Kerman Passey MS, RDN, LDN, CNSC Registered Dietitian III Clinical Nutrition RD Pager and On-Call Pager Number Located in [place]

## 2021-10-12 LAB — CULTURE, BLOOD (ROUTINE X 2)
Culture: NO GROWTH
Culture: NO GROWTH
Special Requests: ADEQUATE
Special Requests: ADEQUATE

## 2021-10-17 ENCOUNTER — Ambulatory Visit: Payer: Medicare PPO | Admitting: Internal Medicine

## 2021-10-23 ENCOUNTER — Encounter: Payer: Medicare PPO | Admitting: Surgery

## 2021-11-18 DEATH — deceased
